# Patient Record
Sex: Male | Born: 1955 | Race: White | Hispanic: No | Marital: Married | Smoking: Never smoker
Health system: Southern US, Community
[De-identification: ages and names within clinical notes are randomized; demographics above are authoritative.]

## PROBLEM LIST (undated history)

## (undated) DIAGNOSIS — M1990 Unspecified osteoarthritis, unspecified site: Secondary | ICD-10-CM

## (undated) DIAGNOSIS — H9190 Unspecified hearing loss, unspecified ear: Secondary | ICD-10-CM

## (undated) DIAGNOSIS — E669 Obesity, unspecified: Secondary | ICD-10-CM

## (undated) DIAGNOSIS — K219 Gastro-esophageal reflux disease without esophagitis: Secondary | ICD-10-CM

## (undated) DIAGNOSIS — H269 Unspecified cataract: Secondary | ICD-10-CM

## (undated) DIAGNOSIS — D649 Anemia, unspecified: Secondary | ICD-10-CM

## (undated) DIAGNOSIS — E785 Hyperlipidemia, unspecified: Secondary | ICD-10-CM

## (undated) DIAGNOSIS — I10 Essential (primary) hypertension: Secondary | ICD-10-CM

## (undated) DIAGNOSIS — C801 Malignant (primary) neoplasm, unspecified: Secondary | ICD-10-CM

## (undated) DIAGNOSIS — R519 Headache, unspecified: Secondary | ICD-10-CM

## (undated) DIAGNOSIS — N200 Calculus of kidney: Secondary | ICD-10-CM

## (undated) DIAGNOSIS — I2510 Atherosclerotic heart disease of native coronary artery without angina pectoris: Secondary | ICD-10-CM

## (undated) HISTORY — DX: Unspecified osteoarthritis, unspecified site: M19.90

## (undated) HISTORY — DX: Hyperlipidemia, unspecified: E78.5

## (undated) HISTORY — DX: Atherosclerotic heart disease of native coronary artery without angina pectoris: I25.10

## (undated) HISTORY — DX: Unspecified cataract: H26.9

## (undated) HISTORY — DX: Obesity, unspecified: E66.9

## (undated) HISTORY — DX: Essential (primary) hypertension: I10

## (undated) HISTORY — PX: HERNIA REPAIR: SHX51

## (undated) HISTORY — DX: Anemia, unspecified: D64.9

## (undated) HISTORY — PX: OTHER SURGICAL HISTORY: SHX169

## (undated) HISTORY — PX: UPPER GASTROINTESTINAL ENDOSCOPY: SHX188

## (undated) HISTORY — PX: CARDIAC CATHETERIZATION: SHX172

## (undated) HISTORY — DX: Calculus of kidney: N20.0

---

## 1988-07-24 DIAGNOSIS — Z87442 Personal history of urinary calculi: Secondary | ICD-10-CM

## 1988-07-24 DIAGNOSIS — N200 Calculus of kidney: Secondary | ICD-10-CM

## 1988-07-24 HISTORY — DX: Personal history of urinary calculi: Z87.442

## 1988-07-24 HISTORY — DX: Calculus of kidney: N20.0

## 1998-07-20 ENCOUNTER — Encounter: Admission: RE | Admit: 1998-07-20 | Discharge: 1998-10-18 | Payer: Self-pay | Admitting: Internal Medicine

## 1998-11-02 ENCOUNTER — Encounter: Admission: RE | Admit: 1998-11-02 | Discharge: 1999-01-31 | Payer: Self-pay | Admitting: Internal Medicine

## 2005-07-24 HISTORY — PX: SHOULDER ARTHROSCOPY: SHX128

## 2006-02-05 ENCOUNTER — Ambulatory Visit: Payer: Self-pay

## 2006-02-14 ENCOUNTER — Ambulatory Visit: Payer: Self-pay | Admitting: Cardiology

## 2006-02-16 ENCOUNTER — Inpatient Hospital Stay (HOSPITAL_BASED_OUTPATIENT_CLINIC_OR_DEPARTMENT_OTHER): Admission: RE | Admit: 2006-02-16 | Discharge: 2006-02-16 | Payer: Self-pay | Admitting: Internal Medicine

## 2006-02-16 ENCOUNTER — Ambulatory Visit: Payer: Self-pay | Admitting: Internal Medicine

## 2006-02-16 ENCOUNTER — Inpatient Hospital Stay (HOSPITAL_COMMUNITY): Admission: AD | Admit: 2006-02-16 | Discharge: 2006-02-24 | Payer: Self-pay | Admitting: Internal Medicine

## 2006-02-19 HISTORY — PX: CORONARY ARTERY BYPASS GRAFT: SHX141

## 2006-03-06 ENCOUNTER — Ambulatory Visit: Payer: Self-pay | Admitting: Cardiology

## 2006-03-13 ENCOUNTER — Ambulatory Visit: Payer: Self-pay | Admitting: Cardiology

## 2006-04-06 ENCOUNTER — Ambulatory Visit: Payer: Self-pay | Admitting: Cardiology

## 2006-04-12 ENCOUNTER — Encounter (HOSPITAL_COMMUNITY): Admission: RE | Admit: 2006-04-12 | Discharge: 2006-07-11 | Payer: Self-pay | Admitting: Cardiology

## 2006-05-18 ENCOUNTER — Ambulatory Visit: Payer: Self-pay | Admitting: Cardiology

## 2006-06-06 ENCOUNTER — Ambulatory Visit: Payer: Self-pay | Admitting: Cardiology

## 2006-07-12 ENCOUNTER — Encounter (HOSPITAL_COMMUNITY): Admission: RE | Admit: 2006-07-12 | Discharge: 2006-07-20 | Payer: Self-pay | Admitting: Cardiology

## 2006-08-10 ENCOUNTER — Ambulatory Visit: Payer: Self-pay | Admitting: Cardiology

## 2006-08-10 LAB — CONVERTED CEMR LAB: Total CK: 98 units/L (ref 7–195)

## 2007-05-31 ENCOUNTER — Ambulatory Visit: Payer: Self-pay | Admitting: Cardiology

## 2007-06-15 IMAGING — CR DG CHEST 1V PORT
1 series · 1 of 1 positions shown · non-contrast
Comparison: 02/16/06.

CLINICAL DATA: CABG.  Chest tubes.
 PORTABLE CHEST -  SINGLE VIEW - 02/19/06:

[view not recorded]
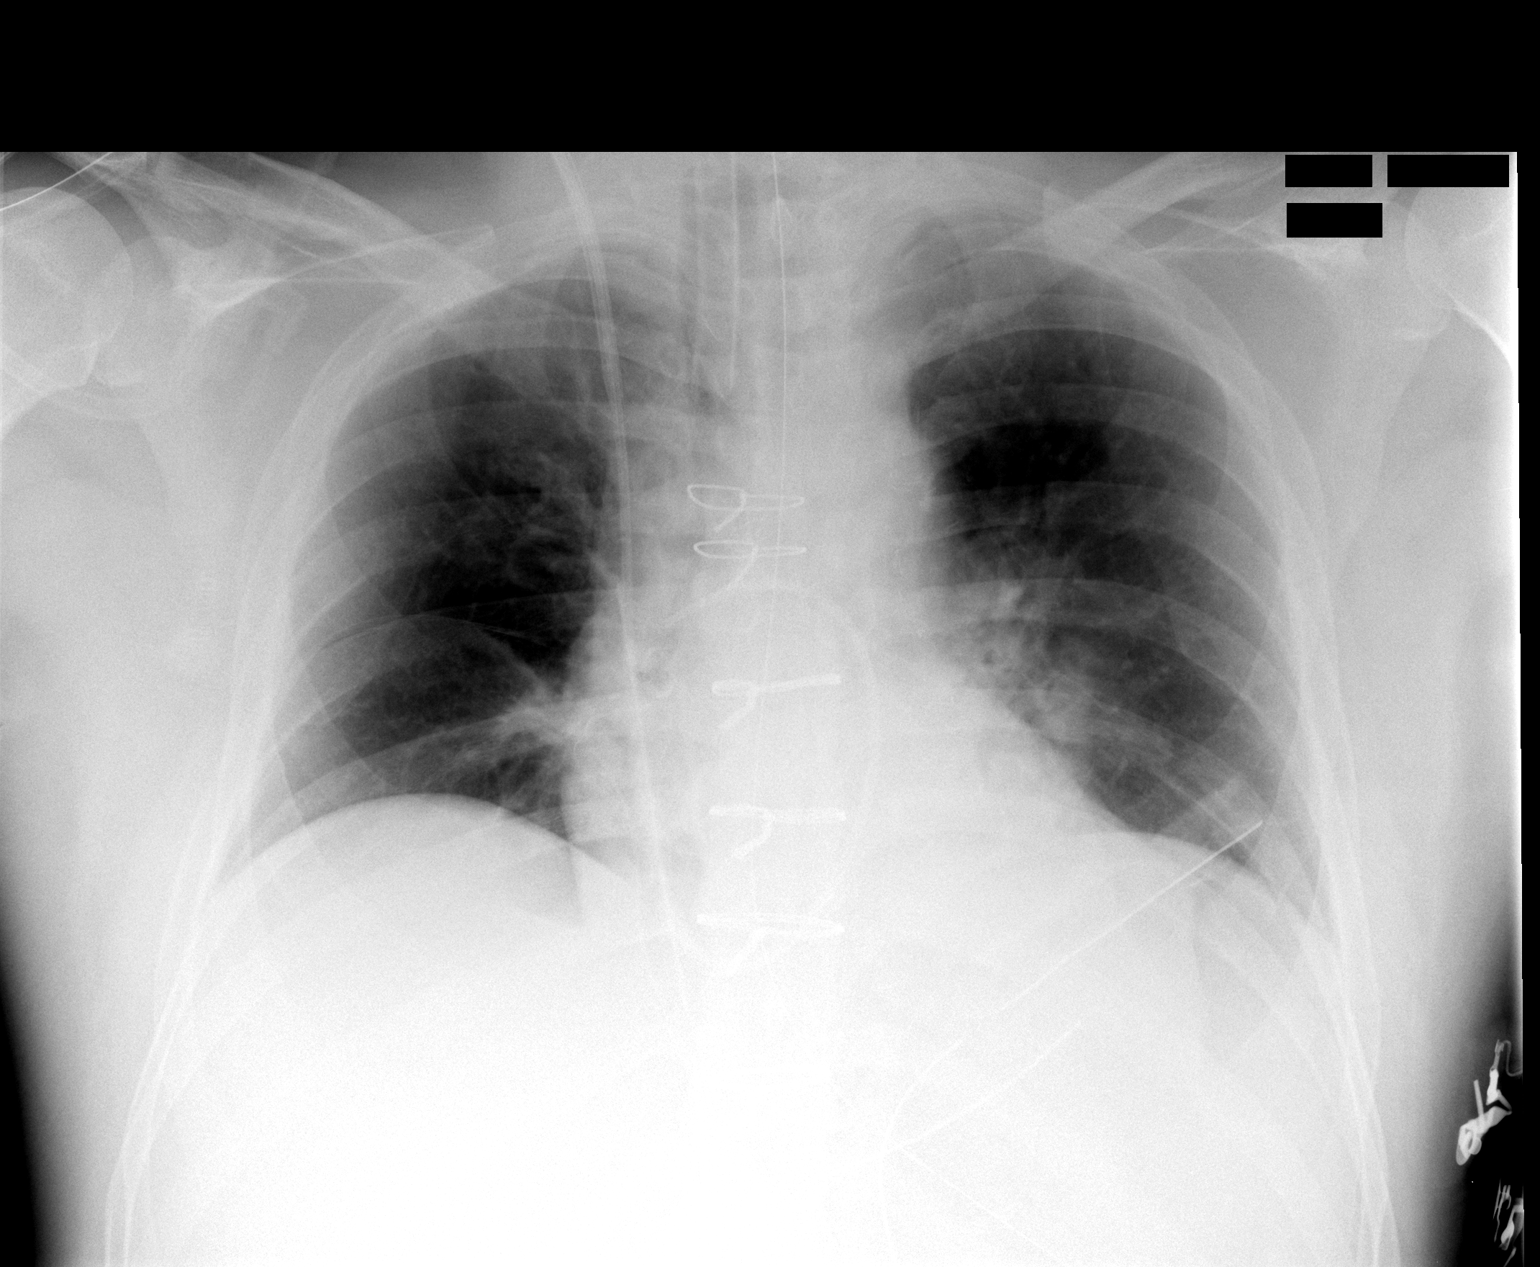

[1 of 1 positions shown; findings below may reference images not displayed]

FINDINGS: An endotracheal tube is in satisfactory position.  A nasogastric tube is followed into the stomach.  The right IJ Swan-Ganz catheter terminates in the right pulmonary artery.  A left chest tube and a mediastinal drain are in place.
 Low lung volumes accentuate the hila.  There is left base atelectasis.  No pneumothorax.
IMPRESSION: Low lung volumes with left base atelectasis.

## 2007-06-16 IMAGING — CR DG CHEST 1V PORT
1 series · 1 of 1 positions shown · non-contrast
Comparison: 02/19/06.

CLINICAL DATA: CABG.
PORTABLE CHEST - 1 VIEW, 02/20/06:

[view not recorded]
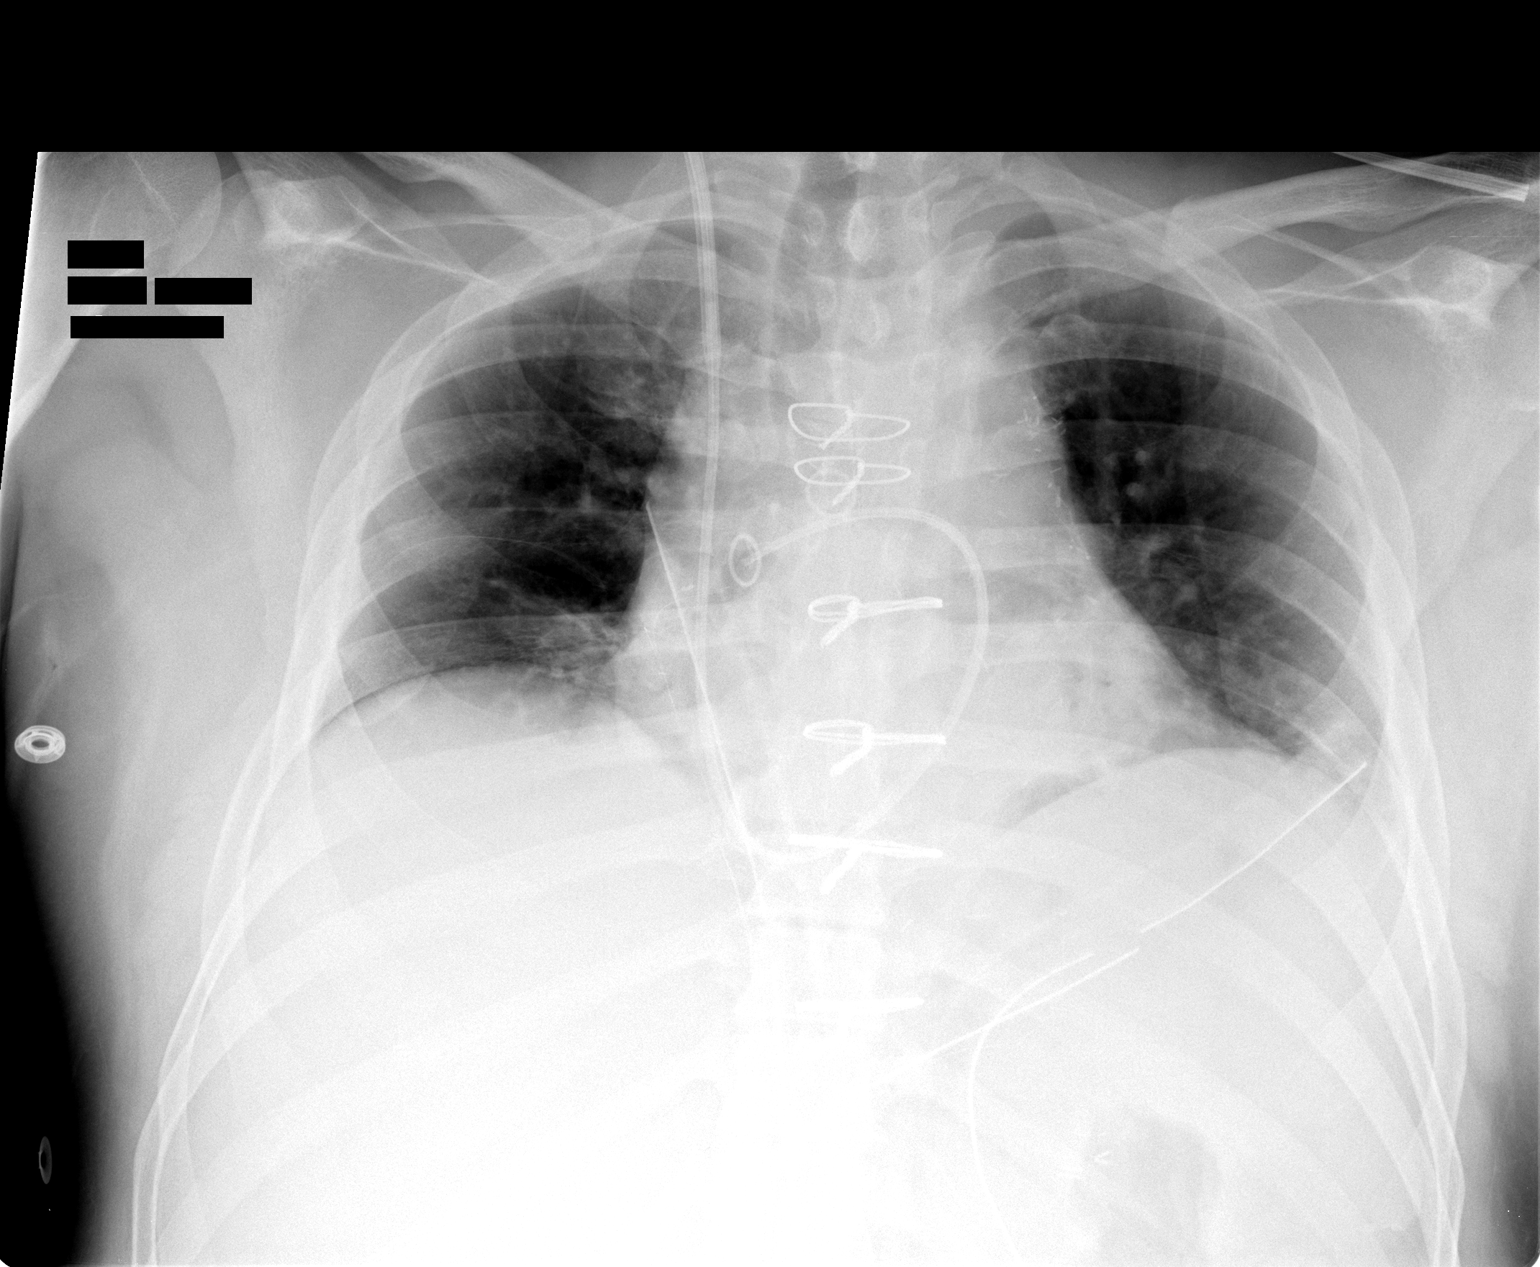

[1 of 1 positions shown; findings below may reference images not displayed]

FINDINGS: Support apparatus including the pulmonary artery catheter and mediastinal/chest tubes remain in place.  Endotracheal tube and nasogastric tube have been removed.  Patient continues to have low lung volumes without large pneumothorax.  The patient is status post median sternotomy.  Heart and mediastinum are stable.
IMPRESSION: Decreased lung volumes status post removal of endotracheal tube and nasogastric tube.

## 2007-06-17 IMAGING — CR DG CHEST 1V PORT
1 series · 1 of 1 positions shown · non-contrast
Comparison: 02/20/2006

CLINICAL DATA: Postop. Coronary artery disease.

[view not recorded]
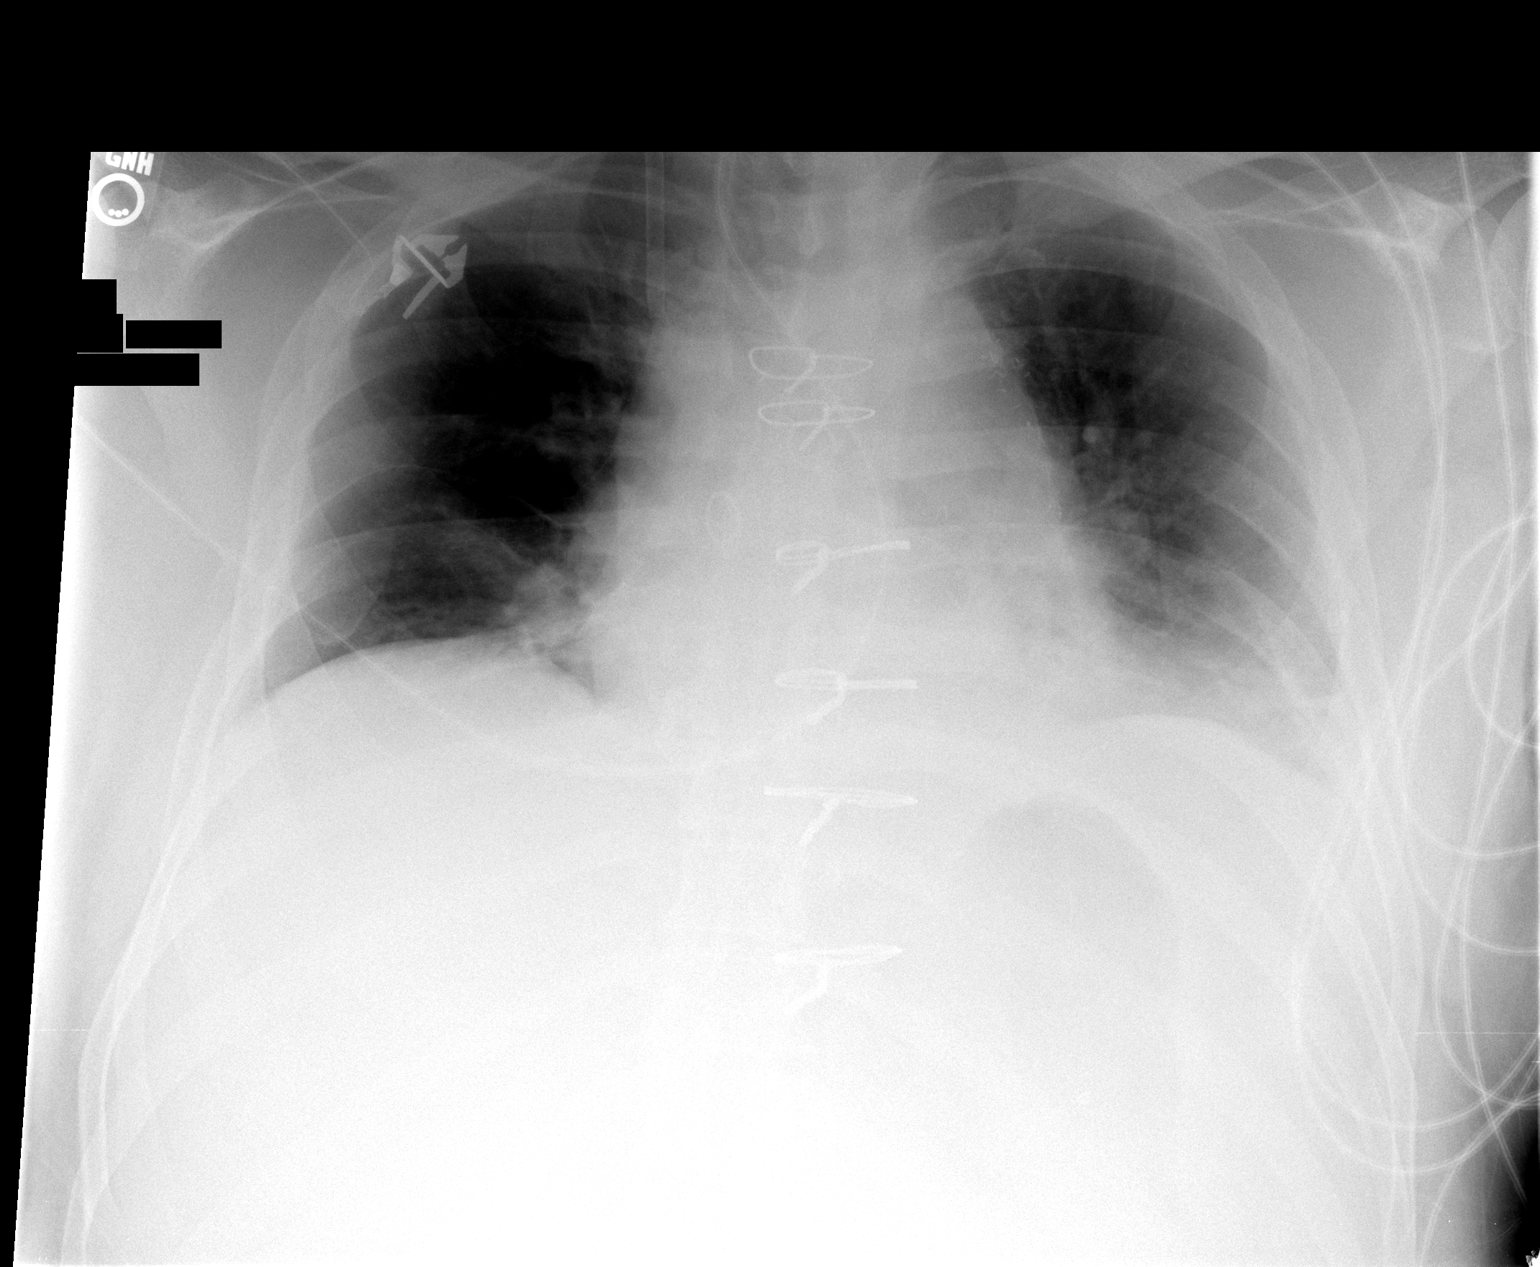

[1 of 1 positions shown; findings below may reference images not displayed]

PORTABLE CHEST - 1 VIEW:

0500 hours. Pulmonary artery catheter, chest tube, mediastinal/pericardial
drains have been removed in the interval. Right IJ sheath remains in place. Lung
volumes are low with bibasilar atelectasis, left greater than right. There may
be tiny left pleural effusion. No evidence for pneumothorax.
IMPRESSION: Low lung volumes with bibasilar atelectasis, left greater than right.

## 2007-07-01 ENCOUNTER — Ambulatory Visit: Payer: Self-pay

## 2008-06-01 ENCOUNTER — Ambulatory Visit: Payer: Self-pay | Admitting: Cardiology

## 2009-02-23 DIAGNOSIS — E669 Obesity, unspecified: Secondary | ICD-10-CM | POA: Insufficient documentation

## 2009-02-23 DIAGNOSIS — E1159 Type 2 diabetes mellitus with other circulatory complications: Secondary | ICD-10-CM | POA: Insufficient documentation

## 2009-02-23 DIAGNOSIS — I152 Hypertension secondary to endocrine disorders: Secondary | ICD-10-CM | POA: Insufficient documentation

## 2009-02-23 DIAGNOSIS — E1169 Type 2 diabetes mellitus with other specified complication: Secondary | ICD-10-CM | POA: Insufficient documentation

## 2009-02-23 DIAGNOSIS — I10 Essential (primary) hypertension: Secondary | ICD-10-CM | POA: Insufficient documentation

## 2009-02-23 DIAGNOSIS — E119 Type 2 diabetes mellitus without complications: Secondary | ICD-10-CM | POA: Insufficient documentation

## 2009-02-23 DIAGNOSIS — E785 Hyperlipidemia, unspecified: Secondary | ICD-10-CM | POA: Insufficient documentation

## 2009-10-14 ENCOUNTER — Ambulatory Visit: Payer: Self-pay | Admitting: Cardiology

## 2009-10-28 ENCOUNTER — Telehealth (INDEPENDENT_AMBULATORY_CARE_PROVIDER_SITE_OTHER): Payer: Self-pay | Admitting: *Deleted

## 2009-11-01 ENCOUNTER — Ambulatory Visit: Payer: Self-pay | Admitting: Internal Medicine

## 2009-11-01 ENCOUNTER — Encounter (HOSPITAL_COMMUNITY): Admission: RE | Admit: 2009-11-01 | Discharge: 2009-12-23 | Payer: Self-pay | Admitting: Cardiology

## 2009-11-01 ENCOUNTER — Ambulatory Visit: Payer: Self-pay

## 2010-08-25 NOTE — Progress Notes (Signed)
Summary: Nuclear Pre-Procedure  Phone Note Outgoing Call Call back at [facility] Phone [phone]   Call placed by: Stanton Kidney, EMT-P,  October 28, 2009 1:11 PM Action Taken: Phone Call Completed Summary of Call: Left message with information on Myoview Information Sheet (see scanned document for details).     Nuclear Med Background Indications for Stress Test: Evaluation for Ischemia, Graft Patency   History: CABG, Heart Catheterization, Myocardial Perfusion Study  History Comments: 7/07 Heart Cath: EF= 45-50%, 3V Dz > CABG x3 12/08 MPS: EF=64%, NL  Symptoms: DOE    Nuclear Pre-Procedure Cardiac Risk Factors: Hypertension, Lipids, NIDDM Height (in): 71

## 2010-08-25 NOTE — Assessment & Plan Note (Signed)
Summary: rov  Medications Added FENOFIBRATE 160 MG TABS (FENOFIBRATE) 1 tab once daily METFORMIN HCL 1000 MG TABS (METFORMIN HCL) 1 tab two times a day CRESTOR 10 MG TABS (ROSUVASTATIN CALCIUM) 1 tab 2 x weekly GLIPIZIDE 10 MG TABS (GLIPIZIDE) 1 tab once daily LISINOPRIL 10 MG TABS (LISINOPRIL) 1 tab once daily FISH OIL 1200 MG CAPS (OMEGA-3 FATTY ACIDS) 1 cap once daily ASPIRIN EC 325 MG TBEC (ASPIRIN) Take one tablet by mouth daily ACTOS 30 MG TABS (PIOGLITAZONE HCL) 1 tab once daily      Allergies Added: NKDA  Visit Type:  rov Primary Provider:  Janace Hoard  CC:  no cardiac complaints today.  History of Present Illness: The patient returns today for followup of his history of coronary artery disease, status post coronary bypass surgery in 2007, hypertension, hyperlipidemia, diabetes, and obesity.  He is now followed by Dr. Alwyn Ren. He gets his blood work checked about every 3 months. He states his lipids have been under good control. His hemoglobin A1c has been little bit high. I do not have any values today.  Other than some dyspnea on exertion, is having no ischemic symptoms. His last stress Myoview was in December of 2008.  He denies orthopnea, PND, palpitations or syncope.  Current Medications (verified): 1)  Fenofibrate 160 Mg Tabs (Fenofibrate) .Marland Kitchen.. 1 Tab Once Daily 2)  Metformin Hcl 1000 Mg Tabs (Metformin Hcl) .Marland Kitchen.. 1 Tab Two Times A Day 3)  Crestor 10 Mg Tabs (Rosuvastatin Calcium) .Marland Kitchen.. 1 Tab 2 X Weekly 4)  Glipizide 10 Mg Tabs (Glipizide) .Marland Kitchen.. 1 Tab Once Daily 5)  Lisinopril 10 Mg Tabs (Lisinopril) .Marland Kitchen.. 1 Tab Once Daily 6)  Fish Oil 1200 Mg Caps (Omega-3 Fatty Acids) .Marland Kitchen.. 1 Cap Once Daily 7)  Aspirin Ec 325 Mg Tbec (Aspirin) .... Take One Tablet By Mouth Daily 8)  Actos 30 Mg Tabs (Pioglitazone Hcl) .Marland Kitchen.. 1 Tab Once Daily  Allergies (verified): No Known Drug Allergies  Past History:  Past Medical History: Last updated: 10/07/2009 CAD, ARTERY BYPASS GRAFT/  02/19/06 (ICD-414.04) OBESITY (ICD-278.00) DM (ICD-250.00) HYPERLIPIDEMIA (ICD-272.4) HYPERTENSION (ICD-401.9)    Past Surgical History: Last updated: 03/16/2009  Hernia operation  Family History: Last updated: 03/16/09 His father died at age 41, he is not sure of what.  Mother  is alive at age 15 with hypertension.  He has no siblings.  Social History: Last updated: 03-16-2009 He lives in [place].  He is married.  He has  three  children.  He works part time in [employer] ge[employer].  Denies any tobacco, alcohol or  drugs and he is not routinely exercising.  Review of Systems       negative other than history of present illness  Vital Signs:  Patient profile:   55 year old male Height:      71 inches Weight:      228 pounds BMI:     31.91 Pulse rate:   79 / minute Pulse rhythm:   irregular BP sitting:   114 / 60  (left arm) Cuff size:   large  Vitals Entered By: Danielle Rankin, CMA (October 14, 2009 11:50 AM)  Physical Exam  General:  obese.  obese.   Head:  normocephalic and atraumatic Eyes:  PERRLA/EOM intact; conjunctiva and lids normal. Chest Kean Gautreau:  no deformities or breast masses noted Lungs:  Clear bilaterally to auscultation and percussion. Heart:  Non-displaced PMI, chest non-tender; regular rate and rhythm, S1, S2 without murmurs, rubs or gallops. Carotid upstroke normal, no  bruit. Normal abdominal aortic size, no bruits. Femorals normal pulses, no bruits. Pedals normal pulses. No edema, no varicosities. Abdomen:  Bowel sounds positive; abdomen soft and non-tender without masses, organomegaly, or hernias noted. No hepatosplenomegaly. Msk:  Back normal, normal gait. Muscle strength and tone normal. Pulses:  pulses normal in all 4 extremities Extremities:  left forearm keloid from radial graft harvestingtrace left pedal edema and trace right pedal edema.  trace left pedal edema.   Neurologic:  Alert and oriented x 3. Skin:  Intact without lesions or rashes. Psych:  Normal  affect.   EKG  Procedure date:  10/14/2009  Findings:      nsinus rhythm, nonspecific changes, early R wave progression  Impression & Recommendations:  Problem # 1:  CAD, ARTERY BYPASS GRAFT/ 02/19/06 (ICD-414.04)  He Has not had any objective assessment of his coronary disease since December of 2008. He is now almost 4 years out from bypass surgery. We'll arrange surveillance exercise Myoview. His updated medication list for this problem includes:    Lisinopril 10 Mg Tabs (Lisinopril) .Marland Kitchen... 1 tab once daily    Aspirin Ec 325 Mg Tbec (Aspirin) .Marland Kitchen... Take one tablet by mouth daily  Orders: EKG w/ Interpretation (93000) Nuclear Stress Test (Nuc Stress Test)  Problem # 2:  OBESITY (ICD-278.00) Assessment: Deteriorated  Problem # 3:  DM (ICD-250.00)  His updated medication list for this problem includes:    Metformin Hcl 1000 Mg Tabs (Metformin hcl) .Marland Kitchen... 1 tab two times a day    Glipizide 10 Mg Tabs (Glipizide) .Marland Kitchen... 1 tab once daily    Lisinopril 10 Mg Tabs (Lisinopril) .Marland Kitchen... 1 tab once daily    Aspirin Ec 325 Mg Tbec (Aspirin) .Marland Kitchen... Take one tablet by mouth daily    Actos 30 Mg Tabs (Pioglitazone hcl) .Marland Kitchen... 1 tab once daily  Problem # 4:  HYPERLIPIDEMIA (ICD-272.4)  His updated medication list for this problem includes:    Fenofibrate 160 Mg Tabs (Fenofibrate) .Marland Kitchen... 1 tab once daily    Crestor 10 Mg Tabs (Rosuvastatin calcium) .Marland Kitchen... 1 tab 2 x weekly  Problem # 5:  HYPERTENSION (ICD-401.9) Assessment: Improved  His updated medication list for this problem includes:    Lisinopril 10 Mg Tabs (Lisinopril) .Marland Kitchen... 1 tab once daily    Aspirin Ec 325 Mg Tbec (Aspirin) .Marland Kitchen... Take one tablet by mouth daily  Patient Instructions: 1)  Your physician recommends that you schedule a follow-up appointment in: 2 YEARS 2)  Your physician recommends that you continue on your current medications as directed. Please refer to the Current Medication list given to you today. 3)  Your  physician has requested that you have an exercise stress myoview.  For further information please visit [email or web address]  Please follow instruction sheet, as given.

## 2010-08-25 NOTE — Assessment & Plan Note (Signed)
Summary: Cardiology Nuclear Study  Nuclear Med Background Indications for Stress Test: Evaluation for Ischemia, Graft Patency   History: CABG, Heart Catheterization, Myocardial Perfusion Study  History Comments: 7/07 Heart Cath: EF= 45-50%, 3V Dz > CABG x3 12/08 MPS: EF=64%, NL  Symptoms: DOE, Palpitations    Nuclear Pre-Procedure Cardiac Risk Factors: Hypertension, Lipids, NIDDM Caffeine/Decaff Intake: None NPO After: 8:00 AM Lungs: clear IV 0.9% NS with Angio Cath: 22g     IV Site: (L) Hand IV Started by: Irean Hong RN Chest Size (in) 44     Height (in): 71 Weight (lb): 226 BMI: 31.63 Tech Comments: The patient pulled hamstring (L) leg last week, changed to lexiscan due to unable to walk treadmill per patient. Patsy Edwards,RN.  Nuclear Med Study 1 or 2 day study:  1 day     Stress Test Type:  Eugenie Birks Reading MD:  Dietrich Pates, MD     Referring MD:  T.Wall Resting Radionuclide:  Technetium 19m Tetrofosmin     Resting Radionuclide Dose:  10.8 mCi  Stress Radionuclide:  Technetium 41m Tetrofosmin     Stress Radionuclide Dose:  33 mCi   Stress Protocol   Lexiscan: 0.4 mg   Stress Test Technologist:  Milana Na EMT-P     Nuclear Technologist:  Domenic Polite CNMT  Rest Procedure  Myocardial perfusion imaging was performed at rest 45 minutes following the intravenous administration of Myoview Technetium 65m Tetrofosmin.  Stress Procedure  The patient received IV Lexiscan 0.4 mg over 15-seconds.  Myoview injected at 30-seconds.  There were non specific changes, sob, and nausea with infusion.  Quantitative spect images were obtained after a 45 minute delay.  QPS Raw Data Images:  soft tissue (diaphragam) underlies heart. Stress Images:  There is normal uptake in all areas. Rest Images:  Normal homogeneous uptake in all areas of the myocardium. Subtraction (SDS):  No evidence of ischemia. Transient Ischemic Dilatation:  1.10  (Normal <1.22)  Lung/Heart Ratio:   .34  (Normal <0.45)  Quantitative Gated Spect Images QGS EDV:  92 ml QGS ESV:  34 ml QGS EF:  63 %   Overall Impression  Exercise Capacity: Lexiscan protocol. BP Response: Normal blood pressure response. Clinical Symptoms: No chest pain ECG Impression: No significant ST segment change suggestive of ischemia. Overall Impression: Normal stress nuclear study.  Appended Document: Cardiology Nuclear Study good. no change in treatment.  Appended Document: Cardiology Nuclear Study PT AWARE./CY

## 2010-12-06 NOTE — Assessment & Plan Note (Signed)
[facility]                            CARDIOLOGY OFFICE NOTE   ADEMIDE, SCHABERG              MRN:          [id number]  DATE:05/31/2007                            DOB:          02-07-1956    The patient returns today for management of the following issues:   1. Coronary artery disease status post coronary artery bypass grafting      x3 February 19, 2006.  He had left internal mammary graft to the LAD,      left radial artery graft to the PDA and a vein graft placed to a      first diagonal.  He has normal left ventricular function.  He      presented with dyspnea on exertion and some chest tightness.  He      has a positive stress Myoview preoperatively.  2. Hypertension.  3. Hyperlipidemia, now on both Lipitor and TriCor for elevated      triglycerides.  4. Obesity.  His weight has increased from 206 to 228.  5. Type 2 diabetes.  His Hemoglobin A1C was last 6.8%.   He is having no angina or ischemic symptoms at present.   CURRENT MEDICATIONS:  Lisinopril 20 mg daily, Metformin 500 mg p.o.  b.i.d., a multivitamin daily, glipizide 10 mg daily, aspirin 325 mg  daily, Lipitor 20 mg p.o. q.h.s., TriCor 145 mg daily, Omega-3 fish oil  daily.   EXAMINATION:  His blood pressure is 120/77, pulse is 86 and regular.  HEENT:  Normocephalic, atraumatic. PERLA, extraocular movements are  intact, sclerae clear. Facial asymmetry is normal.  NECK:  Carotid upstrokes are equal bilaterally without bruits, no JVD.  Thyroid is not enlarged. Trachea is midline.  LUNGS:  Clear.  HEART:  Reveals a soft S1, S2, PMI could not be appreciated.  ABDOMINAL EXAM:  Soft with good bowel sounds.  EXTREMITIES:  Revealed no clubbing, cyanosis or edema. Pulses are  intact.   Electrocardiogram shows normal sinus rhythm with nonspecific ST segment  changes.   ASSESSMENT:  The patient is doing well except for his weight gain. Will  set him up for an exercise stress-test  Myoview as well as renew his  Lipitor.  He will have fasting lipids and LFTs through [facility] now that TriCor has been added to his program.   I will plan on seeing him back in a year.     Azion C. Daleen Squibb, MD, [facility]  Electronically Signed    TCW/MedQ  DD: 05/31/2007  DT: 06/01/2007  Job #: [id number]   cc:   Onalee Hua L. Reed Breech, M.D.  Peyton Najjar, MD

## 2010-12-06 NOTE — Assessment & Plan Note (Signed)
[facility]                            CARDIOLOGY OFFICE NOTE   ELIESER, TETRICK              MRN:          [id number]  DATE:06/01/2008                            DOB:          [birth year]    The patient returns today for further management of his coronary  disease.  Please see my note from May 31, 2007, for complete problem  list.   He is having no symptoms of angina or ischemic equivalents.   Recent blood work showed his lipids to be remarkable for total  cholesterol of 129, triglycerides of 291, HDL of 24, VLDL of 58, LDL 47,  hemoglobin A1c was 7.9%.  He is on a combination of Lipitor and Tricor.  He also takes fish oil.   His weight is unchanged.  He has gained 29 pounds actually 19 pounds  since November 2007.   CURRENT MEDICATIONS:  1. Lisinopril 20 mg a day.  2. Glipizide 10 mg a day.  3. Aspirin 325 mg a day.  4. Lipitor 20 mg nightly.  5. TriCor 145 mg a day.  6. Omega 3 fish oil daily.  7. Metformin 1000 mg b.i.d.   PHYSICAL EXAMINATION:  VITAL SIGNS:  His blood pressure today is 108/66,  his pulse is 83 and regular, his weight is 229.  HEENT:  Normal.  Carotid upstrokes are equal bilaterally without bruits.  No JVD.  Thyroid is not enlarged.  Trachea is midline.  LUNGS:  Clear to  auscultation and percussion.  HEART:  Nondisplaced PMI.  Normal S1 and S2.  ABDOMEN:  Soft.  Good bowel sounds.  No midline bruit.  No hepatomegaly.  EXTREMITIES:  No cyanosis, clubbing, or edema.  Pulses are intact.  NEUROLOGIC:  Intact.   His EKG is completely normal.   ASSESSMENT AND PLAN:  Mr. Debera Lat Asumendi is doing well.  He really  needs to drop about 25 pounds and watch his carbohydrate load.  Hopefully, this numbers would significantly improve including his  hemoglobin A1c.   I have made no changes in medical program.  Again, therapeutic lifestyle  choices are in order.   I will see him back in a year.  At that time,  he will need objective  assessments of his coronary disease with a stress nuclear study.     Alfonse C. Daleen Squibb, MD, [facility]  Electronically Signed    TCW/MedQ  DD: 06/01/2008  DT: 06/02/2008  Job #: [id number]   cc:   Peyton Najjar, MD

## 2010-12-09 NOTE — Op Note (Signed)
NAMEERSEL, [name] NO.:  [id number]   MEDICAL RECORD NO.:  [id number]          PATIENT TYPE:  INP   LOCATION:  2019                         FACILITY:  [facility]   PHYSICIAN:  Salvatore Decent. 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   PROCEDURE:  Median sternotomy, extracorporeal circulation, coronary bypass  grafting x3 (left internal mammary artery to LAD, left radial artery to  posterior descending, saphenous vein graft to first diagonal), endoscopic  vein harvest right leg.   SURGEON:  Salvatore Decent. Dorris Fetch, M.D.   ASSISTANT:  Evelene Croon, M.D.   SECOND ASSISTANT:  Pecola Leisure, P.A.-C.   ANESTHESIA:  General.   FINDINGS:  Good quality conduits.  The PL too small to graft where it was  visible but flow from the posterior descending graft seen in the small  branches of the posterior lateral with flushing of the radial graft, good  quality conduits, good quality targets.   CLINICAL NOTE:  [name] is a 55 year old gentleman who presents  with new onset angina.  He had a positive exercise Myoview study and at  catheterization had severe two vessel disease not favorable for percutaneous  intervention.  The patient was offered the option of coronary bypass  grafting for revascularization. The indications, risks, benefits and  alternatives were discussed in detail with the patient who understood and  accepted the risks and agreed to proceed. We did discuss using the left  radial artery for a conduit given his young age. 55  He is not a good candidate  for bilateral mammaries due to his diabetes. The patient accepted the  additional risk of radial artery harvest.   OPERATIVE  NOTE:  [name] was brought to the preop holding area  on February 19, 2006.  There, the anesthesia service placed lines for monitoring  arterial, central venous, and pulmonary arterial pressure.  ECG leads were  placed for continuous telemetry.  Intravenous antibiotics were administered.  The patient was taken to the operating room, anesthetized, and intubated.  A  Foley catheter was placed.  The chest, abdomen, legs, and left arm were  prepped and draped in the usual fashion.  An incision was made in the medial  aspect of the right leg at the level of the knee. The greater saphenous vein  was identified and a short segment was harvested from the knee to the mid  thigh.  The saphenous vein was of good quality.  Simultaneously, an incision  made over the radial artery at the volar aspect of the left wrist.  A short  incision was made initially, this was carried through the skin and  subcutaneous tissue.  The radial artery was mobilized using the harmonic  scalpel. A soft bulldog clamp was placed across the radial artery.  Initially, there was diminished pulse distally but with applying papaverine  there was an excellent pulse distally confirming  the preoperative Allen's  test which had been normal on two occasions preoperatively. The incision was  extended to just below the antecubital fossa and the radial artery was  harvested using open technique using the harmonic scalpel.  The radial  artery was a good quality conduit.  5000 units of heparin was administered  during the harvest of the vessels. After exposing and mobilizing the radial  artery along the length of the incision, the radial artery was divided  distally. There was good back bleeding from the distal stump.  It was suture  ligated with a 4-0 Prolene suture.  The vessel also was divided proximally  and the stump, likewise, was ligated with a 4-0 Prolene suture.   The leg and radial artery incisions were closed  simultaneously while proceed  with the median sternotomy. A median sternotomy was performed and the left  internal mammary artery was harvested using standard technique. The mammary  artery was a good quality vessel.  The remainder of the full heparin dose  was given prior to opening the pericardium.   The pericardium was opened.  The ascending aorta was inspected and there was  no evidence of atherosclerotic disease.  The aorta was cannulated via  concentric 2-0 Ethibond non-pledgeted pursestring sutures.  A dual stage  venous cannula was placed via pursestring suture in the right atrial  appendage.  Cardiopulmonary bypass was instituted and the patient was cooled  to 32 degrees Celsius.  The coronary arteries were inspected and anastomotic  sites were chosen.  The posterior lateral branch was too small to graft  where it was visible.  It bifurcated into two very small terminal vessels.  There was disease in the right coronary artery proximal to the bifurcation  of the posterior descending but no significant disease distally. The  conduits were inspected and cut to length. A foam pad was placed in the  pericardium to protect the left phrenic nerve.  A temperature probe was  placed in the myocardial septum.  The cardioplegic cannula was placed in the  ascending aorta.   The aorta was crossclamped, the left ventricle was emptied via the aortic  root vent.  Cardiac arrest was achieved with a combination of cold antegrade  blood cardioplegia and topical iced saline.  After achieving a complete  diastolic arrest and adequate myocardial septal cooling, the following  distal anastomoses were performed.   First, a reverse saphenous vein graft was placed end-to-side to the first  diagonal branch of the LAD.  This was a 1.5 mm target and the vein graft was  of good quality.  The anastomosis was performed with a running 7-0 Prolene suture.  There was excellent flow through this graft.   Cardioplegia was  administered.  There was good hemostasis.   Next, the left radial artery was spatulated and its distal end was  anastomosed end-to-side to the posterior descending.  The posterior  descending was a 1.5 mm vessel.  There was some plaquing in the vessel but a  probe did pass distally.  The anastomosis was performed with a running 8-0  Prolene suture.  It probed easily proximally and distally at its completion.  The graft was carefully flushed with heparinized saline and there was flow  visible both within the posterior descending as well as in the terminal  small branches of the posterior lateral branch.   Next, the left internal mammary artery was brought through a window in the  pericardium.  The distal end was  spatulated.  It was anastomosed end-to-side  to the distal LAD.  The LAD was a 1.5 mm good-quality target.  The mammary  was a good quality conduit.  The anastomosis was performed with a running 8-  0 Prolene suture. At the completion of the mammary to LAD anastomosis, a  probe passed easily in all directions. The bulldog clamp was briefly  removed.  Immediate and rapid septal rewarming was noted.  The bulldog clamp  was replaced.  The mammary pedicle was tacked to the epicardial surface of  the heart with 6-0 Prolene sutures.   Additional cardioplegia was administered via the aortic root.  The radial  artery and vein graft were cut to length.  The proximal anastomoses then  were performed.  The radial artery was performed first with a running 7-0  Prolene suture.  Next, the saphenous vein proximal was performed with  running 6-0 Prolene suture. At the completion of this anastomosis, the  patient was placed in Trendelenburg position.  The bulldog clamp was again  removed from the left mammary artery.  Lidocaine was administered.  The  aortic root was de-aired and the aortic crossclamp was removed.  The total  crossclamp time was 58 minutes.   While the patient  was being rewarmed, all proximal and distal anastomoses  were inspected for hemostasis.  Epicardial pacing wires were placed on the  right ventricle and right atrium.  When the patient had rewarmed to a core  temperature of 37 degrees Celsius, he was weaned from cardiopulmonary bypass  without difficulty.  The total bypass time was 106 minutes.  The initial  cardiac index was greater than 2 liters per minute per meter squared and the  patient remained hemodynamically stable throughout the post bypass period.   A test dose of protamine was administered and was well tolerated.  The  atrial and aortic cannulae were removed.  The remainder of the protamine was  administered without incident.  The chest was irrigated with 1 liter of  normal saline containing 1 gram of vancomycin.  Hemostasis was achieved.  A  left pleural and two mediastinal chest tubes were placed through separate subcostal incisions.  The pericardium was reapproximated with interrupted 3-  0 silk sutures.  This came together easily without tension. The sternum was  closed with heavy gauge stainless steel wires.  The remainder of the  incision was closed in standard fashion.  Subcuticular closure was used for  all skin incisions.  All sponge, needle and instrument counts were correct  at the end of the procedure.  The patient was taken from the operating room  to the surgical intensive care unit in critical but stable condition.           ______________________________  Salvatore Decent Dorris Fetch, M.D.     SCH/MEDQ  D:  02/21/2006  T:  02/21/2006  Job:  [id number]   cc:   Arvilla Meres, M.D. [facility] D. Riley Kill, M.D. [facility]  Maisie Fus C. Wall, M.D.  Kinnie Scales. Reed Breech, M.D.

## 2010-12-09 NOTE — Discharge Summary (Signed)
NAMEARMISTEAD, SULT NO.:  [id number]   MEDICAL RECORD NO.:  [id number]          PATIENT TYPE:  INP   LOCATION:  2019                         FACILITY:  [facility]   PHYSICIAN:  Salvatore Decent. Dorris Fetch, M.D.DATE OF BIRTH:  Jul 19, 1956   DATE OF ADMISSION:  02/16/2006  DATE OF DISCHARGE:                                 DISCHARGE SUMMARY   ADMISSION DIAGNOSIS:  Coronary artery disease.   PAST MEDICAL HISTORY AND DISCHARGE DIAGNOSES:  1.  Hypertension.  2.  Hyperlipidemia.  3.  Type 2 diabetes mellitus.  4.  Hernia operation when he was approximately 55 years old.  5.  Obesity.  6.  Coronary artery disease, status post coronary artery bypass grafting x3.   ALLERGIES:  NO KNOWN DRUG ALLERGIES.   BRIEF HISTORY:  The patient is a 55 year old Caucasian male with a previous  history of coronary artery disease who reported to his primary care  physician and then to his cardiologist with complaint of a 24-month history  of exertional chest tightness associated with mild shortness of breath.  After evaluation by the cardiologist, he underwent an exercise Myoview on  February 05, 2006, which showed significant ST-segment changes, as well as  inferior and inferobasal ischemia.  He was then subsequently set up for a  cardiac cath.   HOSPITAL COURSE:  Patient was admitted and taken for cardiac cath on February 16, 2006, by Dr. Gala Romney.  The patient was found to have severe 2-vessel  coronary artery disease with a good left ventricular function.  He was not a  candidate for percutaneous intervention; therefore, Dr. Charlett Lango  of the CVTS Service was consulted regarding surgical revascularization.  Dr.  Dorris Fetch evaluated the patient on February 16, 2006, and it was his opinion  that patient should proceed with coronary artery bypass grafting.  The  patient remained in stable condition on routine hospital care until his  surgery could be scheduled.   The patient was taken to  the OR on February 19, 2006, for coronary artery bypass  grafting x3.  Endoscopic vessel harvesting was performed on the right thigh  from the knee to the mid thigh only.  The left internal mammary artery was  grafted to the LAD, the left radial artery was grafted to the PDA, and  saphenous vein was grafted to the first diagonal.  The patient tolerated the  procedure, was hemodynamically stable immediately postoperatively.  The  patient was transferred from the OR to the SICU in stable condition.  Patient was extubated without complication.  Woke up from anesthesia  neurologically intact.   Patient's postoperative was progressed as expected.  On postoperative day  one, his only complaint was of incisional pain.  He was started on Imdur  secondary to his radial artery graft.  All the patient's lines and chest  tubes were discontinued in a routine manner, and he tolerated this well.  His diabetes mellitus has been adequately maintained throughout the  postoperative course with Lantus, sliding scale, and his p.o. medications  were restarted.  These will need to be followed closely as an outpatient by  his primary care physician.   The  patient has been volume overloaded postoperatively and is being diuresed  accordingly.  He began cardiac rehab on postoperative day 2 and has  continued to increase his tolerance to a satisfactory level at this time.  On postoperative day 3, the patient is without complaint.  He ran a low-  grade temperature early in the a.m. and this will be monitored over night.  His vital signs are, otherwise, stable.  He is intermittently in a sinus  rhythm and a sinus tachycardia with a maximum rate of 110.  On physical  exam, the incisions are clean, dry and intact with no erythema.  Cardiac is  regular rate and rhythm.  The lungs revealed decreased breath sounds in the  bases.  There is no edema present in the bilateral lower extremities and the  left arm and hand are  adequately perfused.  The patient is in stable  condition at this time and as long as he continues to progress in the  current manner and remains afebrile overnight, he will be ready for  discharge within the next 1 to 2 days pending morning round reevaluation.   LABS:  CBC and CMP on February 22, 2006:  White count 9.1, hemoglobin 10.4,  hematocrit 30.5, platelets 140, sodium 136, potassium 3.7, BUN 12,  creatinine 1.0, glucose 201.   CONDITION ON DISCHARGE:  Is improved.   INSTRUCTIONS:  1.  Medications:      1.  Aspirin 325 mg p.o. q. day.      2.  Toprol-XL 25 mg q. day.      3.  Lisinopril 20 mg q. day.      4.  Lipitor 80 mg q. day.      5.  Imdur 30 mg q. day.      6.  Lasix 40 mg q. day x5 days.      7.  K-Dur 20 mEq q. day x5 days.      8.  Metformin 1,000 mg q. day.          1.  Tylox one to two q.4 to 6 hours p.r.n. pain.  2.  Activity:  No driving and no lifting of more than 10 pounds x3 weeks,      and the patient should continue the daily running and walking exercises.  3.  Diet:  Low-salt, low-fat, with carbohydrate-modified, medium-calorie      restrictions.  4.  Wound care:  The patient may shower daily and clean his incisions with      soap and water.  If wound problems arise patient should contact the CVTS      office.  5.  Followup appointment with Dr. Daleen Squibb 2 weeks after discharge.  [facility] will be responsible for establishing the date and time of      this appointment.  6.  With Dr. Dorris Fetch on March 19, 2006, at 12:30 p.m.  The patient will      be instructed to bring the chest x-ray from the appointment with Dr.      Daleen Squibb to the appointment with Dr. Dorris Fetch.      Pecola Leisure, PA    ______________________________  Salvatore Decent Dorris Fetch, M.D.    AY/MEDQ  D:  02/22/2006  T:  02/22/2006  Job:  [id number]   cc:   Kamarion C. Wall, M.D.  Kinnie Scales. Reed Breech, M.D.

## 2010-12-09 NOTE — Assessment & Plan Note (Signed)
[facility]                              CARDIOLOGY OFFICE NOTE   SANJITH, SIWEK              MRN:          [id number]  DATE:02/14/2006                            DOB:          Oct 09, 1955    PRIMARY CARE PHYSICIAN:  Kinnie Scales. Reed Breech, M.D.   PRIMARY CARDIOLOGIST:  Jesse Sans. Wall, MD, FACC   PATIENT PROFILE:  This is a 55 year old white male with recent history of  exertional angina and abnormal functional study presents for pre cath  workup.   PROBLEMS:  1.  Onset angina.  1a.  On 02/05/2006, exercise Myoview, exercise time 11 minutes, maximal  heart rate of 133 beats-per-minute (78%).  Mets 13.2.  EKG showed  significant ST abnormalities consistent with ischemia and he also had  abnormal perfusion imaging revealing mild to moderate inferobasilar  ischemia.  1.  Hypertension.  2.  Hyperlipidemia.  3.  Type 2 diabetes mellitus diagnosed approximately five years ago.  4.  History of hernia operation when he was 55 years old.  5.  Obesity.   HISTORY OF PRESENT ILLNESS:  This is a 55 year old white male with no prior  history of CAD who reports a two month history of exertional 3/10 chest  tightness especially when walking up inclines associated with mild shortness  of breath without nausea or diaphoresis, lasting less than 5 minutes and  resolving with rest.  He saw his primary care physician, Dr. Reed Breech for  routine physical earlier this year and underwent an exercise Myoview on February 05, 2006 which, despite good exercise tolerance, showed significant ST  segment changes as well as inferior and inferobasal ischemia.  He was  subsequently set up for today's appointment so that he may undergo left  heart cardiac catheterization scheduled for this Friday.  He currently  denies any PND or orthopnea, dizziness, syncope or early satiety.   ALLERGIES:  NO KNOWN DRUG ALLERGIES.   HOME MEDICINES:  He is unclear on the doses but he is  taking Lisinopril,  metformin, aspirin 800 mg daily, multivitamin one daily and then supplements  for potassium, calcium, folic acid and cod liver oil.   FAMILY HISTORY:  His father died at age 18, he is not sure of what.  Mother  is alive at age 71 with hypertension.  He has no siblings.   SOCIAL HISTORY:  He lives in [place].  He is married.  He has  three  children.  He works part time in [employer] ge[employer].  Denies any tobacco, alcohol or  drugs and he is not routinely exercising.   REVIEW OF SYSTEMS:  Positive for exertional chest discomfort and shortness  of breath.  He has intermittent bouts of diarrhea, otherwise all systems  reviewed and negative.   PHYSICAL EXAMINATION:  VITAL SIGNS:  Blood pressure is 138/76, heart rate is  62, respirations 18.  He is 212 pounds, pleasant white male in no acute  distress, awake, alert, and oriented x3.  NECK:  Normal carotid upstrokes, no bruits or JVD.  LUNGS: Respirations are regular and unlabored.  Clear to auscultation.  CARDIAC:  Regular S1 and S2, no S3,  S4 or murmurs.  ABDOMEN:  Bowel sounds, soft and nontender, nondistended.  Bowel sounds  present x4.  EXTREMITIES:  Warm and dry, pink.  No clubbing, cyanosis or edema.  Dorsalis  pedis and posterior tibial pulses 2+ and equal bilaterally.  No femoral  bruits noted.   ACCESSORY CLINICAL FINDINGS:  CBC, BMET, PT/PTT and chest x-ray are pending.   ASSESSMENT AND PLAN:  1.  Unstable angina.  The patient had an abnormal functional study earlier      this month and is scheduled for cardiac catheterization on Friday.  He      remains on aspirin therapy.  Given his history of diabetes, he should      likely be initiated on Statin therapy as well.  We can discuss  this      further following his catheterization.  2.  Hypertension.  This is reasonably well controlled.  He was recently      started on lisinopril.  3.  Type 2 diabetes mellitus followed by his primary care physician.                                 Nicolasa Ducking, ANP    CB/MedQ  DD:  02/14/2006  DT:  02/14/2006  Job #:  [id number]

## 2010-12-09 NOTE — Cardiovascular Report (Signed)
NAME:  [name] Love, [name] Love NO.:  [id number]   MEDICAL RECORD NO.:  [id number]          PATIENT TYPE:  OIB   LOCATION:  1963                         FACILITY:  [facility]   PHYSICIAN:  Arvilla Meres, M.D. LHCDATE OF BIRTH:  1956-05-19   DATE OF PROCEDURE:  02/16/2006  DATE OF DISCHARGE:                              CARDIAC CATHETERIZATION   PRIMARY CARE PHYSICIAN:  Dr. Berton Bon.   CARDIOLOGIST:  Jesse Sans. Wall, M.D.   PATIENT INDICATIONS:  [name] Love is a 55 year old man with a  history of diabetes, hypertension and hyperlipidemia, who has a several-  month history of exertional angina with no resting symptoms.  He had a  Cardiolite which was suggestive of some inferior basilar hypokinesis and  ischemia.  He is thus referred for diagnostic catheterization in the  outpatient lab.   PROCEDURES PERFORMED:  1.  Selective coronary angiography.  2.  Left heart catheterization.  3.  Left ventriculogram.   DESCRIPTION OF PROCEDURE:  The risks and benefits of the catheterization  were explained.  Consent was signed and placed in the chart.  A 4-French  arterial sheath was placed in right femoral artery using a modified  Seldinger technique.  Standard catheters including JL-4, 3-D RC and angled  pigtail were used for the procedure.  All catheter exchanges were made over  wire.  There were no apparent complications.   Central aortic pressure 123/74 with mean of 95.  LV 107/3 with an EDP of 0.   The left main had distal 20% stenosis.   LAD was a long vessel wrapping the apex.  It gave off a single moderate-  sized diagonal in the proximal portion.  In the proximal portion of the LAD  there was 80% focal stenosis followed by 30-40% tubular stenosis.  There was  a mid 40% stenosis.  In the ostium of the first diagonal there was a 60-70%  focal stenosis with a 30% stenosis in the body of the diagonal.   Left circumflex a moderate-sized system and gave off a  small to moderate  ramus and a large branching OM-1.  There was a 60% lesion in the ostium of  the ramus, otherwise no significant angiographic disease.   Right coronary artery was a large, dominant system with a significant amount  of disease.  There was a 70-80% lesion proximally, a 40% the lesion in the  midsection.  In the distal RCA around the bend there was a high-grade 99%  lesion followed by an 80% lesion at the takeoff of the PDA.  There was  evidence of competitive flow from collaterals to the PDA and the second of  the three posterolaterals.   Left ventriculogram done in the RAO position showed an EF of 45-50% with  mild inferior basilar hypokinesis and mild anterior hypokinesis.   ASSESSMENT:  1.  Three-vessel coronary artery disease with high-grade lesions in the      proximal left anterior descending artery and multiple lesions in the      right coronary.  2.  Low normal ejection fraction.  3.  Stable exertional angina.   PLAN/DISCUSSION:  [name] Love has several lesions.  Obviously the  culprit lesion seems to be the distal right coronary artery.  Although this  is quite high-grade, he does have evidence of some collateral flow with  stable symptoms.  I will review the case with Dr. Riley Kill for possible PCI  of the RCA and the LAD in a staged procedure.  We will also discuss the  timing, whether not we need to admit him today or can bring him back early  next week.  I think either approach would be reasonable given his stable  symptoms and his evidence of collateralization.      Arvilla Meres, M.D. [facility]  Electronically Signed     DB/MEDQ  D:  02/16/2006  T:  02/16/2006  Job:  [id number]

## 2010-12-09 NOTE — Assessment & Plan Note (Signed)
[facility]                              CARDIOLOGY OFFICE NOTE   Love, [name]              MRN:          [id number]  DATE:03/06/2006                            DOB:          24-Apr-1956    [name] returns today for further management of his coronary artery  disease.   His problem list includes:  1. Two vessel coronary artery disease, status post coronary bypass      grafting times three, February 19, 2006.  At that time he had a left      internal mammary graft placed to the LAD, left radial artery placed to      the PDA, and a vein graft placed to a first diagonal.  He had normal      left ventricular systolic function prior to surgery.  2. Hypertension.  3. Hyperlipidemia with Lipitor being a new drug.  4. Mild obesity.   He has done remarkably well.  Other than some sternal tenderness, he has no  complaints.   His current meds are:  1. Lisinopril 20 mg a day.  2. Metformin 500 b.i.d.  3. Multivitamin q. day.  4. Potassium over-the-counter.  5. Isosorbide 30 mg a day.  6. Glipizide 10 mg a day.  7. Lipitor 80 mg a day.  8. Metoprolol 25 mg a day.  9. Aspirin 325 a day.   His blood pressure today is 130/64, his pulse is 80 and regular.  EKG is  normal except for some nonspecific ST segment T-wave changes.  Weight is  207.  He is in no acute distress.  Skin is warm and dry.  Sclerae are clear.  Extraocular movements intact.  PERRLA.  Facial symmetry  is normal.  Dentition is satisfactory.  Carotid upstrokes are equal bilaterally without bruits.  No JVD.  Thyroid is  not enlarged.  Trachea is midline.  Lungs are clear to auscultation.  Sternotomy site is stable.  Heart reveals a regular rate and rhythm without murmurs, rubs or gallops.  Abdominal exam is soft with good bowel sounds.  There is no midline bruit.  There is no hepatomegaly.  Extremities reveals no cyanosis, clubbing or edema.  Pulses are intact.   Chest  x-ray is pending.   ASSESSMENT AND PLAN:  [name] is doing remarkably well.  He seems to be  very compliant and willing to do everything he can to practice strict TLC.   PLAN:  1. Discontinue metoprolol.  2. Discontinue isosorbide.  3. Discontinue over-the-counter potassium.  4. Follow up blood work, including lipids, in four weeks.  5. Follow up with me in two months.                               Kota C. Daleen Squibb, MD, [facility]    TCW/MedQ  DD:  03/06/2006  DT:  03/06/2006  Job #:  [id number]   cc:   Onalee Hua L. Reed Breech, MD  Salvatore Decent Dorris Fetch, MD

## 2010-12-09 NOTE — Assessment & Plan Note (Signed)
[facility]                              CARDIOLOGY OFFICE NOTE   TUSHAR, ENNS              MRN:          [id number]  DATE:06/06/2006                            DOB:          October 08, 1955    Patient returns today for further management of his coronary artery disease.   PROBLEM LIST:  1. Coronary artery disease.  He had a positive stress nuclear study with      exertional angina on February 05, 2006.  Cardiac catheterization showed      severe three vessel disease.  He underwent coronary artery bypass      grafting by Dr. Andrey Spearman.  The mammary was placed at LAD, left      radial artery to the PDA, and a vein graft to a first diagonal.  He had      normal left ventricular function.  2. Hypertension.  3. Diabetes.  4. Obesity.  5. Mixed hyperlipidemia.   He is in cardiac rehab.  He has become very serious about his health.  He  has lost about 15 pounds.  His last lipid showed a total cholesterol of 107,  HDL 38, LDL 44, triglycerides 188 with normal LFTs.  We reduced his Lipitor  from 40 to 20.  He is due blood work again in three months.   He is due a followup with the primary care at [facility] for his  diabetes and blood work in general.   CURRENT MEDICATIONS:  1. Lisinopril 20 mg a day.  2. Metformin 500 mg b.i.d.  3. Multivitamin daily.  4. Glipizide 10 mg a day.  5. Aspirin 325 a day.  6. Lipitor 20 mg p.o. nightly.   PHYSICAL EXAMINATION:  VITAL SIGNS:  His blood pressure today is 102/68 in  both arms.  Pulse is 82 and regular.  His weight is 206.  GENERAL:  He is slightly pale.  HEENT:  Normocephalic and atraumatic.  PERRLA.  Extraocular movements are  intact.  Sclerae are clear.  Facial asymmetry is normal.  NECK:  Carotids are equal bilaterally without bruits.  There is no JVD.  Thyroid is not enlarged.  Trachea is midline.  LUNGS:  Clear.  HEART:  Regular rate and rhythm. There is no gallop ore  rub.  ABDOMEN:  Soft with good bowel sounds.  No midline bruit.  There is no  hepatomegaly.  EXTREMITIES:  No clubbing, cyanosis or edema.  Pulses are intact.  NEUROLOGIC:  Intact.   Electrocardiogram today is essentially normal.   ASSESSMENT/PLAN:  Mr. Delcastillo is doing well, status post coronary  artery bypass grafting.  He has about another month is cardiac rehab.  He is  due follow-up blood work, including a comprehensive metabolic panel, lipids,  hemoglobin A1C, which I will turn over to [facility].  He has an appointment with them in about a week or two.   We will plan on seeing him back again in July, 2008.   All of his questions were answered.  I spent about 15-20 minutes talking to  him about medications and continued rehab program.  Chauncy C. Daleen Squibb, MD, [facility]  Electronically Signed    TCW/MedQ  DD: 06/06/2006  DT: 06/06/2006  Job #: [id number]   cc:   Onalee Hua L. Reed Breech, M.D.

## 2011-07-13 ENCOUNTER — Encounter: Payer: Self-pay | Admitting: *Deleted

## 2011-07-14 ENCOUNTER — Ambulatory Visit (INDEPENDENT_AMBULATORY_CARE_PROVIDER_SITE_OTHER): Payer: BC Managed Care – PPO | Admitting: Cardiology

## 2011-07-14 ENCOUNTER — Encounter: Payer: Self-pay | Admitting: Cardiology

## 2011-07-14 DIAGNOSIS — E1165 Type 2 diabetes mellitus with hyperglycemia: Secondary | ICD-10-CM

## 2011-07-14 DIAGNOSIS — Z951 Presence of aortocoronary bypass graft: Secondary | ICD-10-CM

## 2011-07-14 DIAGNOSIS — IMO0002 Reserved for concepts with insufficient information to code with codable children: Secondary | ICD-10-CM

## 2011-07-14 DIAGNOSIS — I253 Aneurysm of heart: Secondary | ICD-10-CM

## 2011-07-14 DIAGNOSIS — E785 Hyperlipidemia, unspecified: Secondary | ICD-10-CM

## 2011-07-14 DIAGNOSIS — I2510 Atherosclerotic heart disease of native coronary artery without angina pectoris: Secondary | ICD-10-CM

## 2011-07-14 DIAGNOSIS — E118 Type 2 diabetes mellitus with unspecified complications: Secondary | ICD-10-CM

## 2011-07-14 DIAGNOSIS — E669 Obesity, unspecified: Secondary | ICD-10-CM

## 2011-07-14 DIAGNOSIS — Z9889 Other specified postprocedural states: Secondary | ICD-10-CM

## 2011-07-14 NOTE — Assessment & Plan Note (Signed)
Major problem. I strongly encouraged him to lose 25-30 pounds at least. He also needs to exercise on a regular basis.

## 2011-07-14 NOTE — Assessment & Plan Note (Signed)
Stable. We'll not order any further surveillance nuclear studies. I explained to him that with diabetes, predicted poorly controlled, his risk is greater of having progressive disease or silent occlusion. I've encouraged him to lose weight, tighten up his blood sugar control, and exercise. I will see him back in the office in one year.

## 2011-07-14 NOTE — Assessment & Plan Note (Signed)
I reviewed his numbers with him. His triglycerides are high and his HDL is low and his VLDL reinforces dietary indiscretion. Strongly encouraged him to tighten up her blood sugar control exercise and lose weight.

## 2011-07-14 NOTE — Patient Instructions (Signed)
Your physician recommends that you schedule a follow-up appointment in: 1 year  

## 2011-07-14 NOTE — Progress Notes (Signed)
HPI The patient comes in today for evaluation and management of his coronary artery disease. He's had previous bypass surgery. He is at high risk for having progressive disease and silent occlusion. His blood sugars not well controlled, he's gained weight, and his lipids are suboptimal with his total cholesterol being 185 and LDL being 55, however his HDL is 33 his triglycerides were 277. His blood pressure seems to be under good control. He does not exercise a regular basis.  He denies any chest pain or angina. He denies orthopnea, PND or edema. He denies palpitations, presyncope or syncope.  He's very upset that he got billed $2200 for his stress Myoview. Our office has spent hours working on this and had still reduced by half. He said this was only for surveillance. I explained to him that we do this for diabetics because of silent ischemia or occlusion.  Past Medical History  Diagnosis Date  . Coronary artery disease     post CABG x3 in 2007  . Diabetes mellitus   . Hyperlipidemia   . Hypertension   . Obesity     Current Outpatient Prescriptions  Medication Sig Dispense Refill  . aspirin 325 MG tablet Take 325 mg by mouth daily.        . fenofibrate 160 MG tablet Take 160 mg by mouth daily.        Marland Kitchen glipiZIDE (GLUCOTROL XL) 10 MG 24 hr tablet Take 10 mg by mouth daily.        Marland Kitchen lisinopril (PRINIVIL,ZESTRIL) 10 MG tablet Take 10 mg by mouth daily.        . metFORMIN (GLUCOPHAGE) 1000 MG tablet Take 1,000 mg by mouth 2 (two) times daily with a meal.        . Omega-3 Fatty Acids (FISH OIL PO) Take 2,000 mg by mouth 2 (two) times daily.        . pioglitazone (ACTOS) 30 MG tablet Take 30 mg by mouth daily.        . rosuvastatin (CRESTOR) 10 MG tablet Take 10 mg by mouth once a week.          No Known Allergies  Family History  Problem Relation Age of Onset  . Hypertension Mother     History   Social History  . Marital Status: Married    Spouse Name: N/A    Number of Children:  N/A  . Years of Education: N/A   Occupational History  . Not on file.   Social History Main Topics  . Smoking status: Never Smoker   . Smokeless tobacco: Not on file  . Alcohol Use: No  . Drug Use: No  . Sexually Active: Not on file   Other Topics Concern  . Not on file   Social History Narrative  . No narrative on file    ROS ALL NEGATIVE EXCEPT THOSE NOTED IN HPI  PE  General Appearance: well developed, well nourished in no acute distress, obese HEENT: symmetrical face, PERRLA, good dentition  Neck: no JVD, thyromegaly, or adenopathy, trachea midline Chest: symmetric without deformity Cardiac: PMI non-displaced, RRR, normal S1, S2, no gallop or murmur Lung: clear to ausculation and percussion Vascular: all pulses full without bruits  Abdominal: nondistended, nontender, good bowel sounds, no HSM, no bruits Extremities: no cyanosis, clubbing or edema, no sign of DVT, no varicosities  Skin: normal color, no rashes Neuro: alert and oriented x 3, non-focal Pysch: normal affect  EKG Normal sinus rhythm, normal EKG. BMET No  results found for this basename: na, k, cl, co2, glucose, bun, creatinine, calcium, gfrnonaa, gfraa    Lipid Panel  No results found for this basename: chol, trig, hdl, cholhdl, vldl, ldlcalc    CBC No results found for this basename: wbc, rbc, hgb, hct, plt, mcv, mch, mchc, rdw, neutrabs, lymphsabs, monoabs, eosabs, basosabs

## 2011-09-11 ENCOUNTER — Other Ambulatory Visit: Payer: Self-pay | Admitting: Family Medicine

## 2011-09-11 MED ORDER — FENOFIBRATE 160 MG PO TABS: 160.0000 mg | ORAL_TABLET | Freq: Every day | ORAL | Status: DC

## 2011-11-01 ENCOUNTER — Ambulatory Visit (INDEPENDENT_AMBULATORY_CARE_PROVIDER_SITE_OTHER): Payer: BC Managed Care – PPO | Admitting: Family Medicine

## 2011-11-01 VITALS — BP 130/76 | HR 79 | Temp 98.0°F | Resp 18 | Ht 70.5 in | Wt 212.0 lb

## 2011-11-01 DIAGNOSIS — R059 Cough, unspecified: Secondary | ICD-10-CM

## 2011-11-01 DIAGNOSIS — R05 Cough: Secondary | ICD-10-CM

## 2011-11-01 DIAGNOSIS — E785 Hyperlipidemia, unspecified: Secondary | ICD-10-CM

## 2011-11-01 DIAGNOSIS — J45909 Unspecified asthma, uncomplicated: Secondary | ICD-10-CM

## 2011-11-01 DIAGNOSIS — E119 Type 2 diabetes mellitus without complications: Secondary | ICD-10-CM

## 2011-11-01 MED ORDER — BENZONATATE 100 MG PO CAPS: ORAL_CAPSULE | ORAL | Status: AC

## 2011-11-01 MED ORDER — ALBUTEROL SULFATE (2.5 MG/3ML) 0.083% IN NEBU
2.5000 mg | INHALATION_SOLUTION | Freq: Once | RESPIRATORY_TRACT | Status: AC
  Administered 2011-11-01: 2.5 mg via RESPIRATORY_TRACT

## 2011-11-01 MED ORDER — HYDROCODONE-HOMATROPINE 5-1.5 MG/5ML PO SYRP: 5.0000 mL | ORAL_SOLUTION | Freq: Three times a day (TID) | ORAL | Status: DC | PRN

## 2011-11-01 MED ORDER — AZITHROMYCIN 250 MG PO TABS: ORAL_TABLET | ORAL | Status: AC

## 2011-11-01 MED ORDER — ALBUTEROL SULFATE HFA 108 (90 BASE) MCG/ACT IN AERS: 2.0000 | INHALATION_SPRAY | Freq: Four times a day (QID) | RESPIRATORY_TRACT | Status: DC | PRN

## 2011-11-01 NOTE — Patient Instructions (Signed)
Take medications as ordered. If you start bringing up purulent phlegm, then used the antibiotics. Take plenty of fluids and get enough rest. If you're not doing better get back in touch with me.

## 2011-11-01 NOTE — Progress Notes (Signed)
Subjective: Patient has had a cough which developed last week. It started in his chest. He has some pretty intense coughing spells. He does wheeze some. He has a history of his cough schooling and asthmatic bronchitis pretty routinely. He does not smoke, never has. The cough is now gone up into his head 02. He has some rhinorrhea but has not looked at to see the color of it.  Objective  he has some paroxysms of coughing and sounding wheezy. His TMs are normal. Throat clear. Nose congested. Neck supple without nodes. Chest is clear throughout patient, though has prolonged expiratory phase  Assessment: Asthmatic bronchitis  Plan: Peak flow 460, should be 555 Patient was treated with the albuterol nebulizer. He improved to 620 on his peak flow. He is coughing less. Notably this is an active infection causing this, problem or seasonal allergies. We'll treat him accordingly. If he gets worse I will and prednisone, or if it remains more prolonged I will and a Symbicort inhaler. He is not to take antibiotics unless he starts coughing up

## 2011-11-03 ENCOUNTER — Other Ambulatory Visit: Payer: Self-pay | Admitting: Physician Assistant

## 2012-01-01 ENCOUNTER — Encounter: Payer: BC Managed Care – PPO | Admitting: Physician Assistant

## 2012-01-16 ENCOUNTER — Ambulatory Visit (INDEPENDENT_AMBULATORY_CARE_PROVIDER_SITE_OTHER): Payer: BC Managed Care – PPO | Admitting: Family Medicine

## 2012-01-16 VITALS — BP 131/75 | HR 76 | Temp 98.5°F | Resp 16 | Ht 70.5 in | Wt 212.4 lb

## 2012-01-16 DIAGNOSIS — IMO0002 Reserved for concepts with insufficient information to code with codable children: Secondary | ICD-10-CM

## 2012-01-16 DIAGNOSIS — E119 Type 2 diabetes mellitus without complications: Secondary | ICD-10-CM

## 2012-01-16 DIAGNOSIS — E785 Hyperlipidemia, unspecified: Secondary | ICD-10-CM

## 2012-01-16 DIAGNOSIS — I10 Essential (primary) hypertension: Secondary | ICD-10-CM

## 2012-01-16 LAB — POCT GLYCOSYLATED HEMOGLOBIN (HGB A1C): Hemoglobin A1C: 7.3

## 2012-01-16 LAB — COMPREHENSIVE METABOLIC PANEL
ALT: 21 U/L (ref 0–53)
AST: 16 U/L (ref 0–37)
Albumin: 4.7 g/dL (ref 3.5–5.2)
Alkaline Phosphatase: 28 U/L — ABNORMAL LOW (ref 39–117)
BUN: 22 mg/dL (ref 6–23)
CO2: 27 mEq/L (ref 19–32)
Calcium: 9.5 mg/dL (ref 8.4–10.5)
Chloride: 102 mEq/L (ref 96–112)
Creat: 1.19 mg/dL (ref 0.50–1.35)
Glucose, Bld: 189 mg/dL — ABNORMAL HIGH (ref 70–99)
Potassium: 4.4 mEq/L (ref 3.5–5.3)
Sodium: 140 mEq/L (ref 135–145)
Total Bilirubin: 0.3 mg/dL (ref 0.3–1.2)
Total Protein: 7.1 g/dL (ref 6.0–8.3)

## 2012-01-16 LAB — LIPID PANEL
Cholesterol: 183 mg/dL (ref 0–200)
HDL: 32 mg/dL — ABNORMAL LOW (ref >39)
LDL Cholesterol: 98 mg/dL (ref 0–99)
Total CHOL/HDL Ratio: 5.7 Ratio
Triglycerides: 264 mg/dL — ABNORMAL HIGH (ref <150)
VLDL: 53 mg/dL — ABNORMAL HIGH (ref 0–40)

## 2012-01-16 MED ORDER — FENOFIBRATE 160 MG PO TABS: 160.0000 mg | ORAL_TABLET | ORAL | Status: DC

## 2012-01-16 MED ORDER — DICLOFENAC SODIUM 75 MG PO TBEC: 75.0000 mg | DELAYED_RELEASE_TABLET | Freq: Two times a day (BID) | ORAL | Status: AC

## 2012-01-16 MED ORDER — GLIPIZIDE ER 10 MG PO TB24: ORAL_TABLET | ORAL | Status: DC

## 2012-01-16 MED ORDER — LISINOPRIL 10 MG PO TABS: 10.0000 mg | ORAL_TABLET | Freq: Every day | ORAL | Status: DC

## 2012-01-16 MED ORDER — METFORMIN HCL 1000 MG PO TABS: 1000.0000 mg | ORAL_TABLET | Freq: Two times a day (BID) | ORAL | Status: DC

## 2012-01-16 NOTE — Progress Notes (Signed)
Subjective: Patient has been doing well. No major complaints. He continues to work. His elbow bothers him a little bit. His hamstring stay tight. He takes his medications he was is not on the Crestor but his insurance wouldn't pay for it.  Objective: Looks fine chest clear heart regular without murmurs soft without mass or tenderness HEENT negative sensory grossly intact with filament test normal.  Right elbow is tender both the lateral and medial epicondyle areas. He has been lifting boxes at work.  Assessment : Diabetes Hypertension Hyperlipidemia Lateral epicondylitis  Plan: Same meds Check labs nsaid for elbow   Results for orders placed in visit on 01/16/12  POCT GLYCOSYLATED HEMOGLOBIN (HGB A1C)      Component Value Range   Hemoglobin A1C 7.3     Will add glipizide back voltaren for elbow  And glipizide 5 mg daily. Return 3 months.

## 2012-01-18 ENCOUNTER — Encounter: Payer: Self-pay | Admitting: Family Medicine

## 2012-04-17 NOTE — Progress Notes (Signed)
This encounter was created in error - please disregard.

## 2012-04-23 ENCOUNTER — Ambulatory Visit (INDEPENDENT_AMBULATORY_CARE_PROVIDER_SITE_OTHER): Payer: BC Managed Care – PPO | Admitting: Family Medicine

## 2012-04-23 ENCOUNTER — Other Ambulatory Visit: Payer: Self-pay | Admitting: Family Medicine

## 2012-04-23 VITALS — BP 173/89 | HR 71 | Temp 98.0°F | Resp 18 | Ht 70.0 in | Wt 218.4 lb

## 2012-04-23 VITALS — BP 132/76 | HR 93 | Temp 98.0°F | Resp 18 | Ht 70.0 in | Wt 216.0 lb

## 2012-04-23 DIAGNOSIS — E785 Hyperlipidemia, unspecified: Secondary | ICD-10-CM

## 2012-04-23 DIAGNOSIS — I659 Occlusion and stenosis of unspecified precerebral artery: Secondary | ICD-10-CM

## 2012-04-23 DIAGNOSIS — J45909 Unspecified asthma, uncomplicated: Secondary | ICD-10-CM

## 2012-04-23 DIAGNOSIS — I10 Essential (primary) hypertension: Secondary | ICD-10-CM

## 2012-04-23 DIAGNOSIS — E119 Type 2 diabetes mellitus without complications: Secondary | ICD-10-CM

## 2012-04-23 DIAGNOSIS — Z23 Encounter for immunization: Secondary | ICD-10-CM

## 2012-04-23 LAB — COMPREHENSIVE METABOLIC PANEL
ALT: 28 U/L (ref 0–53)
AST: 19 U/L (ref 0–37)
Albumin: 4.7 g/dL (ref 3.5–5.2)
Alkaline Phosphatase: 31 U/L — ABNORMAL LOW (ref 39–117)
BUN: 26 mg/dL — ABNORMAL HIGH (ref 6–23)
CO2: 23 mEq/L (ref 19–32)
Calcium: 9.8 mg/dL (ref 8.4–10.5)
Chloride: 104 mEq/L (ref 96–112)
Creat: 1.17 mg/dL (ref 0.50–1.35)
Glucose, Bld: 152 mg/dL — ABNORMAL HIGH (ref 70–99)
Potassium: 4.5 mEq/L (ref 3.5–5.3)
Sodium: 138 mEq/L (ref 135–145)
Total Bilirubin: 0.4 mg/dL (ref 0.3–1.2)
Total Protein: 7.1 g/dL (ref 6.0–8.3)

## 2012-04-23 LAB — IFOBT (OCCULT BLOOD): IFOBT: NEGATIVE

## 2012-04-23 LAB — LIPID PANEL
Cholesterol: 183 mg/dL (ref 0–200)
HDL: 31 mg/dL — ABNORMAL LOW (ref >39)
LDL Cholesterol: 103 mg/dL — ABNORMAL HIGH (ref 0–99)
Total CHOL/HDL Ratio: 5.9 Ratio
Triglycerides: 246 mg/dL — ABNORMAL HIGH (ref <150)
VLDL: 49 mg/dL — ABNORMAL HIGH (ref 0–40)

## 2012-04-23 LAB — POCT GLYCOSYLATED HEMOGLOBIN (HGB A1C): Hemoglobin A1C: 6.7

## 2012-04-23 MED ORDER — FENOFIBRATE 160 MG PO TABS: 160.0000 mg | ORAL_TABLET | ORAL | Status: DC

## 2012-04-23 MED ORDER — GLIPIZIDE ER 10 MG PO TB24: ORAL_TABLET | ORAL | Status: DC

## 2012-04-23 MED ORDER — METFORMIN HCL 1000 MG PO TABS: 1000.0000 mg | ORAL_TABLET | Freq: Two times a day (BID) | ORAL | Status: DC

## 2012-04-23 MED ORDER — LISINOPRIL 10 MG PO TABS: 10.0000 mg | ORAL_TABLET | Freq: Every day | ORAL | Status: DC

## 2012-04-23 NOTE — Progress Notes (Signed)
Came in later for the OV.  This was blood drawing fasting in AM

## 2012-04-23 NOTE — Patient Instructions (Addendum)
Continue current meds Exercise

## 2012-04-23 NOTE — Progress Notes (Signed)
History: Patient is here for a regular physical.  He has been doing well.  No major problems.  He does not check his sugars but thinks they will be better even though not losing weight.  PMH: Hx of DM and hypertriglyceridemia.  Not very hypertensive, primarily on lisinopril for the diabetic reasons.  Hx of heart disease.    Social Hx: works as an Radio producer.  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  Works at the [employer].    ROS: HEENT: normal.  Gets yearly eye exam WJ:[id number] Resp:normal GI: normal Gu: normal MS: always is stiff and hurts Derm:unremarkable Psy: normal Endo: diabetes as noted  Phy:  Overweight white male HEENt: normal.  Fundi b enign.  Chest CTA Heart RRR. No murmur or bruits.  Abd. Soft.  Normal male genitalia.  Rectal normal.  Prostate normal. EXT. Normal.  Pulses good.  Imp: Satisfactory CPE DM Hyperlipidemia HTN\ CAD Hx asthma  Plan;  Same RX Check labs  He may change to [employer] with new job, and we  Will have to send new prescriptions to his new mail in pharmacy at that time.   Results for orders placed in visit on 04/23/12  POCT GLYCOSYLATED HEMOGLOBIN (HGB A1C)      Component Value Range   Hemoglobin A1C 6.7

## 2012-04-23 NOTE — Progress Notes (Deleted)
      Results for orders placed in visit on 01/16/12  COMPREHENSIVE METABOLIC PANEL      Component Value Range   Sodium 140  135 - 145 mEq/L   Potassium 4.4  3.5 - 5.3 mEq/L   Chloride 102  96 - 112 mEq/L   CO2 27  19 - 32 mEq/L   Glucose, Bld 189 (*) 70 - 99 mg/dL   BUN 22  6 - 23 mg/dL   Creat 1.61  0.96 - 0.45 mg/dL   Total Bilirubin 0.3  0.3 - 1.2 mg/dL   Alkaline Phosphatase 28 (*) 39 - 117 U/L   AST 16  0 - 37 U/L   ALT 21  0 - 53 U/L   Total Protein 7.1  6.0 - 8.3 g/dL   Albumin 4.7  3.5 - 5.2 g/dL   Calcium 9.5  8.4 - 40.9 mg/dL  LIPID PANEL      Component Value Range   Cholesterol 183  0 - 200 mg/dL   Triglycerides 811 (*) <150 mg/dL   HDL 32 (*) >91 mg/dL   Total CHOL/HDL Ratio 5.7     VLDL 53 (*) 0 - 40 mg/dL   LDL Cholesterol 98  0 - 99 mg/dL  POCT GLYCOSYLATED HEMOGLOBIN (HGB A1C)      Component Value Range   Hemoglobin A1C 7.3

## 2012-05-16 ENCOUNTER — Telehealth: Payer: Self-pay | Admitting: *Deleted

## 2012-05-16 MED ORDER — GLIPIZIDE ER 5 MG PO TB24: 5.0000 mg | ORAL_TABLET | Freq: Every day | ORAL | Status: DC

## 2012-05-16 NOTE — Telephone Encounter (Signed)
Pt reported that he has been on the 5 mg Glipizide XL for quite some time and the Rx that Dr Alwyn Ren wrote for him last time was for the 10 mg. He doesn't think that Dr Alwyn Ren intended to inc his dose since his A1C was actually better. Called [facility] and pharmacy stated that although the printed copy of Rx had said 10 mg, it had been marked through and 5 mg had been written in by MD. They will have the 5 mg on hold for pt when he needs it. Notified pt that it is correct at pharmacy. Corrected Rx in EPIC

## 2012-05-16 NOTE — Telephone Encounter (Signed)
Left another message, for him to call me back.

## 2012-05-16 NOTE — Telephone Encounter (Signed)
Received fax from pharmacy [facility] [address] [place]. [phone] ) that pt was not aware of dose increase from GLIPIZIDE XL 5MG  TO XL 10MG . Secondly the XL 5mg  #30 is free and XL 10mg  is $22. Pt would like to continue on XL 5mg .  Looked in pt chart and it looks like pt has been on XL 10 for a while and has used [facility].  LMOM for pt to call back.

## 2012-06-11 ENCOUNTER — Other Ambulatory Visit: Payer: Self-pay | Admitting: Family Medicine

## 2012-07-07 ENCOUNTER — Other Ambulatory Visit: Payer: Self-pay | Admitting: Family Medicine

## 2012-07-08 ENCOUNTER — Other Ambulatory Visit: Payer: Self-pay | Admitting: Radiology

## 2012-07-08 MED ORDER — GLIPIZIDE ER 5 MG PO TB24: 5.0000 mg | ORAL_TABLET | Freq: Every day | ORAL | Status: DC

## 2012-07-19 ENCOUNTER — Telehealth: Payer: Self-pay

## 2012-07-19 NOTE — Telephone Encounter (Signed)
[name] Love has questions about glucotrol rx please call [phone]

## 2012-07-21 NOTE — Telephone Encounter (Signed)
Verified AGAIN with pharmacy this should be 5mg  not 10mg 

## 2012-07-21 NOTE — Telephone Encounter (Signed)
Left message for Karin Golden to call me back.

## 2012-08-06 ENCOUNTER — Telehealth: Payer: Self-pay

## 2012-08-06 NOTE — Telephone Encounter (Signed)
PT STATES WE CALLED AND STATES HE HAS CALLED ABOUT THIS 3TIMES. DR HOPPER USUALLY PRESCRIBED 5MG  OF GLIPIZIDE FOR HIM AND HE IS ABLE TO GET IT FILLED FREE AT [facility] ON [address] HOWEVER THE LAST RX FROM DR HOPPER WAS FOR 10MG  AND FOR THIS DOSE, HE HAS TO PAY AND IT IS SIGNIFICANT PER PT. PT IS OUT AND NEEDS HIS RX CHANGED BACK TO 5MG  PLEASE CALL PT TO ADVISE

## 2012-08-07 MED ORDER — GLIPIZIDE ER 5 MG PO TB24: 5.0000 mg | ORAL_TABLET | Freq: Every day | ORAL | Status: DC

## 2012-08-07 NOTE — Telephone Encounter (Signed)
LMOM for pt that we have sent in the 5 mg Glipizide to [facility] again and asked for CB if he has any further problems w/it.

## 2012-08-07 NOTE — Telephone Encounter (Signed)
Glipizide XL 5mg  sent to [facility]

## 2012-09-12 ENCOUNTER — Encounter: Payer: Self-pay | Admitting: Family Medicine

## 2012-09-12 ENCOUNTER — Ambulatory Visit (INDEPENDENT_AMBULATORY_CARE_PROVIDER_SITE_OTHER): Payer: Self-pay | Admitting: Family Medicine

## 2012-09-12 VITALS — BP 148/77 | HR 82 | Temp 97.8°F | Resp 18 | Ht 70.5 in | Wt 223.0 lb

## 2012-09-12 DIAGNOSIS — Z0000 Encounter for general adult medical examination without abnormal findings: Secondary | ICD-10-CM

## 2012-09-12 DIAGNOSIS — E1165 Type 2 diabetes mellitus with hyperglycemia: Secondary | ICD-10-CM | POA: Insufficient documentation

## 2012-09-12 DIAGNOSIS — E119 Type 2 diabetes mellitus without complications: Secondary | ICD-10-CM

## 2012-09-12 DIAGNOSIS — E785 Hyperlipidemia, unspecified: Secondary | ICD-10-CM

## 2012-09-12 DIAGNOSIS — I10 Essential (primary) hypertension: Secondary | ICD-10-CM

## 2012-09-12 DIAGNOSIS — E669 Obesity, unspecified: Secondary | ICD-10-CM

## 2012-09-12 DIAGNOSIS — I2510 Atherosclerotic heart disease of native coronary artery without angina pectoris: Secondary | ICD-10-CM

## 2012-09-12 DIAGNOSIS — E1159 Type 2 diabetes mellitus with other circulatory complications: Secondary | ICD-10-CM | POA: Insufficient documentation

## 2012-09-12 DIAGNOSIS — Z951 Presence of aortocoronary bypass graft: Secondary | ICD-10-CM

## 2012-09-12 LAB — COMPREHENSIVE METABOLIC PANEL
ALT: 26 U/L (ref 0–53)
AST: 18 U/L (ref 0–37)
Albumin: 4.6 g/dL (ref 3.5–5.2)
Alkaline Phosphatase: 33 U/L — ABNORMAL LOW (ref 39–117)
BUN: 23 mg/dL (ref 6–23)
CO2: 22 mEq/L (ref 19–32)
Calcium: 9.6 mg/dL (ref 8.4–10.5)
Chloride: 104 mEq/L (ref 96–112)
Creat: 1.31 mg/dL (ref 0.50–1.35)
Glucose, Bld: 163 mg/dL — ABNORMAL HIGH (ref 70–99)
Potassium: 4.3 mEq/L (ref 3.5–5.3)
Sodium: 139 mEq/L (ref 135–145)
Total Bilirubin: 0.4 mg/dL (ref 0.3–1.2)
Total Protein: 7.1 g/dL (ref 6.0–8.3)

## 2012-09-12 LAB — POCT CBC
Granulocyte percent: 61.2 %G (ref 37–80)
HCT, POC: 41.6 % — AB (ref 43.5–53.7)
Hemoglobin: 13.6 g/dL — AB (ref 14.1–18.1)
Lymph, poc: 1.5 (ref 0.6–3.4)
MCH, POC: 27.5 pg (ref 27–31.2)
MCHC: 32.7 g/dL (ref 31.8–35.4)
MCV: 84.1 fL (ref 80–97)
MID (cbc): 0.3 (ref 0–0.9)
MPV: 9.6 fL (ref 0–99.8)
POC Granulocyte: 2.8 (ref 2–6.9)
POC LYMPH PERCENT: 32.8 %L (ref 10–50)
POC MID %: 6 %M (ref 0–12)
Platelet Count, POC: 218 10*3/uL (ref 142–424)
RBC: 4.95 M/uL (ref 4.69–6.13)
RDW, POC: 14.2 %
WBC: 4.6 10*3/uL (ref 4.6–10.2)

## 2012-09-12 LAB — POCT GLYCOSYLATED HEMOGLOBIN (HGB A1C): Hemoglobin A1C: 6.7

## 2012-09-12 LAB — LIPID PANEL
Cholesterol: 159 mg/dL (ref 0–200)
HDL: 26 mg/dL — ABNORMAL LOW (ref >39)
LDL Cholesterol: 71 mg/dL (ref 0–99)
Total CHOL/HDL Ratio: 6.1 Ratio
Triglycerides: 310 mg/dL — ABNORMAL HIGH (ref <150)
VLDL: 62 mg/dL — ABNORMAL HIGH (ref 0–40)

## 2012-09-12 LAB — TSH: TSH: 1.843 u[IU]/mL (ref 0.350–4.500)

## 2012-09-12 MED ORDER — LISINOPRIL 10 MG PO TABS: 10.0000 mg | ORAL_TABLET | Freq: Every day | ORAL | Status: DC

## 2012-09-12 MED ORDER — FENOFIBRATE 160 MG PO TABS: 160.0000 mg | ORAL_TABLET | ORAL | Status: DC

## 2012-09-12 MED ORDER — GLIPIZIDE ER 5 MG PO TB24: 5.0000 mg | ORAL_TABLET | Freq: Every day | ORAL | Status: DC

## 2012-09-12 MED ORDER — METFORMIN HCL 1000 MG PO TABS: 1000.0000 mg | ORAL_TABLET | Freq: Two times a day (BID) | ORAL | Status: DC

## 2012-09-12 NOTE — Progress Notes (Signed)
Complete physical examination  History: This is a 57 year old man well-known to me who presents for a physical examination. He has a history of diabetes, coronary artery disease, has had CABG, hypertension, and hyperlipidemia.   Past medical history: Medication allergies: None Current medications: Lisinopril metformin, glipizide, fenofibrate, and aspirin past medical history: Diabetes Coronary artery bypass Surgery: Coronary bypass as noted above  Family history: Mother is living, recent hip fracture, otherwise doing okay Father is deceased No major familial diseases except for mother's side of family has a lot of diabetes  Social history: Patient is married. That is his only sexual partner. He does not smoke or use any drugs. 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 Does not do much exercise.  Review of systems: Constitutional: Unremarkable HEENT: Unremarkable Respiratory: Unremarkable Cardiovascular: Unremarkable Gastrointestinal: Intermittent diarrhea Genitourinary: Unremarkable Musculoskeletal: Joint aches and pains Dermatologic: Unremarkable Neurologic: Unremarkable Hematologic: Unremarkable Psychiatric: Unremarkable   Physical examination: Well-developed well-nourished man in no major distress. Moderately overweight. TMs are normal. Eyes PERRLA. Fundi benign. Throat clear. Neck supple without nodes thyromegaly. No carotid bruits. Chest clear to auscultation. Heart regular without murmurs gallops or arrhythmias. And soft without mass or tenderness. Normal male external genitalia with no hernias. He had a digital rectal exam done last fall decided not repeat that. Extremities are unremarkable. Skin unremarkable. Sensory normal in his feet.  Assessment: Physical examination Diabetes Hypertension Hyperlipidemia History of coronary disease and CABG Arthralgias Diarrhea  Plan: Same medications Check labs Results for orders placed in visit on  09/12/12  POCT CBC      Result Value Range   WBC 4.6  4.6 - 10.2 K/uL   Lymph, poc 1.5  0.6 - 3.4   POC LYMPH PERCENT 32.8  10 - 50 %L   MID (cbc) 0.3  0 - 0.9   POC MID % 6.0  0 - 12 %M   POC Granulocyte 2.8  2 - 6.9   Granulocyte percent 61.2  37 - 80 %G   RBC 4.95  4.69 - 6.13 M/uL   Hemoglobin 13.6 (*) 14.1 - 18.1 g/dL   HCT, POC 78.4 (*) 69.6 - 53.7 %   MCV 84.1  80 - 97 fL   MCH, POC 27.5  27 - 31.2 pg   MCHC 32.7  31.8 - 35.4 g/dL   RDW, POC 29.5     Platelet Count, POC 218  142 - 424 K/uL   MPV 9.6  0 - 99.8 fL  POCT GLYCOSYLATED HEMOGLOBIN (HGB A1C)      Result Value Range   Hemoglobin A1C 6.7

## 2012-09-12 NOTE — Patient Instructions (Signed)
Same medications  Return in about June.

## 2012-09-14 ENCOUNTER — Encounter: Payer: Self-pay | Admitting: Family Medicine

## 2012-09-23 ENCOUNTER — Other Ambulatory Visit: Payer: Self-pay | Admitting: Family Medicine

## 2012-11-14 ENCOUNTER — Other Ambulatory Visit: Payer: Self-pay | Admitting: *Deleted

## 2012-11-14 DIAGNOSIS — I10 Essential (primary) hypertension: Secondary | ICD-10-CM

## 2012-11-14 DIAGNOSIS — E785 Hyperlipidemia, unspecified: Secondary | ICD-10-CM

## 2012-11-14 MED ORDER — LISINOPRIL 10 MG PO TABS: 10.0000 mg | ORAL_TABLET | Freq: Every day | ORAL | Status: DC

## 2012-11-14 MED ORDER — FENOFIBRATE 160 MG PO TABS: 160.0000 mg | ORAL_TABLET | ORAL | Status: DC

## 2013-02-20 ENCOUNTER — Other Ambulatory Visit (HOSPITAL_COMMUNITY): Payer: Self-pay | Admitting: Specialist

## 2013-02-20 DIAGNOSIS — M545 Low back pain, unspecified: Secondary | ICD-10-CM

## 2013-02-26 ENCOUNTER — Encounter (HOSPITAL_COMMUNITY): Payer: BC Managed Care – PPO

## 2013-03-20 ENCOUNTER — Encounter (HOSPITAL_COMMUNITY): Payer: Self-pay

## 2013-03-20 ENCOUNTER — Encounter (HOSPITAL_COMMUNITY)
Admission: RE | Admit: 2013-03-20 | Discharge: 2013-03-20 | Disposition: A | Discharge status: Home / Self Care | Payer: Managed Care, Other (non HMO) | Source: Ambulatory Visit | Attending: Specialist | Admitting: Specialist

## 2013-03-20 DIAGNOSIS — M545 Low back pain, unspecified: Secondary | ICD-10-CM

## 2013-03-20 MED ORDER — TECHNETIUM TC 99M MEDRONATE IV KIT
25.6000 | PACK | Freq: Once | INTRAVENOUS | Status: AC | PRN
  Administered 2013-03-20: 25.6 via INTRAVENOUS

## 2013-04-03 ENCOUNTER — Other Ambulatory Visit: Payer: Self-pay | Admitting: Family Medicine

## 2013-05-08 ENCOUNTER — Encounter: Payer: Self-pay | Admitting: *Deleted

## 2013-05-12 ENCOUNTER — Other Ambulatory Visit: Payer: Self-pay | Admitting: Family Medicine

## 2013-05-15 ENCOUNTER — Other Ambulatory Visit: Payer: Self-pay | Admitting: Family Medicine

## 2013-05-22 ENCOUNTER — Ambulatory Visit: Payer: Managed Care, Other (non HMO) | Admitting: Family Medicine

## 2013-05-22 ENCOUNTER — Other Ambulatory Visit (INDEPENDENT_AMBULATORY_CARE_PROVIDER_SITE_OTHER): Payer: Managed Care, Other (non HMO) | Admitting: Family Medicine

## 2013-05-22 VITALS — BP 137/78 | HR 78 | Temp 98.0°F | Resp 18 | Ht 70.5 in | Wt 231.0 lb

## 2013-05-22 VITALS — BP 132/78 | HR 78 | Temp 98.0°F | Resp 18 | Ht 70.5 in | Wt 231.0 lb

## 2013-05-22 DIAGNOSIS — M67919 Unspecified disorder of synovium and tendon, unspecified shoulder: Secondary | ICD-10-CM

## 2013-05-22 DIAGNOSIS — E1165 Type 2 diabetes mellitus with hyperglycemia: Secondary | ICD-10-CM

## 2013-05-22 DIAGNOSIS — I10 Essential (primary) hypertension: Secondary | ICD-10-CM

## 2013-05-22 DIAGNOSIS — IMO0002 Reserved for concepts with insufficient information to code with codable children: Secondary | ICD-10-CM

## 2013-05-22 DIAGNOSIS — E785 Hyperlipidemia, unspecified: Secondary | ICD-10-CM

## 2013-05-22 DIAGNOSIS — N529 Male erectile dysfunction, unspecified: Secondary | ICD-10-CM

## 2013-05-22 DIAGNOSIS — Z0000 Encounter for general adult medical examination without abnormal findings: Secondary | ICD-10-CM

## 2013-05-22 DIAGNOSIS — M778 Other enthesopathies, not elsewhere classified: Secondary | ICD-10-CM

## 2013-05-22 DIAGNOSIS — J45909 Unspecified asthma, uncomplicated: Secondary | ICD-10-CM

## 2013-05-22 DIAGNOSIS — E119 Type 2 diabetes mellitus without complications: Secondary | ICD-10-CM

## 2013-05-22 LAB — POCT CBC
Granulocyte percent: 57.3 %G (ref 37–80)
HCT, POC: 44.2 % (ref 43.5–53.7)
Hemoglobin: 14.1 g/dL (ref 14.1–18.1)
Lymph, poc: 1.5 (ref 0.6–3.4)
MCH, POC: 28.3 pg (ref 27–31.2)
MCHC: 31.9 g/dL (ref 31.8–35.4)
MCV: 88.5 fL (ref 80–97)
MID (cbc): 0.3 (ref 0–0.9)
MPV: 11.3 fL (ref 0–99.8)
POC Granulocyte: 2.4 (ref 2–6.9)
POC LYMPH PERCENT: 35.8 %L (ref 10–50)
POC MID %: 6.9 %M (ref 0–12)
Platelet Count, POC: 197 10*3/uL (ref 142–424)
RBC: 4.99 M/uL (ref 4.69–6.13)
RDW, POC: 14.6 %
WBC: 4.2 10*3/uL — AB (ref 4.6–10.2)

## 2013-05-22 LAB — LIPID PANEL
Cholesterol: 182 mg/dL (ref 0–200)
HDL: 26 mg/dL — ABNORMAL LOW (ref >39)
Total CHOL/HDL Ratio: 7 Ratio
Triglycerides: 457 mg/dL — ABNORMAL HIGH (ref <150)

## 2013-05-22 LAB — TSH: TSH: 2.918 u[IU]/mL (ref 0.350–4.500)

## 2013-05-22 LAB — POCT GLYCOSYLATED HEMOGLOBIN (HGB A1C): Hemoglobin A1C: 7.2

## 2013-05-22 LAB — COMPREHENSIVE METABOLIC PANEL
ALT: 38 U/L (ref 0–53)
AST: 27 U/L (ref 0–37)
Albumin: 4.5 g/dL (ref 3.5–5.2)
Alkaline Phosphatase: 29 U/L — ABNORMAL LOW (ref 39–117)
BUN: 24 mg/dL — ABNORMAL HIGH (ref 6–23)
CO2: 25 mEq/L (ref 19–32)
Calcium: 9.8 mg/dL (ref 8.4–10.5)
Chloride: 102 mEq/L (ref 96–112)
Creat: 1.45 mg/dL — ABNORMAL HIGH (ref 0.50–1.35)
Glucose, Bld: 180 mg/dL — ABNORMAL HIGH (ref 70–99)
Potassium: 4.9 mEq/L (ref 3.5–5.3)
Sodium: 136 mEq/L (ref 135–145)
Total Bilirubin: 0.4 mg/dL (ref 0.3–1.2)
Total Protein: 7 g/dL (ref 6.0–8.3)

## 2013-05-22 MED ORDER — AZITHROMYCIN 250 MG PO TABS: ORAL_TABLET | ORAL | Status: DC

## 2013-05-22 MED ORDER — LOSARTAN POTASSIUM 50 MG PO TABS: 50.0000 mg | ORAL_TABLET | Freq: Every day | ORAL | Status: DC

## 2013-05-22 MED ORDER — SILDENAFIL CITRATE 100 MG PO TABS: ORAL_TABLET | ORAL | Status: DC

## 2013-05-22 MED ORDER — ROSUVASTATIN CALCIUM 10 MG PO TABS: 10.0000 mg | ORAL_TABLET | ORAL | Status: DC

## 2013-05-22 MED ORDER — METFORMIN HCL 1000 MG PO TABS: 1000.0000 mg | ORAL_TABLET | Freq: Two times a day (BID) | ORAL | Status: DC

## 2013-05-22 MED ORDER — ALBUTEROL SULFATE HFA 108 (90 BASE) MCG/ACT IN AERS: 2.0000 | INHALATION_SPRAY | RESPIRATORY_TRACT | Status: DC | PRN

## 2013-05-22 MED ORDER — FENOFIBRATE 160 MG PO TABS: 160.0000 mg | ORAL_TABLET | ORAL | Status: DC

## 2013-05-22 MED ORDER — GLIPIZIDE ER 5 MG PO TB24: 5.0000 mg | ORAL_TABLET | Freq: Every day | ORAL | Status: DC

## 2013-05-22 NOTE — Progress Notes (Signed)
Subjective: Patient is here for a number of things. He needs followup on his diabetes and blood pressure. He has had a few problems which [place] for his blood pressure medications. We talked about the fact that blood pressure can cause that also as well as diabetes can. ED is been a difficulty to him in the past. Medications are expensive but we are going to try something again.  He has had an asthmatic bronchitis again the last several weeks. He's been treated for this in the past.  Exam pain is in his right shoulder. He had problems with it several years ago and received injection for it.  He's had recent back problems for which he is seeing a back doctor.  He has gone back to a gymnasium recently and is trying to work on losing weight.  Objective: Overweight male in no acute distress. Right shoulder has tenderness just below the mid part of the superior aspect of his shoulder joint. His throat clear. Neck supple without nodes. Chest clear. Heart regular without murmurs. Your wheezing today but it sure sounds like a wheezy cough. And soft he has been having trouble with cramps and lateral walls of his abdomen but nothing grossly abnormal. Extremities unremarkable with no ankle edema. Sensation in feet.  Assessment: Diabetes Hypertension Hyperlipidemia Weight gain Shoulder pain Erectile dysfunction Cramps in side of abdomen  Plan: Using sterile technique the right shoulder was injected using posterior approach with 40 mg of Depo-Medrol and 2 cc of 2% lidocaine.  Medication for represcribed  Change him from lisinopril to losartan 50  Return if there for months, sooner if problems. Preordered labs including a hemoglobin A1c, complete metabolic panel, and lipids    Results for orders placed in visit on 05/22/13  LIPID PANEL      Result Value Range   Cholesterol 182  0 - 200 mg/dL   Triglycerides 295 (*) <150 mg/dL   HDL 26 (*) >62 mg/dL   Total CHOL/HDL Ratio 7.0     VLDL NOT  CALC  0 - 40 mg/dL   LDL Cholesterol    0 - 99 mg/dL  COMPREHENSIVE METABOLIC PANEL      Result Value Range   Sodium 136  135 - 145 mEq/L   Potassium 4.9  3.5 - 5.3 mEq/L   Chloride 102  96 - 112 mEq/L   CO2 25  19 - 32 mEq/L   Glucose, Bld 180 (*) 70 - 99 mg/dL   BUN 24 (*) 6 - 23 mg/dL   Creat 1.30 (*) 8.65 - 1.35 mg/dL   Total Bilirubin 0.4  0.3 - 1.2 mg/dL   Alkaline Phosphatase 29 (*) 39 - 117 U/L   AST 27  0 - 37 U/L   ALT 38  0 - 53 U/L   Total Protein 7.0  6.0 - 8.3 g/dL   Albumin 4.5  3.5 - 5.2 g/dL   Calcium 9.8  8.4 - 78.4 mg/dL  TSH      Result Value Range   TSH 2.918  0.350 - 4.500 uIU/mL  POCT CBC      Result Value Range   WBC 4.2 (*) 4.6 - 10.2 K/uL   Lymph, poc 1.5  0.6 - 3.4   POC LYMPH PERCENT 35.8  10 - 50 %L   MID (cbc) 0.3  0 - 0.9   POC MID % 6.9  0 - 12 %M   POC Granulocyte 2.4  2 - 6.9   Granulocyte percent 57.3  37 - 80 %G   RBC 4.99  4.69 - 6.13 M/uL   Hemoglobin 14.1  14.1 - 18.1 g/dL   HCT, POC 16.1  09.6 - 53.7 %   MCV 88.5  80 - 97 fL   MCH, POC 28.3  27 - 31.2 pg   MCHC 31.9  31.8 - 35.4 g/dL   RDW, POC 04.5     Platelet Count, POC 197  142 - 424 K/uL   MPV 11.3  0 - 99.8 fL  POCT GLYCOSYLATED HEMOGLOBIN (HGB A1C)      Result Value Range   Hemoglobin A1C 7.2

## 2013-05-22 NOTE — Patient Instructions (Signed)
Work hard on weight loss and exercise.  Put some ice to  shoulder tonight.  Stop the lisinopril and begin losartan twice daily  Use the inhaler for your breathing. If you're not doing better over the next few days then go and get antibiotics filled. 2 inhalations of the albuterol 3 or 4 times daily. If wheezing persists let me know

## 2013-05-27 ENCOUNTER — Other Ambulatory Visit: Payer: Self-pay

## 2013-05-27 DIAGNOSIS — IMO0002 Reserved for concepts with insufficient information to code with codable children: Secondary | ICD-10-CM

## 2013-05-27 DIAGNOSIS — E1165 Type 2 diabetes mellitus with hyperglycemia: Secondary | ICD-10-CM

## 2013-05-27 MED ORDER — METFORMIN HCL 1000 MG PO TABS: 1000.0000 mg | ORAL_TABLET | Freq: Two times a day (BID) | ORAL | Status: DC

## 2013-05-27 MED ORDER — GLIPIZIDE ER 5 MG PO TB24: 5.0000 mg | ORAL_TABLET | Freq: Every day | ORAL | Status: DC

## 2013-05-28 ENCOUNTER — Telehealth: Payer: Self-pay

## 2013-05-28 DIAGNOSIS — E785 Hyperlipidemia, unspecified: Secondary | ICD-10-CM

## 2013-05-28 MED ORDER — ROSUVASTATIN CALCIUM 10 MG PO TABS: 10.0000 mg | ORAL_TABLET | Freq: Every day | ORAL | Status: DC

## 2013-05-28 NOTE — Telephone Encounter (Signed)
Received fax from [facility] asking for clarification of Rx for Crestor. The # 90 is correct but sig reads take one tab weekly instead of daily. I sent back clarification and will change in EPIC.

## 2013-06-03 ENCOUNTER — Ambulatory Visit: Payer: Managed Care, Other (non HMO) | Admitting: Cardiology

## 2013-06-10 ENCOUNTER — Telehealth: Payer: Self-pay | Admitting: Pharmacist

## 2013-06-10 ENCOUNTER — Telehealth: Payer: Self-pay | Admitting: Interventional Cardiology

## 2013-06-10 ENCOUNTER — Encounter: Payer: Self-pay | Admitting: Interventional Cardiology

## 2013-06-10 ENCOUNTER — Ambulatory Visit (INDEPENDENT_AMBULATORY_CARE_PROVIDER_SITE_OTHER): Payer: Managed Care, Other (non HMO) | Admitting: Interventional Cardiology

## 2013-06-10 VITALS — BP 130/80 | HR 79 | Ht 70.5 in | Wt 231.0 lb

## 2013-06-10 DIAGNOSIS — I10 Essential (primary) hypertension: Secondary | ICD-10-CM

## 2013-06-10 DIAGNOSIS — E669 Obesity, unspecified: Secondary | ICD-10-CM

## 2013-06-10 DIAGNOSIS — I2510 Atherosclerotic heart disease of native coronary artery without angina pectoris: Secondary | ICD-10-CM

## 2013-06-10 DIAGNOSIS — R9431 Abnormal electrocardiogram [ECG] [EKG]: Secondary | ICD-10-CM

## 2013-06-10 DIAGNOSIS — Z79899 Other long term (current) drug therapy: Secondary | ICD-10-CM

## 2013-06-10 DIAGNOSIS — E782 Mixed hyperlipidemia: Secondary | ICD-10-CM

## 2013-06-10 MED ORDER — SIMVASTATIN 40 MG PO TABS: 40.0000 mg | ORAL_TABLET | Freq: Every day | ORAL | Status: DC

## 2013-06-10 NOTE — Patient Instructions (Signed)
Your physician has requested that you have an echocardiogram. Echocardiography is a painless test that uses sound waves to create images of your heart. It provides your doctor with information about the size and shape of your heart and how well your heart's chambers and valves are working. This procedure takes approximately one hour. There are no restrictions for this procedure.  Your physician wants you to follow-up in: 1 year with Dr. Varanasi. You will receive a reminder letter in the mail two months in advance. If you don't receive a letter, please call our office to schedule the follow-up appointment.  

## 2013-06-10 NOTE — Telephone Encounter (Signed)
Please evaluate his recent lipids.  No intolerance to statins in teh past that we know of.

## 2013-06-10 NOTE — Progress Notes (Signed)
Patient ID: [name] Love, male   DOB: [birth year], 57 y.o.   MRN: [id number]    [address] [place], [place]  [address] Phone: [phone] Fax:  [phone]  Date:  06/10/2013   ID:  [name] Love, DOB 1955-12-24, MRN [id number]  PCP:  Janace Hoard, MD      History of Present Illness: [name] is a 57 y.o. male who had CABG in 2007.  He has done well since that time.  He had a nuclear stress test in 2012 that was ok.  He now goes to the gym 2-3 days /week.  He will do cardio and weights.  No CP , SHOB.  No sx like what he had prior to CABG.  He is increasing speed on the eeliptical.    No orthopnea, edema.  No PND.  No lightheadedness or syncope.  DM control has been fair.     Wt Readings from Last 3 Encounters:  06/10/13 231 lb (104.781 kg)  05/22/13 231 lb (104.781 kg)  05/22/13 231 lb (104.781 kg)     Past Medical History  Diagnosis Date  . Coronary artery disease     post CABG x3 in 2007  . Diabetes mellitus   . Hyperlipidemia   . Hypertension   . Obesity     Current Outpatient Prescriptions  Medication Sig Dispense Refill  . albuterol (PROVENTIL HFA;VENTOLIN HFA) 108 (90 BASE) MCG/ACT inhaler Inhale 2 puffs into the lungs every 4 (four) hours as needed for wheezing (cough, shortness of breath or wheezing.).  1 Inhaler  1  . aspirin 325 MG tablet Take 325 mg by mouth daily.        . fenofibrate 160 MG tablet Take 1 tablet (160 mg total) by mouth 1 day or 1 dose.  909 tablet  3  . glipiZIDE (GLIPIZIDE XL) 5 MG 24 hr tablet Take 1 tablet (5 mg total) by mouth daily.  90 tablet  3  . losartan (COZAAR) 50 MG tablet Take 1 tablet (50 mg total) by mouth daily.  90 tablet  3  . metFORMIN (GLUCOPHAGE) 1000 MG tablet Take 1 tablet (1,000 mg total) by mouth 2 (two) times daily with a meal.  180 tablet  3  . Omega-3 Fatty Acids (FISH OIL PO) Take 2,000 mg by mouth 2 (two) times daily.        Marland Kitchen albuterol (PROVENTIL HFA;VENTOLIN  HFA) 108 (90 BASE) MCG/ACT inhaler Inhale 2 puffs into the lungs every 6 (six) hours as needed for wheezing.  1 Inhaler  0  . sildenafil (VIAGRA) 100 MG tablet Use one half tablet 1-2 hours prior to anticipated intercourse  10 tablet  3   No current facility-administered medications for this visit.    Allergies:   No Known Allergies  Social History:  The patient  reports that he has never smoked. He does not have any smokeless tobacco history on file. He reports that he drinks about 0.5 ounces of alcohol per week. He reports that he does not use illicit drugs.   Family History:  The patient's family history includes Cancer in his father; Heart disease in his mother. Several uncles with bypasses.    ROS:  Please see the history of present illness.  No nausea, vomiting.  No fevers, chills.  No focal weakness.  No dysuria.   All other systems reviewed and negative.   PHYSICAL EXAM: VS:  BP 130/80  Pulse 79  Ht 5' 10.5" (1.791 m)  Wt  231 lb (104.781 kg)  BMI 32.67 kg/m2 Well nourished, well developed, in no acute distress HEENT: normal Neck: no JVD, no carotid bruits Cardiac:  normal S1, S2; RRR;  Lungs:  clear to auscultation bilaterally, no wheezing, rhonchi or rales Abd: soft, nontender, no hepatomegaly Ext: no edema Skin: warm and dry Neuro:   no focal abnormalities noted  EKG:  Normal sinus rhythm, inferior Q waves, ST depressions and T-wave inversions in the anterolateral leads. New from ECG in 2012.     ASSESSMENT AND PLAN:  1. CAD: prior EF 45-50% at the time of cath. Status post bypass. Negative stress test in 2012 per his report.  No angina. 2. Hyperlipidemia: Tolerated Crestor in the past.  Elevated TG.  Will send results to [place].   3. Abnormal ECG.  New T wave inversion in the lateral leads. Given prior decrease in LV function, would plan on echocardiogram.   4.  hypertension: Blood pressure controlled  Signed, Fredric Mare, MD, [facility] 06/10/2013 11:34 AM

## 2013-06-10 NOTE — Telephone Encounter (Signed)
Patient with h/o CABG 2007, diabetes, and hypertension.  He is currently on fenofibrate 160 mg qd and fish oil 4 g/d.  He is not taking the Crestor weekly as stated in his chart, as insurance wouldn't cover it.  He hasn't failed statins in the past to his knowledge.  Finances is an issue, so he needs to keep the cost of a statin low as possible.  He uses [facility] - simvastatin is on their list for cheaper meds.  Lipitor is not on their list at this time.  His LDL is 71, however non-HDL signfiicantly elevated at 156 mg/dL (goal < 960). His TG are 457 mg/dL despite fenofibrate and fish oil.  Scr 1.45, and CrCl ~ 80 ml/min.  Last A1C reasonably controlled at 7.2.  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  Patient admits to not taking fenofibrate with food.  Not taking a statin.  Plan: 1.  Start simvastatin 40 mg qd - this is an affordable option for him.  If non-HDL remains elevated in future, may try atorvastatin.  Patient instructed to call me back if any muscle aches or side effects. 2.  Start taking fenofibrate 160 mg with largest meal of the day. 3.  Continue fish oil 4 g/d. 4.  Recheck lipid and liver in 3 months (09/10/13 lab set up).    To Dr. Eldridge Dace as FYI only.

## 2013-06-24 ENCOUNTER — Other Ambulatory Visit (HOSPITAL_COMMUNITY): Payer: Managed Care, Other (non HMO)

## 2013-06-26 ENCOUNTER — Encounter (HOSPITAL_COMMUNITY): Payer: Self-pay | Admitting: Interventional Cardiology

## 2013-06-30 ENCOUNTER — Ambulatory Visit (HOSPITAL_COMMUNITY): Payer: Managed Care, Other (non HMO) | Attending: Interventional Cardiology | Admitting: Cardiology

## 2013-06-30 ENCOUNTER — Encounter (INDEPENDENT_AMBULATORY_CARE_PROVIDER_SITE_OTHER): Payer: Self-pay

## 2013-06-30 DIAGNOSIS — I10 Essential (primary) hypertension: Secondary | ICD-10-CM | POA: Insufficient documentation

## 2013-06-30 DIAGNOSIS — E669 Obesity, unspecified: Secondary | ICD-10-CM | POA: Insufficient documentation

## 2013-06-30 DIAGNOSIS — R9431 Abnormal electrocardiogram [ECG] [EKG]: Secondary | ICD-10-CM

## 2013-06-30 DIAGNOSIS — E119 Type 2 diabetes mellitus without complications: Secondary | ICD-10-CM | POA: Insufficient documentation

## 2013-06-30 DIAGNOSIS — Z6833 Body mass index (BMI) 33.0-33.9, adult: Secondary | ICD-10-CM | POA: Insufficient documentation

## 2013-06-30 DIAGNOSIS — E785 Hyperlipidemia, unspecified: Secondary | ICD-10-CM | POA: Insufficient documentation

## 2013-06-30 DIAGNOSIS — I2510 Atherosclerotic heart disease of native coronary artery without angina pectoris: Secondary | ICD-10-CM

## 2013-06-30 NOTE — Progress Notes (Signed)
Echo performed. 

## 2013-07-26 ENCOUNTER — Telehealth: Payer: Self-pay | Admitting: Family Medicine

## 2013-07-26 NOTE — Telephone Encounter (Signed)
Pt called and would like c/b to change is rx's to Mail Order Pharmacy since he had a change in insurance.

## 2013-07-28 ENCOUNTER — Telehealth: Payer: Self-pay

## 2013-07-28 DIAGNOSIS — I10 Essential (primary) hypertension: Secondary | ICD-10-CM

## 2013-07-28 DIAGNOSIS — E785 Hyperlipidemia, unspecified: Secondary | ICD-10-CM

## 2013-07-28 MED ORDER — LOSARTAN POTASSIUM 50 MG PO TABS: 50.0000 mg | ORAL_TABLET | Freq: Every day | ORAL | Status: DC

## 2013-07-28 MED ORDER — FENOFIBRATE 160 MG PO TABS: 160.0000 mg | ORAL_TABLET | ORAL | Status: DC

## 2013-07-28 NOTE — Telephone Encounter (Signed)
What mail order does he use? Does he need medications? Left message for him to call back and advise.

## 2013-07-28 NOTE — Telephone Encounter (Signed)
This is fine, will change mail order. [facility] and [facility]zaar

## 2013-07-28 NOTE — Telephone Encounter (Signed)
Patient called to speak to Amy about his rx refill medication. He wanted to let you know that he needs to change the mail order program, because his employer changed the company's insurance from [place] to [employer]. Please advise.   Best:  [phone]

## 2013-08-07 ENCOUNTER — Ambulatory Visit: Payer: BC Managed Care – PPO | Admitting: Family Medicine

## 2013-08-07 VITALS — BP 130/60 | HR 83 | Temp 98.7°F | Resp 18 | Ht 71.5 in | Wt 239.6 lb

## 2013-08-07 DIAGNOSIS — R059 Cough, unspecified: Secondary | ICD-10-CM

## 2013-08-07 DIAGNOSIS — J45909 Unspecified asthma, uncomplicated: Secondary | ICD-10-CM

## 2013-08-07 DIAGNOSIS — R05 Cough: Secondary | ICD-10-CM

## 2013-08-07 MED ORDER — ALBUTEROL SULFATE HFA 108 (90 BASE) MCG/ACT IN AERS: 2.0000 | INHALATION_SPRAY | RESPIRATORY_TRACT | Status: DC | PRN

## 2013-08-07 MED ORDER — AZITHROMYCIN 250 MG PO TABS: ORAL_TABLET | ORAL | Status: DC

## 2013-08-07 MED ORDER — HYDROCODONE-HOMATROPINE 5-1.5 MG/5ML PO SYRP: 5.0000 mL | ORAL_SOLUTION | ORAL | Status: DC | PRN

## 2013-08-07 NOTE — Progress Notes (Signed)
Subjective: Patient is well-known to me. He's had a ten-day cough. He has been feeling like his a lot in his chest he needs come out. He has a history of asthmatic bronchitis. He had a viral-like onset to this. He does not smoke. He doesn't check his diabetes blood sugar regularly. He is going to come in next month for physical exam.  Objective: Pleasant gentleman in no major distress. TMs normal. Throat clear. Neck supple without nodes. Chest has minimal congestion. Heart regular without murmurs.  Assessment: Post viral bronchitis  Plan: We'll go ahead and give him a round of antibiotics is in today. He is on medications. Giving comfort. Return next month. If you

## 2013-08-07 NOTE — Patient Instructions (Signed)
Drink plenty of fluids  Use the medications as directed  Return if worse

## 2013-09-01 ENCOUNTER — Telehealth: Payer: Self-pay | Admitting: Family Medicine

## 2013-09-01 ENCOUNTER — Telehealth: Payer: Self-pay

## 2013-09-01 DIAGNOSIS — N189 Chronic kidney disease, unspecified: Secondary | ICD-10-CM

## 2013-09-01 NOTE — Telephone Encounter (Signed)
Future orders placed for Dr. Erasmo Score per Dr Linna Darner.

## 2013-09-02 NOTE — Telephone Encounter (Signed)
Discussed with patient's. He will be coming in soon.

## 2013-09-10 ENCOUNTER — Other Ambulatory Visit: Payer: Managed Care, Other (non HMO)

## 2013-09-15 ENCOUNTER — Ambulatory Visit (INDEPENDENT_AMBULATORY_CARE_PROVIDER_SITE_OTHER): Payer: BC Managed Care – PPO | Admitting: Family Medicine

## 2013-09-15 ENCOUNTER — Other Ambulatory Visit: Payer: Self-pay | Admitting: Radiology

## 2013-09-15 VITALS — BP 136/68 | HR 83 | Temp 98.2°F | Resp 16 | Ht 70.0 in | Wt 233.0 lb

## 2013-09-15 DIAGNOSIS — M549 Dorsalgia, unspecified: Secondary | ICD-10-CM

## 2013-09-15 DIAGNOSIS — E118 Type 2 diabetes mellitus with unspecified complications: Secondary | ICD-10-CM

## 2013-09-15 DIAGNOSIS — I2510 Atherosclerotic heart disease of native coronary artery without angina pectoris: Secondary | ICD-10-CM

## 2013-09-15 DIAGNOSIS — E785 Hyperlipidemia, unspecified: Secondary | ICD-10-CM

## 2013-09-15 DIAGNOSIS — IMO0002 Reserved for concepts with insufficient information to code with codable children: Secondary | ICD-10-CM

## 2013-09-15 DIAGNOSIS — N189 Chronic kidney disease, unspecified: Secondary | ICD-10-CM

## 2013-09-15 DIAGNOSIS — Z0000 Encounter for general adult medical examination without abnormal findings: Secondary | ICD-10-CM

## 2013-09-15 DIAGNOSIS — N529 Male erectile dysfunction, unspecified: Secondary | ICD-10-CM | POA: Insufficient documentation

## 2013-09-15 DIAGNOSIS — I10 Essential (primary) hypertension: Secondary | ICD-10-CM

## 2013-09-15 DIAGNOSIS — E1165 Type 2 diabetes mellitus with hyperglycemia: Secondary | ICD-10-CM

## 2013-09-15 LAB — POCT URINALYSIS DIPSTICK
Bilirubin, UA: NEGATIVE
Blood, UA: NEGATIVE
Glucose, UA: NEGATIVE
Ketones, UA: NEGATIVE
Leukocytes, UA: NEGATIVE
Nitrite, UA: NEGATIVE
Protein, UA: 30
Spec Grav, UA: >=1.03
Urobilinogen, UA: 0.2
pH, UA: 5

## 2013-09-15 LAB — CBC WITH DIFFERENTIAL/PLATELET
Basophils Absolute: 0 10*3/uL (ref 0.0–0.1)
Basophils Relative: 1 % (ref 0–1)
Eosinophils Absolute: 0.1 10*3/uL (ref 0.0–0.7)
Eosinophils Relative: 3 % (ref 0–5)
HCT: 40.5 % (ref 39.0–52.0)
Hemoglobin: 13.4 g/dL (ref 13.0–17.0)
Lymphocytes Relative: 38 % (ref 12–46)
Lymphs Abs: 1.6 10*3/uL (ref 0.7–4.0)
MCH: 27.3 pg (ref 26.0–34.0)
MCHC: 33.1 g/dL (ref 30.0–36.0)
MCV: 82.5 fL (ref 78.0–100.0)
Monocytes Absolute: 0.3 10*3/uL (ref 0.1–1.0)
Monocytes Relative: 8 % (ref 3–12)
Neutro Abs: 2.1 10*3/uL (ref 1.7–7.7)
Neutrophils Relative %: 50 % (ref 43–77)
Platelets: 198 10*3/uL (ref 150–400)
RBC: 4.91 MIL/uL (ref 4.22–5.81)
RDW: 15.3 % (ref 11.5–15.5)
WBC: 4.2 10*3/uL (ref 4.0–10.5)

## 2013-09-15 LAB — POCT UA - MICROSCOPIC ONLY
Casts, Ur, LPF, POC: NEGATIVE
Crystals, Ur, HPF, POC: NEGATIVE
Mucus, UA: NEGATIVE
RBC, urine, microscopic: NEGATIVE
WBC, Ur, HPF, POC: NEGATIVE
Yeast, UA: NEGATIVE

## 2013-09-15 LAB — POCT GLYCOSYLATED HEMOGLOBIN (HGB A1C): Hemoglobin A1C: 7

## 2013-09-15 MED ORDER — FENOFIBRATE 160 MG PO TABS: 160.0000 mg | ORAL_TABLET | ORAL | Status: DC

## 2013-09-15 MED ORDER — LOSARTAN POTASSIUM 50 MG PO TABS: 50.0000 mg | ORAL_TABLET | Freq: Every day | ORAL | Status: DC

## 2013-09-15 MED ORDER — GLIPIZIDE ER 5 MG PO TB24: 5.0000 mg | ORAL_TABLET | Freq: Every day | ORAL | Status: DC

## 2013-09-15 MED ORDER — TADALAFIL 5 MG PO TABS: 5.0000 mg | ORAL_TABLET | Freq: Every day | ORAL | Status: DC | PRN

## 2013-09-15 MED ORDER — METFORMIN HCL 1000 MG PO TABS: 1000.0000 mg | ORAL_TABLET | Freq: Two times a day (BID) | ORAL | Status: DC

## 2013-09-15 NOTE — Progress Notes (Signed)
Patient here for labs only at 8:21am/rcoe

## 2013-09-15 NOTE — Progress Notes (Signed)
Physical exam:  History: Patient is here for his regular physical exam. His orthopedist, Dr. Tonita Cong, and [facility], had contacted me requesting that he be checked out to make sure that nothing was positive for marked myeloma due to his back problems. Other that that is really nothing major His history.   Past medical history:  Medication allergies: None  Current medications: Lisinopril, metformin, glipizide, fenofibrate,zocor and aspirin  past medical history:  Diabetes  Coronary artery bypass  Surgery:  Coronary bypass as noted above   Family history:  Mother is living, recent hip fracture, otherwise doing okay  Father is deceased  No major familial diseases except for mother's side of family has a lot of diabetes   Social history:  Patient is married. That is his only sexual partner. He does not smoke or use any drugs. 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   Review of systems:  Constitutional: Unremarkable  HEENT: Unremarkable  Respiratory: Unremarkable  Cardiovascular: Unremarkable  Gastrointestinal: Intermittent diarrhea  Genitourinary: Unremarkable  Musculoskeletal: Joint aches and pains. His back pain is what limits some of his activities.  Dermatologic: Unremarkable  Neurologic: Unremarkable  Hematologic: Unremarkable  Psychiatric: Unremarkable  Physical examination: Well-developed well-nourished man in no major distress. Moderately overweight. TMs are normal. Eyes PERRLA. Fundi benign. Throat clear. Neck supple without nodes thyromegaly. No carotid bruits. Chest clear to auscultation. Heart regular without murmurs gallops or arrhythmias. And soft without mass or tenderness. Normal male external genitalia with no hernias. Digital rectal exam was done and his prostate gland is normal.  Extremities are unremarkable. Skin unremarkable. Sensory normal in his feet except for missing 2 spots of sensitivity on  the medial tip of both large toes.   Assessment:  Physical examination  Diabetes  Hypertension  Hyperlipidemia  History of coronary disease and CABG  Arthralgias  Diarrhea Back pain--per request of the orthopedic doctor studies are being done to make certain there is no evidence for myeloma. ED  Plan: Some of the labs that were drawn earlier this morning are still pending. I will and know the results of them.      Results for orders placed in visit on 09/15/13  COMPLETE METABOLIC PANEL WITH GFR      Result Value Ref Range   Sodium 138  135 - 145 mEq/L   Potassium 4.5  3.5 - 5.3 mEq/L   Chloride 103  96 - 112 mEq/L   CO2 24  19 - 32 mEq/L   Glucose, Bld 182 (*) 70 - 99 mg/dL   BUN 23  6 - 23 mg/dL   Creat 1.27  0.50 - 1.35 mg/dL   Total Bilirubin 0.4  0.2 - 1.2 mg/dL   Alkaline Phosphatase 29 (*) 39 - 117 U/L   AST 21  0 - 37 U/L   ALT 32  0 - 53 U/L   Total Protein 7.0  6.0 - 8.3 g/dL   Albumin 4.6  3.5 - 5.2 g/dL   Calcium 9.5  8.4 - 10.5 mg/dL   GFR, Est African American 72     GFR, Est Non African American 62    PROTEIN ELECTROPHORESIS, SERUM      Result Value Ref Range   Total Protein, Serum Electrophoresis    6.0 - 8.3 g/dL   Albumin ELP    55.8 - 66.1 %   Alpha-1-Globulin    2.9 - 4.9 %   Alpha-2-Globulin  7.1 - 11.8 %   Beta Globulin    4.7 - 7.2 %   Beta 2    3.2 - 6.5 %   Gamma Globulin    11.1 - 18.8 %   M-Spike, %       SPE Interp.       COMMENT (PROTEIN ELECTROPHOR)      CBC WITH DIFFERENTIAL      Result Value Ref Range   WBC 4.2  4.0 - 10.5 K/uL   RBC 4.91  4.22 - 5.81 MIL/uL   Hemoglobin 13.4  13.0 - 17.0 g/dL   HCT 40.5  39.0 - 52.0 %   MCV 82.5  78.0 - 100.0 fL   MCH 27.3  26.0 - 34.0 pg   MCHC 33.1  30.0 - 36.0 g/dL   RDW 15.3  11.5 - 15.5 %   Platelets 198  150 - 400 K/uL   Neutrophils Relative % 50  43 - 77 %   Neutro Abs 2.1  1.7 - 7.7 K/uL   Lymphocytes Relative 38  12 - 46 %   Lymphs Abs 1.6  0.7 - 4.0 K/uL   Monocytes Relative 8   3 - 12 %   Monocytes Absolute 0.3  0.1 - 1.0 K/uL   Eosinophils Relative 3  0 - 5 %   Eosinophils Absolute 0.1  0.0 - 0.7 K/uL   Basophils Relative 1  0 - 1 %   Basophils Absolute 0.0  0.0 - 0.1 K/uL   Smear Review Criteria for review not met    C-REACTIVE PROTEIN      Result Value Ref Range   CRP <0.5  <0.60 mg/dL  BETA 2 MICROGLOBULINE, SERUM      Result Value Ref Range   Beta-2 Microglobulin    1.01 - 1.73 mg/L  POCT URINALYSIS DIPSTICK      Result Value Ref Range   Color, UA yellow     Clarity, UA clear     Glucose, UA neg     Bilirubin, UA neg     Ketones, UA neg     Spec Grav, UA >=1.030     Blood, UA neg     pH, UA 5.0     Protein, UA 30     Urobilinogen, UA 0.2     Nitrite, UA neg     Leukocytes, UA Negative    POCT UA - MICROSCOPIC ONLY      Result Value Ref Range   WBC, Ur, HPF, POC neg     RBC, urine, microscopic neg     Bacteria, U Microscopic trace     Mucus, UA neg     Epithelial cells, urine per micros 0-1     Crystals, Ur, HPF, POC neg     Casts, Ur, LPF, POC neg     Yeast, UA neg

## 2013-09-15 NOTE — Patient Instructions (Signed)
Continue current medications.  Use theCialis as discussed  I will let you know the remainder of your labs in a few days. I will try to get a copy faxed to Dr. Tonita Cong

## 2013-09-16 LAB — COMPLETE METABOLIC PANEL WITH GFR
ALT: 32 U/L (ref 0–53)
AST: 21 U/L (ref 0–37)
Albumin: 4.6 g/dL (ref 3.5–5.2)
Alkaline Phosphatase: 29 U/L — ABNORMAL LOW (ref 39–117)
BUN: 23 mg/dL (ref 6–23)
CO2: 24 mEq/L (ref 19–32)
Calcium: 9.5 mg/dL (ref 8.4–10.5)
Chloride: 103 mEq/L (ref 96–112)
Creat: 1.27 mg/dL (ref 0.50–1.35)
GFR, Est African American: 72 mL/min
GFR, Est Non African American: 62 mL/min
Glucose, Bld: 182 mg/dL — ABNORMAL HIGH (ref 70–99)
Potassium: 4.5 mEq/L (ref 3.5–5.3)
Sodium: 138 mEq/L (ref 135–145)
Total Bilirubin: 0.4 mg/dL (ref 0.2–1.2)
Total Protein: 7 g/dL (ref 6.0–8.3)

## 2013-09-16 LAB — C-REACTIVE PROTEIN: CRP: <0.5 mg/dL (ref <0.60)

## 2013-09-16 LAB — LIPID PANEL
Cholesterol: 123 mg/dL (ref 0–200)
HDL: 26 mg/dL — ABNORMAL LOW (ref >39)
LDL Cholesterol: 40 mg/dL (ref 0–99)
Total CHOL/HDL Ratio: 4.7 Ratio
Triglycerides: 283 mg/dL — ABNORMAL HIGH (ref <150)
VLDL: 57 mg/dL — ABNORMAL HIGH (ref 0–40)

## 2013-09-16 LAB — BETA 2 MICROGLOBULIN, SERUM: Beta-2 Microglobulin: 1.78 mg/L — ABNORMAL HIGH (ref 1.01–1.73)

## 2013-09-17 LAB — PROTEIN ELECTROPHORESIS, SERUM
Albumin ELP: 61.6 % (ref 55.8–66.1)
Alpha-1-Globulin: 3.8 % (ref 2.9–4.9)
Alpha-2-Globulin: 8.6 % (ref 7.1–11.8)
Beta 2: 5.4 % (ref 3.2–6.5)
Beta Globulin: 8.9 % — ABNORMAL HIGH (ref 4.7–7.2)
Gamma Globulin: 11.7 % (ref 11.1–18.8)
Total Protein, Serum Electrophoresis: 7 g/dL (ref 6.0–8.3)

## 2013-09-18 ENCOUNTER — Other Ambulatory Visit: Payer: Self-pay

## 2013-09-18 DIAGNOSIS — E785 Hyperlipidemia, unspecified: Secondary | ICD-10-CM

## 2013-09-18 MED ORDER — FENOFIBRATE 160 MG PO TABS: 160.0000 mg | ORAL_TABLET | Freq: Every day | ORAL | Status: DC

## 2013-09-20 ENCOUNTER — Encounter: Payer: Self-pay | Admitting: Family Medicine

## 2013-12-12 ENCOUNTER — Ambulatory Visit: Payer: BC Managed Care – PPO

## 2013-12-12 ENCOUNTER — Other Ambulatory Visit: Payer: Self-pay | Admitting: Family Medicine

## 2013-12-12 ENCOUNTER — Ambulatory Visit: Payer: BC Managed Care – PPO | Admitting: Family Medicine

## 2013-12-12 VITALS — BP 122/62 | HR 78 | Temp 98.4°F | Resp 16 | Ht 71.0 in | Wt 236.0 lb

## 2013-12-12 DIAGNOSIS — S20212A Contusion of left front wall of thorax, initial encounter: Secondary | ICD-10-CM

## 2013-12-12 DIAGNOSIS — R0781 Pleurodynia: Secondary | ICD-10-CM

## 2013-12-12 DIAGNOSIS — S20219A Contusion of unspecified front wall of thorax, initial encounter: Secondary | ICD-10-CM

## 2013-12-12 DIAGNOSIS — R079 Chest pain, unspecified: Secondary | ICD-10-CM

## 2013-12-12 MED ORDER — TRAMADOL HCL 50 MG PO TABS: 50.0000 mg | ORAL_TABLET | Freq: Three times a day (TID) | ORAL | Status: DC | PRN

## 2013-12-12 NOTE — Patient Instructions (Signed)
Use the tramadol as needed for pain.  Keep a close eye on your symptoms. If you are not getting better or start getting worse please call me or otherwise seek care.  Remember the tramadol can make you feel drowsy

## 2013-12-12 NOTE — Progress Notes (Signed)
[facility] [address], [place] [address] [phone]  Date:  12/12/2013   Name:  [name] Love   DOB:  1955-09-11   MRN:  [id number]  PCP:  Ruben Reason, MD    Chief Complaint: Chest Pain   History of Present Illness:  [name] Love is a 58 y.o. very pleasant male patient who presents with the following:  He was recently on a trip to [place]; on the 10th he fell while on a tour and landed on his camera which was on his left side (hanging from strap).  He notes that the area is still sore, and seems to be more sore over the last couple of days. This made him worry about it.  He has pain with deep breath, cough, sneeze, changing position (getting out of bed).    Recent A1c 7% Most recent cath 2007- he had CABG in 2007 as well.  He has done well from this standpoint. Never did have an MI, normal echo last year.   No GI symptoms, no nausea/ vomiting or abd pain No exertional CP; he feels that his pain is just coming from his MSK soreness   He is taking aleve BID right now- he also uses this for his back.  This helps some but not completley with his current pain  Patient Active Problem List   Diagnosis Date Noted  . Erectile dysfunction 09/15/2013  . Nonspecific abnormal electrocardiogram (ECG) (EKG) 06/10/2013  . Type II or unspecified type diabetes mellitus without mention of complication, not stated as uncontrolled 09/12/2012  . Coronary artery disease 07/14/2011  . History of coronary artery bypass surgery 07/14/2011  . DM (diabetes mellitus), type 2, uncontrolled with complications 84/13/2440  . HYPERLIPIDEMIA 02/23/2009  . OBESITY 02/23/2009  . HYPERTENSION 02/23/2009    Past Medical History  Diagnosis Date  . Coronary artery disease     post CABG x3 in 2007  . Diabetes mellitus   . Hyperlipidemia   . Hypertension   . Obesity     Past Surgical History  Procedure Laterality Date  . Cardiac catheterization  EF= 45-50%     EF= 45-50% -- Three-vessel coronary artery disease with high-grade lesions in the proximal left anterior descending artery and multiple lesions in the right coronary -- Low normal ejection fraction -- Stable exertional angina -- [name], M.D. [facility]  . Coronary artery bypass graft  02/19/2006      x3 (left internal mammary artery to LAD, left radial artery to posterior descending, saphenous vein graft to first diagonal), endoscopic vein harvest right leg -- SURGEON:  [name]. [name], M.D.  . Hernia repair      at 58 y/o  . Tripple bypass      History  Substance Use Topics  . Smoking status: Never Smoker   . Smokeless tobacco: Not on file  . Alcohol Use: 0.5 oz/week    1 drink(s) per week    Family History  Problem Relation Age of Onset  . Heart disease Mother   . Cancer Father     No Known Allergies  Medication list has been reviewed and updated.  Current Outpatient Prescriptions on File Prior to Visit  Medication Sig Dispense Refill  . aspirin 325 MG tablet Take 325 mg by mouth daily.        . fenofibrate 160 MG tablet Take 1 tablet (160 mg total) by mouth daily.  90 tablet  3  . glipiZIDE (GLIPIZIDE XL) 5 MG 24  hr tablet Take 1 tablet (5 mg total) by mouth daily.  30 tablet  12  . losartan (COZAAR) 50 MG tablet Take 1 tablet (50 mg total) by mouth daily.  90 tablet  3  . metFORMIN (GLUCOPHAGE) 1000 MG tablet Take 1 tablet (1,000 mg total) by mouth 2 (two) times daily with a meal.  60 tablet  12  . Omega-3 Fatty Acids (FISH OIL PO) Take 2,000 mg by mouth 2 (two) times daily.        . simvastatin (ZOCOR) 40 MG tablet Take 1 tablet (40 mg total) by mouth at bedtime.  30 tablet  5  . tadalafil (CIALIS) 5 MG tablet Take 1 tablet (5 mg total) by mouth daily as needed for erectile dysfunction.  30 tablet  2  . albuterol (PROVENTIL HFA;VENTOLIN HFA) 108 (90 BASE) MCG/ACT inhaler Inhale 2 puffs into the lungs every 6 (six) hours as needed for wheezing.  1 Inhaler  0  .  albuterol (PROVENTIL HFA;VENTOLIN HFA) 108 (90 BASE) MCG/ACT inhaler Inhale 2 puffs into the lungs every 4 (four) hours as needed for wheezing (cough, shortness of breath or wheezing.).  1 Inhaler  1  . sildenafil (VIAGRA) 100 MG tablet Use one half tablet 1-2 hours prior to anticipated intercourse  10 tablet  3   No current facility-administered medications on file prior to visit.    Review of Systems:  As per HPI- otherwise negative.   Physical Examination: Filed Vitals:   12/12/13 0746  BP: 122/62  Pulse: 78  Temp: 98.4 F (36.9 C)  Resp: 16   Filed Vitals:   12/12/13 0746  Height: 5\' 11"  (1.803 m)  Weight: 236 lb (107.049 kg)   Body mass index is 32.93 kg/(m^2). Ideal Body Weight: Weight in (lb) to have BMI = 25: 178.9  GEN: WDWN, NAD, Non-toxic, A & O x 3, obese, looks well HEENT: Atraumatic, Normocephalic. Neck supple. No masses, No LAD.  Bilateral TM wnl, oropharynx normal.  PEERL,EOMI.   Ears and Nose: No external deformity. CV: RRR, No M/G/R. No JVD. No thrill. No extra heart sounds. PULM: CTA B, no wheezes, crackles, rhonchi. No retractions. No resp. distress. No accessory muscle use. ABD: S, NT, ND. No rebound. No HSM.  Soreness over left ribs, but not in abdomen I am easily able to reproduce the pain he has noted in his chest by pressing on the lateral ribs of the left side, it is MSK in origin.  No bruise, swelling or redness  EXTR: No c/c/e NEURO Normal gait.  PSYCH: Normally interactive. Conversant. Not depressed or anxious appearing.  Calm demeanor.   [facility] reading (PRIMARY) by  Dr. Lorelei Pont. Left ribs: no definite fracture identified  CXR: s/p sternotomy, OW negative  LEFT RIBS AND CHEST - 3+ VIEW  COMPARISON: None.  FINDINGS: Sequelae of prior CABG are identified. Cardiac silhouette is likely within normal limits for size for degree of lung inflation. The lungs are mildly hypoinflated. No airspace consolidation, pleural effusion, or pneumothorax is  identified. No rib fracture is identified.  IMPRESSION: No rib fracture identified. Hypoinflation.  CHEST - 1 VIEW  COMPARISON: None.  FINDINGS: Single lateral radiograph of the chest demonstrate sequelae of prior CABG. The lungs are mildly hypoinflated. No airspace consolidation or pleural effusion is seen. Mild spondylosis is present in the thoracic spine.  IMPRESSION: No acute abnormality identified.  Assessment and Plan: Rib pain on left side - Plan: traMADol (ULTRAM) 50 MG tablet  Contusion of rib on  left side - Plan: DG Ribs Unilateral W/Chest Left, CANCELED: DG Chest 2 View  Discussed with pt in detail.  MSK pain in the left ribs after falling on an object 12 days ago.  At this point I do not see evidence of dangerous chest or abdominal pathology. He is concerned as his pain seems worse over the last 2 days.  We discussed doing a CT but decided to defer this and see how he does over the next few days.  He will seek care right away if worse, and try tramdol as needed for pain.    Signed Lamar Blinks, MD

## 2013-12-15 ENCOUNTER — Other Ambulatory Visit: Payer: Self-pay | Admitting: Interventional Cardiology

## 2014-01-12 ENCOUNTER — Other Ambulatory Visit: Payer: Self-pay | Admitting: Interventional Cardiology

## 2014-03-16 ENCOUNTER — Other Ambulatory Visit: Payer: Self-pay | Admitting: Interventional Cardiology

## 2014-03-26 ENCOUNTER — Other Ambulatory Visit: Payer: Self-pay | Admitting: Family Medicine

## 2014-04-12 ENCOUNTER — Other Ambulatory Visit: Payer: Self-pay | Admitting: Interventional Cardiology

## 2014-05-10 ENCOUNTER — Other Ambulatory Visit: Payer: Self-pay | Admitting: Interventional Cardiology

## 2014-06-08 ENCOUNTER — Other Ambulatory Visit: Payer: Self-pay | Admitting: Interventional Cardiology

## 2014-06-12 ENCOUNTER — Other Ambulatory Visit: Payer: Self-pay

## 2014-06-12 MED ORDER — SIMVASTATIN 40 MG PO TABS: ORAL_TABLET | ORAL | Status: DC

## 2014-07-03 ENCOUNTER — Other Ambulatory Visit: Payer: Self-pay | Admitting: Interventional Cardiology

## 2014-09-24 ENCOUNTER — Encounter: Payer: Self-pay | Admitting: Family Medicine

## 2014-09-24 ENCOUNTER — Ambulatory Visit (INDEPENDENT_AMBULATORY_CARE_PROVIDER_SITE_OTHER): Payer: BLUE CROSS/BLUE SHIELD | Admitting: Family Medicine

## 2014-09-24 VITALS — BP 130/70 | HR 85 | Temp 98.2°F | Resp 12 | Ht 70.0 in | Wt 233.0 lb

## 2014-09-24 DIAGNOSIS — L309 Dermatitis, unspecified: Secondary | ICD-10-CM | POA: Diagnosis not present

## 2014-09-24 DIAGNOSIS — N189 Chronic kidney disease, unspecified: Secondary | ICD-10-CM

## 2014-09-24 DIAGNOSIS — I2583 Coronary atherosclerosis due to lipid rich plaque: Secondary | ICD-10-CM

## 2014-09-24 DIAGNOSIS — Z0000 Encounter for general adult medical examination without abnormal findings: Secondary | ICD-10-CM | POA: Diagnosis not present

## 2014-09-24 DIAGNOSIS — IMO0002 Reserved for concepts with insufficient information to code with codable children: Secondary | ICD-10-CM

## 2014-09-24 DIAGNOSIS — E785 Hyperlipidemia, unspecified: Secondary | ICD-10-CM | POA: Diagnosis not present

## 2014-09-24 DIAGNOSIS — E119 Type 2 diabetes mellitus without complications: Secondary | ICD-10-CM

## 2014-09-24 DIAGNOSIS — I10 Essential (primary) hypertension: Secondary | ICD-10-CM

## 2014-09-24 DIAGNOSIS — E118 Type 2 diabetes mellitus with unspecified complications: Secondary | ICD-10-CM | POA: Diagnosis not present

## 2014-09-24 DIAGNOSIS — I2510 Atherosclerotic heart disease of native coronary artery without angina pectoris: Secondary | ICD-10-CM | POA: Diagnosis not present

## 2014-09-24 DIAGNOSIS — B351 Tinea unguium: Secondary | ICD-10-CM

## 2014-09-24 DIAGNOSIS — N5201 Erectile dysfunction due to arterial insufficiency: Secondary | ICD-10-CM

## 2014-09-24 DIAGNOSIS — E1165 Type 2 diabetes mellitus with hyperglycemia: Secondary | ICD-10-CM

## 2014-09-24 DIAGNOSIS — Z951 Presence of aortocoronary bypass graft: Secondary | ICD-10-CM | POA: Diagnosis not present

## 2014-09-24 LAB — MICROALBUMIN, URINE: Microalb, Ur: 9.3 mg/dL — ABNORMAL HIGH (ref <2.0)

## 2014-09-24 LAB — POCT SKIN KOH: Skin KOH, POC: NEGATIVE

## 2014-09-24 LAB — COMPREHENSIVE METABOLIC PANEL
ALT: 40 U/L (ref 0–53)
AST: 25 U/L (ref 0–37)
Albumin: 4.5 g/dL (ref 3.5–5.2)
Alkaline Phosphatase: 27 U/L — ABNORMAL LOW (ref 39–117)
BUN: 20 mg/dL (ref 6–23)
CO2: 22 mEq/L (ref 19–32)
Calcium: 9.3 mg/dL (ref 8.4–10.5)
Chloride: 106 mEq/L (ref 96–112)
Creat: 1.18 mg/dL (ref 0.50–1.35)
Glucose, Bld: 169 mg/dL — ABNORMAL HIGH (ref 70–99)
Potassium: 4.3 mEq/L (ref 3.5–5.3)
Sodium: 140 mEq/L (ref 135–145)
Total Bilirubin: 0.4 mg/dL (ref 0.2–1.2)
Total Protein: 6.8 g/dL (ref 6.0–8.3)

## 2014-09-24 LAB — LIPID PANEL
Cholesterol: 126 mg/dL (ref 0–200)
HDL: 23 mg/dL — ABNORMAL LOW (ref >=40)
LDL Cholesterol: 40 mg/dL (ref 0–99)
Total CHOL/HDL Ratio: 5.5 Ratio
Triglycerides: 315 mg/dL — ABNORMAL HIGH (ref <150)
VLDL: 63 mg/dL — ABNORMAL HIGH (ref 0–40)

## 2014-09-24 LAB — POCT GLYCOSYLATED HEMOGLOBIN (HGB A1C): Hemoglobin A1C: 8.6

## 2014-09-24 LAB — GLUCOSE, POCT (MANUAL RESULT ENTRY): POC Glucose: 170 mg/dl — AB (ref 70–99)

## 2014-09-24 MED ORDER — TADALAFIL 5 MG PO TABS: 5.0000 mg | ORAL_TABLET | Freq: Every day | ORAL | Status: DC | PRN

## 2014-09-24 MED ORDER — GLIPIZIDE ER 5 MG PO TB24: 5.0000 mg | ORAL_TABLET | Freq: Every day | ORAL | Status: DC

## 2014-09-24 MED ORDER — LOSARTAN POTASSIUM 50 MG PO TABS: 50.0000 mg | ORAL_TABLET | Freq: Every day | ORAL | Status: DC

## 2014-09-24 MED ORDER — FENOFIBRATE 160 MG PO TABS: 160.0000 mg | ORAL_TABLET | Freq: Every day | ORAL | Status: DC

## 2014-09-24 MED ORDER — TRIAMCINOLONE ACETONIDE 0.1 % EX CREA: 1.0000 "application " | TOPICAL_CREAM | Freq: Two times a day (BID) | CUTANEOUS | Status: DC

## 2014-09-24 MED ORDER — SIMVASTATIN 40 MG PO TABS: ORAL_TABLET | ORAL | Status: DC

## 2014-09-24 MED ORDER — METFORMIN HCL 1000 MG PO TABS: 1000.0000 mg | ORAL_TABLET | Freq: Two times a day (BID) | ORAL | Status: DC

## 2014-09-24 NOTE — Progress Notes (Signed)
Physical exam:  History: Patient is here for his regular physical exam. No other major issues over the past year. 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 He has not seen his cardiologist for over a year. He has not been having any problems. He needs his medicines refilled.  Past medical history:  Medication allergies: None  Current medications:losartan, metformin, glipizide, fenofibrate,zocor and aspirin  past medical history:  Diabetes  Coronary artery bypass  Surgery:  Coronary bypass as noted above  Family history:  Mother is living, recent hip fracture, otherwise doing okay  Father is deceased  No major familial diseases except for mother's side of family has a lot of diabetes   Social history:  Patient is married. That is his only sexual partner. He does not smoke or use any drugs. 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 He gets a lot of exercise recently and just has put together, hopes to be using it soon.  Review of systems:  Constitutional: Unremarkable  HEENT: Unremarkable  Respiratory: Unremarkable  Cardiovascular: Unremarkable  Gastrointestinal: Intermittent diarrhea which she is done for a long time. He is able to handle it by taking an Imodium if needed, not very often. Genitourinary: Unremarkable  Musculoskeletal: Joint aches and pains. His back pain is what limits some of his activities.  Dermatologic: Rash on right ankle. One of his toenails has fallen off a couple times. Neurologic: Unremarkable  Hematologic: Unremarkable  Psychiatric: Unremarkable  Physical examination: Well-developed well-nourished man in no major distress. Moderately overweight. TMs are normal. Eyes PERRLA. Fundi benign. Throat clear. Neck supple without nodes thyromegaly. No carotid bruits. Chest clear to auscultation. Heart regular without murmurs gallops or  arrhythmias. And soft without mass or tenderness. Normal male external genitalia with no hernias. Digital rectal exam was done and his prostate gland is normal. Extremities are unremarkable. Skin unremarkable. Skin scraping was done from his medial left foot. He has a rash on his right ankle and some on both feet. He has a couple of slow healing abrasions on right elbow and on his right ankle.  Assessment:  Physical examination  Diabetes  Hypertension  Hyperlipidemia  History of coronary disease and CABG  Arthralgias  Diarrhea ED Onychomycosis all nails on toes Dermatitis feet and ankles  Plan: Refill medicine Skin scraping  Results for orders placed or performed in visit on 09/24/14  POCT glucose (manual entry)  Result Value Ref Range   POC Glucose 170 (A) 70 - 99 mg/dl  POCT glycosylated hemoglobin (Hb A1C)  Result Value Ref Range   Hemoglobin A1C 8.6   POCT Skin KOH  Result Value Ref Range   Skin KOH, POC Negative    Treat with triamcinolone cream for the ankles  Return in 4 months to follow-up on the diabetes since it is not well controlled today. Will not increase medications, but if A1c continues to stay high will need to add another agent.  Several services from the physical provided with regard to his diabetes and skin disorders.

## 2014-09-24 NOTE — Patient Instructions (Signed)
The diabetes is not in good control today. Advise working hard on regular exercise and avoidance of sweets and excessive starches.  Return in 4 months for recheck  Other medications are being left the same  Use the cream on the feet and ankles twice daily for the rash

## 2014-09-28 ENCOUNTER — Other Ambulatory Visit: Payer: Self-pay | Admitting: Family Medicine

## 2014-09-29 ENCOUNTER — Telehealth: Payer: Self-pay

## 2014-09-29 NOTE — Telephone Encounter (Signed)
[name].  He wanted to know if we could have it stopped and switch it to [employer].  I spoke to clinical, was told it was to late and had been sent out.  I have called the patient back to let him know.  [phone]

## 2014-10-01 NOTE — Telephone Encounter (Signed)
Left message for pt to call back  °

## 2014-10-05 ENCOUNTER — Encounter: Payer: Self-pay | Admitting: Family Medicine

## 2014-11-24 ENCOUNTER — Telehealth: Payer: Self-pay

## 2014-11-24 NOTE — Telephone Encounter (Signed)
Patient needs clearance from Dr Linna Darner to have cataract surgery on 12/17/14.

## 2014-11-25 NOTE — Telephone Encounter (Signed)
Yes patient needs to come in for another appt for surgery clearance.

## 2014-11-25 NOTE — Telephone Encounter (Signed)
lmom to notify pt that he needs to rtc.

## 2014-11-25 NOTE — Telephone Encounter (Signed)
Pt was just seen on 3/8 for CPE. Does he need to come in for medical clearance?

## 2014-12-19 ENCOUNTER — Encounter: Payer: Self-pay | Admitting: Family Medicine

## 2015-02-04 ENCOUNTER — Encounter: Payer: Self-pay | Admitting: Interventional Cardiology

## 2015-02-04 ENCOUNTER — Ambulatory Visit (INDEPENDENT_AMBULATORY_CARE_PROVIDER_SITE_OTHER): Payer: BLUE CROSS/BLUE SHIELD | Admitting: Interventional Cardiology

## 2015-02-04 VITALS — BP 130/62 | HR 73 | Ht 70.0 in | Wt 235.8 lb

## 2015-02-04 DIAGNOSIS — I2510 Atherosclerotic heart disease of native coronary artery without angina pectoris: Secondary | ICD-10-CM | POA: Diagnosis not present

## 2015-02-04 DIAGNOSIS — I10 Essential (primary) hypertension: Secondary | ICD-10-CM | POA: Diagnosis not present

## 2015-02-04 DIAGNOSIS — I2583 Coronary atherosclerosis due to lipid rich plaque: Secondary | ICD-10-CM

## 2015-02-04 MED ORDER — ASPIRIN EC 81 MG PO TBEC: 81.0000 mg | DELAYED_RELEASE_TABLET | Freq: Every day | ORAL | Status: DC

## 2015-02-04 NOTE — Patient Instructions (Addendum)
**Note De-Identified [name] Love Obfuscation** Medication Instructions:  Decrease Aspirin to 81 mg daily  Labwork: None  Testing/Procedures: None  Follow-Up: Your physician wants you to follow-up in: 1 year. You will receive a reminder letter in the mail two months in advance. If you don't receive a letter, please call our office to schedule the follow-up appointment.

## 2015-02-04 NOTE — Progress Notes (Signed)
Patient ID: [name] Love, male   DOB: 1956/05/22, 59 y.o.   MRN: [id number]     Cardiology Office Note   Date:  02/04/2015   ID:  [name] Love, DOB [birth year], MRN [id number]  PCP:  Ruben Reason, MD    Chief Complaint  Patient presents with  . Follow-up    CAD     Wt Readings from Last 3 Encounters:  02/04/15 235 lb 12.8 oz (106.958 kg)  09/24/14 233 lb (105.688 kg)  12/12/13 236 lb (107.049 kg)       History of Present Illness: [name] Love is a 59 y.o. male  who had CABG in 2007. He has done well since that time. He had a nuclear stress test in 2012 that was ok. He  typically goes to the gym 2-3 days /week,  But has not of late due to cataract surgery. He will do cardio and weights. No CP , SHOB. No sx like what he had prior to CABG.   No orthopnea, edema. No PND. No lightheadedness or syncope. DM control has been less than optimal.   he wants to get back into his exercise soon.   Past Medical History  Diagnosis Date  . Coronary artery disease     post CABG x3 in 2007  . Diabetes mellitus   . Hyperlipidemia   . Hypertension   . Obesity     Past Surgical History  Procedure Laterality Date  . Cardiac catheterization  EF= 45-50%    EF= 45-50% -- Three-vessel coronary artery disease with high-grade lesions in the proximal left anterior descending artery and multiple lesions in the right coronary -- Low normal ejection fraction -- Stable exertional angina -- [name], M.D. [facility]  . Coronary artery bypass graft  02/19/2006      x3 (left internal mammary artery to LAD, left radial artery to posterior descending, saphenous vein graft to first diagonal), endoscopic vein harvest right leg -- SURGEON:  [name]. [name], M.D.  . Hernia repair      at 59 y/o  . Tripple bypass       Current Outpatient Prescriptions  Medication Sig Dispense Refill  . albuterol (PROVENTIL HFA;VENTOLIN HFA) 108 (90 BASE) MCG/ACT inhaler  Inhale 2 puffs into the lungs every 4 (four) hours as needed for wheezing (cough, shortness of breath or wheezing.). 1 Inhaler 1  . aspirin 325 MG tablet Take 325 mg by mouth daily.      . DUREZOL 0.05 % EMUL Place 1 drop into the left eye daily.    . fenofibrate 160 MG tablet Take 1 tablet (160 mg total) by mouth daily. 90 tablet 3  . glipiZIDE (GLIPIZIDE XL) 5 MG 24 hr tablet Take 1 tablet (5 mg total) by mouth daily. 30 tablet 12  . losartan (COZAAR) 50 MG tablet Take 1 tablet (50 mg total) by mouth daily. 90 tablet 3  . metFORMIN (GLUCOPHAGE) 1000 MG tablet Take 1 tablet (1,000 mg total) by mouth 2 (two) times daily with a meal. 60 tablet 12  . Omega-3 Fatty Acids (FISH OIL PO) Take 2,000 mg by mouth 2 (two) times daily.      Marland Kitchen PROLENSA 0.07 % SOLN Place 1 drop into the left eye daily.    . tadalafil (CIALIS) 5 MG tablet Take 1 tablet (5 mg total) by mouth daily as needed for erectile dysfunction. 30 tablet 2  . simvastatin (ZOCOR) 40 MG tablet Take one daily after evening meal for cholesterol (Patient not taking: Reported on  02/04/2015) 30 tablet 12  . triamcinolone cream (KENALOG) 0.1 % Apply 1 application topically 2 (two) times daily. (Patient not taking: Reported on 02/04/2015) 45 g 0   No current facility-administered medications for this visit.    Allergies:   Review of patient's allergies indicates no known allergies.    Social History:  The patient  reports that he has never smoked. He has never used smokeless tobacco. He reports that he drinks about 0.6 oz of alcohol per week. He reports that he does not use illicit drugs.   Family History:  The patient's *family history includes Cancer in his father; Heart disease in his mother.    ROS:  Please see the history of present illness.   Otherwise, review of systems are positive for  Recent cataract surgery.   All other systems are reviewed and negative.    PHYSICAL EXAM: VS:  BP 130/62 mmHg  Pulse 73  Ht 5\' 10"  (1.778 m)  Wt  235 lb 12.8 oz (106.958 kg)  BMI 33.83 kg/m2 , BMI Body mass index is 33.83 kg/(m^2). GEN: Well nourished, well developed, in no acute distress HEENT: normal Neck: no JVD, carotid bruits, or masses Cardiac: RRR; no murmurs, rubs, or gallops,no edema  Respiratory:  clear to auscultation bilaterally, normal work of breathing GI: soft, nontender, nondistended, + BS MS: no deformity or atrophy Skin: warm and dry, no rash Neuro:  Strength and sensation are intact Psych: euthymic mood, full affect   EKG:   The ekg ordered today demonstrates NSR, laateral ST depression-no change from 2014 ECG   Recent Labs: 09/24/2014: ALT 40; BUN 20; Creat 1.18; Potassium 4.3; Sodium 140   Lipid Panel    Component Value Date/Time   CHOL 126 09/24/2014 0901   TRIG 315* 09/24/2014 0901   HDL 23* 09/24/2014 0901   CHOLHDL 5.5 09/24/2014 0901   VLDL 63* 09/24/2014 0901   LDLCALC 40 09/24/2014 0901     Other studies Reviewed: Additional studies/ records that were reviewed today with results demonstrating:  Nuclear stress test 2012.   ASSESSMENT AND PLAN:  1.  coronary artery disease: No angina.   No symptoms to what he had prior to CABG. 2.  hyperlipidemia: Triglycerides elevated. The should come down when his glucose improves. Other readings were adequately controlled. 3.  abnormal ECG: Stable. LVEF 55-60 in 2014. 4.  obesity: I asked him to try and get down to 210 pounds by the time of his visit next year. He can improve his diet and increase exercise. He will let us know if he has any cardiac symptoms. 5.  hypertension: Blood pressure well controlled. Continue current medications   Current medicines are reviewed at length with the patient today.  The patient concerns regarding his medicines were addressed.  The following changes have been made:  No change  Labs/ tests ordered today include:  No orders of the defined types were placed in this encounter.    Recommend 150 minutes/week of  aerobic exercise Low fat, low carb, high fiber diet recommended  Disposition:   FU in 1 year   Teresita Madura., MD  02/04/2015 4:25 PM    [facility] [place], [place], [place]  [address] Phone: [phone]; Fax: [phone]

## 2015-03-01 ENCOUNTER — Encounter: Payer: Self-pay | Admitting: *Deleted

## 2015-04-07 IMAGING — CR DG CHEST 1V
1 series · 1 of 1 positions shown · non-contrast
Comparison: None.

CLINICAL DATA: Fall onto left ribs.

EXAM:
CHEST - 1 VIEW

[PA]
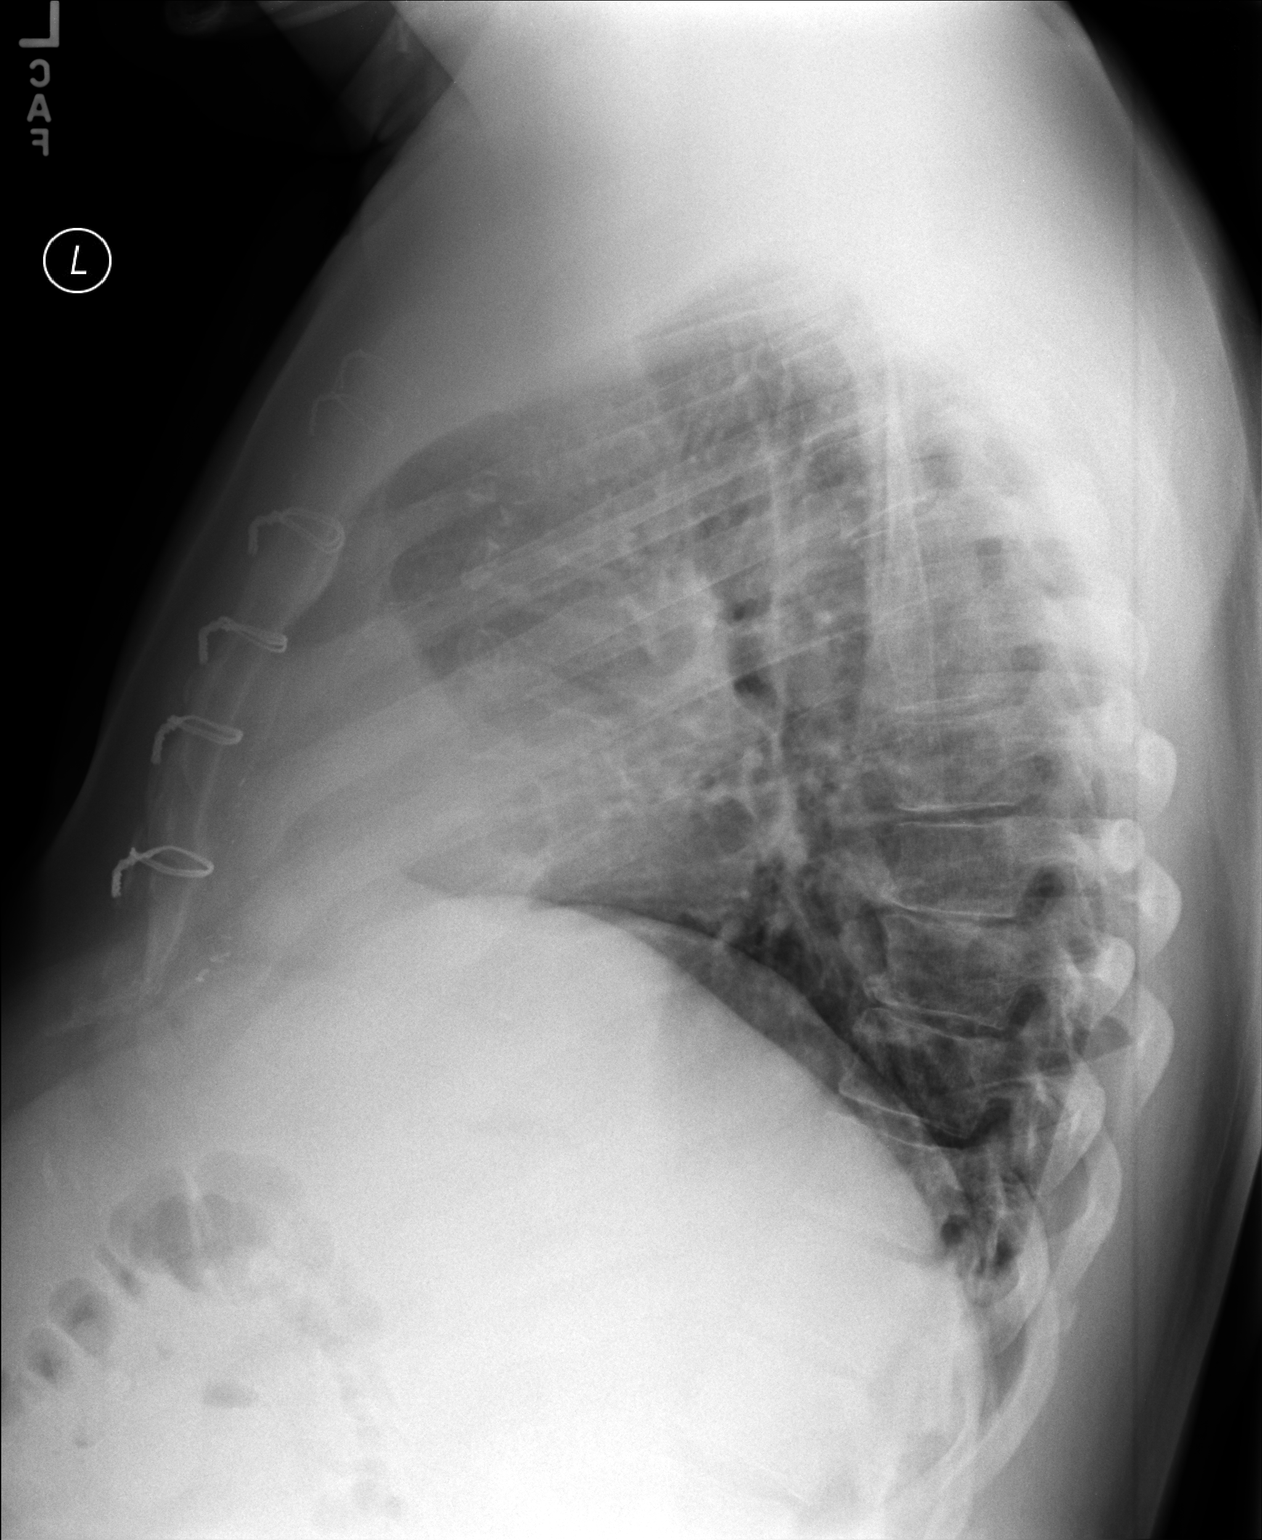

[1 of 1 positions shown; findings below may reference images not displayed]

FINDINGS: Single lateral radiograph of the chest demonstrate sequelae of prior
CABG. The lungs are mildly hypoinflated. No airspace consolidation
or pleural effusion is seen. Mild spondylosis is present in the
thoracic spine.
IMPRESSION: No acute abnormality identified.

## 2015-07-02 ENCOUNTER — Telehealth: Payer: Self-pay | Admitting: Interventional Cardiology

## 2015-07-02 NOTE — Telephone Encounter (Signed)
New Message      Pt calling stating that Dr. Onnie Graham at [facility] faxed over a surgical clearance form and the pt is wanting to know when it will be signed and sent back because the pt can't schedule his procedure until that is done. Please call back and advise.

## 2015-07-05 NOTE — Telephone Encounter (Signed)
Form was not in my folder on Friday.

## 2015-07-06 NOTE — Telephone Encounter (Signed)
No further cardiac testing needed before shoulder surgery.

## 2015-07-06 NOTE — Telephone Encounter (Signed)
Spoke with Abigail Butts at [facility].  Fax was sent to [phone]. I told Abigail Butts we did not receive as this is not correct fax number.  New fax number of [phone] given. Abigail Butts will refax form for Dr. Irish Lack to complete.  Abigail Butts made aware Dr. Irish Lack will not be back in office until 07/13/15.   I told Abigail Butts pt did not need cardiac testing prior to surgery.  She is requesting I fax this phone note, last office visit and EKG to her at [phone]. Will fax this information.

## 2015-07-06 NOTE — Telephone Encounter (Signed)
Follow Up  Request for surgical clearance:  1. What type of surgery is being performed? Shoulder surgery   2. When is this surgery scheduled? Not until form is received. Must be completed this year   3. Are there any medications that need to be held prior to surgery and how long? No sure   4. Name of physician performing surgery? Dr. Onnie Graham with [facility] orthopedic   5. What is your office phone and fax number? Tel [phone] (wendy is the scheduler)   Comments: Per pt follow Up. He states the form was sent 2 weeks ago with no response. Please assist

## 2015-07-13 NOTE — Telephone Encounter (Signed)
**Note De-Identified Shenelle Klas Obfuscation** Cardiac clearance form signed by Dr Irish Lack faxed back to [place] at [phone]. Received conformation that the fax went through successfully.

## 2015-09-20 ENCOUNTER — Ambulatory Visit (INDEPENDENT_AMBULATORY_CARE_PROVIDER_SITE_OTHER): Payer: BLUE CROSS/BLUE SHIELD | Admitting: Physician Assistant

## 2015-09-20 VITALS — BP 128/70 | HR 99 | Temp 97.5°F | Resp 18 | Ht 70.5 in | Wt 236.0 lb

## 2015-09-20 DIAGNOSIS — R0989 Other specified symptoms and signs involving the circulatory and respiratory systems: Secondary | ICD-10-CM

## 2015-09-20 DIAGNOSIS — J4521 Mild intermittent asthma with (acute) exacerbation: Secondary | ICD-10-CM

## 2015-09-20 MED ORDER — HYDROCOD POLST-CPM POLST ER 10-8 MG/5ML PO SUER: 5.0000 mL | Freq: Two times a day (BID) | ORAL | Status: DC | PRN

## 2015-09-20 MED ORDER — ALBUTEROL SULFATE HFA 108 (90 BASE) MCG/ACT IN AERS: 2.0000 | INHALATION_SPRAY | RESPIRATORY_TRACT | Status: DC | PRN

## 2015-09-20 MED ORDER — AZITHROMYCIN 250 MG PO TABS: ORAL_TABLET | ORAL | Status: AC

## 2015-09-20 MED ORDER — BENZONATATE 100 MG PO CAPS: 100.0000 mg | ORAL_CAPSULE | Freq: Three times a day (TID) | ORAL | Status: DC | PRN

## 2015-09-20 NOTE — Patient Instructions (Signed)
Drink plenty of water (64 oz/day) and get plenty of rest. If you have been prescribed a cough syrup, do not drive or operate heavy machinery while using this medication. Take tessalon during the day for cough Use albuterol every 4 hours as needed for wheezing/coughing fits. Take zpak as directed. If your symptoms are not improving in 1 week, return to clinic.

## 2015-09-20 NOTE — Progress Notes (Signed)
[facility] [address] [place] [address] [phone]  Date:  09/20/2015   Name:  [name] Love   DOB:  1955/11/19   MRN:  [id number]  PCP:  Ruben Reason, MD    Chief Complaint: Nasal Congestion; Cough; and Flu Vaccine   History of Present Illness:  This is a 60 y.o. male with PMH DM2. HTN, CAD, HLD, ED who is presenting with nasal congestion and dry cough x 1 week. States he had a cough 3 weeks ago, went away for 1 week. Came back 1 week ago. Started wheezing 1 week. States colds for him tend to turn into asthmatic bronchitis. Denies sore throat, fever or chills. Feeling "sick" off and on. Cough is disturbing sleep.  Aggravating/alleviating factors: Take robitussin cough syrup, not helping. Not using inhaler bc ran out. History of asthma: yes, mild int - only with illnesses. History of env allergies: no Tobacco use: no   Review of Systems:  Review of Systems See HPI  Patient Active Problem List   Diagnosis Date Noted  . Erectile dysfunction 09/15/2013  . Nonspecific abnormal electrocardiogram (ECG) (EKG) 06/10/2013  . Type II or unspecified type diabetes mellitus without mention of complication, not stated as uncontrolled 09/12/2012  . Coronary artery disease 07/14/2011  . History of coronary artery bypass surgery 07/14/2011  . DM (diabetes mellitus), type 2, uncontrolled with complications ([place]) [id number]  . Hyperlipidemia 02/23/2009  . OBESITY 02/23/2009  . Essential hypertension 02/23/2009    Prior to Admission medications   Medication Sig Start Date End Date Taking? Authorizing Provider  albuterol (PROVENTIL HFA;VENTOLIN HFA) 108 (90 BASE) MCG/ACT inhaler Inhale 2 puffs into the lungs every 4 (four) hours as needed for wheezing (cough, shortness of breath or wheezing.). 08/07/13  Yes [name], MD  aspirin EC 81 MG tablet Take 1 tablet (81 mg total) by mouth daily. 02/04/15  Yes [name], MD  fenofibrate 160 MG  tablet Take 1 tablet (160 mg total) by mouth daily. 09/24/14  Yes [name], MD  glipiZIDE (GLIPIZIDE XL) 5 MG 24 hr tablet Take 1 tablet (5 mg total) by mouth daily. 09/24/14  Yes [name], MD  losartan (COZAAR) 50 MG tablet Take 1 tablet (50 mg total) by mouth daily. 09/24/14  Yes [name], MD  metFORMIN (GLUCOPHAGE) 1000 MG tablet Take 1 tablet (1,000 mg total) by mouth 2 (two) times daily with a meal. 09/24/14  Yes [name], MD  Omega-3 Fatty Acids (FISH OIL PO) Take 2,000 mg by mouth 2 (two) times daily.     Yes Historical Provider, MD  simvastatin (ZOCOR) 40 MG tablet Take one daily after evening meal for cholesterol 09/24/14  Yes [name], MD  DUREZOL 0.05 % EMUL Place 1 drop into the left eye daily. Reported on 09/20/2015 12/18/14   Historical Provider, MD  PROLENSA 0.07 % SOLN Place 1 drop into the left eye daily. Reported on 09/20/2015 12/18/14   Historical Provider, MD  tadalafil (CIALIS) 5 MG tablet Take 1 tablet (5 mg total) by mouth daily as needed for erectile dysfunction. Patient not taking: Reported on 09/20/2015 09/24/14   [name], MD    No Known Allergies  Past Surgical History  Procedure Laterality Date  . Cardiac catheterization  EF= 45-50%    EF= 45-50% -- Three-vessel coronary artery disease with high-grade lesions in the proximal left anterior descending artery and multiple lesions in the right coronary -- Low  normal ejection fraction -- Stable exertional angina -- [name], M.D. [facility]  . Coronary artery bypass graft  02/19/2006      x3 (left internal mammary artery to LAD, left radial artery to posterior descending, saphenous vein graft to first diagonal), endoscopic vein harvest right leg -- SURGEON:  [name]. [name], M.D.  . Hernia repair      at 60 y/o  . Tripple bypass      Social History  Substance Use Topics  . Smoking status: Never Smoker   . Smokeless tobacco: Never Used  . Alcohol Use: 0.6 oz/week    1 Standard drinks or  equivalent per week    Family History  Problem Relation Age of Onset  . Heart disease Mother   . Cancer Father     Medication list has been reviewed and updated.  Physical Examination:  Physical Exam  Constitutional: He is oriented to person, place, and time. He appears well-developed and well-nourished. No distress.  HENT:  Head: Normocephalic and atraumatic.  Right Ear: Hearing, tympanic membrane, external ear and ear canal normal.  Left Ear: Hearing, tympanic membrane, external ear and ear canal normal.  Nose: Nose normal.  Mouth/Throat: Uvula is midline, oropharynx is clear and moist and mucous membranes are normal.  Eyes: Conjunctivae and lids are normal. Right eye exhibits no discharge. Left eye exhibits no discharge. No scleral icterus.  Cardiovascular: Normal rate, regular rhythm, normal heart sounds and normal pulses.   No murmur heard. Pulmonary/Chest: Effort normal. No respiratory distress. He has no wheezes. He has no rhonchi. He has rales (left base).  Musculoskeletal: Normal range of motion.  Lymphadenopathy:       Head (right side): No submental, no submandibular and no tonsillar adenopathy present.       Head (left side): No submental, no submandibular and no tonsillar adenopathy present.    He has no cervical adenopathy.  Neurological: He is alert and oriented to person, place, and time.  Skin: Skin is warm, dry and intact. No lesion and no rash noted.  Psychiatric: He has a normal mood and affect. His speech is normal and behavior is normal. Thought content normal.   BP 128/70 mmHg  Pulse 99  Temp(Src) 97.5 F (36.4 C) (Oral)  Resp 18  Ht 5' 10.5" (1.791 m)  Wt 236 lb (107.049 kg)  BMI 33.37 kg/m2  SpO2 97%  Assessment and Plan:  1. Asthmatic bronchitis, mild intermittent, with acute exacerbation 2. Rales  Rales on exam. Will treat for possible pneumonia vs bronchitis with zpak. Tussionex and tessalon for symptom control. Surprisingly no wheezing on  exam. Use albuterol as needed. Return in 1 week if symptoms do not improve or at any time if symptoms worsen.  - albuterol (PROVENTIL HFA;VENTOLIN HFA) 108 (90 Base) MCG/ACT inhaler; Inhale 2 puffs into the lungs every 4 (four) hours as needed for wheezing (cough, shortness of breath or wheezing.).  Dispense: 1 Inhaler; Refill: 1 - chlorpheniramine-HYDROcodone (TUSSIONEX PENNKINETIC ER) 10-8 MG/5ML SUER; Take 5 mLs by mouth every 12 (twelve) hours as needed for cough.  Dispense: 100 mL; Refill: 0 - benzonatate (TESSALON) 100 MG capsule; Take 1-2 capsules (100-200 mg total) by mouth 3 (three) times daily as needed for cough.  Dispense: 40 capsule; Refill: 0 - azithromycin (ZITHROMAX) 250 MG tablet; Take 2 tabs PO x 1 dose, then 1 tab PO QD x 4 days  Dispense: 6 tablet; Refill: 0   [name]. Drenda Freeze, MHS [facility]  09/20/2015

## 2015-10-08 ENCOUNTER — Ambulatory Visit (INDEPENDENT_AMBULATORY_CARE_PROVIDER_SITE_OTHER): Payer: Managed Care, Other (non HMO) | Admitting: Family Medicine

## 2015-10-08 ENCOUNTER — Ambulatory Visit (INDEPENDENT_AMBULATORY_CARE_PROVIDER_SITE_OTHER): Payer: Managed Care, Other (non HMO)

## 2015-10-08 VITALS — BP 122/68 | HR 90 | Temp 98.6°F | Resp 16 | Ht 70.5 in | Wt 235.0 lb

## 2015-10-08 DIAGNOSIS — I2510 Atherosclerotic heart disease of native coronary artery without angina pectoris: Secondary | ICD-10-CM

## 2015-10-08 DIAGNOSIS — E785 Hyperlipidemia, unspecified: Secondary | ICD-10-CM

## 2015-10-08 DIAGNOSIS — E118 Type 2 diabetes mellitus with unspecified complications: Secondary | ICD-10-CM | POA: Diagnosis not present

## 2015-10-08 DIAGNOSIS — R05 Cough: Secondary | ICD-10-CM | POA: Diagnosis not present

## 2015-10-08 DIAGNOSIS — Z951 Presence of aortocoronary bypass graft: Secondary | ICD-10-CM | POA: Diagnosis not present

## 2015-10-08 DIAGNOSIS — E669 Obesity, unspecified: Secondary | ICD-10-CM

## 2015-10-08 DIAGNOSIS — E1165 Type 2 diabetes mellitus with hyperglycemia: Secondary | ICD-10-CM

## 2015-10-08 DIAGNOSIS — B351 Tinea unguium: Secondary | ICD-10-CM

## 2015-10-08 DIAGNOSIS — I10 Essential (primary) hypertension: Secondary | ICD-10-CM

## 2015-10-08 DIAGNOSIS — Z0000 Encounter for general adult medical examination without abnormal findings: Secondary | ICD-10-CM | POA: Diagnosis not present

## 2015-10-08 DIAGNOSIS — I2583 Coronary atherosclerosis due to lipid rich plaque: Secondary | ICD-10-CM

## 2015-10-08 DIAGNOSIS — J4521 Mild intermittent asthma with (acute) exacerbation: Secondary | ICD-10-CM

## 2015-10-08 DIAGNOSIS — N5201 Erectile dysfunction due to arterial insufficiency: Secondary | ICD-10-CM

## 2015-10-08 DIAGNOSIS — IMO0002 Reserved for concepts with insufficient information to code with codable children: Secondary | ICD-10-CM

## 2015-10-08 DIAGNOSIS — R059 Cough, unspecified: Secondary | ICD-10-CM

## 2015-10-08 LAB — POCT CBC
Granulocyte percent: 71.3 %G (ref 37–80)
HCT, POC: 39.5 % — AB (ref 43.5–53.7)
Hemoglobin: 13.9 g/dL — AB (ref 14.1–18.1)
Lymph, poc: 0.6 (ref 0.6–3.4)
MCH, POC: 28.5 pg (ref 27–31.2)
MCHC: 35.3 g/dL (ref 31.8–35.4)
MCV: 80.7 fL (ref 80–97)
MID (cbc): 0.3 (ref 0–0.9)
MPV: 8.5 fL (ref 0–99.8)
POC Granulocyte: 2.4 (ref 2–6.9)
POC LYMPH PERCENT: 19.2 %L (ref 10–50)
POC MID %: 9.5 %M (ref 0–12)
Platelet Count, POC: 144 10*3/uL (ref 142–424)
RBC: 4.9 M/uL (ref 4.69–6.13)
RDW, POC: 14.5 %
WBC: 3.3 10*3/uL — AB (ref 4.6–10.2)

## 2015-10-08 LAB — COMPREHENSIVE METABOLIC PANEL
ALT: 59 U/L — ABNORMAL HIGH (ref 9–46)
AST: 45 U/L — ABNORMAL HIGH (ref 10–35)
Albumin: 4.2 g/dL (ref 3.6–5.1)
Alkaline Phosphatase: 33 U/L — ABNORMAL LOW (ref 40–115)
BUN: 17 mg/dL (ref 7–25)
CO2: 26 mmol/L (ref 20–31)
Calcium: 8.9 mg/dL (ref 8.6–10.3)
Chloride: 103 mmol/L (ref 98–110)
Creat: 1.25 mg/dL (ref 0.70–1.33)
Glucose, Bld: 220 mg/dL — ABNORMAL HIGH (ref 65–99)
Potassium: 4.6 mmol/L (ref 3.5–5.3)
Sodium: 139 mmol/L (ref 135–146)
Total Bilirubin: 0.3 mg/dL (ref 0.2–1.2)
Total Protein: 6.9 g/dL (ref 6.1–8.1)

## 2015-10-08 LAB — POCT URINALYSIS DIP (MANUAL ENTRY)
Bilirubin, UA: NEGATIVE
Blood, UA: NEGATIVE
Glucose, UA: 500 — AB
Ketones, POC UA: NEGATIVE
Leukocytes, UA: NEGATIVE
Nitrite, UA: NEGATIVE
Protein Ur, POC: 100 — AB
Spec Grav, UA: >=1.03
Urobilinogen, UA: 0.2
pH, UA: 5.5

## 2015-10-08 LAB — POC MICROSCOPIC URINALYSIS (UMFC): Mucus: ABSENT

## 2015-10-08 LAB — LIPID PANEL
Cholesterol: 102 mg/dL — ABNORMAL LOW (ref 125–200)
HDL: 19 mg/dL — ABNORMAL LOW (ref >=40)
LDL Cholesterol: 7 mg/dL (ref <130)
Total CHOL/HDL Ratio: 5.4 Ratio — ABNORMAL HIGH (ref <=5.0)
Triglycerides: 379 mg/dL — ABNORMAL HIGH (ref <150)
VLDL: 76 mg/dL — ABNORMAL HIGH (ref <30)

## 2015-10-08 LAB — MICROALBUMIN, URINE: Microalb, Ur: 17.3 mg/dL

## 2015-10-08 LAB — GLUCOSE, POCT (MANUAL RESULT ENTRY): POC Glucose: 229 mg/dl — AB (ref 70–99)

## 2015-10-08 LAB — POC HEMOCCULT BLD/STL (OFFICE/1-CARD/DIAGNOSTIC): Fecal Occult Blood, POC: NEGATIVE

## 2015-10-08 LAB — POCT GLYCOSYLATED HEMOGLOBIN (HGB A1C): Hemoglobin A1C: 9.3

## 2015-10-08 MED ORDER — HYDROCODONE-HOMATROPINE 5-1.5 MG/5ML PO SYRP: 5.0000 mL | ORAL_SOLUTION | ORAL | Status: DC | PRN

## 2015-10-08 MED ORDER — BENZONATATE 100 MG PO CAPS: 100.0000 mg | ORAL_CAPSULE | Freq: Three times a day (TID) | ORAL | Status: DC | PRN

## 2015-10-08 MED ORDER — CANAGLIFLOZIN 300 MG PO TABS: 300.0000 mg | ORAL_TABLET | Freq: Every day | ORAL | Status: DC

## 2015-10-08 MED ORDER — TERBINAFINE HCL 250 MG PO TABS: 250.0000 mg | ORAL_TABLET | Freq: Every day | ORAL | Status: DC

## 2015-10-08 MED ORDER — DOXYCYCLINE HYCLATE 100 MG PO CAPS: 100.0000 mg | ORAL_CAPSULE | Freq: Two times a day (BID) | ORAL | Status: DC

## 2015-10-08 MED ORDER — GLIPIZIDE ER 5 MG PO TB24: 5.0000 mg | ORAL_TABLET | Freq: Every day | ORAL | Status: DC

## 2015-10-08 MED ORDER — ALBUTEROL SULFATE HFA 108 (90 BASE) MCG/ACT IN AERS: 2.0000 | INHALATION_SPRAY | RESPIRATORY_TRACT | Status: DC | PRN

## 2015-10-08 MED ORDER — METFORMIN HCL 1000 MG PO TABS: 1000.0000 mg | ORAL_TABLET | Freq: Two times a day (BID) | ORAL | Status: DC

## 2015-10-08 MED ORDER — SIMVASTATIN 40 MG PO TABS: ORAL_TABLET | ORAL | Status: DC

## 2015-10-08 MED ORDER — LOSARTAN POTASSIUM 50 MG PO TABS: 50.0000 mg | ORAL_TABLET | Freq: Every day | ORAL | Status: DC

## 2015-10-08 MED ORDER — FENOFIBRATE 160 MG PO TABS: 160.0000 mg | ORAL_TABLET | Freq: Every day | ORAL | Status: DC

## 2015-10-08 NOTE — Progress Notes (Signed)
Patient ID: [name] Love, male    DOB: [birth year]  Age: 60 y.o. MRN: [id number]  Cough Diabetes  Subjective:   Patient was here for his annual physical examination. He needs to be checked up for his diabetes also, and needs he has cough addressed. He's had a prolonged cough for the last couple of months, he is been treated previously, but is bad involving him day and night. He does not smoke.  The has kept his weight about the same, not adhered extremely tightly to the diet. He takes his medicines for the diabetes regularly. No other new diabetic complaints.  He also complains of onychomycosis in all of his nails.  Current allergies, medications, problem list, past/family and social histories reviewed.  Objective:  BP 122/68 mmHg  Pulse 90  Temp(Src) 98.6 F (37 C) (Oral)  Resp 16  Ht 5' 10.5" (1.791 m)  Wt 235 lb (106.595 kg)  BMI 33.23 kg/m2  SpO2 98%  No major acute distress except he keeps coughing. Chest clear. Heart regular without murmur. Chest x-ray shows the old surgical wiring but no acute changes. He has diffuse neck mycosis on his large toenails as well as some other excoriated areas on his toes.  Assessment & Plan:   Assessment: Bronchitis/cough Diabetes mellitus Onychomycosis   Plan: Continue his diabetic medications. Because the A1c is 5 decided to go ahead and and Invokana 300 mg daily.  Discuss using the terbinafine. It is necessary to check liver enzymes after 30 days before I would be willing to continue him on it for another 2 months. Explained that it takes several months for the nails to grow back in normally still. This can be obtained year.  Treat the cough with antibiotics and see how he does. If he keeps on having problems he is to return. He has an inhaler, and it may help him since he says he wheezes some at night though he has no wheezing today.   Patient Instructions   Continue current meds  Advise following up on your diabetes  in about 3 months. Because of the new medicine I think it is important to get rechecked then  Get regular exercise  Work on weight loss  Take doxycycline 1 twice daily for infection  Take the Hycod[place] cough syrup 1 teaspoon every 4-6 hours as needed for nighttime cough or when you can afford to be sedated  Take the benzonatate cough pills one or 2 pills 3 times daily for daytime cough  Use the albuterol inhaler 2 inhalations every 4-6 hours if wheezing  Take the terbinafine 250 mg 1 pill daily for one month, then get blood drawn. If your liver enzymes are good I can call in for the additional 2 months. You should be on this for 3 months, and it can take up to 6 months to a year for the nails to grow out completely normally.   Take invokana 300 mg 1 each morning for diabetes   IF you received an x-ray today, you will receive an invoice from [facility]. Please contact [facility] at [phone] with questions or concerns regarding your invoice.   IF you received labwork today, you will receive an invoice from [employer]. Please contact [facility] at [phone] with questions or concerns regarding your invoice.   Our billing staff will not be able to assist you with questions regarding bills from these companies.  You will be contacted with the lab results as soon as they are available.  The fastest way to get your results is to activate your My Chart account. Instructions are located on the last page of this paperwork. If you have not heard from [place] regarding the results in 2 weeks, please contact this office.        Return in about 3 months (around 01/08/2016).   HOPPER,DAVID, MD 10/08/2015

## 2015-10-08 NOTE — Progress Notes (Signed)
Patient ID: [name] Love, male    DOB: 08-15-1955  Age: 60 y.o. MRN: [id number]  Chief Complaint  Patient presents with  . Annual Exam  . Cough    Subjective:   Physical exam:  History: Patient is here for his regular physical exam. No other major issues over the past year. 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 He has not seen his cardiologist for over a year. He has not been having any problems. He needs his medicines refilled.  Past medical history:  Medication allergies: None  Current medications:losartan, metformin, glipizide, fenofibrate,lipitor and aspirin  past medical history:  Diabetes  Coronary artery bypass  Surgery:  Coronary bypass as noted above.Since last visit head bilateral cataract surgery and right shoulder surgery.  Family history:  Mother is living, recent hip fracture, otherwise doing okay .  High cholesterol and heart disease. Father is deceased  No major familial diseases except for mother's side of family has a lot of diabetes   Social history:  Patient is married, 32 years. That is his only sexual partner. He does not smoke or use any drugs. 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 .  He gets a lot of exercise recently and just has put together, hopes to be using it soon.  Review of systems:  Constitutional: Unremarkable  HEENT: Unremarkable  Respiratory: Unremarkable  Cardiovascular: Unremarkable  Gastrointestinal: Intermittent diarrhea which she is done for a long time. He is able to handle it by taking an Imodium if needed, not very often. Genitourinary: Unremarkable  Musculoskeletal: Joint aches and pains. His back pain is what limits some of his activities. Right shoulder still hurts having had surgery on it. Dermatologic: Unremarkable Neurologic: Unremarkable  Hematologic: Unremarkable  Psychiatric: Unremarkable     Assessment:  Physical examination  Diabetes  Hypertension  Hyperlipidemia  History of coronary disease and CABG  Arthralgias  Diarrhea   Current allergies, medications, problem list, past/family and social histories reviewed.  Objective:  BP 122/68 mmHg  Pulse 90  Temp(Src) 98.6 F (37 C) (Oral)  Resp 16  Ht 5' 10.5" (1.791 m)  Wt 235 lb (106.595 kg)  BMI 33.23 kg/m2  SpO2 98%  Physical examination: Well-developed well-nourished man in no major distress. Moderately overweight. TMs are normal. Eyes PERRLA. Fundi benign. Throat clear. Neck supple without nodes thyromegaly. No carotid bruits. Chest clear to auscultation. Heart regular without murmurs gallops or arrhythmias. And soft without mass or tenderness. Normal male external genitalia with no hernias. Digital rectal exam was done and his prostate gland is normal. Extremities are unremarkable. Skin unremarkable.   Assessment & Plan:   Assessment: 1. Annual physical exam   2. Coronary artery disease due to lipid rich plaque   3. Uncontrolled type 2 diabetes mellitus with complication, without long-term current use of insulin ([place])   4. Essential hypertension   5. History of coronary artery bypass surgery   6. Hyperlipidemia   7. OBESITY   8. Cough       Plan: Patient looks healthy. Will send his prescriptions in. He has a new IT consultant.  Orders Placed This Encounter  Procedures  . DG Chest 2 View    Order Specific Question:  Reason for Exam (SYMPTOM  OR DIAGNOSIS REQUIRED)    Answer:  persisting cough    Order Specific Question:  Preferred imaging location?    Answer:  External  . Comprehensive  metabolic panel  . Microalbumin, urine  . Lipid panel  . POCT CBC  . POC Hemoccult Bld/Stl (1-Cd Office Dx)  . POCT glucose (manual entry)  . POCT glycosylated hemoglobin (Hb A1C)  . POCT Microscopic Urinalysis (UMFC)  . POCT urinalysis dipstick    No orders of the defined types were placed in  this encounter.         Patient Instructions       IF you received an x-ray today, you will receive an invoice from [facility]. Please contact [facility] at [phone] with questions or concerns regarding your invoice.   IF you received labwork today, you will receive an invoice from [employer]. Please contact [facility] at [phone] with questions or concerns regarding your invoice.   Our billing staff will not be able to assist you with questions regarding bills from these companies.  You will be contacted with the lab results as soon as they are available. The fastest way to get your results is to activate your My Chart account. Instructions are located on the last page of this paperwork. If you have not heard from [place] regarding the results in 2 weeks, please contact this office.       Results for orders placed or performed in visit on 10/08/15  POCT CBC  Result Value Ref Range   WBC 3.3 (A) 4.6 - 10.2 K/uL   Lymph, poc 0.6 0.6 - 3.4   POC LYMPH PERCENT 19.2 10 - 50 %L   MID (cbc) 0.3 0 - 0.9   POC MID % 9.5 0 - 12 %M   POC Granulocyte 2.4 2 - 6.9   Granulocyte percent 71.3 37 - 80 %G   RBC 4.90 4.69 - 6.13 M/uL   Hemoglobin 13.9 (A) 14.1 - 18.1 g/dL   HCT, POC 39.5 (A) 43.5 - 53.7 %   MCV 80.7 80 - 97 fL   MCH, POC 28.5 27 - 31.2 pg   MCHC 35.3 31.8 - 35.4 g/dL   RDW, POC 14.5 %   Platelet Count, POC 144 142 - 424 K/uL   MPV 8.5 0 - 99.8 fL  POC Hemoccult Bld/Stl (1-Cd Office Dx)  Result Value Ref Range   Card #1 Date 10/08/2015    Fecal Occult Blood, POC Negative Negative  POCT glucose (manual entry)  Result Value Ref Range   POC Glucose 229 (A) 70 - 99 mg/dl  POCT glycosylated hemoglobin (Hb A1C)  Result Value Ref Range   Hemoglobin A1C 9.3   POCT Microscopic Urinalysis ([facility]FC)  Result Value Ref Range   WBC,UR,HPF,POC Few (A) None WBC/hpf   RBC,UR,HPF,POC None None RBC/hpf   Bacteria None None, Too  numerous to count   Mucus Absent Absent   Epithelial Cells, UR Per Microscopy Few (A) None, Too numerous to count cells/hpf  POCT urinalysis dipstick  Result Value Ref Range   Color, UA yellow yellow   Clarity, UA clear clear   Glucose, UA =500 (A) negative   Bilirubin, UA negative negative   Ketones, POC UA negative negative   Spec Grav, UA >=1.030    Blood, UA negative negative   pH, UA 5.5    Protein Ur, POC =100 (A) negative   Urobilinogen, UA 0.2    Nitrite, UA Negative Negative   Leukocytes, UA Negative Negative      No Follow-up on file.   Lya Holben, MD 10/08/2015

## 2015-10-08 NOTE — Patient Instructions (Addendum)
Continue current meds  Advise following up on your diabetes in about 3 months. Because of the new medicine I think it is important to get rechecked then  Get regular exercise  Work on weight loss  Take doxycycline 1 twice daily for infection  Take the Hycodan cough syrup 1 teaspoon every 4-6 hours as needed for nighttime cough or when you can afford to be sedated  Take the benzonatate cough pills one or 2 pills 3 times daily for daytime cough  Use the albuterol inhaler 2 inhalations every 4-6 hours if wheezing  Take the terbinafine 250 mg 1 pill daily for one month, then get blood drawn. If your liver enzymes are good I can call in for the additional 2 months. You should be on this for 3 months, and it can take up to 6 months to a year for the nails to grow out completely normally.   Take invokana 300 mg 1 each morning for diabetes   IF you received an x-ray today, you will receive an invoice from [facility]. Please contact [facility] at [phone] with questions or concerns regarding your invoice.   IF you received labwork today, you will receive an invoice from [employer]. Please contact [facility] at [phone] with questions or concerns regarding your invoice.   Our billing staff will not be able to assist you with questions regarding bills from these companies.  You will be contacted with the lab results as soon as they are available. The fastest way to get your results is to activate your My Chart account. Instructions are located on the last page of this paperwork. If you have not heard from [place] regarding the results in 2 weeks, please contact this office.

## 2015-10-10 ENCOUNTER — Encounter: Payer: Self-pay | Admitting: Family Medicine

## 2015-10-12 ENCOUNTER — Other Ambulatory Visit: Payer: Self-pay | Admitting: Family Medicine

## 2015-10-13 ENCOUNTER — Encounter: Payer: Self-pay | Admitting: *Deleted

## 2016-01-04 ENCOUNTER — Telehealth: Payer: Self-pay

## 2016-01-04 DIAGNOSIS — E1165 Type 2 diabetes mellitus with hyperglycemia: Secondary | ICD-10-CM

## 2016-01-04 DIAGNOSIS — E118 Type 2 diabetes mellitus with unspecified complications: Principal | ICD-10-CM

## 2016-01-04 DIAGNOSIS — IMO0002 Reserved for concepts with insufficient information to code with codable children: Secondary | ICD-10-CM

## 2016-01-04 NOTE — Telephone Encounter (Signed)
Patient stated he need someone to call [employer]'s mail order pharmacy and tell the customer service person that Dr. Linna Darner is prescribing [place]. Tell them that he is on a discount program through a pharmaceutical program that only allows thirty day prescriptions. Need to waive the mail order requirements so they won't reject the medication. [phone].

## 2016-01-05 NOTE — Telephone Encounter (Signed)
I need to call [employer] to request a 30 day supply for his discount program.

## 2016-01-07 MED ORDER — CANAGLIFLOZIN 300 MG PO TABS: 300.0000 mg | ORAL_TABLET | Freq: Every day | ORAL | Status: DC

## 2016-01-07 NOTE — Telephone Encounter (Signed)
Sent in 30 days of Invokana for discount program.

## 2016-01-17 ENCOUNTER — Telehealth: Payer: Self-pay

## 2016-01-17 NOTE — Telephone Encounter (Signed)
Patient is calling because we are supposed to be calling [employer] mail order pharmacy to set up a pharmacy exception for invokana since it is a maintenance drug. He needs approval to pick up the medication at a regular pharmacy for 30 days because he is on a discount program. Patient states this was done wrong last time and we cannot send the prescription to [facility]. We only need to call to for the approval. Patient phone: [phone] [facility] pharmacy: [phone]

## 2016-01-19 ENCOUNTER — Telehealth: Payer: Self-pay

## 2016-01-19 NOTE — Telephone Encounter (Signed)
Pt called requesting repeat refill for Invokana 300mg  tabs:  His insurance plan requires mail order delivery.  His cost for [place] Delivery is over $180.00 He is on a discount program that costs him $ 0.00 with [facility].  IC Christella Scheuermann [phone] - spoke with DJ who placed the Prior Authorization request for waiver of mail order delivery.  Response will be within 72 hours. Reference # [id number]  IC pt and gave him all the above information

## 2016-01-24 ENCOUNTER — Telehealth: Payer: Self-pay

## 2016-01-24 NOTE — Telephone Encounter (Signed)
Pt is returning Rose's call about medicaitons   Best number [phone]

## 2016-01-31 NOTE — Telephone Encounter (Signed)
See notes under 6/28 ph message.

## 2016-01-31 NOTE — Telephone Encounter (Signed)
Called pt and advised of denial. Suggested he call cust service and see if they can tell him if there are any meds in this class on a lower tier, or get a list of other formulary oral DM meds that would be less expensive for him and call me back with this info, if so.

## 2016-01-31 NOTE — Telephone Encounter (Signed)
The request for filling Invokana locally was denied stating that ins plan requires mail order and [name] Love finds "no compelling medical reason to grant an exemptions to the mandatory mail order program for this medication".

## 2016-06-02 ENCOUNTER — Other Ambulatory Visit: Payer: Self-pay | Admitting: Family Medicine

## 2016-06-02 DIAGNOSIS — E118 Type 2 diabetes mellitus with unspecified complications: Principal | ICD-10-CM

## 2016-06-02 DIAGNOSIS — IMO0002 Reserved for concepts with insufficient information to code with codable children: Secondary | ICD-10-CM

## 2016-06-02 DIAGNOSIS — E1165 Type 2 diabetes mellitus with hyperglycemia: Secondary | ICD-10-CM

## 2016-07-27 ENCOUNTER — Ambulatory Visit (INDEPENDENT_AMBULATORY_CARE_PROVIDER_SITE_OTHER): Payer: Managed Care, Other (non HMO) | Admitting: Emergency Medicine

## 2016-07-27 VITALS — BP 132/80 | HR 84 | Temp 97.7°F | Resp 18 | Ht 70.5 in | Wt 234.0 lb

## 2016-07-27 DIAGNOSIS — Z23 Encounter for immunization: Secondary | ICD-10-CM | POA: Diagnosis not present

## 2016-07-27 DIAGNOSIS — Z0001 Encounter for general adult medical examination with abnormal findings: Secondary | ICD-10-CM

## 2016-07-27 DIAGNOSIS — IMO0002 Reserved for concepts with insufficient information to code with codable children: Secondary | ICD-10-CM

## 2016-07-27 DIAGNOSIS — R05 Cough: Secondary | ICD-10-CM

## 2016-07-27 DIAGNOSIS — E785 Hyperlipidemia, unspecified: Secondary | ICD-10-CM | POA: Diagnosis not present

## 2016-07-27 DIAGNOSIS — E118 Type 2 diabetes mellitus with unspecified complications: Secondary | ICD-10-CM | POA: Diagnosis not present

## 2016-07-27 DIAGNOSIS — Z0000 Encounter for general adult medical examination without abnormal findings: Secondary | ICD-10-CM

## 2016-07-27 DIAGNOSIS — I10 Essential (primary) hypertension: Secondary | ICD-10-CM

## 2016-07-27 DIAGNOSIS — E1165 Type 2 diabetes mellitus with hyperglycemia: Secondary | ICD-10-CM | POA: Diagnosis not present

## 2016-07-27 DIAGNOSIS — R059 Cough, unspecified: Secondary | ICD-10-CM

## 2016-07-27 LAB — GLUCOSE, POCT (MANUAL RESULT ENTRY): POC Glucose: 194 mg/dl — AB (ref 70–99)

## 2016-07-27 LAB — POCT GLYCOSYLATED HEMOGLOBIN (HGB A1C): Hemoglobin A1C: 8.8

## 2016-07-27 MED ORDER — LOSARTAN POTASSIUM 50 MG PO TABS: 50.0000 mg | ORAL_TABLET | Freq: Every day | ORAL | 3 refills | Status: DC

## 2016-07-27 MED ORDER — SIMVASTATIN 40 MG PO TABS: ORAL_TABLET | ORAL | 12 refills | Status: DC

## 2016-07-27 MED ORDER — DAPAGLIFLOZIN PROPANEDIOL 5 MG PO TABS: 5.0000 mg | ORAL_TABLET | Freq: Every day | ORAL | 6 refills | Status: DC

## 2016-07-27 MED ORDER — FENOFIBRATE 160 MG PO TABS
160.0000 mg | ORAL_TABLET | Freq: Every day | ORAL | 3 refills | Status: DC
Start: 2016-07-27 — End: 2017-04-02

## 2016-07-27 MED ORDER — METFORMIN HCL 1000 MG PO TABS: 1000.0000 mg | ORAL_TABLET | Freq: Two times a day (BID) | ORAL | 6 refills | Status: DC

## 2016-07-27 MED ORDER — GLIPIZIDE ER 5 MG PO TB24: 5.0000 mg | ORAL_TABLET | Freq: Every day | ORAL | 12 refills | Status: DC

## 2016-07-27 MED ORDER — METFORMIN HCL 1000 MG PO TABS: 1000.0000 mg | ORAL_TABLET | Freq: Two times a day (BID) | ORAL | 3 refills | Status: DC

## 2016-07-27 MED ORDER — DAPAGLIFLOZIN PROPANEDIOL 5 MG PO TABS: 5.0000 mg | ORAL_TABLET | Freq: Every day | ORAL | 3 refills | Status: DC

## 2016-07-27 NOTE — Patient Instructions (Addendum)
   IF you received an x-ray today, you will receive an invoice from [facility]. Please contact [facility] at [phone] with questions or concerns regarding your invoice.   IF you received labwork today, you will receive an invoice from [facility]. Please contact [facility] at [phone] with questions or concerns regarding your invoice.   Our billing staff will not be able to assist you with questions regarding bills from these companies.  You will be contacted with the lab results as soon as they are available. The fastest way to get your results is to activate your My Chart account. Instructions are located on the last page of this paperwork. If you have not heard from us regarding the results in 2 weeks, please contact this office.     Diabetes Mellitus and Food It is important for you to manage your blood sugar (glucose) level. Your blood glucose level can be greatly affected by what you eat. Eating healthier foods in the appropriate amounts throughout the day at about the same time each day will help you control your blood glucose level. It can also help slow or prevent worsening of your diabetes mellitus. Healthy eating may even help you improve the level of your blood pressure and reach or maintain a healthy weight. General recommendations for healthful eating and cooking habits include:  Eating meals and snacks regularly. Avoid going long periods of time without eating to lose weight.  Eating a diet that consists mainly of plant-based foods, such as fruits, vegetables, nuts, legumes, and whole grains.  Using low-heat cooking methods, such as baking, instead of high-heat cooking methods, such as deep frying.  Work with your dietitian to make sure you understand how to use the Nutrition Facts information on food labels. How can food affect me? Carbohydrates Carbohydrates affect your blood glucose level more than any other type of food. Your dietitian will help  you determine how many carbohydrates to eat at each meal and teach you how to count carbohydrates. Counting carbohydrates is important to keep your blood glucose at a healthy level, especially if you are using insulin or taking certain medicines for diabetes mellitus. Alcohol Alcohol can cause sudden decreases in blood glucose (hypoglycemia), especially if you use insulin or take certain medicines for diabetes mellitus. Hypoglycemia can be a life-threatening condition. Symptoms of hypoglycemia (sleepiness, dizziness, and disorientation) are similar to symptoms of having too much alcohol. If your health care provider has given you approval to drink alcohol, do so in moderation and use the following guidelines:  Women should not have more than one drink per day, and men should not have more than two drinks per day. One drink is equal to: ? 12 oz of beer. ? 5 oz of wine. ? 1 oz of hard liquor.  Do not drink on an empty stomach.  Keep yourself hydrated. Have water, diet soda, or unsweetened iced tea.  Regular soda, juice, and other mixers might contain a lot of carbohydrates and should be counted.  What foods are not recommended? As you make food choices, it is important to remember that all foods are not the same. Some foods have fewer nutrients per serving than other foods, even though they might have the same number of calories or carbohydrates. It is difficult to get your body what it needs when you eat foods with fewer nutrients. Examples of foods that you should avoid that are high in calories and carbohydrates but low in nutrients include:  Trans fats (most processed   list trans fats on the Nutrition Facts label).  Regular soda.  Juice.  Candy.  Sweets, such as cake, pie, doughnuts, and cookies.  Fried foods. What foods can I eat? Eat nutrient-rich foods, which will nourish your body and keep you healthy. The food you should eat also will depend on several factors,  including:  The calories you need.  The medicines you take.  Your weight.  Your blood glucose level.  Your blood pressure level.  Your cholesterol level. You should eat a variety of foods, including:  Protein.  Lean cuts of meat.  Proteins low in saturated fats, such as fish, egg whites, and beans. Avoid processed meats.  Fruits and vegetables.  Fruits and vegetables that may help control blood glucose levels, such as apples, mangoes, and yams.  Dairy products.  Choose fat-free or low-fat dairy products, such as milk, yogurt, and cheese.  Grains, bread, pasta, and rice.  Choose whole grain products, such as multigrain bread, whole oats, and brown rice. These foods may help control blood pressure.  Fats.  Foods containing healthful fats, such as nuts, avocado, olive oil, canola oil, and fish. Does everyone with diabetes mellitus have the same meal plan? Because every person with diabetes mellitus is different, there is not one meal plan that works for everyone. It is very important that you meet with a dietitian who will help you create a meal plan that is just right for you. This information is not intended to replace advice given to you by your health care provider. Make sure you discuss any questions you have with your health care provider. Document Released: 04/06/2005 Document Revised: 12/16/2015 Document Reviewed: 06/06/2013 Elsevier Interactive Patient Education  2017 [place] Maintenance, Male A healthy lifestyle and preventative care can promote health and wellness.  Maintain regular health, dental, and eye exams.  Eat a healthy diet. Foods like vegetables, fruits, whole grains, low-fat dairy products, and lean protein foods contain the nutrients you need and are low in calories. Decrease your intake of foods high in solid fats, added sugars, and salt. Get information about a proper diet from your health care provider, if necessary.  Regular  physical exercise is one of the most important things you can do for your health. Most adults should get at least 150 minutes of moderate-intensity exercise (any activity that increases your heart rate and causes you to sweat) each week. In addition, most adults need muscle-strengthening exercises on 2 or more days a week.   Maintain a healthy weight. The body mass index (BMI) is a screening tool to identify possible weight problems. It provides an estimate of body fat based on height and weight. Your health care provider can find your BMI and can help you achieve or maintain a healthy weight. For males 20 years and older:  A BMI below 18.5 is considered underweight.  A BMI of 18.5 to 24.9 is normal.  A BMI of 25 to 29.9 is considered overweight.  A BMI of 30 and above is considered obese.  Maintain normal blood lipids and cholesterol by exercising and minimizing your intake of saturated fat. Eat a balanced diet with plenty of fruits and vegetables. Blood tests for lipids and cholesterol should begin at age 23 and be repeated every 5 years. If your lipid or cholesterol levels are high, you are over age 61, or you are at high risk for heart disease, you may need your cholesterol levels checked more frequently.Ongoing high lipid and cholesterol  levels should be treated with medicines if diet and exercise are not working.  If you smoke, find out from your health care provider how to quit. If you do not use tobacco, do not start.  Lung cancer screening is recommended for adults aged 64-80 years who are at high risk for developing lung cancer because of a history of smoking. A yearly low-dose CT scan of the lungs is recommended for people who have at least a 30-pack-year history of smoking and are current smokers or have quit within the past 15 years. A pack year of smoking is smoking an average of 1 pack of cigarettes a day for 1 year (for example, a 30-pack-year history of smoking could mean smoking 1  pack a day for 30 years or 2 packs a day for 15 years). Yearly screening should continue until the smoker has stopped smoking for at least 15 years. Yearly screening should be stopped for people who develop a health problem that would prevent them from having lung cancer treatment.  If you choose to drink alcohol, do not have more than 2 drinks per day. One drink is considered to be 12 oz (360 mL) of beer, 5 oz (150 mL) of wine, or 1.5 oz (45 mL) of liquor.  Avoid the use of street drugs. Do not share needles with anyone. Ask for help if you need support or instructions about stopping the use of drugs.  High blood pressure causes heart disease and increases the risk of stroke. High blood pressure is more likely to develop in:  People who have blood pressure in the end of the normal range (100-139/85-89 mm Hg).  People who are overweight or obese.  People who are African American.  If you are 95-24 years of age, have your blood pressure checked every 3-5 years. If you are 5 years of age or older, have your blood pressure checked every year. You should have your blood pressure measured twice-once when you are at a hospital or clinic, and once when you are not at a hospital or clinic. Record the average of the two measurements. To check your blood pressure when you are not at a hospital or clinic, you can use:  An automated blood pressure machine at a pharmacy.  A home blood pressure monitor.  If you are 65-91 years old, ask your health care provider if you should take aspirin to prevent heart disease.  Diabetes screening involves taking a blood sample to check your fasting blood sugar level. This should be done once every 3 years after age 35 if you are at a normal weight and without risk factors for diabetes. Testing should be considered at a younger age or be carried out more frequently if you are overweight and have at least 1 risk factor for diabetes.  Colorectal cancer can be detected and  often prevented. Most routine colorectal cancer screening begins at the age of 70 and continues through age 72. However, your health care provider may recommend screening at an earlier age if you have risk factors for colon cancer. On a yearly basis, your health care provider may provide home test kits to check for hidden blood in the stool. A small camera at the end of a tube may be used to directly examine the colon (sigmoidoscopy or colonoscopy) to detect the earliest forms of colorectal cancer. Talk to your health care provider about this at age 72 when routine screening begins. A direct exam of the colon should be repeated  every 5-10 years through age 86, unless early forms of precancerous polyps or small growths are found.  People who are at an increased risk for hepatitis B should be screened for this virus. You are considered at high risk for hepatitis B if:  You were born in a country where hepatitis B occurs often. Talk with your health care provider about which countries are considered high risk.  Your parents were born in a high-risk country and you have not received a shot to protect against hepatitis B (hepatitis B vaccine).  You have HIV or AIDS.  You use needles to inject street drugs.  You live with, or have sex with, someone who has hepatitis B.  You are a man who has sex with other men (MSM).  You get hemodialysis treatment.  You take certain medicines for conditions like cancer, organ transplantation, and autoimmune conditions.  Hepatitis C blood testing is recommended for all people born from [birth year] through 1965 and any individual with known risk factors for hepatitis C.  Healthy men should no longer receive prostate-specific antigen (PSA) blood tests as part of routine cancer screening. Talk to your health care provider about prostate cancer screening.  Testicular cancer screening is not recommended for adolescents or adult males who have no symptoms. Screening includes  self-exam, a health care provider exam, and other screening tests. Consult with your health care provider about any symptoms you have or any concerns you have about testicular cancer.  Practice safe sex. Use condoms and avoid high-risk sexual practices to reduce the spread of sexually transmitted infections (STIs).  You should be screened for STIs, including gonorrhea and chlamydia if:  You are sexually active and are younger than 24 years.  You are older than 24 years, and your health care provider tells you that you are at risk for this type of infection.  Your sexual activity has changed since you were last screened, and you are at an increased risk for chlamydia or gonorrhea. Ask your health care provider if you are at risk.  If you are at risk of being infected with HIV, it is recommended that you take a prescription medicine daily to prevent HIV infection. This is called pre-exposure prophylaxis (PrEP). You are considered at risk if:  You are a man who has sex with other men (MSM).  You are a heterosexual man who is sexually active with multiple partners.  You take drugs by injection.  You are sexually active with a partner who has HIV.  Talk with your health care provider about whether you are at high risk of being infected with HIV. If you choose to begin PrEP, you should first be tested for HIV. You should then be tested every 3 months for as long as you are taking PrEP.  Use sunscreen. Apply sunscreen liberally and repeatedly throughout the day. You should seek shade when your shadow is shorter than you. Protect yourself by wearing long sleeves, pants, a wide-brimmed hat, and sunglasses year round whenever you are outdoors.  Tell your health care provider of new moles or changes in moles, especially if there is a change in shape or color. Also, tell your health care provider if a mole is larger than the size of a pencil eraser.  A one-time screening for abdominal aortic aneurysm  (AAA) and surgical repair of large AAAs by ultrasound is recommended for men aged 39-75 years who are current or former smokers.  Stay current with your vaccines (immunizations). This information is  not intended to replace advice given to you by your health care provider. Make sure you discuss any questions you have with your health care provider. Document Released: 01/06/2008 Document Revised: 07/31/2014 Document Reviewed: 04/13/2015 Elsevier Interactive Patient Education  2017 [facility]) Exercise Recommendation  Being physically active is important to prevent heart disease and stroke, the nation's No. 1and No. 5killers. To improve overall cardiovascular health, we suggest at least 150 minutes per week of moderate exercise or 75 minutes per week of vigorous exercise (or a combination of moderate and vigorous activity). Thirty minutes a day, five times a week is an easy goal to remember. You will also experience benefits even if you divide your time into two or three segments of 10 to 15 minutes per day.  For people who would benefit from lowering their blood pressure or cholesterol, we recommend 40 minutes of aerobic exercise of moderate to vigorous intensity three to four times a week to lower the risk for heart attack and stroke.  Physical activity is anything that makes you move your body and burn calories.  This includes things like climbing stairs or playing sports. Aerobic exercises benefit your heart, and include walking, jogging, swimming or biking. Strength and stretching exercises are best for overall stamina and flexibility.  The simplest, positive change you can make to effectively improve your heart health is to start walking. It's enjoyable, free, easy, social and great exercise. A walking program is flexible and boasts high success rates because people can stick with it. It's easy for walking to become a regular and satisfying part of life.    For Overall Cardiovascular Health:  At least 30 minutes of moderate-intensity aerobic activity at least 5 days per week for a total of 150  OR   At least 25 minutes of vigorous aerobic activity at least 3 days per week for a total of 75 minutes; or a combination of moderate- and vigorous-intensity aerobic activity  AND   Moderate- to high-intensity muscle-strengthening activity at least 2 days per week for additional health benefits.  For Lowering Blood Pressure and Cholesterol  An average 40 minutes of moderate- to vigorous-intensity aerobic activity 3 or 4 times per week  What if I can't make it to the time goal? Something is always better than nothing! And everyone has to start somewhere. Even if you've been sedentary for years, today is the day you can begin to make healthy changes in your life. If you don't think you'll make it for 30 or 40 minutes, set a reachable goal for today. You can work up toward your overall goal by increasing your time as you get stronger. Don't let all-or-nothing thinking rob you of doing what you can every day.  Source:http://www.heart.org

## 2016-07-27 NOTE — Progress Notes (Signed)
[name] Love 61 y.o.   Chief Complaint  Patient presents with  . Annual Exam    bloodwork  . Flu Vaccine    HISTORY OF PRESENT ILLNESS: This is a 61 y.o. male here for annual exam; aslso c/o dry cough x 3 weeks, getting slowly better. No other complaints. Plan not covering [place] but it will cover Farxiga instead.  HPI   Prior to Admission medications   Medication Sig Start Date End Date Taking? Authorizing Provider  aspirin EC 81 MG tablet Take 1 tablet (81 mg total) by mouth daily. 02/04/15  Yes [name], MD  fenofibrate 160 MG tablet Take 1 tablet (160 mg total) by mouth daily. 07/27/16  Yes [facility], MD  glipiZIDE (GLIPIZIDE XL) 5 MG 24 hr tablet Take 1 tablet (5 mg total) by mouth daily. 07/27/16  Yes Verble Styron Joellen Jersey, MD  losartan (COZAAR) 50 MG tablet Take 1 tablet (50 mg total) by mouth daily. 07/27/16  Yes Cameron, MD  metFORMIN (GLUCOPHAGE) 1000 MG tablet Take 1 tablet (1,000 mg total) by mouth 2 (two) times daily with a meal. 07/27/16  Yes [name], MD  simvastatin (ZOCOR) 40 MG tablet Take one daily after evening meal for cholesterol 07/27/16  Yes [name], MD  albuterol (PROVENTIL HFA;VENTOLIN HFA) 108 (90 Base) MCG/ACT inhaler Inhale 2 puffs into the lungs every 4 (four) hours as needed for wheezing (cough, shortness of breath or wheezing.). Patient not taking: Reported on 07/27/2016 10/08/15   [name], MD  chlorpheniramine-HYDROcodone [facility]) 10-8 MG/5ML SUER Take 5 mLs by mouth every 12 (twelve) hours as needed for cough. Patient not taking: Reported on 07/27/2016 09/20/15   Ezekiel Slocumb, PA-C  dapagliflozin propanediol (FARXIGA) 5 MG TABS tablet Take 5 mg by mouth daily. 07/27/16   [place], MD  INVOKANA 300 MG TABS tablet TAKE 1 TABLET BY MOUTH DAILY BEFORE BREAKFAST Patient not taking: Reported on 07/27/2016 06/05/16   Jaynee Eagles, PA-C  Omega-3 Fatty Acids (FISH OIL PO)  Take 2,000 mg by mouth 2 (two) times daily.      Historical Provider, MD  PROLENSA 0.07 % SOLN Place 1 drop into the left eye daily. Reported on 10/08/2015 12/18/14   Historical Provider, MD  tadalafil (CIALIS) 5 MG tablet Take 1 tablet (5 mg total) by mouth daily as needed for erectile dysfunction. Patient not taking: Reported on 07/27/2016 09/24/14   [name], MD    No Known Allergies  Patient Active Problem List   Diagnosis Date Noted  . Erectile dysfunction 09/15/2013  . Nonspecific abnormal electrocardiogram (ECG) (EKG) 06/10/2013  . Type II or unspecified type diabetes mellitus without mention of complication, not stated as uncontrolled 09/12/2012  . Coronary artery disease 07/14/2011  . History of coronary artery bypass surgery 07/14/2011  . DM (diabetes mellitus), type 2, uncontrolled with complications ([place]) [id number]  . Hyperlipidemia 02/23/2009  . OBESITY 02/23/2009  . Essential hypertension 02/23/2009    Past Medical History:  Diagnosis Date  . Coronary artery disease    post CABG x3 in 2007  . Diabetes mellitus   . Hyperlipidemia   . Hypertension   . Obesity     Past Surgical History:  Procedure Laterality Date  . CARDIAC CATHETERIZATION  EF= 45-50%   EF= 45-50% -- Three-vessel coronary artery disease with high-grade lesions in the proximal left anterior descending artery and multiple lesions in the right coronary -- Low normal ejection fraction --  Stable exertional angina -- [name], M.D. [facility]  . CORONARY ARTERY BYPASS GRAFT  02/19/2006     x3 (left internal mammary artery to LAD, left radial artery to posterior descending, saphenous vein graft to first diagonal), endoscopic vein harvest right leg -- SURGEON:  [name]. [name], M.D.  . HERNIA REPAIR     at 61 y/o  . tripple bypass      Social History   Social History  . Marital status: Married    Spouse name: N/A  . Number of children: N/A  . Years of education: N/A   Occupational History   . Not on file.   Social History Main Topics  . Smoking status: Never Smoker  . Smokeless tobacco: Never Used  . Alcohol use 0.6 oz/week    1 Standard drinks or equivalent per week  . Drug use: No  . Sexual activity: Yes   Other Topics Concern  . Not on file   Social History Narrative  . No narrative on file    Family History  Problem Relation Age of Onset  . Heart disease Mother   . Cancer Father      ROS   Physical Exam   ASSESSMENT & PLAN: DM treatment discussed. Labs reviewed with patient. Will add Farxiga to present regimen. [name] Love was seen today for annual exam and flu vaccine.  Diagnoses and all orders for this visit:  Uncontrolled type 2 diabetes mellitus with complication, without long-term current use of insulin (HCC) -     POCT glucose (manual entry) -     POCT glycosylated hemoglobin (Hb A1C) -     Comprehensive metabolic panel -     CBC with Differential/Platelet -     Lipid panel -     Discontinue: metFORMIN (GLUCOPHAGE) 1000 MG tablet; Take 1 tablet (1,000 mg total) by mouth 2 (two) times daily with a meal. -     glipiZIDE (GLIPIZIDE XL) 5 MG 24 hr tablet; Take 1 tablet (5 mg total) by mouth daily. -     metFORMIN (GLUCOPHAGE) 1000 MG tablet; Take 1 tablet (1,000 mg total) by mouth 2 (two) times daily with a meal. -     dapagliflozin propanediol (FARXIGA) 5 MG TABS tablet; Take 5 mg by mouth daily.  Need for prophylactic vaccination and inoculation against influenza -     Flu Vaccine QUAD 36+ mos IM  Annual physical exam  Essential hypertension -     Discontinue: losartan (COZAAR) 50 MG tablet; Take 1 tablet (50 mg total) by mouth daily. -     losartan (COZAAR) 50 MG tablet; Take 1 tablet (50 mg total) by mouth daily.  Hyperlipidemia, unspecified hyperlipidemia type -     simvastatin (ZOCOR) 40 MG tablet; Take one daily after evening meal for cholesterol -     fenofibrate 160 MG tablet; Take 1 tablet (160 mg total) by mouth  daily.  Cough  Other orders -     Discontinue: dapagliflozin propanediol (FARXIGA) 5 MG TABS tablet; Take 5 mg by mouth daily.   Patient Instructions       IF you received an x-ray today, you will receive an invoice from [facility]. Please contact [facility] at [phone] with questions or concerns regarding your invoice.   IF you received labwork today, you will receive an invoice from [place]. Please contact [facility] at [phone] with questions or concerns regarding your invoice.   Our billing staff will not be able to assist you with questions regarding  bills from these companies.  You will be contacted with the lab results as soon as they are available. The fastest way to get your results is to activate your My Chart account. Instructions are located on the last page of this paperwork. If you have not heard from [place] regarding the results in 2 weeks, please contact this office.    Diabetes Mellitus and Food It is important for you to manage your blood sugar (glucose) level. Your blood glucose level can be greatly affected by what you eat. Eating healthier foods in the appropriate amounts throughout the day at about the same time each day will help you control your blood glucose level. It can also help slow or prevent worsening of your diabetes mellitus. Healthy eating may even help you improve the level of your blood pressure and reach or maintain a healthy weight. General recommendations for healthful eating and cooking habits include:  Eating meals and snacks regularly. Avoid going long periods of time without eating to lose weight.  Eating a diet that consists mainly of plant-based foods, such as fruits, vegetables, nuts, legumes, and whole grains.  Using low-heat cooking methods, such as baking, instead of high-heat cooking methods, such as deep frying. Work with your dietitian to make sure you understand how to use the Nutrition Facts information on  food labels. How can food affect me? Carbohydrates  Carbohydrates affect your blood glucose level more than any other type of food. Your dietitian will help you determine how many carbohydrates to eat at each meal and teach you how to count carbohydrates. Counting carbohydrates is important to keep your blood glucose at a healthy level, especially if you are using insulin or taking certain medicines for diabetes mellitus. Alcohol  Alcohol can cause sudden decreases in blood glucose (hypoglycemia), especially if you use insulin or take certain medicines for diabetes mellitus. Hypoglycemia can be a life-threatening condition. Symptoms of hypoglycemia (sleepiness, dizziness, and disorientation) are similar to symptoms of having too much alcohol. If your health care provider has given you approval to drink alcohol, do so in moderation and use the following guidelines:  Women should not have more than one drink per day, and men should not have more than two drinks per day. One drink is equal to:  12 oz of beer.  5 oz of wine.  1 oz of hard liquor.  Do not drink on an empty stomach.  Keep yourself hydrated. Have water, diet soda, or unsweetened iced tea.  Regular soda, juice, and other mixers might contain a lot of carbohydrates and should be counted. What foods are not recommended? As you make food choices, it is important to remember that all foods are not the same. Some foods have fewer nutrients per serving than other foods, even though they might have the same number of calories or carbohydrates. It is difficult to get your body what it needs when you eat foods with fewer nutrients. Examples of foods that you should avoid that are high in calories and carbohydrates but low in nutrients include:  Trans fats (most processed foods list trans fats on the Nutrition Facts label).  Regular soda.  Juice.  Candy.  Sweets, such as cake, pie, doughnuts, and cookies.  Fried foods. What foods  can I eat? Eat nutrient-rich foods, which will nourish your body and keep you healthy. The food you should eat also will depend on several factors, including:  The calories you need.  The medicines you take.  Your weight.  Your blood glucose level.  Your blood pressure level.  Your cholesterol level. You should eat a variety of foods, including:  Protein.  Lean cuts of meat.  Proteins low in saturated fats, such as fish, egg whites, and beans. Avoid processed meats.  Fruits and vegetables.  Fruits and vegetables that may help control blood glucose levels, such as apples, mangoes, and yams.  Dairy products.  Choose fat-free or low-fat dairy products, such as milk, yogurt, and cheese.  Grains, bread, pasta, and rice.  Choose whole grain products, such as multigrain bread, whole oats, and brown rice. These foods may help control blood pressure.  Fats.  Foods containing healthful fats, such as nuts, avocado, olive oil, canola oil, and fish. Does everyone with diabetes mellitus have the same meal plan? Because every person with diabetes mellitus is different, there is not one meal plan that works for everyone. It is very important that you meet with a dietitian who will help you create a meal plan that is just right for you. This information is not intended to replace advice given to you by your health care provider. Make sure you discuss any questions you have with your health care provider. Document Released: 04/06/2005 Document Revised: 12/16/2015 Document Reviewed: 06/06/2013 Elsevier Interactive Patient Education  2017 Elsevier Inc.      Agustina Caroli, MD [facility]

## 2016-07-28 LAB — CBC WITH DIFFERENTIAL/PLATELET
Basophils Absolute: 0 10*3/uL (ref 0.0–0.2)
Basos: 1 %
EOS (ABSOLUTE): 0.1 10*3/uL (ref 0.0–0.4)
Eos: 3 %
Hematocrit: 40 % (ref 37.5–51.0)
Hemoglobin: 13.7 g/dL (ref 13.0–17.7)
Immature Grans (Abs): 0 10*3/uL (ref 0.0–0.1)
Immature Granulocytes: 1 %
Lymphocytes Absolute: 1.1 10*3/uL (ref 0.7–3.1)
Lymphs: 26 %
MCH: 28.2 pg (ref 26.6–33.0)
MCHC: 34.3 g/dL (ref 31.5–35.7)
MCV: 83 fL (ref 79–97)
Monocytes Absolute: 0.3 10*3/uL (ref 0.1–0.9)
Monocytes: 7 %
Neutrophils Absolute: 2.8 10*3/uL (ref 1.4–7.0)
Neutrophils: 62 %
Platelets: 202 10*3/uL (ref 150–379)
RBC: 4.85 x10E6/uL (ref 4.14–5.80)
RDW: 14.9 % (ref 12.3–15.4)
WBC: 4.4 10*3/uL (ref 3.4–10.8)

## 2016-07-28 LAB — COMPREHENSIVE METABOLIC PANEL
ALT: 31 IU/L (ref 0–44)
AST: 20 IU/L (ref 0–40)
Albumin/Globulin Ratio: 1.8 (ref 1.2–2.2)
Albumin: 4.4 g/dL (ref 3.6–4.8)
Alkaline Phosphatase: 35 IU/L — ABNORMAL LOW (ref 39–117)
BUN/Creatinine Ratio: 16 (ref 10–24)
BUN: 18 mg/dL (ref 8–27)
Bilirubin Total: 0.2 mg/dL (ref 0.0–1.2)
CO2: 21 mmol/L (ref 18–29)
Calcium: 9.7 mg/dL (ref 8.6–10.2)
Chloride: 96 mmol/L (ref 96–106)
Creatinine, Ser: 1.12 mg/dL (ref 0.76–1.27)
GFR calc Af Amer: 82 mL/min/{1.73_m2} (ref >59)
GFR calc non Af Amer: 71 mL/min/{1.73_m2} (ref >59)
Globulin, Total: 2.4 g/dL (ref 1.5–4.5)
Glucose: 262 mg/dL — ABNORMAL HIGH (ref 65–99)
Potassium: 4.9 mmol/L (ref 3.5–5.2)
Sodium: 136 mmol/L (ref 134–144)
Total Protein: 6.8 g/dL (ref 6.0–8.5)

## 2016-07-28 LAB — LIPID PANEL
Chol/HDL Ratio: 5.7 ratio units — ABNORMAL HIGH (ref 0.0–5.0)
Cholesterol, Total: 149 mg/dL (ref 100–199)
HDL: 26 mg/dL — ABNORMAL LOW (ref >39)
Triglycerides: 455 mg/dL — ABNORMAL HIGH (ref 0–149)

## 2016-08-18 ENCOUNTER — Ambulatory Visit (INDEPENDENT_AMBULATORY_CARE_PROVIDER_SITE_OTHER): Payer: Managed Care, Other (non HMO) | Admitting: Interventional Cardiology

## 2016-08-18 ENCOUNTER — Encounter (INDEPENDENT_AMBULATORY_CARE_PROVIDER_SITE_OTHER): Payer: Self-pay

## 2016-08-18 ENCOUNTER — Encounter: Payer: Self-pay | Admitting: Interventional Cardiology

## 2016-08-18 VITALS — BP 138/58 | HR 78 | Ht 70.5 in | Wt 229.1 lb

## 2016-08-18 DIAGNOSIS — E782 Mixed hyperlipidemia: Secondary | ICD-10-CM | POA: Diagnosis not present

## 2016-08-18 DIAGNOSIS — Z951 Presence of aortocoronary bypass graft: Secondary | ICD-10-CM

## 2016-08-18 DIAGNOSIS — Z6832 Body mass index (BMI) 32.0-32.9, adult: Secondary | ICD-10-CM

## 2016-08-18 DIAGNOSIS — E669 Obesity, unspecified: Secondary | ICD-10-CM

## 2016-08-18 DIAGNOSIS — I2510 Atherosclerotic heart disease of native coronary artery without angina pectoris: Secondary | ICD-10-CM | POA: Diagnosis not present

## 2016-08-18 DIAGNOSIS — I10 Essential (primary) hypertension: Secondary | ICD-10-CM

## 2016-08-18 MED ORDER — ATORVASTATIN CALCIUM 40 MG PO TABS: 40.0000 mg | ORAL_TABLET | Freq: Every day | ORAL | 3 refills | Status: DC

## 2016-08-18 NOTE — Progress Notes (Signed)
Patient ID: [name] Love, male   DOB: Feb 03, 1956, 61 y.o.   MRN: [id number]     Cardiology Office Note   Date:  08/18/2016   ID:  [name] Love, [name] Love 1955/08/08, MRN [id number]  PCP:  [name], MD    No chief complaint on file. CAD   Wt Readings from Last 3 Encounters:  08/18/16 229 lb 1.9 oz (103.9 kg)  07/27/16 234 lb (106.1 kg)  10/08/15 235 lb (106.6 kg)       History of Present Illness: [name] Love is a 61 y.o. male  who had CABG in 2007. He has done well since that time. He had a nuclear stress test in 2012 that was ok. 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 He will do cardio and weights. No CP , SHOB. No sx like what he had prior to CABG.   No orthopnea, edema. No PND. No lightheadedness or syncope. DM control has been less than optimal.   He wants to get back into his exercise soon.  He is contemplating a better diet as well.   He still has managed to lose some weight over the last year.     Past Medical History:  Diagnosis Date  . Coronary artery disease    post CABG x3 in 2007  . Diabetes mellitus   . Hyperlipidemia   . Hypertension   . Obesity     Past Surgical History:  Procedure Laterality Date  . CARDIAC CATHETERIZATION  EF= 45-50%   EF= 45-50% -- Three-vessel coronary artery disease with high-grade lesions in the proximal left anterior descending artery and multiple lesions in the right coronary -- Low normal ejection fraction -- Stable exertional angina -- [name], M.D. [facility]  . CORONARY ARTERY BYPASS GRAFT  02/19/2006     x3 (left internal mammary artery to LAD, left radial artery to posterior descending, saphenous vein graft to first diagonal), endoscopic vein harvest right leg -- SURGEON:  [name]. [name], M.D.  . HERNIA REPAIR     at 61 y/o  . tripple bypass       Current Outpatient Prescriptions  Medication Sig Dispense  Refill  . albuterol (PROVENTIL HFA;VENTOLIN HFA) 108 (90 Base) MCG/ACT inhaler Inhale 2 puffs into the lungs every 4 (four) hours as needed for wheezing (cough, shortness of breath or wheezing.). 1 Inhaler 1  . aspirin EC 81 MG tablet Take 1 tablet (81 mg total) by mouth daily. 90 tablet 3  . chlorpheniramine-HYDROcodone (TUSSIONEX PENNKINETIC ER) 10-8 MG/5ML SUER Take 5 mLs by mouth every 12 (twelve) hours as needed for cough. 100 mL 0  . dapagliflozin propanediol (FARXIGA) 5 MG TABS tablet Take 5 mg by mouth daily. 30 tablet 6  . fenofibrate 160 MG tablet Take 1 tablet (160 mg total) by mouth daily. 90 tablet 3  . glipiZIDE (GLIPIZIDE XL) 5 MG 24 hr tablet Take 1 tablet (5 mg total) by mouth daily. 30 tablet 12  . losartan (COZAAR) 50 MG tablet Take 1 tablet (50 mg total) by mouth daily. 90 tablet 3  . metFORMIN (GLUCOPHAGE) 1000 MG tablet Take 1 tablet (1,000 mg total) by mouth 2 (two) times daily with a meal. 180 tablet 6  . Omega-3 Fatty Acids (FISH OIL PO) Take 2,000 mg by mouth 2 (two) times daily.      Marland Kitchen PROLENSA 0.07 % SOLN Place 1 drop into the left eye daily.  Reported on 10/08/2015    . simvastatin (ZOCOR) 40 MG tablet Take one daily after evening meal for cholesterol 30 tablet 12  . tadalafil (CIALIS) 5 MG tablet Take 1 tablet (5 mg total) by mouth daily as needed for erectile dysfunction. 30 tablet 2   No current facility-administered medications for this visit.     Allergies:   Patient has no known allergies.    Social History:  The patient  reports that he has never smoked. He has never used smokeless tobacco. He reports that he drinks about 0.6 oz of alcohol per week . He reports that he does not use drugs.   Family History:  The patient's *family history includes Cancer in his father; Heart disease in his mother.    ROS:  Please see the history of present illness.   Otherwise, review of systems are positive for right calf soreness- thinks he pulled a muscle.   All other  systems are reviewed and negative.    PHYSICAL EXAM: VS:  BP (!) 138/58 (BP Location: Left Arm, Patient Position: Sitting, Cuff Size: Normal)   Pulse 78   Ht 5' 10.5" (1.791 m)   Wt 229 lb 1.9 oz (103.9 kg)   BMI 32.41 kg/m  , BMI Body mass index is 32.41 kg/m. GEN: Well nourished, well developed, in no acute distress  HEENT: normal  Neck: no JVD, carotid bruits, or masses Cardiac: RRR; no murmurs, rubs, or gallops,no edema ; 2+ PT pulses bilaterally Respiratory:  clear to auscultation bilaterally, normal work of breathing GI: soft, nontender, nondistended, + BS MS: no deformity or atrophy  Skin: warm and dry, no rash Neuro:  Strength and sensation are intact Psych: euthymic mood, full affect   EKG:   The ekg ordered today demonstrates NSR, laateral ST depression-no change from 2016 ECG   Recent Labs: 10/08/2015: Hemoglobin 13.9 07/27/2016: ALT 31; BUN 18; Creatinine, Ser 1.12; Platelets 202; Potassium 4.9; Sodium 136   Lipid Panel    Component Value Date/Time   CHOL 149 07/27/2016 0837   TRIG 455 (H) 07/27/2016 0837   HDL 26 (L) 07/27/2016 0837   CHOLHDL 5.7 (H) 07/27/2016 0837   CHOLHDL 5.4 (H) 10/08/2015 1051   VLDL 76 (H) 10/08/2015 1051   [place] Comment 07/27/2016 0837     Other studies Reviewed: Additional studies/ records that were reviewed today with results demonstrating:  Nuclear stress test 2012.   ASSESSMENT AND PLAN:  1.  coronary artery disease: No angina.   No symptoms to what he had prior to CABG. 2.  hyperlipidemia: Triglycerides elevated. The should come down as his glucose improves. Stop simvastatin.  Start atorva 40 and check labs in a few months.  THis may help TG.  COnsider referral to lipid clinic.  Continue fenofibrate.  3.  abnormal ECG: Stable. LVEF 55-60 in 2014. 4.  obesity: I asked him to try and get down to 210 pounds by the time of his visit next year. He can improve his diet and increase exercise. He will let us know if he has any  cardiac symptoms. 5.  hypertension: Blood pressure well controlled. Continue current medications.  He has not checked this outside the hospital.  Increased exercise should help.   Would like to see the BP to be < 130/80.   Current medicines are reviewed at length with the patient today.  The patient concerns regarding his medicines were addressed.  The following changes have been made:  No change  Labs/ tests ordered  today include:  No orders of the defined types were placed in this encounter.   Recommend 150 minutes/week of aerobic exercise Low fat, low carb, high fiber diet recommended  Disposition:   FU in 1 year   Signed, Larae Grooms, MD  08/18/2016 8:45 AM    [facility] [place], [place]  [id number] Phone: [phone]; Fax: [phone]

## 2016-08-18 NOTE — Patient Instructions (Signed)
Medication Instructions:  Stop taking Simvastatin and start taking Atorvastatin 40 mg daily. All other medications remain the same.   Labwork: CMET and Lipids in 3 month (4/23) anytime between 7:30 and 5:00. Please do not eat or drink after midnight the night before labs are drawn.  Testing/Procedures: None  Follow-Up: Your physician wants you to follow-up in: 1 year.  You will receive a reminder letter in the mail two months in advance. If you don't receive a letter, please call our office to schedule the follow-up appointment.     If you need a refill on your cardiac medications before your next appointment, please call your pharmacy.

## 2016-08-29 ENCOUNTER — Telehealth: Payer: Self-pay

## 2016-08-29 NOTE — Telephone Encounter (Signed)
fyi

## 2016-08-29 NOTE — Telephone Encounter (Signed)
Pt would like a CB from Dr. Franky Macho. Pt states  he talked about being  put on a new script. He would only like a CB from Dr. Franky Macho. Please advise at [phone]

## 2016-08-31 MED ORDER — SILDENAFIL CITRATE 100 MG PO TABS: 100.0000 mg | ORAL_TABLET | Freq: Every day | ORAL | 11 refills | Status: DC | PRN

## 2016-11-13 ENCOUNTER — Other Ambulatory Visit: Payer: Managed Care, Other (non HMO)

## 2016-11-13 ENCOUNTER — Encounter (INDEPENDENT_AMBULATORY_CARE_PROVIDER_SITE_OTHER): Payer: Self-pay

## 2016-11-13 DIAGNOSIS — E782 Mixed hyperlipidemia: Secondary | ICD-10-CM

## 2016-11-13 LAB — COMPREHENSIVE METABOLIC PANEL
ALT: 26 IU/L (ref 0–44)
AST: 18 IU/L (ref 0–40)
Albumin/Globulin Ratio: 2 (ref 1.2–2.2)
Albumin: 4.3 g/dL (ref 3.6–4.8)
Alkaline Phosphatase: 36 IU/L — ABNORMAL LOW (ref 39–117)
BUN/Creatinine Ratio: 17 (ref 10–24)
BUN: 22 mg/dL (ref 8–27)
Bilirubin Total: 0.2 mg/dL (ref 0.0–1.2)
CO2: 24 mmol/L (ref 18–29)
Calcium: 9.3 mg/dL (ref 8.6–10.2)
Chloride: 102 mmol/L (ref 96–106)
Creatinine, Ser: 1.3 mg/dL — ABNORMAL HIGH (ref 0.76–1.27)
GFR calc Af Amer: 69 mL/min/{1.73_m2} (ref >59)
GFR calc non Af Amer: 59 mL/min/{1.73_m2} — ABNORMAL LOW (ref >59)
Globulin, Total: 2.2 g/dL (ref 1.5–4.5)
Glucose: 238 mg/dL — ABNORMAL HIGH (ref 65–99)
Potassium: 4.8 mmol/L (ref 3.5–5.2)
Sodium: 140 mmol/L (ref 134–144)
Total Protein: 6.5 g/dL (ref 6.0–8.5)

## 2016-11-13 LAB — LIPID PANEL
Chol/HDL Ratio: 5.5 ratio — ABNORMAL HIGH (ref 0.0–5.0)
Cholesterol, Total: 109 mg/dL (ref 100–199)
HDL: 20 mg/dL — ABNORMAL LOW (ref >39)
Triglycerides: 450 mg/dL — ABNORMAL HIGH (ref 0–149)

## 2017-02-06 ENCOUNTER — Other Ambulatory Visit: Payer: Self-pay | Admitting: *Deleted

## 2017-02-06 DIAGNOSIS — E1165 Type 2 diabetes mellitus with hyperglycemia: Secondary | ICD-10-CM

## 2017-02-06 DIAGNOSIS — IMO0002 Reserved for concepts with insufficient information to code with codable children: Secondary | ICD-10-CM

## 2017-02-06 DIAGNOSIS — E118 Type 2 diabetes mellitus with unspecified complications: Principal | ICD-10-CM

## 2017-02-06 MED ORDER — DAPAGLIFLOZIN PROPANEDIOL 5 MG PO TABS: 5.0000 mg | ORAL_TABLET | Freq: Every day | ORAL | 6 refills | Status: DC

## 2017-03-07 ENCOUNTER — Telehealth: Payer: Self-pay | Admitting: Emergency Medicine

## 2017-03-07 NOTE — Telephone Encounter (Signed)
Pt is needing a refill on his [place] number [phone]

## 2017-03-15 ENCOUNTER — Other Ambulatory Visit: Payer: Self-pay | Admitting: Emergency Medicine

## 2017-03-15 DIAGNOSIS — IMO0002 Reserved for concepts with insufficient information to code with codable children: Secondary | ICD-10-CM

## 2017-03-15 DIAGNOSIS — E118 Type 2 diabetes mellitus with unspecified complications: Principal | ICD-10-CM

## 2017-03-15 DIAGNOSIS — E1165 Type 2 diabetes mellitus with hyperglycemia: Secondary | ICD-10-CM

## 2017-03-15 MED ORDER — DAPAGLIFLOZIN PROPANEDIOL 5 MG PO TABS: 5.0000 mg | ORAL_TABLET | Freq: Every day | ORAL | 0 refills | Status: DC

## 2017-03-15 NOTE — Telephone Encounter (Signed)
Left message to return message 

## 2017-03-15 NOTE — Telephone Encounter (Signed)
Patient returned message. Advised he will need to schedule an office visit for additional refills.  Patient can not come in due to no insurance coverage until he gets [employer]. Ran out of [place] 2 months ago.  Refill given for 30 days until he gets insurance coverage. Advised to schedule office visit once covered.

## 2017-03-21 ENCOUNTER — Encounter: Payer: Self-pay | Admitting: Emergency Medicine

## 2017-03-21 ENCOUNTER — Ambulatory Visit (INDEPENDENT_AMBULATORY_CARE_PROVIDER_SITE_OTHER): Payer: 59 | Admitting: Emergency Medicine

## 2017-03-21 VITALS — BP 128/72 | HR 88 | Temp 98.9°F | Resp 16 | Ht 70.0 in | Wt 233.4 lb

## 2017-03-21 DIAGNOSIS — I10 Essential (primary) hypertension: Secondary | ICD-10-CM | POA: Diagnosis not present

## 2017-03-21 DIAGNOSIS — E1165 Type 2 diabetes mellitus with hyperglycemia: Secondary | ICD-10-CM

## 2017-03-21 DIAGNOSIS — J069 Acute upper respiratory infection, unspecified: Secondary | ICD-10-CM | POA: Diagnosis not present

## 2017-03-21 DIAGNOSIS — E118 Type 2 diabetes mellitus with unspecified complications: Secondary | ICD-10-CM | POA: Diagnosis not present

## 2017-03-21 DIAGNOSIS — E781 Pure hyperglyceridemia: Secondary | ICD-10-CM | POA: Diagnosis not present

## 2017-03-21 DIAGNOSIS — IMO0002 Reserved for concepts with insufficient information to code with codable children: Secondary | ICD-10-CM

## 2017-03-21 DIAGNOSIS — Z23 Encounter for immunization: Secondary | ICD-10-CM

## 2017-03-21 LAB — POCT GLYCOSYLATED HEMOGLOBIN (HGB A1C): Hemoglobin A1C: 9.5

## 2017-03-21 MED ORDER — PRAVASTATIN SODIUM 40 MG PO TABS: 40.0000 mg | ORAL_TABLET | Freq: Every day | ORAL | 3 refills | Status: DC

## 2017-03-21 MED ORDER — PROMETHAZINE-CODEINE 6.25-10 MG/5ML PO SYRP: 5.0000 mL | ORAL_SOLUTION | Freq: Every evening | ORAL | 0 refills | Status: DC | PRN

## 2017-03-21 MED ORDER — FREESTYLE LIBRE SENSOR SYSTEM MISC: 1 refills | Status: DC

## 2017-03-21 MED ORDER — LOSARTAN POTASSIUM 50 MG PO TABS: 50.0000 mg | ORAL_TABLET | Freq: Every day | ORAL | 3 refills | Status: DC

## 2017-03-21 MED ORDER — GLIPIZIDE ER 10 MG PO TB24: 10.0000 mg | ORAL_TABLET | Freq: Every day | ORAL | 3 refills | Status: DC

## 2017-03-21 MED ORDER — METFORMIN HCL 1000 MG PO TABS: 1000.0000 mg | ORAL_TABLET | Freq: Two times a day (BID) | ORAL | 6 refills | Status: DC

## 2017-03-21 NOTE — Patient Instructions (Addendum)
     IF you received an x-ray today, you will receive an invoice from [facility]. Please contact [facility] at [phone] with questions or concerns regarding your invoice.   IF you received labwork today, you will receive an invoice from [facility]. Please contact [facility] at [phone] with questions or concerns regarding your invoice.   Our billing staff will not be able to assist you with questions regarding bills from these companies.  You will be contacted with the lab results as soon as they are available. The fastest way to get your results is to activate your My Chart account. Instructions are located on the last page of this paperwork. If you have not heard from us regarding the results in 2 weeks, please contact this office.     Type 2 Diabetes Mellitus, Diagnosis, Adult Type 2 diabetes (type 2 diabetes mellitus) is a long-term (chronic) disease. It may be caused by one or both of these problems:  Your body does not make enough of a hormone called insulin.  Your body does not react in a normal way to insulin that it makes.  Insulin lets sugars (glucose) go into cells in the body. This gives you energy. If you have type 2 diabetes, sugars cannot get into cells. This causes high blood sugar (hyperglycemia). Your doctor will set treatment goals for you. Generally, you should have these blood sugar levels:  Before meals (preprandial): 80-130 mg/dL (4.4-7.2 mmol/L).  After meals (postprandial): below 180 mg/dL (10 mmol/L).  A1c (hemoglobin A1c) level: less than 7%.  Follow these instructions at home: Questions to Ask Your Doctor  You may want to ask these questions:  Do I need to meet with a diabetes educator?  Where can I find a support group for people with diabetes?  What equipment will I need to care for myself at home?  What diabetes medicines do I need? When should I take them?  How often do I need to check my blood sugar?  What number can  I call if I have questions?  When is my next doctor's visit?  General instructions  Take over-the-counter and prescription medicines only as told by your doctor.  Keep all follow-up visits as told by your doctor. This is important. Contact a doctor if:  Your blood sugar is at or above 240 mg/dL (13.3 mmol/L) for 2 days in a row.  You have been sick or have had a fever for 2 days or more and you are not getting better.  You have any of these problems for more than 6 hours: ? You cannot eat or drink. ? You feel sick to your stomach (nauseous). ? You throw up (vomit). ? You have watery poop (diarrhea). Get help right away if:  Your blood sugar is lower than 54 mg/dL (3 mmol/L).  You get confused.  You have trouble: ? Thinking clearly. ? Breathing.  You have moderate or large ketone levels in your pee (urine). This information is not intended to replace advice given to you by your health care provider. Make sure you discuss any questions you have with your health care provider. Document Released: 04/18/2008 Document Revised: 12/16/2015 Document Reviewed: 08/13/2015 Elsevier Interactive Patient Education  2018 Elsevier Inc.  

## 2017-03-21 NOTE — Progress Notes (Signed)
[name] Love 61 y.o.   Chief Complaint  Patient presents with  . Follow-up    per patient labwokr  . Cough    with runny nose x 2 days  . Medication Refill    FARXIGA,metformin, cozaar and Glucotrol XL    HISTORY OF PRESENT ILLNESS: This is a 61 y.o. male with h/o diabetes type2 last seen by me 07/27/16. Needs blood work and medications refills. Also c/o URI symptoms x 4 days. Sees Cardiologist and told everything is well with his heart. Has been off [place] due to insurance issues.  Cough  This is a new problem. The current episode started in the past 7 days. The problem has been gradually worsening. The problem occurs every few minutes. The cough is non-productive. Associated symptoms include nasal congestion. Pertinent negatives include no chest pain, chills, ear congestion, ear pain, fever, headaches, heartburn, hemoptysis, myalgias, rash, rhinorrhea, shortness of breath or wheezing. He has tried OTC cough suppressant for the symptoms. The treatment provided no relief. There is no history of asthma, COPD, emphysema or pneumonia.     Prior to Admission medications   Medication Sig Start Date End Date Taking? Authorizing Provider  albuterol (PROVENTIL HFA;VENTOLIN HFA) 108 (90 Base) MCG/ACT inhaler Inhale 2 puffs into the lungs every 4 (four) hours as needed for wheezing (cough, shortness of breath or wheezing.). 10/08/15  Yes [name], MD  aspirin 325 MG EC tablet Take 325 mg by mouth daily.   Yes [provider]  dapagliflozin propanediol (FARXIGA) 5 MG TABS tablet Take 5 mg by mouth daily. 30 day supply given until seen in clinic 03/15/17  Yes Ebany Bowermaster, [name], MD  fenofibrate 160 MG tablet Take 1 tablet (160 mg total) by mouth daily. 07/27/16  Yes Kerington Hildebrant, [name], MD  losartan (COZAAR) 50 MG tablet Take 1 tablet (50 mg total) by mouth daily. 03/21/17  Yes [name], MD  metFORMIN (GLUCOPHAGE) 1000 MG tablet Take 1 tablet (1,000 mg total)  by mouth 2 (two) times daily with a meal. 03/21/17  Yes Drakkar Medeiros, [name], MD  Omega-3 Fatty Acids (FISH OIL PO) Take 2,000 mg by mouth 2 (two) times daily.     Yes [provider]  sildenafil (VIAGRA) 100 MG tablet Take 1 tablet (100 mg total) by mouth daily as needed for erectile dysfunction. 08/31/16  Yes [name], MD  aspirin EC 81 MG tablet Take 1 tablet (81 mg total) by mouth daily. Patient not taking: Reported on 03/21/2017 02/04/15   [name], MD  atorvastatin (LIPITOR) 40 MG tablet Take 1 tablet (40 mg total) by mouth daily. 08/18/16 11/16/16  [name], MD  chlorpheniramine-HYDROcodone [facility] PENNKINETIC ER) 10-8 MG/5ML SUER Take 5 mLs by mouth every 12 (twelve) hours as needed for cough. Patient not taking: Reported on 03/21/2017 09/20/15   Ezekiel Slocumb, PA-C  Continuous Blood Gluc Sensor ([place]) MISC Use as indicated 03/21/17   [name], MD  glipiZIDE (GLIPIZIDE XL) 10 MG 24 hr tablet Take 1 tablet (10 mg total) by mouth daily with breakfast. 03/21/17   [name], MD  pravastatin (PRAVACHOL) 40 MG tablet Take 1 tablet (40 mg total) by mouth daily. 03/21/17   [name], MD  PROLENSA 0.07 % SOLN Place 1 drop into the left eye daily. Reported on 10/08/2015 12/18/14   [provider]  promethazine-codeine (PHENERGAN WITH CODEINE) 6.25-10 MG/5ML syrup Take 5 mLs by mouth at bedtime as  needed for cough. 03/21/17   [name], MD  tadalafil (CIALIS) 5 MG tablet Take 1 tablet (5 mg total) by mouth daily as needed for erectile dysfunction. Patient not taking: Reported on 03/21/2017 09/24/14   [name], MD    No Known Allergies  Patient Active Problem List   Diagnosis Date Noted  . Erectile dysfunction 09/15/2013  . Nonspecific abnormal electrocardiogram (ECG) (EKG) 06/10/2013  . Type II or unspecified type diabetes mellitus without mention of complication, not stated  as uncontrolled 09/12/2012  . Coronary artery disease 07/14/2011  . History of coronary artery bypass surgery 07/14/2011  . DM (diabetes mellitus), type 2, uncontrolled with complications ([place]) 38/75/6433  . Hyperlipidemia 02/23/2009  . Obesity 02/23/2009  . Essential hypertension 02/23/2009    Past Medical History:  Diagnosis Date  . Coronary artery disease    post CABG x3 in 2007  . Diabetes mellitus   . Hyperlipidemia   . Hypertension   . Obesity     Past Surgical History:  Procedure Laterality Date  . CARDIAC CATHETERIZATION  EF= 45-50%   EF= 45-50% -- Three-vessel coronary artery disease with high-grade lesions in the proximal left anterior descending artery and multiple lesions in the right coronary -- Low normal ejection fraction -- Stable exertional angina -- [name], M.D. [facility]  . CORONARY ARTERY BYPASS GRAFT  02/19/2006     x3 (left internal mammary artery to LAD, left radial artery to posterior descending, saphenous vein graft to first diagonal), endoscopic vein harvest right leg -- SURGEON:  [name]. [name], M.D.  . HERNIA REPAIR     at 61 y/o  . tripple bypass      Social History   Social History  . Marital status: Married    Spouse name: N/A  . Number of children: N/A  . Years of education: N/A   Occupational History  . Not on file.   Social History Main Topics  . Smoking status: Never Smoker  . Smokeless tobacco: Never Used  . Alcohol use 0.6 oz/week    1 Standard drinks or equivalent per week  . Drug use: No  . Sexual activity: Yes   Other Topics Concern  . Not on file   Social History Narrative  . No narrative on file    Family History  Problem Relation Age of Onset  . Heart disease Mother   . Cancer Father      Review of Systems  Constitutional: Negative.  Negative for chills and fever.  HENT: Negative for congestion, ear pain, nosebleeds and rhinorrhea.   Eyes: Negative.  Negative for blurred vision and double vision.    Respiratory: Positive for cough. Negative for hemoptysis, shortness of breath and wheezing.   Cardiovascular: Negative.  Negative for chest pain and palpitations.  Gastrointestinal: Positive for diarrhea (chronic). Negative for abdominal pain, heartburn, nausea and vomiting.  Genitourinary: Negative for dysuria and hematuria.  Musculoskeletal: Negative for back pain, myalgias and neck pain.  Skin: Negative.  Negative for rash.  Neurological: Negative.  Negative for dizziness and headaches.  Endo/Heme/Allergies: Negative.   All other systems reviewed and are negative.  Vitals:   03/21/17 0921  BP: 128/72  Pulse: 88  Resp: 16  Temp: 98.9 F (37.2 C)  SpO2: 96%   Recent Results (from the past 2160 hour(s))  POCT glycosylated hemoglobin (Hb A1C)     Status: None   Collection Time: 03/21/17 10:30 AM  Result Value Ref Range   Hemoglobin A1C 9.5  Physical Exam  Constitutional: He is oriented to person, place, and time. He appears well-developed and well-nourished.  HENT:  Head: Normocephalic and atraumatic.  Nose: Nose normal.  Mouth/Throat: Oropharynx is clear and moist. No oropharyngeal exudate.  Eyes: Pupils are equal, round, and reactive to light. Conjunctivae and EOM are normal.  Neck: Normal range of motion. Neck supple. No JVD present. No thyromegaly present.  Cardiovascular: Normal rate, regular rhythm and normal heart sounds.   Pulmonary/Chest: Effort normal and breath sounds normal.  Abdominal: Soft. Bowel sounds are normal. He exhibits no distension. There is no tenderness.  Musculoskeletal: Normal range of motion.  Lymphadenopathy:    He has no cervical adenopathy.  Neurological: He is alert and oriented to person, place, and time. No sensory deficit. He exhibits normal muscle tone.  Skin: Skin is warm and dry. Capillary refill takes less than 2 seconds. No rash noted.  Psychiatric: He has a normal mood and affect. His behavior is normal.  Vitals  reviewed.    ASSESSMENT & PLAN: Maxden was seen today for follow-up, cough and medication refill.  Diagnoses and all orders for this visit:  Acute upper respiratory infection -     promethazine-codeine (PHENERGAN WITH CODEINE) 6.25-10 MG/5ML syrup; Take 5 mLs by mouth at bedtime as needed for cough.  Type 2 diabetes mellitus with hyperglycemia, without long-term current use of insulin (HCC) -     POCT glycosylated hemoglobin (Hb A1C) -     Ambulatory referral to Endocrinology -     Continuous Blood Gluc Sensor ([place]) MISC; Use as indicated  Essential hypertension -     CBC with Differential/Platelet -     Comprehensive metabolic panel -     losartan (COZAAR) 50 MG tablet; Take 1 tablet (50 mg total) by mouth daily.  High blood triglycerides -     Lipid panel -     pravastatin (PRAVACHOL) 40 MG tablet; Take 1 tablet (40 mg total) by mouth daily.  Uncontrolled type 2 diabetes mellitus with complication, without long-term current use of insulin (HCC) -     metFORMIN (GLUCOPHAGE) 1000 MG tablet; Take 1 tablet (1,000 mg total) by mouth 2 (two) times daily with a meal.  Need for prophylactic vaccination and inoculation against influenza -     Flu Vaccine QUAD 6+ mos PF IM (Fluarix Quad PF)  Other orders -     glipiZIDE (GLIPIZIDE XL) 10 MG 24 hr tablet; Take 1 tablet (10 mg total) by mouth daily with breakfast.  Return in 3 months.  Patient Instructions       IF you received an x-ray today, you will receive an invoice from [facility]. Please contact [facility] at [phone] with questions or concerns regarding your invoice.   IF you received labwork today, you will receive an invoice from [facility]. Please contact [facility] at [phone] with questions or concerns regarding your invoice.   Our billing staff will not be able to assist you with questions regarding bills from these companies.  You will be contacted with the lab  results as soon as they are available. The fastest way to get your results is to activate your My Chart account. Instructions are located on the last page of this paperwork. If you have not heard from [place] regarding the results in 2 weeks, please contact this office.     Type 2 Diabetes Mellitus, Diagnosis, Adult Type 2 diabetes (type 2 diabetes mellitus) is a long-term (chronic) disease. It may be  caused by one or both of these problems:  Your body does not make enough of a hormone called insulin.  Your body does not react in a normal way to insulin that it makes.  Insulin lets sugars (glucose) go into cells in the body. This gives you energy. If you have type 2 diabetes, sugars cannot get into cells. This causes high blood sugar (hyperglycemia). Your doctor will set treatment goals for you. Generally, you should have these blood sugar levels:  Before meals (preprandial): 80-130 mg/dL (4.4-7.2 mmol/L).  After meals (postprandial): below 180 mg/dL (10 mmol/L).  A1c (hemoglobin A1c) level: less than 7%.  Follow these instructions at home: Questions to Ask Your Doctor  You may want to ask these questions:  Do I need to meet with a diabetes educator?  Where can I find a support group for people with diabetes?  What equipment will I need to care for myself at home?  What diabetes medicines do I need? When should I take them?  How often do I need to check my blood sugar?  What number can I call if I have questions?  When is my next doctor's visit?  General instructions  Take over-the-counter and prescription medicines only as told by your doctor.  Keep all follow-up visits as told by your doctor. This is important. Contact a doctor if:  Your blood sugar is at or above 240 mg/dL (13.3 mmol/L) for 2 days in a row.  You have been sick or have had a fever for 2 days or more and you are not getting better.  You have any of these problems for more than 6 hours: ? You cannot eat  or drink. ? You feel sick to your stomach (nauseous). ? You throw up (vomit). ? You have watery poop (diarrhea). Get help right away if:  Your blood sugar is lower than 54 mg/dL (3 mmol/L).  You get confused.  You have trouble: ? Thinking clearly. ? Breathing.  You have moderate or large ketone levels in your pee (urine). This information is not intended to replace advice given to you by your health care provider. Make sure you discuss any questions you have with your health care provider. Document Released: 04/18/2008 Document Revised: 12/16/2015 Document Reviewed: 08/13/2015 Elsevier Interactive Patient Education  2018 Elsevier Inc.       Agustina Caroli, MD [facility]

## 2017-03-22 ENCOUNTER — Other Ambulatory Visit: Payer: Self-pay | Admitting: Emergency Medicine

## 2017-03-22 ENCOUNTER — Telehealth: Payer: Self-pay | Admitting: Emergency Medicine

## 2017-03-22 DIAGNOSIS — E1165 Type 2 diabetes mellitus with hyperglycemia: Secondary | ICD-10-CM

## 2017-03-22 DIAGNOSIS — IMO0002 Reserved for concepts with insufficient information to code with codable children: Secondary | ICD-10-CM

## 2017-03-22 DIAGNOSIS — E118 Type 2 diabetes mellitus with unspecified complications: Principal | ICD-10-CM

## 2017-03-22 LAB — CBC WITH DIFFERENTIAL/PLATELET
Basophils Absolute: 0 10*3/uL (ref 0.0–0.2)
Basos: 0 %
EOS (ABSOLUTE): 0.2 10*3/uL (ref 0.0–0.4)
Eos: 4 %
Hematocrit: 41.4 % (ref 37.5–51.0)
Hemoglobin: 13.7 g/dL (ref 13.0–17.7)
Immature Grans (Abs): 0 10*3/uL (ref 0.0–0.1)
Immature Granulocytes: 0 %
Lymphocytes Absolute: 1.1 10*3/uL (ref 0.7–3.1)
Lymphs: 24 %
MCH: 27 pg (ref 26.6–33.0)
MCHC: 33.1 g/dL (ref 31.5–35.7)
MCV: 82 fL (ref 79–97)
Monocytes Absolute: 0.4 10*3/uL (ref 0.1–0.9)
Monocytes: 9 %
Neutrophils Absolute: 2.8 10*3/uL (ref 1.4–7.0)
Neutrophils: 63 %
Platelets: 176 10*3/uL (ref 150–379)
RBC: 5.07 x10E6/uL (ref 4.14–5.80)
RDW: 15.3 % (ref 12.3–15.4)
WBC: 4.5 10*3/uL (ref 3.4–10.8)

## 2017-03-22 LAB — COMPREHENSIVE METABOLIC PANEL
ALT: 34 IU/L (ref 0–44)
AST: 27 IU/L (ref 0–40)
Albumin/Globulin Ratio: 2 (ref 1.2–2.2)
Albumin: 4.4 g/dL (ref 3.6–4.8)
Alkaline Phosphatase: 38 IU/L — ABNORMAL LOW (ref 39–117)
BUN/Creatinine Ratio: 14 (ref 10–24)
BUN: 15 mg/dL (ref 8–27)
Bilirubin Total: 0.2 mg/dL (ref 0.0–1.2)
CO2: 22 mmol/L (ref 20–29)
Calcium: 9.1 mg/dL (ref 8.6–10.2)
Chloride: 102 mmol/L (ref 96–106)
Creatinine, Ser: 1.09 mg/dL (ref 0.76–1.27)
GFR calc Af Amer: 85 mL/min/{1.73_m2} (ref >59)
GFR calc non Af Amer: 73 mL/min/{1.73_m2} (ref >59)
Globulin, Total: 2.2 g/dL (ref 1.5–4.5)
Glucose: 267 mg/dL — ABNORMAL HIGH (ref 65–99)
Potassium: 4.6 mmol/L (ref 3.5–5.2)
Sodium: 141 mmol/L (ref 134–144)
Total Protein: 6.6 g/dL (ref 6.0–8.5)

## 2017-03-22 LAB — LIPID PANEL
Chol/HDL Ratio: 4 ratio (ref 0.0–5.0)
Cholesterol, Total: 88 mg/dL — ABNORMAL LOW (ref 100–199)
HDL: 22 mg/dL — ABNORMAL LOW (ref >39)
LDL Calculated: 4 mg/dL (ref 0–99)
Triglycerides: 310 mg/dL — ABNORMAL HIGH (ref 0–149)
VLDL Cholesterol Cal: 62 mg/dL — ABNORMAL HIGH (ref 5–40)

## 2017-03-22 MED ORDER — DAPAGLIFLOZIN PROPANEDIOL 5 MG PO TABS: 5.0000 mg | ORAL_TABLET | Freq: Every day | ORAL | 3 refills | Status: DC

## 2017-03-22 NOTE — Telephone Encounter (Signed)
Pt states that when he went to pharmacy to get his free style meter he only got the part that goes in his arm and needs everything else called in  Best number [phone]

## 2017-03-23 NOTE — Telephone Encounter (Signed)
Spoke to patient and he said he did not get his sensor.  I see that the sensor was ordered and recommended that he check with his pharmacy.  He said he will do so.

## 2017-04-02 ENCOUNTER — Other Ambulatory Visit: Payer: Self-pay

## 2017-04-02 ENCOUNTER — Telehealth: Payer: Self-pay | Admitting: Emergency Medicine

## 2017-04-02 DIAGNOSIS — E785 Hyperlipidemia, unspecified: Secondary | ICD-10-CM

## 2017-04-02 MED ORDER — FENOFIBRATE 160 MG PO TABS: 160.0000 mg | ORAL_TABLET | Freq: Every day | ORAL | 0 refills | Status: DC

## 2017-04-02 MED ORDER — FENOFIBRATE 160 MG PO TABS: ORAL_TABLET | ORAL | 0 refills | Status: DC

## 2017-04-02 NOTE — Telephone Encounter (Signed)
Pt is needing a refill on fenofibrate sent to [facility] on [phone]   Best number [phone]  Plus he has other questions he would no got into with me

## 2017-04-02 NOTE — Telephone Encounter (Signed)
Rx sent in for a 30d supply..due to pt needs an office visit

## 2017-04-03 NOTE — Telephone Encounter (Signed)
PLEASE SEE THE PHONE MESSAGE DONNA PUT IN ON 04/02/17. HE STILL WANTS TO BE CALLED BACK WITH OTHER QUESTIONS HE HAS. BEST PHONE [phone] (CELL) [place]

## 2017-04-05 ENCOUNTER — Telehealth: Payer: Self-pay

## 2017-04-05 ENCOUNTER — Other Ambulatory Visit: Payer: Self-pay

## 2017-04-05 DIAGNOSIS — E1165 Type 2 diabetes mellitus with hyperglycemia: Secondary | ICD-10-CM

## 2017-04-05 DIAGNOSIS — E118 Type 2 diabetes mellitus with unspecified complications: Principal | ICD-10-CM

## 2017-04-05 DIAGNOSIS — IMO0002 Reserved for concepts with insufficient information to code with codable children: Secondary | ICD-10-CM

## 2017-04-05 MED ORDER — DAPAGLIFLOZIN PROPANEDIOL 5 MG PO TABS: 5.0000 mg | ORAL_TABLET | Freq: Every day | ORAL | 0 refills | Status: DC

## 2017-04-05 NOTE — Telephone Encounter (Signed)
Call placed to patient as requested, patient states that he is no longer using [facility], only [facility]  on [address]. Patient requesting that rx for Wilder Glade be sent to that pharmacy. Medication sent at this time./ S.Janis Cuffe,CMA

## 2017-04-05 NOTE — Telephone Encounter (Signed)
Spoke with patient, documented in the chart./ S.Hennie Gosa,CMA

## 2017-04-09 ENCOUNTER — Encounter: Payer: Self-pay | Admitting: Family Medicine

## 2017-04-09 ENCOUNTER — Ambulatory Visit (INDEPENDENT_AMBULATORY_CARE_PROVIDER_SITE_OTHER): Payer: 59 | Admitting: Family Medicine

## 2017-04-09 VITALS — BP 136/75 | HR 88 | Temp 98.1°F | Resp 16 | Ht 70.0 in | Wt 233.0 lb

## 2017-04-09 DIAGNOSIS — Z87898 Personal history of other specified conditions: Secondary | ICD-10-CM | POA: Diagnosis not present

## 2017-04-09 DIAGNOSIS — R059 Cough, unspecified: Secondary | ICD-10-CM

## 2017-04-09 DIAGNOSIS — J22 Unspecified acute lower respiratory infection: Secondary | ICD-10-CM

## 2017-04-09 DIAGNOSIS — R05 Cough: Secondary | ICD-10-CM | POA: Diagnosis not present

## 2017-04-09 MED ORDER — AZITHROMYCIN 250 MG PO TABS: ORAL_TABLET | ORAL | 0 refills | Status: DC

## 2017-04-09 MED ORDER — ALBUTEROL SULFATE HFA 108 (90 BASE) MCG/ACT IN AERS: 1.0000 | INHALATION_SPRAY | RESPIRATORY_TRACT | 0 refills | Status: DC | PRN

## 2017-04-09 MED ORDER — BENZONATATE 100 MG PO CAPS: 100.0000 mg | ORAL_CAPSULE | Freq: Three times a day (TID) | ORAL | 0 refills | Status: DC | PRN

## 2017-04-09 NOTE — Progress Notes (Signed)
Subjective:  By signing my name below, I, [name] Love, attest that this documentation has been prepared under the direction and in the presence of [name] Agreste, MD Electronically Signed: Ladene Artist, ED Scribe 04/09/2017 at 3:33 PM.   Patient ID: [name] Love, male    DOB: January 10, 1956, 61 y.o.   MRN: [id number]  Chief Complaint  Patient presents with  . Cough    x 1 wk/ productive   HPI [name] Love is a 61 y.o. male who presents to Primary Care at [facility] complaining of productive cough x 1 week. Seen 8/29 with URI. Symptoms x 7 days at that time. Thought to have viral URI. treated with phenergan with codeine syrup.   Pt states that symptoms started as dry cough and intermittent congestion but has progressed to productive cough x 1 week. He has also noticed wheezing only with lying down. Pt has been using Equate cough syrup without significant relief. Denies fever, sob, h/o asthma or COPD. Reports similar episode in February 2017 that required z-pak and inhaler. Normal CXR in March 2017. Pt is a nonsmoker.  Patient Active Problem List   Diagnosis Date Noted  . Acute upper respiratory infection 03/21/2017  . Need for prophylactic vaccination and inoculation against influenza 03/21/2017  . Erectile dysfunction 09/15/2013  . Nonspecific abnormal electrocardiogram (ECG) (EKG) 06/10/2013  . Type 2 diabetes mellitus with hyperglycemia, without long-term current use of insulin ([place]) 09/12/2012  . Coronary artery disease 07/14/2011  . History of coronary artery bypass surgery 07/14/2011  . DM (diabetes mellitus), type 2, uncontrolled with complications ([place]) 07/37/1062  . High blood triglycerides 02/23/2009  . Obesity 02/23/2009  . Essential hypertension 02/23/2009   Past Medical History:  Diagnosis Date  . Coronary artery disease    post CABG x3 in 2007  . Diabetes mellitus   . Hyperlipidemia   . Hypertension   . Obesity    Past Surgical History:    Procedure Laterality Date  . CARDIAC CATHETERIZATION  EF= 45-50%   EF= 45-50% -- Three-vessel coronary artery disease with high-grade lesions in the proximal left anterior descending artery and multiple lesions in the right coronary -- Low normal ejection fraction -- Stable exertional angina -- [name] Love, M.D. [facility]  . CORONARY ARTERY BYPASS GRAFT  02/19/2006     x3 (left internal mammary artery to LAD, left radial artery to posterior descending, saphenous vein graft to first diagonal), endoscopic vein harvest right leg -- SURGEON:  [name]. [name], M.D.  . HERNIA REPAIR     at 61 y/o  . tripple bypass     No Known Allergies Prior to Admission medications   Medication Sig Start Date End Date Taking? Authorizing Provider  albuterol (PROVENTIL HFA;VENTOLIN HFA) 108 (90 Base) MCG/ACT inhaler Inhale 2 puffs into the lungs every 4 (four) hours as needed for wheezing (cough, shortness of breath or wheezing.). 10/08/15   [name], MD  aspirin 325 MG EC tablet Take 325 mg by mouth daily.    [provider]  aspirin EC 81 MG tablet Take 1 tablet (81 mg total) by mouth daily. Patient not taking: Reported on 03/21/2017 02/04/15   [name] Booze, MD  atorvastatin (LIPITOR) 40 MG tablet Take 1 tablet (40 mg total) by mouth daily. 08/18/16 11/16/16  [name] Booze, MD  chlorpheniramine-HYDROcodone [facility] PENNKINETIC ER) 10-8 MG/5ML SUER Take 5 mLs by mouth every 12 (twelve) hours as needed for cough. Patient not taking: Reported on 03/21/2017 09/20/15   [name] Love,  [name] Pott, PA-C  Continuous Blood Gluc Sensor ([place]) MISC Use as indicated 03/21/17   [name], MD  dapagliflozin propanediol (FARXIGA) 5 MG TABS tablet Take 5 mg by mouth daily. 30 day supply given until seen in clinic 04/05/17 07/04/17  [name], MD  fenofibrate 160 MG tablet Take 1 tablet (160 mg total) by mouth daily. Needs office visit for further refills 04/02/17    [name], MD  glipiZIDE (GLIPIZIDE XL) 10 MG 24 hr tablet Take 1 tablet (10 mg total) by mouth daily with breakfast. 03/21/17   [name], MD  losartan (COZAAR) 50 MG tablet Take 1 tablet (50 mg total) by mouth daily. 03/21/17   [name], MD  metFORMIN (GLUCOPHAGE) 1000 MG tablet Take 1 tablet (1,000 mg total) by mouth 2 (two) times daily with a meal. 03/21/17   [name], [name] Bloomer, MD  Omega-3 Fatty Acids (FISH OIL PO) Take 2,000 mg by mouth 2 (two) times daily.      [provider]  pravastatin (PRAVACHOL) 40 MG tablet Take 1 tablet (40 mg total) by mouth daily. 03/21/17   [name], MD  PROLENSA 0.07 % SOLN Place 1 drop into the left eye daily. Reported on 10/08/2015 12/18/14   [provider]  promethazine-codeine (PHENERGAN WITH CODEINE) 6.25-10 MG/5ML syrup Take 5 mLs by mouth at bedtime as needed for cough. 03/21/17   [name], MD  sildenafil (VIAGRA) 100 MG tablet Take 1 tablet (100 mg total) by mouth daily as needed for erectile dysfunction. 08/31/16   [name], MD  tadalafil (CIALIS) 5 MG tablet Take 1 tablet (5 mg total) by mouth daily as needed for erectile dysfunction. Patient not taking: Reported on 03/21/2017 09/24/14   [name], MD   Social History   Social History  . Marital status: Married    Spouse name: N/A  . Number of children: N/A  . Years of education: N/A   Occupational History  . Not on file.   Social History Main Topics  . Smoking status: Never Smoker  . Smokeless tobacco: Never Used  . Alcohol use 0.6 oz/week    1 Standard drinks or equivalent per week  . Drug use: No  . Sexual activity: Yes   Other Topics Concern  . Not on file   Social History Narrative  . No narrative on file   Review of Systems  Constitutional: Negative for fever.  Respiratory: Positive for cough and wheezing. Negative for shortness of breath.       Objective:   Physical Exam    Constitutional: He is oriented to person, place, and time. He appears well-developed and well-nourished.  HENT:  Head: Normocephalic and atraumatic.  Right Ear: Tympanic membrane, external ear and ear canal normal.  Left Ear: Tympanic membrane, external ear and ear canal normal.  Nose: No rhinorrhea. Right sinus exhibits no maxillary sinus tenderness and no frontal sinus tenderness. Left sinus exhibits no maxillary sinus tenderness and no frontal sinus tenderness.  Mouth/Throat: Oropharynx is clear and moist and mucous membranes are normal. No oropharyngeal exudate or posterior oropharyngeal erythema.  Eyes: Pupils are equal, round, and reactive to light. Conjunctivae are normal.  Neck: Neck supple.  Cardiovascular: Normal rate, regular rhythm, normal heart sounds and intact distal pulses.   No murmur heard. Pulmonary/Chest: Effort normal and breath sounds normal. He has no wheezes. He has no rhonchi. He has no rales.  Few coarse breath  sounds at L base.  Abdominal: Soft. There is no tenderness.  Musculoskeletal: He exhibits no edema.  Lymphadenopathy:    He has no cervical adenopathy.  Neurological: He is alert and oriented to person, place, and time.  Skin: Skin is warm and dry. No rash noted.  Psychiatric: He has a normal mood and affect. His behavior is normal.  Vitals reviewed.  Vitals:   04/09/17 1518  BP: 136/75  Pulse: 88  Resp: 16  Temp: 98.1 F (36.7 C)  TempSrc: Oral  SpO2: 95%  Weight: 233 lb (105.7 kg)  Height: 5\' 10"  (1.778 m)      Assessment & Plan:    Jovani Flury is a 61 y.o. male Cough - Plan: azithromycin (ZITHROMAX) 250 MG tablet, benzonatate (TESSALON) 100 MG capsule  LRTI (lower respiratory tract infection) - Plan: azithromycin (ZITHROMAX) 250 MG tablet, benzonatate (TESSALON) 100 MG capsule  History of wheezing - Plan: albuterol (PROVENTIL HFA;VENTOLIN HFA) 108 (90 Base) MCG/ACT inhaler  Suspected initial viral infection with secondary  lower respiratory tract infection/bronchitis, or early community-acquired pneumonia with few coarse breath sounds left lower lobe. Afebrile, reassuring O2 sat.  -X-ray deferred at present, start azithromycin, Tessalon Perles for cough or Mucinex if not causing wheezing, albuterol if needed for wheeze. RTC precautions if persistent/frequent use of albuterol.  -RTC precautions discussed.  Meds ordered this encounter  Medications  . azithromycin (ZITHROMAX) 250 MG tablet    Sig: Take 2 pills by mouth on day 1, then 1 pill by mouth per day on days 2 through 5.    Dispense:  6 tablet    Refill:  0  . benzonatate (TESSALON) 100 MG capsule    Sig: Take 1 capsule (100 mg total) by mouth 3 (three) times daily as needed for cough.    Dispense:  20 capsule    Refill:  0  . albuterol (PROVENTIL HFA;VENTOLIN HFA) 108 (90 Base) MCG/ACT inhaler    Sig: Inhale 1-2 puffs into the lungs every 4 (four) hours as needed for wheezing or shortness of breath.    Dispense:  1 Inhaler    Refill:  0   Patient Instructions    Start azithromycin for possible secondary infection such as bronchitis or early pneumonia. Tessalon Perles as needed 3 times per day for cough. Mucinex over-the-counter is okay unless it makes you wheeze. albuterol inhaler was refilled/printed if you need it. If you do require that medication more than 2 times per day, or persistently needed for more than 2-3 days in a row, please return for recheck.  Return to the clinic or go to the nearest emergency room if any of your symptoms worsen or new symptoms occur.   Acute Bronchitis, Adult Acute bronchitis is sudden (acute) swelling of the air tubes (bronchi) in the lungs. Acute bronchitis causes these tubes to fill with mucus, which can make it hard to breathe. It can also cause coughing or wheezing. In adults, acute bronchitis usually goes away within 2 weeks. A cough caused by bronchitis may last up to 3 weeks. Smoking, allergies, and asthma  can make the condition worse. Repeated episodes of bronchitis may cause further lung problems, such as chronic obstructive pulmonary disease (COPD). What are the causes? This condition can be caused by germs and by substances that irritate the lungs, including:  Cold and flu viruses. This condition is most often caused by the same virus that causes a cold.  Bacteria.  Exposure to tobacco smoke, dust, fumes, and air pollution.  What increases the risk? This condition is more likely to develop in people who:  Have close contact with someone with acute bronchitis.  Are exposed to lung irritants, such as tobacco smoke, dust, fumes, and vapors.  Have a weak immune system.  Have a respiratory condition such as asthma.  What are the signs or symptoms? Symptoms of this condition include:  A cough.  Coughing up clear, yellow, or green mucus.  Wheezing.  Chest congestion.  Shortness of breath.  A fever.  Body aches.  Chills.  A sore throat.  How is this diagnosed? This condition is usually diagnosed with a physical exam. During the exam, your health care provider may order tests, such as chest X-rays, to rule out other conditions. He or she may also:  Test a sample of your mucus for bacterial infection.  Check the level of oxygen in your blood. This is done to check for pneumonia.  Do a chest X-ray or lung function testing to rule out pneumonia and other conditions.  Perform blood tests.  Your health care provider will also ask about your symptoms and medical history. How is this treated? Most cases of acute bronchitis clear up over time without treatment. Your health care provider may recommend:  Drinking more fluids. Drinking more makes your mucus thinner, which may make it easier to breathe.  Taking a medicine for a fever or cough.  Taking an antibiotic medicine.  Using an inhaler to help improve shortness of breath and to control a cough.  Using a cool mist  vaporizer or humidifier to make it easier to breathe.  Follow these instructions at home: Medicines  Take over-the-counter and prescription medicines only as told by your health care provider.  If you were prescribed an antibiotic, take it as told by your health care provider. Do not stop taking the antibiotic even if you start to feel better. General instructions  Get plenty of rest.  Drink enough fluids to keep your urine clear or pale yellow.  Avoid smoking and secondhand smoke. Exposure to cigarette smoke or irritating chemicals will make bronchitis worse. If you smoke and you need help quitting, ask your health care provider. Quitting smoking will help your lungs heal faster.  Use an inhaler, cool mist vaporizer, or humidifier as told by your health care provider.  Keep all follow-up visits as told by your health care provider. This is important. How is this prevented? To lower your risk of getting this condition again:  Wash your hands often with soap and water. If soap and water are not available, use hand sanitizer.  Avoid contact with people who have cold symptoms.  Try not to touch your hands to your mouth, nose, or eyes.  Make sure to get the flu shot every year.  Contact a health care provider if:  Your symptoms do not improve in 2 weeks of treatment. Get help right away if:  You cough up blood.  You have chest pain.  You have severe shortness of breath.  You become dehydrated.  You faint or keep feeling like you are going to faint.  You keep vomiting.  You have a severe headache.  Your fever or chills gets worse. This information is not intended to replace advice given to you by your health care provider. Make sure you discuss any questions you have with your health care provider. Document Released: 08/17/2004 Document Revised: 02/02/2016 Document Reviewed: 12/29/2015 Elsevier Interactive Patient Education  2017 [employer]  Cough, Adult Coughing  is a reflex that clears your throat and your airways. Coughing helps to heal and protect your lungs. It is normal to cough occasionally, but a cough that happens with other symptoms or lasts a long time may be a sign of a condition that needs treatment. A cough may last only 2-3 weeks (acute), or it may last longer than 8 weeks (chronic). What are the causes? Coughing is commonly caused by:  Breathing in substances that irritate your lungs.  A viral or bacterial respiratory infection.  Allergies.  Asthma.  Postnasal drip.  Smoking.  Acid backing up from the stomach into the esophagus (gastroesophageal reflux).  Certain medicines.  Chronic lung problems, including COPD (or rarely, lung cancer).  Other medical conditions such as heart failure.  Follow these instructions at home: Pay attention to any changes in your symptoms. Take these actions to help with your discomfort:  Take medicines only as told by your health care provider. ? If you were prescribed an antibiotic medicine, take it as told by your health care provider. Do not stop taking the antibiotic even if you start to feel better. ? Talk with your health care provider before you take a cough suppressant medicine.  Drink enough fluid to keep your urine clear or pale yellow.  If the air is dry, use a cold steam vaporizer or humidifier in your bedroom or your home to help loosen secretions.  Avoid anything that causes you to cough at work or at home.  If your cough is worse at night, try sleeping in a semi-upright position.  Avoid cigarette smoke. If you smoke, quit smoking. If you need help quitting, ask your health care provider.  Avoid caffeine.  Avoid alcohol.  Rest as needed.  Contact a health care provider if:  You have new symptoms.  You cough up pus.  Your cough does not get better after 2-3 weeks, or your cough gets worse.  You cannot control your cough with suppressant medicines and you are losing  sleep.  You develop pain that is getting worse or pain that is not controlled with pain medicines.  You have a fever.  You have unexplained weight loss.  You have night sweats. Get help right away if:  You cough up blood.  You have difficulty breathing.  Your heartbeat is very fast. This information is not intended to replace advice given to you by your health care provider. Make sure you discuss any questions you have with your health care provider. Document Released: 01/06/2011 Document Revised: 12/16/2015 Document Reviewed: 09/16/2014 Elsevier Interactive Patient Education  2017 [employer].    IF you received an x-ray today, you will receive an invoice from [facility]. Please contact [facility] at [phone] with questions or concerns regarding your invoice.   IF you received labwork today, you will receive an invoice from [facility]. Please contact [facility] at [phone] with questions or concerns regarding your invoice.   Our billing staff will not be able to assist you with questions regarding bills from these companies.  You will be contacted with the lab results as soon as they are available. The fastest way to get your results is to activate your My Chart account. Instructions are located on the last page of this paperwork. If you have not heard from [place] regarding the results in 2 weeks, please contact this office.       I personally performed the services described in this documentation, which was scribed in my presence. The recorded information  has been reviewed and considered for accuracy and completeness, addended by me as needed, and agree with information above.  Signed,   Merri Ray, MD [facility].  04/09/17 3:47 PM

## 2017-04-09 NOTE — Patient Instructions (Addendum)
Start azithromycin for possible secondary infection such as bronchitis or early pneumonia. Tessalon Perles as needed 3 times per day for cough. Mucinex over-the-counter is okay unless it makes you wheeze. albuterol inhaler was refilled/printed if you need it. If you do require that medication more than 2 times per day, or persistently needed for more than 2-3 days in a row, please return for recheck.  Return to the clinic or go to the nearest emergency room if any of your symptoms worsen or new symptoms occur.   Acute Bronchitis, Adult Acute bronchitis is sudden (acute) swelling of the air tubes (bronchi) in the lungs. Acute bronchitis causes these tubes to fill with mucus, which can make it hard to breathe. It can also cause coughing or wheezing. In adults, acute bronchitis usually goes away within 2 weeks. A cough caused by bronchitis may last up to 3 weeks. Smoking, allergies, and asthma can make the condition worse. Repeated episodes of bronchitis may cause further lung problems, such as chronic obstructive pulmonary disease (COPD). What are the causes? This condition can be caused by germs and by substances that irritate the lungs, including:  Cold and flu viruses. This condition is most often caused by the same virus that causes a cold.  Bacteria.  Exposure to tobacco smoke, dust, fumes, and air pollution.  What increases the risk? This condition is more likely to develop in people who:  Have close contact with someone with acute bronchitis.  Are exposed to lung irritants, such as tobacco smoke, dust, fumes, and vapors.  Have a weak immune system.  Have a respiratory condition such as asthma.  What are the signs or symptoms? Symptoms of this condition include:  A cough.  Coughing up clear, yellow, or green mucus.  Wheezing.  Chest congestion.  Shortness of breath.  A fever.  Body aches.  Chills.  A sore throat.  How is this diagnosed? This condition is  usually diagnosed with a physical exam. During the exam, your health care provider may order tests, such as chest X-rays, to rule out other conditions. He or she may also:  Test a sample of your mucus for bacterial infection.  Check the level of oxygen in your blood. This is done to check for pneumonia.  Do a chest X-ray or lung function testing to rule out pneumonia and other conditions.  Perform blood tests.  Your health care provider will also ask about your symptoms and medical history. How is this treated? Most cases of acute bronchitis clear up over time without treatment. Your health care provider may recommend:  Drinking more fluids. Drinking more makes your mucus thinner, which may make it easier to breathe.  Taking a medicine for a fever or cough.  Taking an antibiotic medicine.  Using an inhaler to help improve shortness of breath and to control a cough.  Using a cool mist vaporizer or humidifier to make it easier to breathe.  Follow these instructions at home: Medicines  Take over-the-counter and prescription medicines only as told by your health care provider.  If you were prescribed an antibiotic, take it as told by your health care provider. Do not stop taking the antibiotic even if you start to feel better. General instructions  Get plenty of rest.  Drink enough fluids to keep your urine clear or pale yellow.  Avoid smoking and secondhand smoke. Exposure to cigarette smoke or irritating chemicals will make bronchitis worse. If you smoke and you need help quitting, ask your health care  provider. Quitting smoking will help your lungs heal faster.  Use an inhaler, cool mist vaporizer, or humidifier as told by your health care provider.  Keep all follow-up visits as told by your health care provider. This is important. How is this prevented? To lower your risk of getting this condition again:  Wash your hands often with soap and water. If soap and water are not  available, use hand sanitizer.  Avoid contact with people who have cold symptoms.  Try not to touch your hands to your mouth, nose, or eyes.  Make sure to get the flu shot every year.  Contact a health care provider if:  Your symptoms do not improve in 2 weeks of treatment. Get help right away if:  You cough up blood.  You have chest pain.  You have severe shortness of breath.  You become dehydrated.  You faint or keep feeling like you are going to faint.  You keep vomiting.  You have a severe headache.  Your fever or chills gets worse. This information is not intended to replace advice given to you by your health care provider. Make sure you discuss any questions you have with your health care provider. Document Released: 08/17/2004 Document Revised: 02/02/2016 Document Reviewed: 12/29/2015 Elsevier Interactive Patient Education  2017 Elsevier Inc.  Cough, Adult Coughing is a reflex that clears your throat and your airways. Coughing helps to heal and protect your lungs. It is normal to cough occasionally, but a cough that happens with other symptoms or lasts a long time may be a sign of a condition that needs treatment. A cough may last only 2-3 weeks (acute), or it may last longer than 8 weeks (chronic). What are the causes? Coughing is commonly caused by:  Breathing in substances that irritate your lungs.  A viral or bacterial respiratory infection.  Allergies.  Asthma.  Postnasal drip.  Smoking.  Acid backing up from the stomach into the esophagus (gastroesophageal reflux).  Certain medicines.  Chronic lung problems, including COPD (or rarely, lung cancer).  Other medical conditions such as heart failure.  Follow these instructions at home: Pay attention to any changes in your symptoms. Take these actions to help with your discomfort:  Take medicines only as told by your health care provider. ? If you were prescribed an antibiotic medicine, take it as  told by your health care provider. Do not stop taking the antibiotic even if you start to feel better. ? Talk with your health care provider before you take a cough suppressant medicine.  Drink enough fluid to keep your urine clear or pale yellow.  If the air is dry, use a cold steam vaporizer or humidifier in your bedroom or your home to help loosen secretions.  Avoid anything that causes you to cough at work or at home.  If your cough is worse at night, try sleeping in a semi-upright position.  Avoid cigarette smoke. If you smoke, quit smoking. If you need help quitting, ask your health care provider.  Avoid caffeine.  Avoid alcohol.  Rest as needed.  Contact a health care provider if:  You have new symptoms.  You cough up pus.  Your cough does not get better after 2-3 weeks, or your cough gets worse.  You cannot control your cough with suppressant medicines and you are losing sleep.  You develop pain that is getting worse or pain that is not controlled with pain medicines.  You have a fever.  You have unexplained weight  loss.  You have night sweats. Get help right away if:  You cough up blood.  You have difficulty breathing.  Your heartbeat is very fast. This information is not intended to replace advice given to you by your health care provider. Make sure you discuss any questions you have with your health care provider. Document Released: 01/06/2011 Document Revised: 12/16/2015 Document Reviewed: 09/16/2014 Elsevier Interactive Patient Education  2017 [employer].    IF you received an x-ray today, you will receive an invoice from [facility]. Please contact [facility] at [phone] with questions or concerns regarding your invoice.   IF you received labwork today, you will receive an invoice from [facility]. Please contact [facility] at [phone] with questions or concerns regarding your invoice.   Our billing staff will not be able to  assist you with questions regarding bills from these companies.  You will be contacted with the lab results as soon as they are available. The fastest way to get your results is to activate your My Chart account. Instructions are located on the last page of this paperwork. If you have not heard from [place] regarding the results in 2 weeks, please contact this office.

## 2017-04-24 ENCOUNTER — Other Ambulatory Visit: Payer: Self-pay | Admitting: Emergency Medicine

## 2017-04-24 DIAGNOSIS — E785 Hyperlipidemia, unspecified: Secondary | ICD-10-CM

## 2017-04-24 NOTE — Telephone Encounter (Signed)
Refill request for fenofibrate 160 mg denied.  Pt needs office visit and per last refill pt needed to schedule office visit.  Will send to schedulers to call pt and schedule appointment with [name].

## 2017-04-26 ENCOUNTER — Telehealth: Payer: Self-pay | Admitting: Emergency Medicine

## 2017-04-26 NOTE — Telephone Encounter (Signed)
Pt called to ask Dr. Mitchel Honour if he could give extra refills on the [facility] - sensor (the one that goes on his arm). He is curious if he can have the extra refills so he does not have to pay extra co-pays for the meds. [facility] suggested that he try and talk to his Dr. Concerning this.

## 2017-04-30 ENCOUNTER — Other Ambulatory Visit: Payer: Self-pay | Admitting: Emergency Medicine

## 2017-04-30 DIAGNOSIS — E1165 Type 2 diabetes mellitus with hyperglycemia: Secondary | ICD-10-CM

## 2017-04-30 NOTE — Telephone Encounter (Signed)
Please advise 

## 2017-04-30 NOTE — Telephone Encounter (Signed)
No problem. He can have extra refills.

## 2017-05-02 ENCOUNTER — Other Ambulatory Visit: Payer: Self-pay | Admitting: Emergency Medicine

## 2017-05-02 ENCOUNTER — Other Ambulatory Visit: Payer: Self-pay

## 2017-05-02 DIAGNOSIS — E1165 Type 2 diabetes mellitus with hyperglycemia: Secondary | ICD-10-CM

## 2017-05-02 DIAGNOSIS — E785 Hyperlipidemia, unspecified: Secondary | ICD-10-CM

## 2017-05-02 MED ORDER — FREESTYLE LIBRE SENSOR SYSTEM MISC: 1 refills | Status: DC

## 2017-05-02 NOTE — Telephone Encounter (Signed)
Complete

## 2017-05-03 ENCOUNTER — Other Ambulatory Visit: Payer: Self-pay

## 2017-05-03 DIAGNOSIS — E1165 Type 2 diabetes mellitus with hyperglycemia: Secondary | ICD-10-CM

## 2017-05-03 DIAGNOSIS — E785 Hyperlipidemia, unspecified: Secondary | ICD-10-CM

## 2017-05-03 MED ORDER — FENOFIBRATE 160 MG PO TABS: 160.0000 mg | ORAL_TABLET | Freq: Every day | ORAL | 0 refills | Status: DC

## 2017-05-03 MED ORDER — FREESTYLE LIBRE SENSOR SYSTEM MISC: 1 refills | Status: DC

## 2017-05-03 NOTE — Telephone Encounter (Signed)
Pt states Pharmacy:  [facility], [place] didn't get Continuous Blood Gluc Sensor ([place]) [place] [[id number] even though it says Receipt confirmed by pharmacy (05/02/2017 11:14 AM EDT). He would like [place] to resend this script. He would also like a refill on his fenofibrate 160 MG tablet [[id number]. I informed him-Needs office visit for further refills. He says he was just in here. Please advise at  [phone]

## 2017-05-03 NOTE — Telephone Encounter (Signed)
Complete and pt advised.

## 2017-05-08 ENCOUNTER — Other Ambulatory Visit: Payer: Self-pay | Admitting: Emergency Medicine

## 2017-05-08 DIAGNOSIS — E118 Type 2 diabetes mellitus with unspecified complications: Principal | ICD-10-CM

## 2017-05-08 DIAGNOSIS — E1165 Type 2 diabetes mellitus with hyperglycemia: Secondary | ICD-10-CM

## 2017-05-08 DIAGNOSIS — IMO0002 Reserved for concepts with insufficient information to code with codable children: Secondary | ICD-10-CM

## 2017-05-13 ENCOUNTER — Other Ambulatory Visit: Payer: Self-pay | Admitting: Emergency Medicine

## 2017-05-13 DIAGNOSIS — E1165 Type 2 diabetes mellitus with hyperglycemia: Secondary | ICD-10-CM

## 2017-05-13 DIAGNOSIS — IMO0002 Reserved for concepts with insufficient information to code with codable children: Secondary | ICD-10-CM

## 2017-05-13 DIAGNOSIS — E118 Type 2 diabetes mellitus with unspecified complications: Principal | ICD-10-CM

## 2017-05-15 ENCOUNTER — Telehealth: Payer: Self-pay | Admitting: Family Medicine

## 2017-05-15 NOTE — Telephone Encounter (Signed)
Not [place].  Will forward to clinical message pool.

## 2017-05-22 ENCOUNTER — Telehealth: Payer: Self-pay | Admitting: Emergency Medicine

## 2017-05-22 DIAGNOSIS — E118 Type 2 diabetes mellitus with unspecified complications: Principal | ICD-10-CM

## 2017-05-22 DIAGNOSIS — IMO0002 Reserved for concepts with insufficient information to code with codable children: Secondary | ICD-10-CM

## 2017-05-22 DIAGNOSIS — E1165 Type 2 diabetes mellitus with hyperglycemia: Secondary | ICD-10-CM

## 2017-05-22 NOTE — Telephone Encounter (Signed)
PATIENT STATES HIS FARXIGA 5 MG WAS REFUSED. HE SAID HE WAS JUST IN THE OFFICE ON 03/21/17 AND DOES NOT NEED TO COME BACK IN TO BE SEEN AGAIN. HE WOULD LIKE TO GET HIS MEDICATION REFILLS PLEASE. BEST PHONE [phone] (CELL) [place]

## 2017-05-23 MED ORDER — DAPAGLIFLOZIN PROPANEDIOL 5 MG PO TABS: 5.0000 mg | ORAL_TABLET | Freq: Every day | ORAL | 0 refills | Status: DC

## 2017-05-23 NOTE — Telephone Encounter (Signed)
Sent pt mychart message to advise refill completed, call for appt in November as advised 02/2017 appt.

## 2017-05-29 ENCOUNTER — Ambulatory Visit: Payer: 59 | Admitting: Internal Medicine

## 2017-05-29 ENCOUNTER — Encounter: Payer: Self-pay | Admitting: Internal Medicine

## 2017-05-29 VITALS — BP 128/62 | HR 82 | Wt 231.0 lb

## 2017-05-29 DIAGNOSIS — E1165 Type 2 diabetes mellitus with hyperglycemia: Secondary | ICD-10-CM

## 2017-05-29 DIAGNOSIS — E1159 Type 2 diabetes mellitus with other circulatory complications: Secondary | ICD-10-CM

## 2017-05-29 MED ORDER — DULAGLUTIDE 0.75 MG/0.5ML ~~LOC~~ SOAJ: 0.7500 mg | SUBCUTANEOUS | 1 refills | Status: DC

## 2017-05-29 NOTE — Progress Notes (Addendum)
Patient ID: Kamar Callender, male   DOB: August 09, 1955, 61 y.o.   MRN: [id number]   HPI: Benjerman Molinelli is a 61 y.o.-year-old male, referred by his PCP, Dr. Mitchel Honour, for management of DM2, dx in ~2008, non-insulin-dependent, uncontrolled, with long term complications (CAD - s/p CABG in 2007, CKD, PN).  Last hemoglobin A1c was: Lab Results  Component Value Date   HGBA1C 9.5 03/21/2017   HGBA1C 8.8 07/27/2016   HGBA1C 9.3 10/08/2015  He got a steroid inj in shoulder >> 05/10/2017.  Pt is on a regimen of: - Metformin 1000 mg 2x a day, with meals - Glipizide ER 10 mg in am - Invokana >> Farxiga 5 mg in am - he was off the med for 2 mo before last HbA1c. Recently, he again ran out.   Pt checks his sugars frequently with the Libre CGM. He was not checking sugars before he obtained the CGM. We downloaded the reports and we will scan them in patient's chart. Lows (<70): 0% In range (80-180) : 34% Highs (>180): 66%  Ave 204 +/- 57.1.  CBG 25-75%: - am: 130-175 - 2h after b'fast: no data - lunch: no data - 2h after lunch: no data - dinner: 175-320 - 2h after dinner: no data - bedtime: 190-280  No lows. Lowest sugar was 108; ? hypoglycemia awareness.  Highest sugar was 300s  Pt's meals are: - Breakfast: cereal, eggs, sandwich - Lunch: soup or sandwich or leftovers - Dinner: meat, veggies - Snacks: not daily  - + CKD, last BUN/creatinine:  Lab Results  Component Value Date   BUN 15 03/21/2017   BUN 22 11/13/2016   CREATININE 1.09 03/21/2017   CREATININE 1.30 (H) 11/13/2016  On Losartan 50. - last set of lipids: Lab Results  Component Value Date   CHOL 88 (L) 03/21/2017   HDL 22 (L) 03/21/2017   LDLCALC 4 03/21/2017   TRIG 310 (H) 03/21/2017   CHOLHDL 4.0 03/21/2017  On Fenofibrate 160, omega 3 FA 2000 mg 2x a day, Zocor 40. - last eye exam was in 04/16/2017. No DR. Dr. Alois Cliche. - + numbness and tingling in his feet. On ASA  81.  ROS: Constitutional: no weight gain/loss, no fatigue, no subjective hyperthermia/hypothermia, + nocturia Eyes: no blurry vision, no xerophthalmia ENT: no sore throat, no nodules palpated in throat, no dysphagia/odynophagia, no hoarseness Cardiovascular: no CP/SOB/palpitations/leg swelling Respiratory: + cough/no SOB Gastrointestinal: no N/V/+ D/no C Musculoskeletal: + both: muscle/joint aches Skin: + rash Neurological: no tremors/numbness/tingling/dizziness Psychiatric: no depression/anxiety + diff with erections  Past Medical History:  Diagnosis Date  . Coronary artery disease    post CABG x3 in 2007  . Diabetes mellitus   . Hyperlipidemia   . Hypertension   . Obesity    Past Surgical History:  Procedure Laterality Date  . CARDIAC CATHETERIZATION  EF= 45-50%   EF= 45-50% -- Three-vessel coronary artery disease with high-grade lesions in the proximal left anterior descending artery and multiple lesions in the right coronary -- Low normal ejection fraction -- Stable exertional angina -- [name], M.D. [facility]  . CORONARY ARTERY BYPASS GRAFT  02/19/2006     x3 (left internal mammary artery to LAD, left radial artery to posterior descending, saphenous vein graft to first diagonal), endoscopic vein harvest right leg -- SURGEON:  [name]. [name], M.D.  . HERNIA REPAIR     at 61 y/o  . tripple bypass     Social History   Socioeconomic History  . Marital  status: Married    Spouse name: Not on file  . Number of children: 3  Social Needs  Occupational History  . unemployed  Tobacco Use  . Smoking status: Never Smoker  . Smokeless tobacco: Never Used  Substance and Sexual Activity  . Alcohol use: Yes    Alcohol/week:     Types: 1-2 per mo  . Drug use: No  .    Other Topics   .   Social History Narrative  .    Current Outpatient Medications on File Prior to Visit  Medication Sig Dispense Refill  . aspirin 325 MG EC tablet Take 325 mg by mouth daily.     . benzonatate (TESSALON) 100 MG capsule Take 1 capsule (100 mg total) by mouth 3 (three) times daily as needed for cough. 20 capsule 0  . Continuous Blood Gluc Sensor (FREESTYLE LIBRE SENSOR SYSTEM) MISC Use as indicated 3 each 1  . dapagliflozin propanediol (FARXIGA) 5 MG TABS tablet Take 5 mg by mouth daily. 150 mg 0  . fenofibrate 160 MG tablet Take 1 tablet (160 mg total) by mouth daily. Needs office visit for further refills 30 tablet 0  . glipiZIDE (GLIPIZIDE XL) 10 MG 24 hr tablet Take 1 tablet (10 mg total) by mouth daily with breakfast. 90 tablet 3  . losartan (COZAAR) 50 MG tablet Take 1 tablet (50 mg total) by mouth daily. 90 tablet 3  . metFORMIN (GLUCOPHAGE) 1000 MG tablet Take 1 tablet (1,000 mg total) by mouth 2 (two) times daily with a meal. 180 tablet 6  . Omega-3 Fatty Acids (FISH OIL PO) Take 2,000 mg by mouth 2 (two) times daily.      Marland Kitchen OVER THE COUNTER MEDICATION 665 mg.    . POTASSIUM GLUCONATE PO Take 595 mg by mouth.    . pravastatin (PRAVACHOL) 40 MG tablet Take 1 tablet (40 mg total) by mouth daily. 90 tablet 3  . PROLENSA 0.07 % SOLN Place 1 drop into the left eye daily. Reported on 10/08/2015    . sildenafil (VIAGRA) 100 MG tablet Take 1 tablet (100 mg total) by mouth daily as needed for erectile dysfunction. 12 tablet 11  . albuterol (PROVENTIL HFA;VENTOLIN HFA) 108 (90 Base) MCG/ACT inhaler Inhale 1-2 puffs into the lungs every 4 (four) hours as needed for wheezing or shortness of breath. (Patient not taking: Reported on 05/29/2017) 1 Inhaler 0  . atorvastatin (LIPITOR) 40 MG tablet Take 1 tablet (40 mg total) by mouth daily. 90 tablet 3  . promethazine-codeine (PHENERGAN WITH CODEINE) 6.25-10 MG/5ML syrup Take 5 mLs by mouth at bedtime as needed for cough. (Patient not taking: Reported on 05/29/2017) 120 mL 0   No current facility-administered medications on file prior to visit.    No Known Allergies Family History  Problem Relation Age of Onset  . Heart disease  Mother   . Cancer Father     PE: BP 128/62 (BP Location: Left Arm, Patient Position: Sitting)   Pulse 82   Wt 231 lb (104.8 kg)   SpO2 95%   BMI 33.15 kg/m  Wt Readings from Last 3 Encounters:  05/29/17 231 lb (104.8 kg)  04/09/17 233 lb (105.7 kg)  03/21/17 233 lb 6.4 oz (105.9 kg)   Constitutional: overweight, in NAD Eyes: PERRLA, EOMI, no exophthalmos ENT: moist mucous membranes, no thyromegaly, no cervical lymphadenopathy Cardiovascular: RRR, No MRG Respiratory: CTA B Gastrointestinal: abdomen soft, NT, ND, BS+ Musculoskeletal: no deformities, strength intact in all 4 Skin: moist,  warm, no rashes Neurological: no tremor with outstretched hands, DTR normal in all 4  ASSESSMENT: 1. DM2, non-insulin-dependent, uncontrolled, without complications - CAD - s/p CABG in 2007 - CKD - PN  PLAN:  1. Patient with long-standing, uncontrolled diabetes, on oral antidiabetic regimen, which became insufficient.  We reviewed his CGM traces, after we downloaded the reports, along with the patient.  It appears that his sugars increase after every meal and they slowly come down during the night.  Glipizide and Wilder Glade should help with postprandial hyperglycemia, but these are apparently not enough.  We discussed about starting a GLP-1 receptor agonist, and I suggested Trulicity, which I hope would be covered by his insurance.  We will start with a low dose and advanced to the higher dose tolerated.  I demonstrated pen use and explained the mechanism of action and Trulicity benefits and possible side effects. - I suggested to:  Patient Instructions  Please continue: - Metformin 1000 mg 2x a day, with meals - Glipizide ER 10 mg in am - Farxiga 5 mg in am  Please start Trulicity 9.48 mg weekly. Let me know when you are close to running out to call in the higher dose to your pharmacy (1.5 mg).  Please stop at the lab.  Please return in 1.5-2 months with your sugar log.   - Strongly advised  him to start checking sugars at different times of the day - check 1-2 times a day, rotating checks - given sugar log and advised how to fill it and to bring it at next appt  - given foot care handout and explained the principles  - given instructions for hypoglycemia management "15-15 rule"  - advised for yearly eye exams  - Return to clinic in 1.5 mo with sugar log   Component     Latest Ref Rng & Units 05/29/2017  Glucose     65 - 99 mg/dL 214 (H)  BUN     7 - 25 mg/dL 25  Creatinine     0.70 - 1.25 mg/dL 1.48 (H)  GFR, Est Non African American     > OR = 60 mL/min/1.31m2 51 (L)  GFR, Est African American     > OR = 60 mL/min/1.19m2 59 (L)  BUN/Creatinine Ratio     6 - 22 (calc) 17  Sodium     135 - 146 mmol/L 137  Potassium     3.5 - 5.3 mmol/L 5.1  Chloride     98 - 110 mmol/L 102  CO2     20 - 32 mmol/L 26  Calcium     8.6 - 10.3 mg/dL 9.8   GFR higher. Potassium normal. Recommend hydration and will recheck GFR at next visit.  Philemon Kingdom, MD PhD [facility] Endocrinology

## 2017-05-29 NOTE — Patient Instructions (Addendum)
Please continue: - Metformin 1000 mg 2x a day, with meals - Glipizide ER 10 mg in am - Farxiga 5 mg in am  Please start Trulicity 4.48 mg weekly. Let me know when you are close to running out to call in the higher dose to your pharmacy (1.5 mg).  Please stop at the lab.  Please return in 1.5-2 months with your sugar log.   PATIENT INSTRUCTIONS FOR TYPE 2 DIABETES:  **Please join MyChart!** - see attached instructions about how to join if you have not done so already.  DIET AND EXERCISE Diet and exercise is an important part of diabetic treatment.  We recommended aerobic exercise in the form of brisk walking (working between 40-60% of maximal aerobic capacity, similar to brisk walking) for 150 minutes per week (such as 30 minutes five days per week) along with 3 times per week performing 'resistance' training (using various gauge rubber tubes with handles) 5-10 exercises involving the major muscle groups (upper body, lower body and core) performing 10-15 repetitions (or near fatigue) each exercise. Start at half the above goal but build slowly to reach the above goals. If limited by weight, joint pain, or disability, we recommend daily walking in a swimming pool with water up to waist to reduce pressure from joints while allow for adequate exercise.    BLOOD GLUCOSES Monitoring your blood glucoses is important for continued management of your diabetes. Please check your blood glucoses 2-4 times a day: fasting, before meals and at bedtime (you can rotate these measurements - e.g. one day check before the 3 meals, the next day check before 2 of the meals and before bedtime, etc.).   HYPOGLYCEMIA (low blood sugar) Hypoglycemia is usually a reaction to not eating, exercising, or taking too much insulin/ other diabetes drugs.  Symptoms include tremors, sweating, hunger, confusion, headache, etc. Treat IMMEDIATELY with 15 grams of Carbs: . 4 glucose tablets .  cup regular juice/soda . 2  tablespoons raisins . 4 teaspoons sugar . 1 tablespoon honey Recheck blood glucose in 15 mins and repeat above if still symptomatic/blood glucose <100.  RECOMMENDATIONS TO REDUCE YOUR RISK OF DIABETIC COMPLICATIONS: * Take your prescribed MEDICATION(S) * Follow a DIABETIC diet: Complex carbs, fiber rich foods, (monounsaturated and polyunsaturated) fats * AVOID saturated/trans fats, high fat foods, >2,300 mg salt per day. * EXERCISE at least 5 times a week for 30 minutes or preferably daily.  * DO NOT SMOKE OR DRINK more than 1 drink a day. * Check your FEET every day. Do not wear tightfitting shoes. Contact us if you develop an ulcer * See your EYE doctor once a year or more if needed * Get a FLU shot once a year * Get a PNEUMONIA vaccine once before and once after age 74 years  GOALS:  * Your Hemoglobin A1c of <7%  * fasting sugars need to be <130 * after meals sugars need to be <180 (2h after you start eating) * Your Systolic BP should be 185 or lower  * Your Diastolic BP should be 80 or lower  * Your HDL (Good Cholesterol) should be 40 or higher  * Your LDL (Bad Cholesterol) should be 100 or lower. * Your Triglycerides should be 150 or lower  * Your Urine microalbumin (kidney function) should be <30 * Your Body Mass Index should be 25 or lower    Please consider the following ways to cut down carbs and fat and increase fiber and micronutrients in your diet: -  substitute whole grain for white bread or pasta - substitute brown rice for white rice - substitute 90-calorie flat bread pieces for slices of bread when possible - substitute sweet potatoes or yams for white potatoes - substitute humus for margarine - substitute tofu for cheese when possible - substitute almond or rice milk for regular milk (would not drink soy milk daily due to concern for soy estrogen influence on breast cancer risk) - substitute dark chocolate for other sweets when possible - substitute water - can  add lemon or orange slices for taste - for diet sodas (artificial sweeteners will trick your body that you can eat sweets without getting calories and will lead you to overeating and weight gain in the long run) - do not skip breakfast or other meals (this will slow down the metabolism and will result in more weight gain over time)  - can try smoothies made from fruit and almond/rice milk in am instead of regular breakfast - can also try old-fashioned (not instant) oatmeal made with almond/rice milk in am - order the dressing on the side when eating salad at a restaurant (pour less than half of the dressing on the salad) - eat as little meat as possible - can try juicing, but should not forget that juicing will get rid of the fiber, so would alternate with eating raw veg./fruits or drinking smoothies - use as little oil as possible, even when using olive oil - can dress a salad with a mix of balsamic vinegar and lemon juice, for e.g. - use agave nectar, stevia sugar, or regular sugar rather than artificial sweateners - steam or broil/roast veggies  - snack on veggies/fruit/nuts (unsalted, preferably) when possible, rather than processed foods - reduce or eliminate aspartame in diet (it is in diet sodas, chewing gum, etc) Read the labels!  Try to read Dr. Janene Harvey book: "Program for Reversing Diabetes" for other ideas for healthy eating.

## 2017-05-30 ENCOUNTER — Other Ambulatory Visit: Payer: Self-pay | Admitting: *Deleted

## 2017-05-30 ENCOUNTER — Telehealth: Payer: Self-pay | Admitting: Interventional Cardiology

## 2017-05-30 LAB — BASIC METABOLIC PANEL WITH GFR
BUN/Creatinine Ratio: 17 (calc) (ref 6–22)
BUN: 25 mg/dL (ref 7–25)
CO2: 26 mmol/L (ref 20–32)
Calcium: 9.8 mg/dL (ref 8.6–10.3)
Chloride: 102 mmol/L (ref 98–110)
Creat: 1.48 mg/dL — ABNORMAL HIGH (ref 0.70–1.25)
GFR, Est African American: 59 mL/min/{1.73_m2} — ABNORMAL LOW (ref >=60)
GFR, Est Non African American: 51 mL/min/{1.73_m2} — ABNORMAL LOW (ref >=60)
Glucose, Bld: 214 mg/dL — ABNORMAL HIGH (ref 65–99)
Potassium: 5.1 mmol/L (ref 3.5–5.3)
Sodium: 137 mmol/L (ref 135–146)

## 2017-05-30 MED ORDER — ATORVASTATIN CALCIUM 40 MG PO TABS: 40.0000 mg | ORAL_TABLET | Freq: Every day | ORAL | 1 refills | Status: DC

## 2017-05-30 NOTE — Telephone Encounter (Signed)
°*  STAT* If patient is at the pharmacy, call can be transferred to refill team.   1. Which medications need to be refilled? (please list name of each medication and dose if known) Atorvastatin 40 mg   2. Which pharmacy/location (including street and city if local pharmacy) is medication to be sent to?[facility] [place] [place]  3. Do they need a 30 day or 90 day supply?[place]

## 2017-05-31 ENCOUNTER — Other Ambulatory Visit: Payer: Self-pay

## 2017-05-31 ENCOUNTER — Encounter: Payer: Self-pay | Admitting: Internal Medicine

## 2017-05-31 DIAGNOSIS — E118 Type 2 diabetes mellitus with unspecified complications: Principal | ICD-10-CM

## 2017-05-31 DIAGNOSIS — E1165 Type 2 diabetes mellitus with hyperglycemia: Secondary | ICD-10-CM

## 2017-05-31 DIAGNOSIS — IMO0002 Reserved for concepts with insufficient information to code with codable children: Secondary | ICD-10-CM

## 2017-06-05 ENCOUNTER — Other Ambulatory Visit: Payer: Self-pay | Admitting: Emergency Medicine

## 2017-06-05 DIAGNOSIS — E785 Hyperlipidemia, unspecified: Secondary | ICD-10-CM

## 2017-06-11 ENCOUNTER — Other Ambulatory Visit: Payer: Self-pay | Admitting: Emergency Medicine

## 2017-06-11 DIAGNOSIS — E785 Hyperlipidemia, unspecified: Secondary | ICD-10-CM

## 2017-06-11 NOTE — Telephone Encounter (Signed)
Copied from [place] [id number]. Topic: Quick Communication - See Telephone Encounter >> Jun 11, 2017 10:19 AM Ahmed Prima L wrote: CRM for notification. See Telephone encounter for:   Patient said he has requested a refill once last week for FENOFIBRATE. He said the pharmacy does not have the request. Please Send to [facility] on [address] 06/11/17.

## 2017-06-11 NOTE — Telephone Encounter (Signed)
Needs appt

## 2017-06-12 ENCOUNTER — Other Ambulatory Visit: Payer: Self-pay

## 2017-06-12 ENCOUNTER — Telehealth: Payer: Self-pay | Admitting: Emergency Medicine

## 2017-06-12 DIAGNOSIS — E785 Hyperlipidemia, unspecified: Secondary | ICD-10-CM

## 2017-06-12 MED ORDER — FENOFIBRATE 160 MG PO TABS: 160.0000 mg | ORAL_TABLET | Freq: Every day | ORAL | 0 refills | Status: DC

## 2017-06-12 NOTE — Telephone Encounter (Signed)
Sent in month supply and pt will need to have an OV before month runs out

## 2017-06-12 NOTE — Telephone Encounter (Signed)
PC from pt.  Asking for refill on Fenofibrate, but was advised that he needed to schedule an office visit, prior to receiving refill.  Stated he he was just seen in August, by Dr. Mitchel Honour.  Voiced frustration about being out of the Fenofibrate, and not having an understanding of why an office visit is needed.  Advised that a refill with a 15 day supply and an office visit can be scheduled.  Asked if Dr. Mitchel Honour is going to do any blood work, and stated "I don't see the point of the appt. if he isn't rechecking labs."   Also stated that he has to pay the same copay if he gets a 15 or 30 day supply.  Offered to check with MD re: need for repeat lab work, reason for 3 mo. f/u, and if approved for refill with 30 day supply of Fenofibrate.  Pt. agreed with plan.

## 2017-06-19 NOTE — Telephone Encounter (Signed)
Pt said someone was suppose to call him back and let him know why an appt was necessary. He did pick up the script for the month supply. Please call him back @ [phone]. If he does need to come in, he will.

## 2017-06-19 NOTE — Telephone Encounter (Signed)
Copied from [place] [id number]. Topic: Quick Communication - See Telephone Encounter >> Jun 19, 2017  5:54 PM Vernona Rieger wrote: CRM for notification. See Telephone encounter for:  Patient would like to know between the PRAVASTATIN 40 mg & ATORVASTATIN 40 mg, which one he should be taking because the pharmacist said he should not be taking both and they will not refill both. Call back @ [phone]. 06/19/17.

## 2017-06-20 ENCOUNTER — Telehealth: Payer: Self-pay

## 2017-06-20 ENCOUNTER — Encounter: Payer: Self-pay | Admitting: Internal Medicine

## 2017-06-20 ENCOUNTER — Encounter: Payer: Self-pay | Admitting: Interventional Cardiology

## 2017-06-20 MED ORDER — DAPAGLIFLOZIN PROPANEDIOL 5 MG PO TABS: 5.0000 mg | ORAL_TABLET | Freq: Every day | ORAL | 0 refills | Status: DC

## 2017-06-20 NOTE — Telephone Encounter (Signed)
Received call from [place] from Dr. Barry Brunner office. Made him aware of the information below. He states that he will let Dr. Mitchel Honour know.

## 2017-06-20 NOTE — Telephone Encounter (Signed)
Called pt who said he just received a call for the office telling him to take the Pravastatin instead of the Atorvastatin. Relayed message that Dr. Mitchel Honour recommended. Pt has OV tomorrow am .

## 2017-06-20 NOTE — Telephone Encounter (Signed)
Advised pt to schedule OV for this month for follow-up on cholesterol.

## 2017-06-20 NOTE — Addendum Note (Signed)
Addended by: Gari Crown D on: 06/20/2017 04:29 PM   Modules accepted: Orders

## 2017-06-20 NOTE — Telephone Encounter (Signed)
See phone note from today 11/28

## 2017-06-20 NOTE — Telephone Encounter (Signed)
Called patient in regards to his MyChart Message received below. Patient states that he is not sure which statin medicine he is suppose to be taking. Patient states that his pharmacy told him that they cannot refill both and advised him to reach out to his provider for instruction. Patient takes fenofibrate 160 mg QD, fish oil 2 g BID, and when we saw the patient last 08/18/16 the patient was instructed to stop simvastatin and start atorvastatin 40 mg QD. Patient states that he has been taking his fenofibrate, fish oil, and atorvastatin since then. Patient states that when he had his lipids checked last on 03/21/17 with his PCP Dr. Mitchel Honour, he was instructed to start pravastatin 40 mg QD. Patient states that he was not instructed to stop his atorvastatin and states that they are both still on his med list. Patient states that he has been taking BOTH atorvastatin 40 mg QD and pravastatin 40 mg QD since 03/21/17. Patient states that he reached out to his PCP's office today and spoke to 2 different people who he states gave him conflicting information. I made the patient aware that he should not be taking 2 statins at the same time. Patient states that he has not had any muscle pain or an increased amount of joint pain. Patient states that he was scheduled an appointment with Dr. Mitchel Honour tomorrow 06/21/17 at 9:40 AM. Patient states that he plans to go fasting to this appointment in hopes to have his cholesterol checked. Attempted to call Dr. Barry Brunner office to speak with them regarding the patient's appointment tomorrow. I was told that no one was available to speak to me, but a message would be left for them to call back when they had the chance. My goal was to ensure that the patient would have Fasting LIPIDS, LFTs, and a Direct LDL drawn at his appointment tomorrow since the patient has been on 2 statins since the end of August and since his TGs are elevated a calculated LDL will not be accurate (per [facility]). I will  forward this information to Dr. Irish Lack and Dr. Mitchel Honour to make them aware and maybe determine who will be managing the patient's cholesterol.

## 2017-06-20 NOTE — Telephone Encounter (Signed)
Looks like the cardiologist put him on the atorvastatin. What do you recommend?

## 2017-06-20 NOTE — Telephone Encounter (Signed)
Stick to the Atorvastatin then.

## 2017-06-20 NOTE — Telephone Encounter (Signed)
Patient need refill of forsiga 5 mg, [facility] [address], [phone]

## 2017-06-20 NOTE — Telephone Encounter (Signed)
[name] Love, [name] "ANDY"  to [name], MD      06/20/17 11:38 AM  I seem have gotten a prescription for Avoristatin( I think) from you and pravastatin from Dr Janett Labella. I have an appointment with him tomorrow, can you get with him and decide which I should continue taking. If it's the one you ptescribed please send a new prescription to [facility] [address].  Thanks

## 2017-06-21 ENCOUNTER — Encounter: Payer: Self-pay | Admitting: Emergency Medicine

## 2017-06-21 ENCOUNTER — Other Ambulatory Visit: Payer: Self-pay

## 2017-06-21 ENCOUNTER — Ambulatory Visit: Payer: 59 | Admitting: Emergency Medicine

## 2017-06-21 VITALS — BP 116/64 | HR 78 | Temp 98.0°F | Resp 16 | Ht 70.0 in | Wt 229.2 lb

## 2017-06-21 DIAGNOSIS — E785 Hyperlipidemia, unspecified: Secondary | ICD-10-CM | POA: Diagnosis not present

## 2017-06-21 DIAGNOSIS — E1165 Type 2 diabetes mellitus with hyperglycemia: Secondary | ICD-10-CM | POA: Diagnosis not present

## 2017-06-21 DIAGNOSIS — I10 Essential (primary) hypertension: Secondary | ICD-10-CM | POA: Diagnosis not present

## 2017-06-21 DIAGNOSIS — E781 Pure hyperglyceridemia: Secondary | ICD-10-CM

## 2017-06-21 DIAGNOSIS — Z23 Encounter for immunization: Secondary | ICD-10-CM | POA: Diagnosis not present

## 2017-06-21 LAB — POCT GLYCOSYLATED HEMOGLOBIN (HGB A1C): Hemoglobin A1C: 8.2

## 2017-06-21 NOTE — Patient Instructions (Addendum)
IF you received an x-ray today, you will receive an invoice from [facility]. Please contact [facility] at [phone] with questions or concerns regarding your invoice.   IF you received labwork today, you will receive an invoice from [facility]. Please contact [facility] at [phone] with questions or concerns regarding your invoice.   Our billing staff will not be able to assist you with questions regarding bills from these companies.  You will be contacted with the lab results as soon as they are available. The fastest way to get your results is to activate your My Chart account. Instructions are located on the last page of this paperwork. If you have not heard from [place] regarding the results in 2 weeks, please contact this office.    Diabetes Mellitus and Food It is important for you to manage your blood sugar (glucose) level. Your blood glucose level can be greatly affected by what you eat. Eating healthier foods in the appropriate amounts throughout the day at about the same time each day will help you control your blood glucose level. It can also help slow or prevent worsening of your diabetes mellitus. Healthy eating may even help you improve the level of your blood pressure and reach or maintain a healthy weight. General recommendations for healthful eating and cooking habits include:  Eating meals and snacks regularly. Avoid going long periods of time without eating to lose weight.  Eating a diet that consists mainly of plant-based foods, such as fruits, vegetables, nuts, legumes, and whole grains.  Using low-heat cooking methods, such as baking, instead of high-heat cooking methods, such as deep frying.  Work with your dietitian to make sure you understand how to use the Nutrition Facts information on food labels. How can food affect me? Carbohydrates Carbohydrates affect your blood glucose level more than any other type of food. Your dietitian will help you  determine how many carbohydrates to eat at each meal and teach you how to count carbohydrates. Counting carbohydrates is important to keep your blood glucose at a healthy level, especially if you are using insulin or taking certain medicines for diabetes mellitus. Alcohol Alcohol can cause sudden decreases in blood glucose (hypoglycemia), especially if you use insulin or take certain medicines for diabetes mellitus. Hypoglycemia can be a life-threatening condition. Symptoms of hypoglycemia (sleepiness, dizziness, and disorientation) are similar to symptoms of having too much alcohol. If your health care provider has given you approval to drink alcohol, do so in moderation and use the following guidelines:  Women should not have more than one drink per day, and men should not have more than two drinks per day. One drink is equal to: ? 12 oz of beer. ? 5 oz of wine. ? 1 oz of hard liquor.  Do not drink on an empty stomach.  Keep yourself hydrated. Have water, diet soda, or unsweetened iced tea.  Regular soda, juice, and other mixers might contain a lot of carbohydrates and should be counted.  What foods are not recommended? As you make food choices, it is important to remember that all foods are not the same. Some foods have fewer nutrients per serving than other foods, even though they might have the same number of calories or carbohydrates. It is difficult to get your body what it needs when you eat foods with fewer nutrients. Examples of foods that you should avoid that are high in calories and carbohydrates but low in nutrients include:  Trans fats (most processed foods  list trans fats on the Nutrition Facts label).  Regular soda.  Juice.  Candy.  Sweets, such as cake, pie, doughnuts, and cookies.  Fried foods.  What foods can I eat? Eat nutrient-rich foods, which will nourish your body and keep you healthy. The food you should eat also will depend on several factors,  including:  The calories you need.  The medicines you take.  Your weight.  Your blood glucose level.  Your blood pressure level.  Your cholesterol level.  You should eat a variety of foods, including:  Protein. ? Lean cuts of meat. ? Proteins low in saturated fats, such as fish, egg whites, and beans. Avoid processed meats.  Fruits and vegetables. ? Fruits and vegetables that may help control blood glucose levels, such as apples, mangoes, and yams.  Dairy products. ? Choose fat-free or low-fat dairy products, such as milk, yogurt, and cheese.  Grains, bread, pasta, and rice. ? Choose whole grain products, such as multigrain bread, whole oats, and brown rice. These foods may help control blood pressure.  Fats. ? Foods containing healthful fats, such as nuts, avocado, olive oil, canola oil, and fish.  Does everyone with diabetes mellitus have the same meal plan? Because every person with diabetes mellitus is different, there is not one meal plan that works for everyone. It is very important that you meet with a dietitian who will help you create a meal plan that is just right for you. This information is not intended to replace advice given to you by your health care provider. Make sure you discuss any questions you have with your health care provider. Document Released: 04/06/2005 Document Revised: 12/16/2015 Document Reviewed: 06/06/2013 Elsevier Interactive Patient Education  2017 [employer].

## 2017-06-21 NOTE — Progress Notes (Signed)
[name] Love 61 y.o.   Chief Complaint  Patient presents with  . Follow-up    with labwork    HISTORY OF PRESENT ILLNESS: This is a 61 y.o. male diabetic on multiple meds; has been taking 2 different statins; needs lab work; has no complaints and is feeling well.  HPI   Prior to Admission medications   Medication Sig Start Date End Date Taking? Authorizing Provider  albuterol (PROVENTIL HFA;VENTOLIN HFA) 108 (90 Base) MCG/ACT inhaler Inhale 1-2 puffs into the lungs every 4 (four) hours as needed for wheezing or shortness of breath. 04/09/17  Yes [name], MD  aspirin 325 MG EC tablet Take 325 mg by mouth daily.   Yes [provider]  atorvastatin (LIPITOR) 40 MG tablet Take 1 tablet (40 mg total) daily by mouth. 05/30/17 08/28/17 Yes [name], MD  dapagliflozin propanediol (FARXIGA) 5 MG TABS tablet Take 5 mg by mouth daily. 06/20/17 09/18/17 Yes Philemon Kingdom, MD  Dulaglutide (TRULICITY) 3.87 FI/4.3PI SOPN Inject 0.75 mg once a week into the skin. 05/29/17  Yes Philemon Kingdom, MD  fenofibrate 160 MG tablet Take 1 tablet (160 mg total) by mouth daily. Needs office visit for further refills 06/12/17  Yes Burtis Imhoff, [name], MD  glipiZIDE (GLIPIZIDE XL) 10 MG 24 hr tablet Take 1 tablet (10 mg total) by mouth daily with breakfast. 03/21/17  Yes Pierina Schuknecht, [name], MD  losartan (COZAAR) 50 MG tablet Take 1 tablet (50 mg total) by mouth daily. 03/21/17  Yes [name], MD  metFORMIN (GLUCOPHAGE) 1000 MG tablet Take 1 tablet (1,000 mg total) by mouth 2 (two) times daily with a meal. 03/21/17  Yes Rashi Granier, [name], MD  Omega-3 Fatty Acids (FISH OIL PO) Take 2,000 mg by mouth 2 (two) times daily.     Yes [provider]  OVER THE COUNTER MEDICATION 665 mg.   Yes [provider]  POTASSIUM GLUCONATE PO Take 595 mg by mouth.   Yes [provider]  sildenafil (VIAGRA) 100 MG tablet Take 1 tablet (100 mg total)  by mouth daily as needed for erectile dysfunction. 08/31/16  Yes Yunus Stoklosa, [name], MD  Continuous Blood Gluc Sensor ([place]) MISC Use as indicated 05/03/17   [name], MD  pravastatin (PRAVACHOL) 40 MG tablet Take 1 tablet (40 mg total) by mouth daily. Patient not taking: Reported on 06/21/2017 03/21/17   [name], MD  PROLENSA 0.07 % SOLN Place 1 drop into the left eye daily. Reported on 10/08/2015 12/18/14   [provider]    No Known Allergies  Patient Active Problem List   Diagnosis Date Noted  . Acute upper respiratory infection 03/21/2017  . Need for prophylactic vaccination and inoculation against influenza 03/21/2017  . Erectile dysfunction 09/15/2013  . Nonspecific abnormal electrocardiogram (ECG) (EKG) 06/10/2013  . Poorly controlled type 2 diabetes mellitus with circulatory disorder ([place]) 09/12/2012  . Coronary artery disease 07/14/2011  . History of coronary artery bypass surgery 07/14/2011  . High blood triglycerides 02/23/2009  . Obesity 02/23/2009  . Essential hypertension 02/23/2009    Past Medical History:  Diagnosis Date  . Coronary artery disease    post CABG x3 in 2007  . Diabetes mellitus   . Hyperlipidemia   . Hypertension   . Obesity     Past Surgical History:  Procedure Laterality Date  . CARDIAC CATHETERIZATION  EF= 45-50%   EF= 45-50% -- Three-vessel coronary artery disease with high-grade lesions  in the proximal left anterior descending artery and multiple lesions in the right coronary -- Low normal ejection fraction -- Stable exertional angina -- [name], M.D. [facility]  . CORONARY ARTERY BYPASS GRAFT  02/19/2006     x3 (left internal mammary artery to LAD, left radial artery to posterior descending, saphenous vein graft to first diagonal), endoscopic vein harvest right leg -- SURGEON:  [name]. [name], M.D.  . HERNIA REPAIR     at 61 y/o  . tripple bypass      Social History    Socioeconomic History  . Marital status: Married    Spouse name: Not on file  . Number of children: Not on file  . Years of education: Not on file  . Highest education level: Not on file  Social Needs  . Financial resource strain: Not on file  . Food insecurity - worry: Not on file  . Food insecurity - inability: Not on file  . Transportation needs - medical: Not on file  . Transportation needs - non-medical: Not on file  Occupational History  . Not on file  Tobacco Use  . Smoking status: Never Smoker  . Smokeless tobacco: Never Used  Substance and Sexual Activity  . Alcohol use: Yes    Alcohol/week: 0.6 oz    Types: 1 Standard drinks or equivalent per week  . Drug use: No  . Sexual activity: Yes  Other Topics Concern  . Not on file  Social History Narrative  . Not on file    Family History  Problem Relation Age of Onset  . Heart disease Mother   . Cancer Father      Review of Systems  Constitutional: Negative.  Negative for chills and fever.  HENT: Negative.   Eyes: Negative.   Respiratory: Negative.  Negative for cough and shortness of breath.   Cardiovascular: Negative.  Negative for chest pain, palpitations, claudication and leg swelling.  Gastrointestinal: Negative.  Negative for abdominal pain, blood in stool, diarrhea, nausea and vomiting.  Genitourinary: Negative.  Negative for dysuria and hematuria.  Musculoskeletal: Negative for back pain, joint pain, myalgias and neck pain.  Skin: Negative.  Negative for rash.  Neurological: Negative.  Negative for dizziness and headaches.  Endo/Heme/Allergies: Negative.   Psychiatric/Behavioral: Negative.   All other systems reviewed and are negative.  Vitals:   06/21/17 1000  BP: 116/64  Pulse: 78  Resp: 16  Temp: 98 F (36.7 C)  SpO2: 98%   Results for orders placed or performed in visit on 06/21/17 (from the past 24 hour(s))  POCT glycosylated hemoglobin (Hb A1C)     Status: None   Collection Time:  06/21/17 10:29 AM  Result Value Ref Range   Hemoglobin A1C 8.2    A1C 8/29:  9.5  Physical Exam  Constitutional: He is oriented to person, place, and time. He appears well-developed and well-nourished.  HENT:  Head: Normocephalic and atraumatic.  Right Ear: External ear normal.  Left Ear: External ear normal.  Nose: Nose normal.  Mouth/Throat: Oropharynx is clear and moist.  Eyes: Conjunctivae and EOM are normal. Pupils are equal, round, and reactive to light.  Neck: Normal range of motion. Neck supple. No JVD present. No thyromegaly present.  Cardiovascular: Normal rate, regular rhythm, normal heart sounds and intact distal pulses.  Pulmonary/Chest: Effort normal and breath sounds normal.  Abdominal: Soft. Bowel sounds are normal. He exhibits no distension. There is no tenderness.  Musculoskeletal: Normal range of motion.  Lymphadenopathy:  He has no cervical adenopathy.  Neurological: He is alert and oriented to person, place, and time. No sensory deficit. He exhibits normal muscle tone.  Skin: Skin is warm and dry. Capillary refill takes less than 2 seconds. No rash noted.  Psychiatric: He has a normal mood and affect. His behavior is normal.  Vitals reviewed.  A total of 25 minutes was spent in the room with the patient, greater than 50% of which was in counseling/coordination of care regarding chronic medical problems including use of statins.   ASSESSMENT & PLAN: Take only Lipitor as prescribed by your Cardiologist.  [place] was seen today for follow-up.  Diagnoses and all orders for this visit:  Type 2 diabetes mellitus with hyperglycemia, without long-term current use of insulin (HCC) Comments: A1C improved; continue present meds and f/u with your Endocrinologist. Orders: -     POCT glycosylated hemoglobin (Hb A1C) -     CBC with Differential/Platelet  High blood triglycerides -     LDL Cholesterol, Direct -     Comprehensive metabolic panel -     Lipid  panel  Need for Tdap vaccination -     Tdap vaccine greater than or equal to 7yo IM  Dyslipidemia  Essential hypertension    Patient Instructions       IF you received an x-ray today, you will receive an invoice from [facility]. Please contact [facility] at [phone] with questions or concerns regarding your invoice.   IF you received labwork today, you will receive an invoice from [facility]. Please contact [facility] at [phone] with questions or concerns regarding your invoice.   Our billing staff will not be able to assist you with questions regarding bills from these companies.  You will be contacted with the lab results as soon as they are available. The fastest way to get your results is to activate your My Chart account. Instructions are located on the last page of this paperwork. If you have not heard from [place] regarding the results in 2 weeks, please contact this office.    Diabetes Mellitus and Food It is important for you to manage your blood sugar (glucose) level. Your blood glucose level can be greatly affected by what you eat. Eating healthier foods in the appropriate amounts throughout the day at about the same time each day will help you control your blood glucose level. It can also help slow or prevent worsening of your diabetes mellitus. Healthy eating may even help you improve the level of your blood pressure and reach or maintain a healthy weight. General recommendations for healthful eating and cooking habits include:  Eating meals and snacks regularly. Avoid going long periods of time without eating to lose weight.  Eating a diet that consists mainly of plant-based foods, such as fruits, vegetables, nuts, legumes, and whole grains.  Using low-heat cooking methods, such as baking, instead of high-heat cooking methods, such as deep frying.  Work with your dietitian to make sure you understand how to use the Nutrition Facts information on  food labels. How can food affect me? Carbohydrates Carbohydrates affect your blood glucose level more than any other type of food. Your dietitian will help you determine how many carbohydrates to eat at each meal and teach you how to count carbohydrates. Counting carbohydrates is important to keep your blood glucose at a healthy level, especially if you are using insulin or taking certain medicines for diabetes mellitus. Alcohol Alcohol can cause sudden decreases in blood glucose (hypoglycemia),  especially if you use insulin or take certain medicines for diabetes mellitus. Hypoglycemia can be a life-threatening condition. Symptoms of hypoglycemia (sleepiness, dizziness, and disorientation) are similar to symptoms of having too much alcohol. If your health care provider has given you approval to drink alcohol, do so in moderation and use the following guidelines:  Women should not have more than one drink per day, and men should not have more than two drinks per day. One drink is equal to: ? 12 oz of beer. ? 5 oz of wine. ? 1 oz of hard liquor.  Do not drink on an empty stomach.  Keep yourself hydrated. Have water, diet soda, or unsweetened iced tea.  Regular soda, juice, and other mixers might contain a lot of carbohydrates and should be counted.  What foods are not recommended? As you make food choices, it is important to remember that all foods are not the same. Some foods have fewer nutrients per serving than other foods, even though they might have the same number of calories or carbohydrates. It is difficult to get your body what it needs when you eat foods with fewer nutrients. Examples of foods that you should avoid that are high in calories and carbohydrates but low in nutrients include:  Trans fats (most processed foods list trans fats on the Nutrition Facts label).  Regular soda.  Juice.  Candy.  Sweets, such as cake, pie, doughnuts, and cookies.  Fried foods.  What foods  can I eat? Eat nutrient-rich foods, which will nourish your body and keep you healthy. The food you should eat also will depend on several factors, including:  The calories you need.  The medicines you take.  Your weight.  Your blood glucose level.  Your blood pressure level.  Your cholesterol level.  You should eat a variety of foods, including:  Protein. ? Lean cuts of meat. ? Proteins low in saturated fats, such as fish, egg whites, and beans. Avoid processed meats.  Fruits and vegetables. ? Fruits and vegetables that may help control blood glucose levels, such as apples, mangoes, and yams.  Dairy products. ? Choose fat-free or low-fat dairy products, such as milk, yogurt, and cheese.  Grains, bread, pasta, and rice. ? Choose whole grain products, such as multigrain bread, whole oats, and brown rice. These foods may help control blood pressure.  Fats. ? Foods containing healthful fats, such as nuts, avocado, olive oil, canola oil, and fish.  Does everyone with diabetes mellitus have the same meal plan? Because every person with diabetes mellitus is different, there is not one meal plan that works for everyone. It is very important that you meet with a dietitian who will help you create a meal plan that is just right for you. This information is not intended to replace advice given to you by your health care provider. Make sure you discuss any questions you have with your health care provider. Document Released: 04/06/2005 Document Revised: 12/16/2015 Document Reviewed: 06/06/2013 Elsevier Interactive Patient Education  2017 [employer].     Agustina Caroli, MD [facility]

## 2017-06-22 ENCOUNTER — Encounter: Payer: Self-pay | Admitting: Internal Medicine

## 2017-06-22 ENCOUNTER — Encounter: Payer: Self-pay | Admitting: Radiology

## 2017-06-22 LAB — COMPREHENSIVE METABOLIC PANEL
ALT: 41 IU/L (ref 0–44)
AST: 31 IU/L (ref 0–40)
Albumin/Globulin Ratio: 2.3 — ABNORMAL HIGH (ref 1.2–2.2)
Albumin: 4.8 g/dL (ref 3.6–4.8)
Alkaline Phosphatase: 30 IU/L — ABNORMAL LOW (ref 39–117)
BUN/Creatinine Ratio: 14 (ref 10–24)
BUN: 18 mg/dL (ref 8–27)
Bilirubin Total: 0.2 mg/dL (ref 0.0–1.2)
CO2: 19 mmol/L — ABNORMAL LOW (ref 20–29)
Calcium: 9.2 mg/dL (ref 8.6–10.2)
Chloride: 104 mmol/L (ref 96–106)
Creatinine, Ser: 1.27 mg/dL (ref 0.76–1.27)
GFR calc Af Amer: 71 mL/min/{1.73_m2} (ref >59)
GFR calc non Af Amer: 61 mL/min/{1.73_m2} (ref >59)
Globulin, Total: 2.1 g/dL (ref 1.5–4.5)
Glucose: 141 mg/dL — ABNORMAL HIGH (ref 65–99)
Potassium: 4.5 mmol/L (ref 3.5–5.2)
Sodium: 141 mmol/L (ref 134–144)
Total Protein: 6.9 g/dL (ref 6.0–8.5)

## 2017-06-22 LAB — CBC WITH DIFFERENTIAL/PLATELET
Basophils Absolute: 0 10*3/uL (ref 0.0–0.2)
Basos: 1 %
EOS (ABSOLUTE): 0.1 10*3/uL (ref 0.0–0.4)
Eos: 3 %
Hematocrit: 41.2 % (ref 37.5–51.0)
Hemoglobin: 13.8 g/dL (ref 13.0–17.7)
Immature Grans (Abs): 0 10*3/uL (ref 0.0–0.1)
Immature Granulocytes: 0 %
Lymphocytes Absolute: 1.2 10*3/uL (ref 0.7–3.1)
Lymphs: 28 %
MCH: 27.5 pg (ref 26.6–33.0)
MCHC: 33.5 g/dL (ref 31.5–35.7)
MCV: 82 fL (ref 79–97)
Monocytes Absolute: 0.3 10*3/uL (ref 0.1–0.9)
Monocytes: 7 %
Neutrophils Absolute: 2.7 10*3/uL (ref 1.4–7.0)
Neutrophils: 61 %
Platelets: 191 10*3/uL (ref 150–379)
RBC: 5.01 x10E6/uL (ref 4.14–5.80)
RDW: 15 % (ref 12.3–15.4)
WBC: 4.4 10*3/uL (ref 3.4–10.8)

## 2017-06-22 LAB — LDL CHOLESTEROL, DIRECT: LDL Direct: 51 mg/dL (ref 0–99)

## 2017-06-22 LAB — LIPID PANEL
Chol/HDL Ratio: 3.8 ratio (ref 0.0–5.0)
Cholesterol, Total: 98 mg/dL — ABNORMAL LOW (ref 100–199)
HDL: 26 mg/dL — ABNORMAL LOW (ref >39)
LDL Calculated: 33 mg/dL (ref 0–99)
Triglycerides: 195 mg/dL — ABNORMAL HIGH (ref 0–149)
VLDL Cholesterol Cal: 39 mg/dL (ref 5–40)

## 2017-07-06 ENCOUNTER — Other Ambulatory Visit: Payer: Self-pay | Admitting: Emergency Medicine

## 2017-07-06 DIAGNOSIS — E785 Hyperlipidemia, unspecified: Secondary | ICD-10-CM

## 2017-07-06 DIAGNOSIS — E1165 Type 2 diabetes mellitus with hyperglycemia: Secondary | ICD-10-CM

## 2017-07-13 ENCOUNTER — Other Ambulatory Visit: Payer: Self-pay | Admitting: Interventional Cardiology

## 2017-07-18 ENCOUNTER — Other Ambulatory Visit: Payer: Self-pay | Admitting: Internal Medicine

## 2017-07-18 ENCOUNTER — Encounter: Payer: Self-pay | Admitting: Internal Medicine

## 2017-07-19 MED ORDER — DULAGLUTIDE 1.5 MG/0.5ML ~~LOC~~ SOAJ: SUBCUTANEOUS | 5 refills | Status: DC

## 2017-07-19 NOTE — Telephone Encounter (Signed)
OK to send trulicity 1.5 mg weekly #4 with 5 refills. Please take off the 0.75 mg dose from his med list.

## 2017-07-19 NOTE — Telephone Encounter (Signed)
Should patient be moved up to the 1.5 dose? Please advise

## 2017-07-25 ENCOUNTER — Ambulatory Visit: Payer: BLUE CROSS/BLUE SHIELD | Admitting: Emergency Medicine

## 2017-07-25 ENCOUNTER — Encounter: Payer: Self-pay | Admitting: Emergency Medicine

## 2017-07-25 VITALS — BP 132/82 | HR 84 | Temp 98.3°F | Resp 18 | Ht 70.0 in | Wt 231.2 lb

## 2017-07-25 DIAGNOSIS — R05 Cough: Secondary | ICD-10-CM | POA: Diagnosis not present

## 2017-07-25 DIAGNOSIS — R0989 Other specified symptoms and signs involving the circulatory and respiratory systems: Secondary | ICD-10-CM | POA: Diagnosis not present

## 2017-07-25 DIAGNOSIS — J069 Acute upper respiratory infection, unspecified: Secondary | ICD-10-CM | POA: Diagnosis not present

## 2017-07-25 DIAGNOSIS — R051 Acute cough: Secondary | ICD-10-CM | POA: Insufficient documentation

## 2017-07-25 DIAGNOSIS — R059 Cough, unspecified: Secondary | ICD-10-CM | POA: Insufficient documentation

## 2017-07-25 MED ORDER — PROMETHAZINE-CODEINE 6.25-10 MG/5ML PO SYRP
5.0000 mL | ORAL_SOLUTION | Freq: Every evening | ORAL | 0 refills | Status: DC | PRN
Start: 2017-07-25 — End: 2017-08-08

## 2017-07-25 MED ORDER — BENZONATATE 200 MG PO CAPS: 200.0000 mg | ORAL_CAPSULE | Freq: Two times a day (BID) | ORAL | 0 refills | Status: DC | PRN

## 2017-07-25 NOTE — Progress Notes (Signed)
[name] Love 62 y.o.   Chief Complaint  Patient presents with  . Cough    x1 week; dry non productive cough; moved to chest     HISTORY OF PRESENT ILLNESS: This is a 62 y.o. male complaining of dry cough x 1 week; recently exposed to family members who have also been sick; denies fever, chills, sob, n/v; states he feels better than at the onset but still has persistent cough unresponsive to OTC meds.  Cough  This is a new problem. The current episode started in the past 7 days. The problem has been waxing and waning. The problem occurs every few minutes. The cough is non-productive. Associated symptoms include nasal congestion. Pertinent negatives include no chest pain, chills, ear pain, fever, headaches, hemoptysis, myalgias, rash, sore throat, shortness of breath or wheezing. Nothing aggravates the symptoms. Risk factors: none. He has tried OTC cough suppressant for the symptoms. The treatment provided mild relief. There is no history of asthma, COPD or pneumonia.     Prior to Admission medications   Medication Sig Start Date End Date Taking? Authorizing Provider  albuterol (PROVENTIL HFA;VENTOLIN HFA) 108 (90 Base) MCG/ACT inhaler Inhale 1-2 puffs into the lungs every 4 (four) hours as needed for wheezing or shortness of breath. 04/09/17  Yes [name], MD  aspirin 325 MG EC tablet Take 325 mg by mouth daily.   Yes [provider]  atorvastatin (LIPITOR) 40 MG tablet TAKE 1 TABLET BY MOUTH ONCE DAILY 07/13/17  Yes [name], MD  Continuous Blood Gluc Sensor (FREESTYLE LIBRE SENSOR SYSTEM) MISC USE AS DIRECTED 07/06/17  Yes [name], MD  dapagliflozin propanediol (FARXIGA) 5 MG TABS tablet Take 5 mg by mouth daily. 06/20/17 09/18/17 Yes Philemon Kingdom, MD  Dulaglutide (TRULICITY) 1.5 RC/7.8LF SOPN Inject 1.5 mg into the skin once a week 07/19/17  Yes Philemon Kingdom, MD  fenofibrate 160 MG tablet TAKE 1 TABLET BY MOUTH ONCE DAILY AS  NEEDED; NEEDS OFFICE VISIT FOR FURTHER REFILLS. 07/06/17  Yes [name], MD  glipiZIDE (GLIPIZIDE XL) 10 MG 24 hr tablet Take 1 tablet (10 mg total) by mouth daily with breakfast. 03/21/17  Yes Jaylean Buenaventura, [name], MD  losartan (COZAAR) 50 MG tablet Take 1 tablet (50 mg total) by mouth daily. 03/21/17  Yes [name], MD  metFORMIN (GLUCOPHAGE) 1000 MG tablet Take 1 tablet (1,000 mg total) by mouth 2 (two) times daily with a meal. 03/21/17  Yes Kelten Enochs, [name], MD  Omega-3 Fatty Acids (FISH OIL PO) Take 2,000 mg by mouth 2 (two) times daily.     Yes [provider]  OVER THE COUNTER MEDICATION 665 mg.   Yes [provider]  POTASSIUM GLUCONATE PO Take 595 mg by mouth.   Yes [provider]  PROLENSA 0.07 % SOLN Place 1 drop into the left eye daily. Reported on 10/08/2015 12/18/14  Yes [provider]  sildenafil (VIAGRA) 100 MG tablet Take 1 tablet (100 mg total) by mouth daily as needed for erectile dysfunction. 08/31/16  Yes [name], MD    No Known Allergies  Patient Active Problem List   Diagnosis Date Noted  . Acute upper respiratory infection 03/21/2017  . Need for prophylactic vaccination and inoculation against influenza 03/21/2017  . Erectile dysfunction 09/15/2013  . Nonspecific abnormal electrocardiogram (ECG) (EKG) 06/10/2013  . Poorly controlled type 2 diabetes mellitus with circulatory disorder ([place]) 09/12/2012  . Coronary artery disease 07/14/2011  . History of coronary  artery bypass surgery 07/14/2011  . High blood triglycerides 02/23/2009  . Obesity 02/23/2009  . Essential hypertension 02/23/2009    Past Medical History:  Diagnosis Date  . Coronary artery disease    post CABG x3 in 2007  . Diabetes mellitus   . Hyperlipidemia   . Hypertension   . Obesity     Past Surgical History:  Procedure Laterality Date  . CARDIAC CATHETERIZATION  EF= 45-50%   EF= 45-50% -- Three-vessel coronary  artery disease with high-grade lesions in the proximal left anterior descending artery and multiple lesions in the right coronary -- Low normal ejection fraction -- Stable exertional angina -- [name], M.D. [facility]  . CORONARY ARTERY BYPASS GRAFT  02/19/2006     x3 (left internal mammary artery to LAD, left radial artery to posterior descending, saphenous vein graft to first diagonal), endoscopic vein harvest right leg -- SURGEON:  [name]. [name], M.D.  . HERNIA REPAIR     at 62 y/o  . tripple bypass      Social History   Socioeconomic History  . Marital status: Married    Spouse name: Not on file  . Number of children: Not on file  . Years of education: Not on file  . Highest education level: Not on file  Social Needs  . Financial resource strain: Not on file  . Food insecurity - worry: Not on file  . Food insecurity - inability: Not on file  . Transportation needs - medical: Not on file  . Transportation needs - non-medical: Not on file  Occupational History  . Not on file  Tobacco Use  . Smoking status: Never Smoker  . Smokeless tobacco: Never Used  Substance and Sexual Activity  . Alcohol use: Yes    Alcohol/week: 0.6 oz    Types: 1 Standard drinks or equivalent per week  . Drug use: No  . Sexual activity: Yes  Other Topics Concern  . Not on file  Social History Narrative  . Not on file    Family History  Problem Relation Age of Onset  . Heart disease Mother   . Cancer Father      Review of Systems  Constitutional: Negative.  Negative for chills and fever.  HENT: Negative.  Negative for ear pain and sore throat.   Eyes: Negative.   Respiratory: Positive for cough. Negative for hemoptysis, shortness of breath and wheezing.   Cardiovascular: Negative for chest pain.  Gastrointestinal: Negative.  Negative for abdominal pain, diarrhea, nausea and vomiting.  Musculoskeletal: Negative for back pain, myalgias and neck pain.  Skin: Negative.  Negative for  rash.  Neurological: Negative.  Negative for dizziness and headaches.  All other systems reviewed and are negative.  Vitals:   07/25/17 1156  BP: 132/82  Pulse: 84  Resp: 18  Temp: 98.3 F (36.8 C)  SpO2: 97%     Physical Exam  Constitutional: He is oriented to person, place, and time. He appears well-developed and well-nourished.  HENT:  Head: Normocephalic and atraumatic.  Right Ear: External ear normal.  Left Ear: External ear normal.  Nose: Nose normal.  Mouth/Throat: Oropharynx is clear and moist.  Eyes: Conjunctivae and EOM are normal. Pupils are equal, round, and reactive to light.  Neck: Normal range of motion. Neck supple.  Cardiovascular: Normal rate, regular rhythm and normal heart sounds.  Pulmonary/Chest: Effort normal and breath sounds normal.  Abdominal: Soft. Bowel sounds are normal. He exhibits no distension. There is no tenderness.  Musculoskeletal: Normal range of motion.  Neurological: He is alert and oriented to person, place, and time. No sensory deficit. He exhibits normal muscle tone.  Skin: Skin is warm and dry. Capillary refill takes less than 2 seconds. No rash noted.  Psychiatric: He has a normal mood and affect. His behavior is normal.  Vitals reviewed.    ASSESSMENT & PLAN: [name] Love was seen today for cough.  Diagnoses and all orders for this visit:  Cough -     benzonatate (TESSALON) 200 MG capsule; Take 1 capsule (200 mg total) by mouth 2 (two) times daily as needed for cough. -     promethazine-codeine (PHENERGAN WITH CODEINE) 6.25-10 MG/5ML syrup; Take 5 mLs by mouth at bedtime as needed for cough.  Viral upper respiratory illness  Chest congestion  Other orders -     Cancel: DG Chest 2 View; Future    Patient Instructions   Cough, Adult A cough helps to clear your throat and lungs. A cough may last only 2-3 weeks (acute), or it may last longer than 8 weeks (chronic). Many different things can cause a cough. A cough may be a  sign of an illness or another medical condition. Follow these instructions at home:  Pay attention to any changes in your cough.  Take medicines only as told by your doctor. ? If you were prescribed an antibiotic medicine, take it as told by your doctor. Do not stop taking it even if you start to feel better. ? Talk with your doctor before you try using a cough medicine.  Drink enough fluid to keep your pee (urine) clear or pale yellow.  If the air is dry, use a cold steam vaporizer or humidifier in your home.  Stay away from things that make you cough at work or at home.  If your cough is worse at night, try using extra pillows to raise your head up higher while you sleep.  Do not smoke, and try not to be around smoke. If you need help quitting, ask your doctor.  Do not have caffeine.  Do not drink alcohol.  Rest as needed. Contact a doctor if:  You have new problems (symptoms).  You cough up yellow fluid (pus).  Your cough does not get better after 2-3 weeks, or your cough gets worse.  Medicine does not help your cough and you are not sleeping well.  You have pain that gets worse or pain that is not helped with medicine.  You have a fever.  You are losing weight and you do not know why.  You have night sweats. Get help right away if:  You cough up blood.  You have trouble breathing.  Your heartbeat is very fast. This information is not intended to replace advice given to you by your health care provider. Make sure you discuss any questions you have with your health care provider. Document Released: 03/23/2011 Document Revised: 12/16/2015 Document Reviewed: 09/16/2014 Elsevier Interactive Patient Education  2018 [employer].     IF you received an x-ray today, you will receive an invoice from [facility]. Please contact [facility] at [phone] with questions or concerns regarding your invoice.   IF you received labwork today, you will  receive an invoice from [place]. Please contact [facility] at [phone] with questions or concerns regarding your invoice.   Our billing staff will not be able to assist you with questions regarding bills from these companies.  You will be contacted with the lab results  as soon as they are available. The fastest way to get your results is to activate your My Chart account. Instructions are located on the last page of this paperwork. If you have not heard from [place] regarding the results in 2 weeks, please contact this office.      Cough, Adult A cough helps to clear your throat and lungs. A cough may last only 2-3 weeks (acute), or it may last longer than 8 weeks (chronic). Many different things can cause a cough. A cough may be a sign of an illness or another medical condition. Follow these instructions at home:  Pay attention to any changes in your cough.  Take medicines only as told by your doctor. ? If you were prescribed an antibiotic medicine, take it as told by your doctor. Do not stop taking it even if you start to feel better. ? Talk with your doctor before you try using a cough medicine.  Drink enough fluid to keep your pee (urine) clear or pale yellow.  If the air is dry, use a cold steam vaporizer or humidifier in your home.  Stay away from things that make you cough at work or at home.  If your cough is worse at night, try using extra pillows to raise your head up higher while you sleep.  Do not smoke, and try not to be around smoke. If you need help quitting, ask your doctor.  Do not have caffeine.  Do not drink alcohol.  Rest as needed. Contact a doctor if:  You have new problems (symptoms).  You cough up yellow fluid (pus).  Your cough does not get better after 2-3 weeks, or your cough gets worse.  Medicine does not help your cough and you are not sleeping well.  You have pain that gets worse or pain that is not helped with medicine.  You have a  fever.  You are losing weight and you do not know why.  You have night sweats. Get help right away if:  You cough up blood.  You have trouble breathing.  Your heartbeat is very fast. This information is not intended to replace advice given to you by your health care provider. Make sure you discuss any questions you have with your health care provider. Document Released: 03/23/2011 Document Revised: 12/16/2015 Document Reviewed: 09/16/2014 Elsevier Interactive Patient Education  2018 Else[employer] Inc.      Agustina Caroli, MD [facility]

## 2017-07-25 NOTE — Patient Instructions (Addendum)
Cough, Adult A cough helps to clear your throat and lungs. A cough may last only 2-3 weeks (acute), or it may last longer than 8 weeks (chronic). Many different things can cause a cough. A cough may be a sign of an illness or another medical condition. Follow these instructions at home:  Pay attention to any changes in your cough.  Take medicines only as told by your doctor. ? If you were prescribed an antibiotic medicine, take it as told by your doctor. Do not stop taking it even if you start to feel better. ? Talk with your doctor before you try using a cough medicine.  Drink enough fluid to keep your pee (urine) clear or pale yellow.  If the air is dry, use a cold steam vaporizer or humidifier in your home.  Stay away from things that make you cough at work or at home.  If your cough is worse at night, try using extra pillows to raise your head up higher while you sleep.  Do not smoke, and try not to be around smoke. If you need help quitting, ask your doctor.  Do not have caffeine.  Do not drink alcohol.  Rest as needed. Contact a doctor if:  You have new problems (symptoms).  You cough up yellow fluid (pus).  Your cough does not get better after 2-3 weeks, or your cough gets worse.  Medicine does not help your cough and you are not sleeping well.  You have pain that gets worse or pain that is not helped with medicine.  You have a fever.  You are losing weight and you do not know why.  You have night sweats. Get help right away if:  You cough up blood.  You have trouble breathing.  Your heartbeat is very fast. This information is not intended to replace advice given to you by your health care provider. Make sure you discuss any questions you have with your health care provider. Document Released: 03/23/2011 Document Revised: 12/16/2015 Document Reviewed: 09/16/2014 Elsevier Interactive Patient Education  2018 [employer].     IF you received an x-ray  today, you will receive an invoice from [facility]. Please contact [facility] at [phone] with questions or concerns regarding your invoice.   IF you received labwork today, you will receive an invoice from [place]. Please contact [facility] at [phone] with questions or concerns regarding your invoice.   Our billing staff will not be able to assist you with questions regarding bills from these companies.  You will be contacted with the lab results as soon as they are available. The fastest way to get your results is to activate your My Chart account. Instructions are located on the last page of this paperwork. If you have not heard from [place] regarding the results in 2 weeks, please contact this office.      Cough, Adult A cough helps to clear your throat and lungs. A cough may last only 2-3 weeks (acute), or it may last longer than 8 weeks (chronic). Many different things can cause a cough. A cough may be a sign of an illness or another medical condition. Follow these instructions at home:  Pay attention to any changes in your cough.  Take medicines only as told by your doctor. ? If you were prescribed an antibiotic medicine, take it as told by your doctor. Do not stop taking it even if you start to feel better. ? Talk with your doctor before you try using  a cough medicine.  Drink enough fluid to keep your pee (urine) clear or pale yellow.  If the air is dry, use a cold steam vaporizer or humidifier in your home.  Stay away from things that make you cough at work or at home.  If your cough is worse at night, try using extra pillows to raise your head up higher while you sleep.  Do not smoke, and try not to be around smoke. If you need help quitting, ask your doctor.  Do not have caffeine.  Do not drink alcohol.  Rest as needed. Contact a doctor if:  You have new problems (symptoms).  You cough up yellow fluid (pus).  Your cough does not get better  after 2-3 weeks, or your cough gets worse.  Medicine does not help your cough and you are not sleeping well.  You have pain that gets worse or pain that is not helped with medicine.  You have a fever.  You are losing weight and you do not know why.  You have night sweats. Get help right away if:  You cough up blood.  You have trouble breathing.  Your heartbeat is very fast. This information is not intended to replace advice given to you by your health care provider. Make sure you discuss any questions you have with your health care provider. Document Released: 03/23/2011 Document Revised: 12/16/2015 Document Reviewed: 09/16/2014 Elsevier Interactive Patient Education  [employer].

## 2017-07-26 DIAGNOSIS — M7501 Adhesive capsulitis of right shoulder: Secondary | ICD-10-CM | POA: Insufficient documentation

## 2017-07-26 DIAGNOSIS — M7502 Adhesive capsulitis of left shoulder: Secondary | ICD-10-CM | POA: Insufficient documentation

## 2017-08-08 ENCOUNTER — Ambulatory Visit: Payer: 59 | Admitting: Internal Medicine

## 2017-08-08 ENCOUNTER — Encounter: Payer: Self-pay | Admitting: Internal Medicine

## 2017-08-08 VITALS — BP 138/76 | HR 75 | Ht 70.0 in | Wt 231.0 lb

## 2017-08-08 DIAGNOSIS — E1159 Type 2 diabetes mellitus with other circulatory complications: Secondary | ICD-10-CM | POA: Diagnosis not present

## 2017-08-08 DIAGNOSIS — E118 Type 2 diabetes mellitus with unspecified complications: Secondary | ICD-10-CM | POA: Diagnosis not present

## 2017-08-08 DIAGNOSIS — E1165 Type 2 diabetes mellitus with hyperglycemia: Secondary | ICD-10-CM | POA: Diagnosis not present

## 2017-08-08 DIAGNOSIS — R944 Abnormal results of kidney function studies: Secondary | ICD-10-CM

## 2017-08-08 DIAGNOSIS — IMO0002 Reserved for concepts with insufficient information to code with codable children: Secondary | ICD-10-CM

## 2017-08-08 MED ORDER — DAPAGLIFLOZIN PROPANEDIOL 5 MG PO TABS: 5.0000 mg | ORAL_TABLET | Freq: Every day | ORAL | 3 refills | Status: AC

## 2017-08-08 MED ORDER — FREESTYLE LIBRE 14 DAY READER DEVI: 1.0000 | Freq: Once | 1 refills | Status: AC

## 2017-08-08 MED ORDER — FREESTYLE LIBRE 14 DAY SENSOR MISC: 1.0000 | 11 refills | Status: DC

## 2017-08-08 MED ORDER — GLIPIZIDE ER 5 MG PO TB24: 5.0000 mg | ORAL_TABLET | Freq: Every day | ORAL | 1 refills | Status: DC

## 2017-08-08 NOTE — Patient Instructions (Addendum)
Please continue: - Metformin 1000 mg 2x a day, with meals - Farxiga 5 mg in am. - Trulicity 1.5 mg weekly  Please cut the Glipizide ER 10 mg in half and take it 15 min before first meal of the day.  Please return in 3 months with your sugar log.

## 2017-08-08 NOTE — Progress Notes (Signed)
Cardiology Office Note   Date:  08/09/2017   ID:  Scotti Love, DOB 1955/10/11, MRN [id number]  PCP:  [name], MD    No chief complaint on file. CAD   Wt Readings from Last 3 Encounters:  08/09/17 231 lb 12.8 oz (105.1 kg)  08/08/17 231 lb (104.8 kg)  07/25/17 231 lb 3.2 oz (104.9 kg)       History of Present Illness: [name] Love is a 62 y.o. male  who had CABG in 2007. He had a nuclear stress test in 2012 that was ok.  Echo in 2014: Left ventricle: The cavity size was normal. There was mild focal basal hypertrophy of the septum. Systolic function was normal. The estimated ejection fraction was in the range of 55% to 60%. Wall motion was normal; there were no regional wall motion abnormalities. - Left atrium: The atrium was mildly dilated.  Denies : Chest pain. Dizziness. Leg edema. Nitroglycerin use. Orthopnea. Palpitations. Paroxysmal nocturnal dyspnea. Shortness of breath. Syncope.   He does the elliptical on a fairly regular basis.  No cardiac sx.  He has to rehab his shoulders.    He will be getting platelet rich plasma plasma injections for his hamstrings.    Past Medical History:  Diagnosis Date  . Coronary artery disease    post CABG x3 in 2007  . Diabetes mellitus   . Hyperlipidemia   . Hypertension   . Obesity     Past Surgical History:  Procedure Laterality Date  . CARDIAC CATHETERIZATION  EF= 45-50%   EF= 45-50% -- Three-vessel coronary artery disease with high-grade lesions in the proximal left anterior descending artery and multiple lesions in the right coronary -- Low normal ejection fraction -- Stable exertional angina -- [name], M.D. [facility]  . CORONARY ARTERY BYPASS GRAFT  02/19/2006     x3 (left internal mammary artery to LAD, left radial artery to posterior descending, saphenous vein graft to first diagonal), endoscopic vein harvest right leg -- SURGEON:  [name]. [name], M.D.  .  HERNIA REPAIR     at 62 y/o  . tripple bypass       Current Outpatient Medications  Medication Sig Dispense Refill  . albuterol (PROVENTIL HFA;VENTOLIN HFA) 108 (90 Base) MCG/ACT inhaler Inhale 1-2 puffs into the lungs every 4 (four) hours as needed for wheezing or shortness of breath. 1 Inhaler 0  . aspirin 325 MG EC tablet Take 162.5 mg by mouth daily.     Marland Kitchen atorvastatin (LIPITOR) 40 MG tablet TAKE 1 TABLET BY MOUTH ONCE DAILY 30 tablet 0  . benzonatate (TESSALON) 200 MG capsule Take 1 capsule (200 mg total) by mouth 2 (two) times daily as needed for cough. 20 capsule 0  . Continuous Blood Gluc Sensor (FREESTYLE LIBRE 14 DAY SENSOR) MISC 1 each by Does not apply route every 14 (fourteen) days. Change every 2 weeks 2 each 11  . dapagliflozin propanediol (FARXIGA) 5 MG TABS tablet Take 5 mg by mouth daily. 90 tablet 3  . Dulaglutide (TRULICITY) 1.5 OB/0.9GG SOPN Inject 1.5 mg into the skin once a week 4 pen 5  . fenofibrate 160 MG tablet TAKE 1 TABLET BY MOUTH ONCE DAILY AS NEEDED; NEEDS OFFICE VISIT FOR FURTHER REFILLS. 30 tablet 0  . glipiZIDE (GLUCOTROL XL) 5 MG 24 hr tablet Take 1 tablet (5 mg total) by mouth daily with breakfast. 90 tablet 1  . losartan (COZAAR) 50 MG tablet Take 1 tablet (50 mg total) by  mouth daily. 90 tablet 3  . metFORMIN (GLUCOPHAGE) 1000 MG tablet Take 1 tablet (1,000 mg total) by mouth 2 (two) times daily with a meal. 180 tablet 6  . Omega-3 Fatty Acids (FISH OIL PO) Take 2,000 mg by mouth 2 (two) times daily.      Marland Kitchen OVER THE COUNTER MEDICATION 665 mg.    . POTASSIUM GLUCONATE PO Take 595 mg by mouth.    . sildenafil (VIAGRA) 100 MG tablet Take 1 tablet (100 mg total) by mouth daily as needed for erectile dysfunction. 12 tablet 11   No current facility-administered medications for this visit.     Allergies:   Patient has no known allergies.    Social History:  The patient  reports that  has never smoked. he has never used smokeless tobacco. He reports that  he drinks about 0.6 oz of alcohol per week. He reports that he does not use drugs.   Family History:  The patient's family history includes Cancer in his father; Heart disease in his mother.    ROS:  Please see the history of present illness.   Otherwise, review of systems are positive for shoulder pain.   All other systems are reviewed and negative.    PHYSICAL EXAM: VS:  BP 128/70   Pulse 73   Ht 5\' 10"  (1.778 m)   Wt 231 lb 12.8 oz (105.1 kg)   SpO2 96%   BMI 33.26 kg/m  , BMI Body mass index is 33.26 kg/m. GEN: Well nourished, well developed, in no acute distress  HEENT: normal  Neck: no JVD, carotid bruits, or masses Cardiac: RRR; no murmurs, rubs, or gallops,no edema  Respiratory:  clear to auscultation bilaterally, normal work of breathing GI: soft, nontender, nondistended, + BS MS: no deformity or atrophy  Skin: warm and dry, no rash Neuro:  Strength and sensation are intact Psych: euthymic mood, full affect   EKG:   The ekg ordered today demonstrates NSR, lateral T wave changes- less pronounced   Recent Labs: 2017-06-24: ALT 41; BUN 18; Creatinine, Ser 1.27; Hemoglobin 13.8; Platelets 191; Potassium 4.5; Sodium 141   Lipid Panel    Component Value Date/Time   CHOL 98 (L) 2017/06/24 1036   TRIG 195 (H) 06-24-2017 1036   HDL 26 (L) 2017-06-24 1036   CHOLHDL 3.8 06/24/17 1036   CHOLHDL 5.4 (H) 10/08/2015 1051   VLDL 76 (H) 10/08/2015 1051   LDLCALC 33 06/24/17 1036   LDLDIRECT 51 06-24-17 1036     Other studies Reviewed: Additional studies/ records that were reviewed today with results demonstrating: NSR, lateral ST changes- no significant change from prior.   ASSESSMENT AND PLAN:  1. CAD: No angina.  Continue aggressive secondary prevention. He prefers to stay on the 162.5 mg of aspirin dose. 2. Hyperlipidemia: COntinue atorvastatin 40 mg daily.  LDL 33 in 11/18. 3. Abnl ECG: No significant change from prior.  T wave changes are less pronounced  today compared to October 23, 2015 4. Obesity: 229 lbs last year.  Continue to work on weight loss. Gained weight over the holidays.   5. HTN: Blood pressure controlled.  6. DM: Working on A1C of 8.2 with Dr. Renne Crigler.  Encouraged careful diet with exercise.     Current medicines are reviewed at length with the patient today.  The patient concerns regarding his medicines were addressed.  The following changes have been made:  No change  Labs/ tests ordered today include:  No orders of the defined types were  placed in this encounter.   Recommend 150 minutes/week of aerobic exercise Low fat, low carb, high fiber diet recommended  Disposition:   FU in 1 year   Signed, Larae Grooms, MD  08/09/2017 1:47 PM    [facility] [address] [place] [place], [place],   [address] Phone: [phone]; Fax: [phone]

## 2017-08-08 NOTE — Progress Notes (Signed)
Patient ID: [name] Love, male   DOB: 03-Apr-1956, 62 y.o.   MRN: [id number]   HPI: [name] Love is a 62 y.o.-year-old male, initially referred by his PCP, Dr. Mitchel Honour, returning for follow-up for DM2, dx in ~2008, non-insulin-dependent, uncontrolled, with long term complications (CAD - s/p CABG in 2007, CKD, PN).  Last visit 2 months ago.  Last hemoglobin A1c was: Lab Results  Component Value Date   HGBA1C 8.2 06/21/2017   HGBA1C 9.5 03/21/2017   HGBA1C 8.8 07/27/2016  He got a steroid inj in shoulder >> 05/10/2017.  Pt is on a regimen of: - Metformin 1000 mg 2x a day, with meals - Glipizide ER 10 mg in am - Invokana >> Farxiga 5 mg in am. - Trulicity 1.5 mg weekly - started 05/2017 no side effects..   Patient checks his sugars frequently with his freestyle libre CGM. We downloaded the reports and we will scan them in patient's chart:  Lows (<70): 0% >> 7% In range (80-180): 34% >> 85% Highs (>180): 66% >> 8%  Ave 204 +/- 57.1 >> 124 +/- 37.8  CBG 25-75%: - 6-10 am: 65-100 - am: 130-175 >> 65-100 - 2h after b'fast: 100-130 - lunch: 130-160 (close to b'fast) - 2h after lunch: 125-175 - dinner: 175-320 >> 100-130 - 2h after dinner: 115-154 - bedtime: 190-280 >> see above Lowest sugar was 108 >> 50's; unclear at what level he has hypoglycemia awareness. Highest sugar was 300s >> 241.  Pt's meals are: - Breakfast: bagel, OJ - Lunch: soup or sandwich or leftovers - Dinner: meat, veggies - Snacks: not daily  - + CKD, last BUN/creatinine:  Lab Results  Component Value Date   BUN 18 06/21/2017   BUN 25 05/29/2017   CREATININE 1.27 06/21/2017   CREATININE 1.48 (H) 05/29/2017  On losartan 50. -+ HL; last set of lipids: Lab Results  Component Value Date   CHOL 98 (L) 06/21/2017   HDL 26 (L) 06/21/2017   LDLCALC 33 06/21/2017   LDLDIRECT 51 06/21/2017   TRIG 195 (H) 06/21/2017   CHOLHDL 3.8 06/21/2017  On fenofibrate 160, omega-3 fatty acids  200 mg twice a day and Zocor 40. - last eye exam was in 03/2017: No DR. Dr. Alois Cliche. - + numbness and tingling in his feet. On ASA 81.  ROS: Constitutional: no weight gain/no weight loss, no fatigue, no subjective hyperthermia, no subjective hypothermia Eyes: no blurry vision, no xerophthalmia ENT: no sore throat, no nodules palpated in throat, no dysphagia, no odynophagia, no hoarseness Cardiovascular: no CP/no SOB/no palpitations/no leg swelling Respiratory: no cough/no SOB/no wheezing Gastrointestinal: no N/no V/no D/no C/no acid reflux Musculoskeletal: no muscle aches/no joint aches Skin: no rashes, no hair loss Neurological: no tremors/+ numbness/+ tingling/no dizziness  I reviewed pt's medications, allergies, PMH, social hx, family hx, and changes were documented in the history of present illness. Otherwise, unchanged from my initial visit note.  ASA 81  Past Medical History:  Diagnosis Date  . Coronary artery disease    post CABG x3 in 2007  . Diabetes mellitus   . Hyperlipidemia   . Hypertension   . Obesity    Past Surgical History:  Procedure Laterality Date  . CARDIAC CATHETERIZATION  EF= 45-50%   EF= 45-50% -- Three-vessel coronary artery disease with high-grade lesions in the proximal left anterior descending artery and multiple lesions in the right coronary -- Low normal ejection fraction -- Stable exertional angina -- [name], M.D. [facility]  . CORONARY ARTERY  BYPASS GRAFT  02/19/2006     x3 (left internal mammary artery to LAD, left radial artery to posterior descending, saphenous vein graft to first diagonal), endoscopic vein harvest right leg -- SURGEON:  [name]. [name], M.D.  . HERNIA REPAIR     at 62 y/o  . tripple bypass     Social History   Socioeconomic History  . Marital status: Married    Spouse name: Not on file  . Number of children: 3  Social Needs  Occupational History  . unemployed  Tobacco Use  . Smoking status: Never Smoker   . Smokeless tobacco: Never Used  Substance and Sexual Activity  . Alcohol use: Yes    Alcohol/week:     Types: 1-2 per mo  . Drug use: No  .    Other Topics   .   Social History Narrative  .    Current Outpatient Medications on File Prior to Visit  Medication Sig Dispense Refill  . albuterol (PROVENTIL HFA;VENTOLIN HFA) 108 (90 Base) MCG/ACT inhaler Inhale 1-2 puffs into the lungs every 4 (four) hours as needed for wheezing or shortness of breath. 1 Inhaler 0  . aspirin 325 MG EC tablet Take 325 mg by mouth daily.    Marland Kitchen atorvastatin (LIPITOR) 40 MG tablet TAKE 1 TABLET BY MOUTH ONCE DAILY 30 tablet 0  . benzonatate (TESSALON) 200 MG capsule Take 1 capsule (200 mg total) by mouth 2 (two) times daily as needed for cough. 20 capsule 0  . Continuous Blood Gluc Sensor (FREESTYLE LIBRE SENSOR SYSTEM) MISC USE AS DIRECTED 3 each 1  . dapagliflozin propanediol (FARXIGA) 5 MG TABS tablet Take 5 mg by mouth daily. 150 mg 0  . Dulaglutide (TRULICITY) 1.5 VQ/2.5ZD SOPN Inject 1.5 mg into the skin once a week 4 pen 5  . fenofibrate 160 MG tablet TAKE 1 TABLET BY MOUTH ONCE DAILY AS NEEDED; NEEDS OFFICE VISIT FOR FURTHER REFILLS. 30 tablet 0  . glipiZIDE (GLIPIZIDE XL) 10 MG 24 hr tablet Take 1 tablet (10 mg total) by mouth daily with breakfast. 90 tablet 3  . losartan (COZAAR) 50 MG tablet Take 1 tablet (50 mg total) by mouth daily. 90 tablet 3  . metFORMIN (GLUCOPHAGE) 1000 MG tablet Take 1 tablet (1,000 mg total) by mouth 2 (two) times daily with a meal. 180 tablet 6  . Omega-3 Fatty Acids (FISH OIL PO) Take 2,000 mg by mouth 2 (two) times daily.      Marland Kitchen OVER THE COUNTER MEDICATION 665 mg.    . POTASSIUM GLUCONATE PO Take 595 mg by mouth.    Marland Kitchen PROLENSA 0.07 % SOLN Place 1 drop into the left eye daily. Reported on 10/08/2015    . promethazine-codeine (PHENERGAN WITH CODEINE) 6.25-10 MG/5ML syrup Take 5 mLs by mouth at bedtime as needed for cough. 120 mL 0  . sildenafil (VIAGRA) 100 MG tablet Take 1  tablet (100 mg total) by mouth daily as needed for erectile dysfunction. 12 tablet 11   No current facility-administered medications on file prior to visit.    No Known Allergies Family History  Problem Relation Age of Onset  . Heart disease Mother   . Cancer Father     PE: BP 138/76   Pulse 75   Ht 5\' 10"  (1.778 m)   Wt 231 lb (104.8 kg)   SpO2 96%   BMI 33.15 kg/m  Wt Readings from Last 3 Encounters:  08/08/17 231 lb (104.8 kg)  07/25/17 231 lb  3.2 oz (104.9 kg)  06/21/17 229 lb 3.2 oz (104 kg)   Constitutional: overweight, in NAD Eyes: PERRLA, EOMI, no exophthalmos ENT: moist mucous membranes, no thyromegaly, no cervical lymphadenopathy Cardiovascular: RRR, No MRG Respiratory: CTA B Gastrointestinal: abdomen soft, NT, ND, BS+ Musculoskeletal: no deformities, strength intact in all 4 Skin: moist, warm, no rashes Neurological: no tremor with outstretched hands, DTR normal in all 4  ASSESSMENT: 1. DM2, non-insulin-dependent, uncontrolled, without complications - CAD - s/p CABG in 2007 - CKD - PN  2. Low GFR  PLAN:  1. Patient with long-standing, uncontrolled, diabetes, on oral antidiabetic regimen, to which we added Trulicity at last visit,  as his sugars were increasing with every meal. - At this visit, he is now on a target dose of Trulicity, 1.5 mg weekly, which he just started.  No nausea, vomiting, constipation. - He did notice that his sugars improve with addition of Trulicity - sugars are much better, but they increase after b'fast: ~9 am and then decrease lower than 70 between 6-10 am.  - will reduce the Glipizide XL from 10 to 5 mg  - for now, he will cut the tabs in half as he has many at home - discussed to stop juice and bagels as they have a high Glycemic index. - he continues to use the freestyle libre CGM - I suggested to:  Patient Instructions  Please continue: - Metformin 1000 mg 2x a day, with meals - Farxiga 5 mg in am. - Trulicity 1.5 mg  weekly  Please cut the Glipizide ER 10 mg in half and take it 15 min before first meal of the day.  Please return in 3 months with your sugar log.   - We reviewed together his most recent HbA1c from 1.5 months ago, which was improved from 9.5% to 8.2%. - continue checking sugars at different times of the day - check 1-2x a day, rotating checks - advised for yearly eye exams >> he is UTD - Return to clinic in 3 mo with sugar log   2. Low GFR - At last visit, after starting Farxiga, his GFR decreased, and I advised him to increase hydration and plan to recheck his kidney test at this visit.  However, he did have a repeat kidney test 3 weeks after last visit and the GFR improved. - Continues on low-dose Jearld Shines, MD PhD [facility] Endocrin[facility]

## 2017-08-09 ENCOUNTER — Ambulatory Visit: Payer: BLUE CROSS/BLUE SHIELD | Admitting: Interventional Cardiology

## 2017-08-09 ENCOUNTER — Encounter: Payer: Self-pay | Admitting: Interventional Cardiology

## 2017-08-09 VITALS — BP 128/70 | HR 73 | Ht 70.0 in | Wt 231.8 lb

## 2017-08-09 DIAGNOSIS — I10 Essential (primary) hypertension: Secondary | ICD-10-CM | POA: Diagnosis not present

## 2017-08-09 DIAGNOSIS — I2510 Atherosclerotic heart disease of native coronary artery without angina pectoris: Secondary | ICD-10-CM

## 2017-08-09 DIAGNOSIS — E782 Mixed hyperlipidemia: Secondary | ICD-10-CM

## 2017-08-09 DIAGNOSIS — E1159 Type 2 diabetes mellitus with other circulatory complications: Secondary | ICD-10-CM | POA: Diagnosis not present

## 2017-08-09 MED ORDER — ATORVASTATIN CALCIUM 40 MG PO TABS: 40.0000 mg | ORAL_TABLET | Freq: Every day | ORAL | 3 refills | Status: DC

## 2017-08-09 NOTE — Patient Instructions (Signed)

## 2017-08-17 ENCOUNTER — Encounter: Payer: Self-pay | Admitting: Emergency Medicine

## 2017-08-17 ENCOUNTER — Other Ambulatory Visit: Payer: Self-pay | Admitting: Emergency Medicine

## 2017-08-17 ENCOUNTER — Encounter: Payer: Self-pay | Admitting: Internal Medicine

## 2017-08-17 ENCOUNTER — Other Ambulatory Visit: Payer: Self-pay

## 2017-08-17 ENCOUNTER — Telehealth: Payer: Self-pay | Admitting: Emergency Medicine

## 2017-08-17 ENCOUNTER — Ambulatory Visit: Payer: BLUE CROSS/BLUE SHIELD | Admitting: Emergency Medicine

## 2017-08-17 VITALS — BP 112/60 | HR 95 | Temp 97.5°F | Resp 16 | Ht 70.0 in | Wt 229.0 lb

## 2017-08-17 DIAGNOSIS — R059 Cough, unspecified: Secondary | ICD-10-CM

## 2017-08-17 DIAGNOSIS — E785 Hyperlipidemia, unspecified: Secondary | ICD-10-CM

## 2017-08-17 DIAGNOSIS — R05 Cough: Secondary | ICD-10-CM

## 2017-08-17 DIAGNOSIS — J069 Acute upper respiratory infection, unspecified: Secondary | ICD-10-CM | POA: Diagnosis not present

## 2017-08-17 DIAGNOSIS — J3489 Other specified disorders of nose and nasal sinuses: Secondary | ICD-10-CM

## 2017-08-17 MED ORDER — PROMETHAZINE-CODEINE 6.25-10 MG/5ML PO SYRP: 5.0000 mL | ORAL_SOLUTION | Freq: Every evening | ORAL | 0 refills | Status: DC | PRN

## 2017-08-17 MED ORDER — TRIAMCINOLONE ACETONIDE 55 MCG/ACT NA AERO: 2.0000 | INHALATION_SPRAY | Freq: Every day | NASAL | 12 refills | Status: DC

## 2017-08-17 MED ORDER — CETIRIZINE HCL 10 MG PO TABS: 10.0000 mg | ORAL_TABLET | Freq: Every day | ORAL | 11 refills | Status: DC

## 2017-08-17 NOTE — Telephone Encounter (Signed)
Copied from [place] [id number]. Topic: Quick Communication - See Telephone Encounter >> Aug 17, 2017  4:17 PM Vernona Rieger wrote: CRM for notification. See Telephone encounter for:   08/17/17.  Patient wants to know If he is suppose to be taking fenofibrate 160 MG tablet bc he said DR Mitchel Honour told him at the appt he was going to reject it. He went to the pharmacy and they gave it to him along with all other meds.Please advise, bc patient wants to know what he is suppose to be doing.  Call back [phone]

## 2017-08-17 NOTE — Progress Notes (Signed)
[name] Love 62 y.o.   Chief Complaint  Patient presents with  . Cough    x 3 days  nonproductive  . Nasal Congestion    runny nose  . Medication Refill    FENOFIBRATE    HISTORY OF PRESENT ILLNESS: This is a 62 y.o. male complaining of 4 day h/o dry cough, constant runny nose and sneezing. No other significant symptoms.  HPI   Prior to Admission medications   Medication Sig Start Date End Date Taking? Authorizing Provider  albuterol (PROVENTIL HFA;VENTOLIN HFA) 108 (90 Base) MCG/ACT inhaler Inhale 1-2 puffs into the lungs every 4 (four) hours as needed for wheezing or shortness of breath. 04/09/17  Yes [name], MD  aspirin 325 MG EC tablet Take 162.5 mg by mouth daily.    Yes [provider]  atorvastatin (LIPITOR) 40 MG tablet Take 1 tablet (40 mg total) by mouth daily. 08/09/17  Yes [name], MD  benzonatate (TESSALON) 200 MG capsule Take 1 capsule (200 mg total) by mouth 2 (two) times daily as needed for cough. 07/25/17  Yes Grainger Mccarley, [name], MD  dapagliflozin propanediol (FARXIGA) 5 MG TABS tablet Take 5 mg by mouth daily. 08/08/17 11/06/17 Yes Philemon Kingdom, MD  Dulaglutide (TRULICITY) 1.5 ID/7.8EU SOPN Inject 1.5 mg into the skin once a week 07/19/17  Yes Philemon Kingdom, MD  fenofibrate 160 MG tablet TAKE 1 TABLET BY MOUTH ONCE DAILY AS NEEDED; NEEDS OFFICE VISIT FOR FURTHER REFILLS. 08/17/17  Yes Daphne Karrer, [name], MD  losartan (COZAAR) 50 MG tablet Take 1 tablet (50 mg total) by mouth daily. 03/21/17  Yes [name], MD  metFORMIN (GLUCOPHAGE) 1000 MG tablet Take 1 tablet (1,000 mg total) by mouth 2 (two) times daily with a meal. 03/21/17  Yes Raelee Rossmann, [name], MD  Omega-3 Fatty Acids (FISH OIL PO) Take 2,000 mg by mouth 2 (two) times daily.     Yes [provider]  POTASSIUM GLUCONATE PO Take 595 mg by mouth.   Yes [provider]  sildenafil (VIAGRA) 100 MG tablet Take 1 tablet (100 mg  total) by mouth daily as needed for erectile dysfunction. 08/31/16  Yes Jayin Derousse, [name], MD  Continuous Blood Gluc Sensor (FREESTYLE LIBRE 14 DAY SENSOR) MISC 1 each by Does not apply route every 14 (fourteen) days. Change every 2 weeks 08/08/17   Philemon Kingdom, MD  glipiZIDE (GLUCOTROL XL) 5 MG 24 hr tablet Take 1 tablet (5 mg total) by mouth daily with breakfast. 08/08/17   Philemon Kingdom, MD  OVER THE COUNTER MEDICATION 665 mg.    [provider]    No Known Allergies  Patient Active Problem List   Diagnosis Date Noted  . Decreased calculated GFR 08/08/2017  . Cough 07/25/2017  . Viral upper respiratory illness 07/25/2017  . Chest congestion 07/25/2017  . Acute upper respiratory infection 03/21/2017  . Erectile dysfunction 09/15/2013  . Nonspecific abnormal electrocardiogram (ECG) (EKG) 06/10/2013  . Poorly controlled type 2 diabetes mellitus with circulatory disorder ([place]) 09/12/2012  . Coronary artery disease 07/14/2011  . History of coronary artery bypass surgery 07/14/2011  . High blood triglycerides 02/23/2009  . Obesity 02/23/2009  . Essential hypertension 02/23/2009    Past Medical History:  Diagnosis Date  . Coronary artery disease    post CABG x3 in 2007  . Diabetes mellitus   . Hyperlipidemia   . Hypertension   . Obesity     Past Surgical History:  Procedure Laterality Date  .  CARDIAC CATHETERIZATION  EF= 45-50%   EF= 45-50% -- Three-vessel coronary artery disease with high-grade lesions in the proximal left anterior descending artery and multiple lesions in the right coronary -- Low normal ejection fraction -- Stable exertional angina -- [name], M.D. [facility]  . CORONARY ARTERY BYPASS GRAFT  02/19/2006     x3 (left internal mammary artery to LAD, left radial artery to posterior descending, saphenous vein graft to first diagonal), endoscopic vein harvest right leg -- SURGEON:  [name]. [name], M.D.  . HERNIA REPAIR     at 62 y/o  .  tripple bypass      Social History   Socioeconomic History  . Marital status: Married    Spouse name: Not on file  . Number of children: Not on file  . Years of education: Not on file  . Highest education level: Not on file  Social Needs  . Financial resource strain: Not on file  . Food insecurity - worry: Not on file  . Food insecurity - inability: Not on file  . Transportation needs - medical: Not on file  . Transportation needs - non-medical: Not on file  Occupational History  . Not on file  Tobacco Use  . Smoking status: Never Smoker  . Smokeless tobacco: Never Used  Substance and Sexual Activity  . Alcohol use: Yes    Alcohol/week: 0.6 oz    Types: 1 Standard drinks or equivalent per week  . Drug use: No  . Sexual activity: Yes  Other Topics Concern  . Not on file  Social History Narrative  . Not on file    Family History  Problem Relation Age of Onset  . Heart disease Mother   . Cancer Father      Review of Systems  Constitutional: Negative for chills and fever.  HENT:       Runny nose and sneezing  Eyes: Negative for discharge and redness.  Respiratory: Positive for cough. Negative for shortness of breath and wheezing.   Cardiovascular: Negative for chest pain and palpitations.  Gastrointestinal: Negative for abdominal pain, diarrhea, nausea and vomiting.  Skin: Negative for rash.  Neurological: Negative for dizziness and headaches.  Endo/Heme/Allergies: Negative.   All other systems reviewed and are negative.   Vitals:   08/17/17 1343  BP: 112/60  Pulse: 95  Resp: 16  Temp: (!) 97.5 F (36.4 C)  SpO2: 95%    Physical Exam  Constitutional: He is oriented to person, place, and time. He appears well-developed and well-nourished.  HENT:  Head: Normocephalic and atraumatic.  Nose: Nose normal.  Mouth/Throat: Oropharynx is clear and moist.  Eyes: Conjunctivae and EOM are normal. Pupils are equal, round, and reactive to light.  Neck: Normal  range of motion. Neck supple.  Cardiovascular: Normal rate, regular rhythm and normal heart sounds.  Pulmonary/Chest: Effort normal and breath sounds normal.  Abdominal: Soft. There is no tenderness.  Musculoskeletal: Normal range of motion.  Neurological: He is alert and oriented to person, place, and time. No sensory deficit. He exhibits normal muscle tone.  Skin: Skin is warm and dry. Capillary refill takes less than 2 seconds. No rash noted.  Psychiatric: He has a normal mood and affect. His behavior is normal.  Vitals reviewed.    ASSESSMENT & PLAN: [name] Love was seen today for cough, nasal congestion and medication refill.  Diagnoses and all orders for this visit:  Acute upper respiratory infection  Rhinorrhea -     triamcinolone (NASACORT) 55 MCG/ACT  AERO nasal inhaler; Place 2 sprays into the nose daily. -     cetirizine (ZYRTEC) 10 MG tablet; Take 1 tablet (10 mg total) by mouth daily.  Cough -     promethazine-codeine (PHENERGAN WITH CODEINE) 6.25-10 MG/5ML syrup; Take 5 mLs by mouth at bedtime as needed for cough.     Patient Instructions       IF you received an x-ray today, you will receive an invoice from [facility]. Please contact [facility] at [phone] with questions or concerns regarding your invoice.   IF you received labwork today, you will receive an invoice from [facility]. Please contact [facility] at [phone] with questions or concerns regarding your invoice.   Our billing staff will not be able to assist you with questions regarding bills from these companies.  You will be contacted with the lab results as soon as they are available. The fastest way to get your results is to activate your My Chart account. Instructions are located on the last page of this paperwork. If you have not heard from [place] regarding the results in 2 weeks, please contact this office.     Upper Respiratory Infection, Adult Most upper respiratory  infections (URIs) are caused by a virus. A URI affects the nose, throat, and upper air passages. The most common type of URI is often called "the common cold." Follow these instructions at home:  Take medicines only as told by your doctor.  Gargle warm saltwater or take cough drops to comfort your throat as told by your doctor.  Use a warm mist humidifier or inhale steam from a shower to increase air moisture. This may make it easier to breathe.  Drink enough fluid to keep your pee (urine) clear or pale yellow.  Eat soups and other clear broths.  Have a healthy diet.  Rest as needed.  Go back to work when your fever is gone or your doctor says it is okay. ? You may need to stay home longer to avoid giving your URI to others. ? You can also wear a face mask and wash your hands often to prevent spread of the virus.  Use your inhaler more if you have asthma.  Do not use any tobacco products, including cigarettes, chewing tobacco, or electronic cigarettes. If you need help quitting, ask your doctor. Contact a doctor if:  You are getting worse, not better.  Your symptoms are not helped by medicine.  You have chills.  You are getting more short of breath.  You have brown or red mucus.  You have yellow or brown discharge from your nose.  You have pain in your face, especially when you bend forward.  You have a fever.  You have puffy (swollen) neck glands.  You have pain while swallowing.  You have white areas in the back of your throat. Get help right away if:  You have very bad or constant: ? Headache. ? Ear pain. ? Pain in your forehead, behind your eyes, and over your cheekbones (sinus pain). ? Chest pain.  You have long-lasting (chronic) lung disease and any of the following: ? Wheezing. ? Long-lasting cough. ? Coughing up blood. ? A change in your usual mucus.  You have a stiff neck.  You have changes in  your: ? Vision. ? Hearing. ? Thinking. ? Mood. This information is not intended to replace advice given to you by your health care provider. Make sure you discuss any questions you have with your health care  provider. Document Released: 12/27/2007 Document Revised: 03/12/2016 Document Reviewed: 10/15/2013 Elsevier Interactive Patient Education  2018 Elsevier Inc.     Agustina Caroli, MD [facility]

## 2017-08-17 NOTE — Patient Instructions (Addendum)
     IF you received an x-ray today, you will receive an invoice from [facility]. Please contact [facility] at [phone] with questions or concerns regarding your invoice.   IF you received labwork today, you will receive an invoice from [facility]. Please contact [facility] at [phone] with questions or concerns regarding your invoice.   Our billing staff will not be able to assist you with questions regarding bills from these companies.  You will be contacted with the lab results as soon as they are available. The fastest way to get your results is to activate your My Chart account. Instructions are located on the last page of this paperwork. If you have not heard from us regarding the results in 2 weeks, please contact this office.     Upper Respiratory Infection, Adult Most upper respiratory infections (URIs) are caused by a virus. A URI affects the nose, throat, and upper air passages. The most common type of URI is often called "the common cold." Follow these instructions at home:  Take medicines only as told by your doctor.  Gargle warm saltwater or take cough drops to comfort your throat as told by your doctor.  Use a warm mist humidifier or inhale steam from a shower to increase air moisture. This may make it easier to breathe.  Drink enough fluid to keep your pee (urine) clear or pale yellow.  Eat soups and other clear broths.  Have a healthy diet.  Rest as needed.  Go back to work when your fever is gone or your doctor says it is okay. ? You may need to stay home longer to avoid giving your URI to others. ? You can also wear a face mask and wash your hands often to prevent spread of the virus.  Use your inhaler more if you have asthma.  Do not use any tobacco products, including cigarettes, chewing tobacco, or electronic cigarettes. If you need help quitting, ask your doctor. Contact a doctor if:  You are getting worse, not better.  Your  symptoms are not helped by medicine.  You have chills.  You are getting more short of breath.  You have brown or red mucus.  You have yellow or brown discharge from your nose.  You have pain in your face, especially when you bend forward.  You have a fever.  You have puffy (swollen) neck glands.  You have pain while swallowing.  You have white areas in the back of your throat. Get help right away if:  You have very bad or constant: ? Headache. ? Ear pain. ? Pain in your forehead, behind your eyes, and over your cheekbones (sinus pain). ? Chest pain.  You have long-lasting (chronic) lung disease and any of the following: ? Wheezing. ? Long-lasting cough. ? Coughing up blood. ? A change in your usual mucus.  You have a stiff neck.  You have changes in your: ? Vision. ? Hearing. ? Thinking. ? Mood. This information is not intended to replace advice given to you by your health care provider. Make sure you discuss any questions you have with your health care provider. Document Released: 12/27/2007 Document Revised: 03/12/2016 Document Reviewed: 10/15/2013 Elsevier Interactive Patient Education  2018 Elsevier Inc.  

## 2017-08-18 NOTE — Telephone Encounter (Signed)
Phone call to patient. Left detailed message on home phone with message from provider, instructed to call back if any further questions.

## 2017-08-18 NOTE — Telephone Encounter (Signed)
Do not take Fenofibrate. Contraindicated if you're taking a statin per recent news. Thanks.

## 2017-10-09 DIAGNOSIS — M76899 Other specified enthesopathies of unspecified lower limb, excluding foot: Secondary | ICD-10-CM | POA: Insufficient documentation

## 2017-10-15 LAB — HM DIABETES EYE EXAM

## 2017-11-06 ENCOUNTER — Encounter: Payer: Self-pay | Admitting: Internal Medicine

## 2017-11-06 ENCOUNTER — Ambulatory Visit: Payer: BLUE CROSS/BLUE SHIELD | Admitting: Internal Medicine

## 2017-11-06 VITALS — BP 148/74 | HR 99 | Ht 70.0 in | Wt 228.6 lb

## 2017-11-06 DIAGNOSIS — E1159 Type 2 diabetes mellitus with other circulatory complications: Secondary | ICD-10-CM | POA: Diagnosis not present

## 2017-11-06 DIAGNOSIS — E1165 Type 2 diabetes mellitus with hyperglycemia: Secondary | ICD-10-CM

## 2017-11-06 DIAGNOSIS — E785 Hyperlipidemia, unspecified: Secondary | ICD-10-CM

## 2017-11-06 LAB — POCT GLYCOSYLATED HEMOGLOBIN (HGB A1C): Hemoglobin A1C: 6.4

## 2017-11-06 MED ORDER — GLIPIZIDE 5 MG PO TABS: 5.0000 mg | ORAL_TABLET | Freq: Every day | ORAL | 5 refills | Status: DC

## 2017-11-06 NOTE — Patient Instructions (Addendum)
Please continue: - Metformin 1000 mg 2x a day, with meals - Farxiga 5 mg in am. - Trulicity 1.5 mg weekly  Use Glipizide 5 mg only before a larger meal.  Please return in 3 months with your sugar log.

## 2017-11-06 NOTE — Progress Notes (Signed)
Patient ID: [name] Love, male   DOB: Nov 14, 1955, 62 y.o.   MRN: [id number]   HPI: [name] Love is a 62 y.o.-year-old male, initially referred by his PCP, Dr. Mitchel Honour, returning for follow-up for DM2, dx in ~2008, non-insulin-dependent, uncontrolled, with long term complications (CAD - s/p CABG in 2007, CKD, PN).  Last visit 3 months ago.  Last hemoglobin A1c was: Lab Results  Component Value Date   HGBA1C 8.2 06/21/2017   HGBA1C 9.5 03/21/2017   HGBA1C 8.8 07/27/2016  He got a steroid inj in shoulder >> 05/10/2017.  Pt is on a regimen of: - Metformin 1000 mg 2x a day, with meals - Glipizide ER 10 >> 5 mg before breakfast - cuts tablet in half-  - Farxiga 5 mg in am. - Trulicity 1.5 mg weekly -started 05/2017-no nausea vomiting constipation.  He checks his sugars frequently with his freestyle libre CGM.  We downloaded the reports and will scan them in the patient's chart.  Lows (<70): 0% >> 7% >> 0% In range (80-180): 34% >> 85% >> 86% Highs (>180): 66% >> 8% >> 14%  Ave 204 +/- 57.1 >> 124 +/- 37.8 >> 139 +/-35.7:  CBG 25-75%: - 6-10 am: 65-100 >> 105-130 - am: 130-175 >> 65-100 >> see above - 2h after b'fast: 100-130 >> 110-150 - lunch: 130-160 (close to b'fast) >> 120-150 - 2h after lunch: 125-175 >> 150-180 - dinner: 175-320 >> 100-130 >> 110-140 - 2h after dinner: 115-154 >> 120-160 - bedtime: 190-280 >> see above Lowest sugar was 108 >> 50's >> 58 x1 at night (? Cause); it is unclear at which level he has hypoglycemia awareness. Highest sugar was 300s >> 241 >> 254 x1 after lunch.  Pt's meals are: - Breakfast: bagel, OJ - Lunch: soup or sandwich or leftovers - Dinner: meat, veggies - Snacks: not daily  - + CKD, last BUN/creatinine:  Lab Results  Component Value Date   BUN 18 06/21/2017   BUN 25 05/29/2017   CREATININE 1.27 06/21/2017   CREATININE 1.48 (H) 05/29/2017  On losartan 50. -+ HL; last set of lipids: Lab Results  Component  Value Date   CHOL 98 (L) 06/21/2017   HDL 26 (L) 06/21/2017   LDLCALC 33 06/21/2017   LDLDIRECT 51 06/21/2017   TRIG 195 (H) 06/21/2017   CHOLHDL 3.8 06/21/2017  On Zocor 40, omega-3 fatty acids 2000 mg twice a day - last eye exam was in 03/2017: No DR. Dr. Alois Cliche. - + numbness and tingling in his feet. On ASA 81.  ROS: Constitutional: no weight gain/no weight loss, no fatigue, no subjective hyperthermia, no subjective hypothermia Eyes: no blurry vision, no xerophthalmia ENT: no sore throat, no nodules palpated in throat, no dysphagia, no odynophagia, no hoarseness Cardiovascular: no CP/no SOB/no palpitations/no leg swelling Respiratory: no cough/no SOB/no wheezing Gastrointestinal: no N/no V/no D/no C/no acid reflux Musculoskeletal: no muscle aches/no joint aches Skin: no rashes, no hair loss Neurological: no tremors/+ numbness/+ tingling/no dizziness  I reviewed pt's medications, allergies, PMH, social hx, family hx, and changes were documented in the history of present illness. Otherwise, unchanged from my initial visit note.  Past Medical History:  Diagnosis Date  . Coronary artery disease    post CABG x3 in 2007  . Diabetes mellitus   . Hyperlipidemia   . Hypertension   . Obesity    Past Surgical History:  Procedure Laterality Date  . CARDIAC CATHETERIZATION  EF= 45-50%   EF= 45-50% -- Three-vessel coronary  artery disease with high-grade lesions in the proximal left anterior descending artery and multiple lesions in the right coronary -- Low normal ejection fraction -- Stable exertional angina -- [name], M.D. [facility]  . CORONARY ARTERY BYPASS GRAFT  02/19/2006     x3 (left internal mammary artery to LAD, left radial artery to posterior descending, saphenous vein graft to first diagonal), endoscopic vein harvest right leg -- SURGEON:  [name]. [name], M.D.  . HERNIA REPAIR     at 62 y/o  . tripple bypass     Social History   Socioeconomic History  .  Marital status: Married    Spouse name: Not on file  . Number of children: 3  Social Needs  Occupational History  . unemployed  Tobacco Use  . Smoking status: Never Smoker  . Smokeless tobacco: Never Used  Substance and Sexual Activity  . Alcohol use: Yes    Alcohol/week:     Types: 1-2 per mo  . Drug use: No  .    Other Topics   .   Social History Narrative  .    Current Outpatient Medications on File Prior to Visit  Medication Sig Dispense Refill  . albuterol (PROVENTIL HFA;VENTOLIN HFA) 108 (90 Base) MCG/ACT inhaler Inhale 1-2 puffs into the lungs every 4 (four) hours as needed for wheezing or shortness of breath. 1 Inhaler 0  . aspirin 325 MG EC tablet Take 162.5 mg by mouth daily.     Marland Kitchen atorvastatin (LIPITOR) 40 MG tablet Take 1 tablet (40 mg total) by mouth daily. 90 tablet 3  . benzonatate (TESSALON) 200 MG capsule Take 1 capsule (200 mg total) by mouth 2 (two) times daily as needed for cough. 20 capsule 0  . cetirizine (ZYRTEC) 10 MG tablet Take 1 tablet (10 mg total) by mouth daily. 30 tablet 11  . Continuous Blood Gluc Sensor (FREESTYLE LIBRE 14 DAY SENSOR) MISC 1 each by Does not apply route every 14 (fourteen) days. Change every 2 weeks 2 each 11  . dapagliflozin propanediol (FARXIGA) 5 MG TABS tablet Take 5 mg by mouth daily. 90 tablet 3  . Dulaglutide (TRULICITY) 1.5 RW/4.3XV SOPN Inject 1.5 mg into the skin once a week 4 pen 5  . fenofibrate 160 MG tablet TAKE 1 TABLET BY MOUTH ONCE DAILY AS NEEDED; NEEDS OFFICE VISIT FOR FURTHER REFILLS. 30 tablet 0  . glipiZIDE (GLUCOTROL XL) 5 MG 24 hr tablet Take 1 tablet (5 mg total) by mouth daily with breakfast. 90 tablet 1  . losartan (COZAAR) 50 MG tablet Take 1 tablet (50 mg total) by mouth daily. 90 tablet 3  . metFORMIN (GLUCOPHAGE) 1000 MG tablet Take 1 tablet (1,000 mg total) by mouth 2 (two) times daily with a meal. 180 tablet 6  . Omega-3 Fatty Acids (FISH OIL PO) Take 2,000 mg by mouth 2 (two) times daily.      Marland Kitchen  OVER THE COUNTER MEDICATION 665 mg.    . POTASSIUM GLUCONATE PO Take 595 mg by mouth.    . promethazine-codeine (PHENERGAN WITH CODEINE) 6.25-10 MG/5ML syrup Take 5 mLs by mouth at bedtime as needed for cough. 120 mL 0  . sildenafil (VIAGRA) 100 MG tablet Take 1 tablet (100 mg total) by mouth daily as needed for erectile dysfunction. 12 tablet 11  . triamcinolone (NASACORT) 55 MCG/ACT AERO nasal inhaler Place 2 sprays into the nose daily. 1 Inhaler 12   No current facility-administered medications on file prior to visit.  No Known Allergies Family History  Problem Relation Age of Onset  . Heart disease Mother   . Cancer Father     PE: BP (!) 148/74   Pulse 99   Ht 5\' 10"  (1.778 m)   Wt 228 lb 9.6 oz (103.7 kg)   SpO2 97%   BMI 32.80 kg/m  Wt Readings from Last 3 Encounters:  11/06/17 228 lb 9.6 oz (103.7 kg)  08/17/17 229 lb (103.9 kg)  08/09/17 231 lb 12.8 oz (105.1 kg)   Constitutional: overweight, in NAD Eyes: PERRLA, EOMI, no exophthalmos ENT: moist mucous membranes, no thyromegaly, no cervical lymphadenopathy Cardiovascular: tachycardia, RR, No MRG Respiratory: CTA B Gastrointestinal: abdomen soft, NT, ND, BS+ Musculoskeletal: no deformities, strength intact in all 4 Skin: moist, warm, no rashes Neurological: no tremor with outstretched hands, DTR normal in all 4  ASSESSMENT: 1. DM2, non-insulin-dependent, uncontrolled, without complications - CAD - s/p CABG in 2007 - CKD - PN  2. HL  PLAN:  1. Patient with long-standing, uncontrolled, type 2 diabetes, on oral antidiabetic regimen and GLP-1 receptor agonist with improved control after he started Trulicity.  No side effects from this medication.  At last visit, we decreased his glipizide ER as he was having lower blood sugars after breakfast.  We also adjusted his diet: Suggested to stop juice and bagels in the morning due to the high glycemic index. - sugars are mostly at or close to goal, except higher sugars  in last week >> forgot Trulicity - no pattrens noticed. Afetr dinner, there is more variability in his sugars - we discussed to stop Glipizide except take a low dose before a larger meal - He continues to use the freestyle libre CGM >> downloaded today - will scan reports - I suggested to:  Patient Instructions  Please continue: - Metformin 1000 mg 2x a day, with meals - Farxiga 5 mg in am. - Trulicity 1.5 mg weekly  Use Glipizide 5 mg only before a larger meal.  Please return in 3 months with your sugar log.   - today, HbA1c is 6.4% (better!) - continue checking sugars at different times of the day - check 1x a day, rotating checks - advised for yearly eye exams >> he is UTD - Return to clinic in 3 mo with sugar log   2.  Hyperlipidemia - Reviewed latest lipid panel from 05/2017: LDL quite low, HDL, also low, triglycerides slightly high - Continues the statin without side effects.  He is also on omega-3 fatty acids.  Philemon Kingdom, MD PhD [facility] End[facility]in[facility]

## 2017-12-02 ENCOUNTER — Encounter: Payer: Self-pay | Admitting: Internal Medicine

## 2018-01-14 ENCOUNTER — Other Ambulatory Visit: Payer: Self-pay | Admitting: Internal Medicine

## 2018-03-21 ENCOUNTER — Encounter: Payer: Self-pay | Admitting: Internal Medicine

## 2018-03-21 ENCOUNTER — Ambulatory Visit: Payer: BLUE CROSS/BLUE SHIELD | Admitting: Internal Medicine

## 2018-03-21 VITALS — BP 130/80 | HR 82 | Ht 70.0 in | Wt 225.4 lb

## 2018-03-21 DIAGNOSIS — E785 Hyperlipidemia, unspecified: Secondary | ICD-10-CM | POA: Diagnosis not present

## 2018-03-21 DIAGNOSIS — E118 Type 2 diabetes mellitus with unspecified complications: Secondary | ICD-10-CM | POA: Diagnosis not present

## 2018-03-21 DIAGNOSIS — E1165 Type 2 diabetes mellitus with hyperglycemia: Secondary | ICD-10-CM

## 2018-03-21 DIAGNOSIS — IMO0002 Reserved for concepts with insufficient information to code with codable children: Secondary | ICD-10-CM

## 2018-03-21 DIAGNOSIS — E1159 Type 2 diabetes mellitus with other circulatory complications: Secondary | ICD-10-CM | POA: Diagnosis not present

## 2018-03-21 LAB — POCT GLYCOSYLATED HEMOGLOBIN (HGB A1C): Hemoglobin A1C: 6.9 % — AB (ref 4.0–5.6)

## 2018-03-21 MED ORDER — DULAGLUTIDE 1.5 MG/0.5ML ~~LOC~~ SOAJ: SUBCUTANEOUS | 3 refills | Status: DC

## 2018-03-21 MED ORDER — METFORMIN HCL 1000 MG PO TABS: 1000.0000 mg | ORAL_TABLET | Freq: Two times a day (BID) | ORAL | 3 refills | Status: DC

## 2018-03-21 MED ORDER — DAPAGLIFLOZIN PROPANEDIOL 10 MG PO TABS: 10.0000 mg | ORAL_TABLET | Freq: Every day | ORAL | 3 refills | Status: DC

## 2018-03-21 MED ORDER — GLIPIZIDE 5 MG PO TABS: 5.0000 mg | ORAL_TABLET | Freq: Every day | ORAL | 5 refills | Status: DC

## 2018-03-21 NOTE — Progress Notes (Signed)
Patient ID: [name] Love, male   DOB: Dec 17, 1955, 62 y.o.   MRN: [id number]   HPI: [name] Love is a 62 y.o.-year-old male, initially referred by his PCP, Dr. Mitchel Honour, returning for follow-up for DM2, dx in ~2008, non-insulin-dependent, uncontrolled, with long term complications (CAD - s/p CABG in 2007, CKD, PN).  Last visit 4 months ago.  Last hemoglobin A1c was: Lab Results  Component Value Date   HGBA1C 6.4 11/06/2017   HGBA1C 8.2 06/21/2017   HGBA1C 9.5 03/21/2017  He got a steroid inj in shoulder >> 05/10/2017.  Pt is on a regimen of: - Metformin 1000 mg 2x a day, with meals - Glipizide ER 10 >> 5 mg before breakfast - cuts tablet in half-advised him at last visit to only use this with a larger meal - Farxiga 5 mg in am. - Trulicity 1.5 mg weekly -started 05/2017-no nausea vomiting constipation.  He checks his sugars frequently with his freestyle libre CGM.  We downloaded the report and we will scan them in the patient's chart:  Lows (<70): 0% >> 7% >> 0% >> 0% In range (80-180): 34% >> 85% >> 86% >> 82% Highs (>180): 66% >> 8% >> 14% >> 18%  Ave 204 +/- 57.1 >> 124 +/- 37.8 >> 139 +/-35.7 >> 153+/-34.2, Coefficient of variation 22.4% (19 to 25%)  CBG 25-75%: - 6-10 am: 65-100 >> 105-130 >> 100-130 - am: 130-175 >> 65-100 >> see above - 2h after b'fast: 100-130 >> 110-150 >> 150-220 - lunch: 130-160 (close to b'fast) >> 120-150 >> 120-180 - 2h after lunch: 125-175 >> 150-180 >> 130-180 - dinner: 175-320 >> 100-130 >> 110-140 >> 140-170 - 2h after dinner: 115-154 >> 120-160 >> 140-170 - bedtime: 190-280 >> see above Lowest sugar was 108 >> 50's >> 58 x1 at night (? Cause) >> 97; it is unclear at which level he has hypoglycemia awareness. Highest sugar was 254 x1 after lunch >> 248  Pt's meals are: - Breakfast: bagel, OJ >> cereals - Lunch: soup or sandwich or leftovers - Dinner: meat, veggies - Snacks: not daily  -+ CKD, last BUN/creatinine:   Lab Results  Component Value Date   BUN 18 06/21/2017   BUN 25 05/29/2017   CREATININE 1.27 06/21/2017   CREATININE 1.48 (H) 05/29/2017  On losartan 50. -+ HL; last set of lipids: Lab Results  Component Value Date   CHOL 98 (L) 06/21/2017   HDL 26 (L) 06/21/2017   LDLCALC 33 06/21/2017   LDLDIRECT 51 06/21/2017   TRIG 195 (H) 06/21/2017   CHOLHDL 3.8 06/21/2017  On Zocor 40, omega-3 fatty acids 2000 mg twice a day  - last eye exam was in 03/2017: No DR. Dr. Alois Cliche. - + numbness and tingling in his feet. On ASA 81.  He is preparing to go to [place] next mo.  ROS: Constitutional: no weight gain/no weight loss, no fatigue, no subjective hyperthermia, no subjective hypothermia, + nocturia Eyes: no blurry vision, no xerophthalmia ENT: no sore throat, no nodules palpated in throat, no dysphagia, no odynophagia, no hoarseness Cardiovascular: no CP/no SOB/no palpitations/no leg swelling Respiratory: no cough/no SOB/no wheezing Gastrointestinal: no N/no V/+ D/no C/no acid reflux Musculoskeletal: + muscle aches/no joint aches Skin: no rashes, no hair loss Neurological: no tremors/+ numbness/+ tingling/no dizziness  I reviewed pt's medications, allergies, PMH, social hx, family hx, and changes were documented in the history of present illness. Otherwise, unchanged from my initial visit note.  Past Medical History:  Diagnosis Date  .  Coronary artery disease    post CABG x3 in 2007  . Diabetes mellitus   . Hyperlipidemia   . Hypertension   . Obesity    Past Surgical History:  Procedure Laterality Date  . CARDIAC CATHETERIZATION  EF= 45-50%   EF= 45-50% -- Three-vessel coronary artery disease with high-grade lesions in the proximal left anterior descending artery and multiple lesions in the right coronary -- Low normal ejection fraction -- Stable exertional angina -- [name], M.D. [facility]  . CORONARY ARTERY BYPASS GRAFT  02/19/2006     x3 (left internal mammary artery to  LAD, left radial artery to posterior descending, saphenous vein graft to first diagonal), endoscopic vein harvest right leg -- SURGEON:  [name]. [name], M.D.  . HERNIA REPAIR     at 62 y/o  . tripple bypass     Social History   Socioeconomic History  . Marital status: Married    Spouse name: Not on file  . Number of children: 3  Social Needs  Occupational History  . unemployed  Tobacco Use  . Smoking status: Never Smoker  . Smokeless tobacco: Never Used  Substance and Sexual Activity  . Alcohol use: Yes    Alcohol/week:     Types: 1-2 per mo  . Drug use: No  .    Other Topics   .   Social History Narrative  .    Current Outpatient Medications on File Prior to Visit  Medication Sig Dispense Refill  . aspirin 325 MG EC tablet Take 162.5 mg by mouth daily.     Marland Kitchen atorvastatin (LIPITOR) 40 MG tablet Take 1 tablet (40 mg total) by mouth daily. 90 tablet 3  . cetirizine (ZYRTEC) 10 MG tablet Take 1 tablet (10 mg total) by mouth daily. 30 tablet 11  . Continuous Blood Gluc Sensor (FREESTYLE LIBRE 14 DAY SENSOR) MISC 1 each by Does not apply route every 14 (fourteen) days. Change every 2 weeks 2 each 11  . glipiZIDE (GLUCOTROL) 5 MG tablet Take 1 tablet (5 mg total) by mouth daily. 30 tablet 5  . losartan (COZAAR) 50 MG tablet Take 1 tablet (50 mg total) by mouth daily. 90 tablet 3  . metFORMIN (GLUCOPHAGE) 1000 MG tablet Take 1 tablet (1,000 mg total) by mouth 2 (two) times daily with a meal. 180 tablet 6  . Omega-3 Fatty Acids (FISH OIL PO) Take 2,000 mg by mouth 2 (two) times daily.      Marland Kitchen OVER THE COUNTER MEDICATION 665 mg.    . POTASSIUM GLUCONATE PO Take 595 mg by mouth.    . promethazine-codeine (PHENERGAN WITH CODEINE) 6.25-10 MG/5ML syrup Take 5 mLs by mouth at bedtime as needed for cough. 120 mL 0  . sildenafil (VIAGRA) 100 MG tablet Take 1 tablet (100 mg total) by mouth daily as needed for erectile dysfunction. 12 tablet 11  . TRULICITY 1.5 NG/2.9BM SOPN INJECT 1.5  MG INTO THE SKIN ONCE A WEEK 4 pen 5  . albuterol (PROVENTIL HFA;VENTOLIN HFA) 108 (90 Base) MCG/ACT inhaler Inhale 1-2 puffs into the lungs every 4 (four) hours as needed for wheezing or shortness of breath. (Patient not taking: Reported on 03/21/2018) 1 Inhaler 0  . benzonatate (TESSALON) 200 MG capsule Take 1 capsule (200 mg total) by mouth 2 (two) times daily as needed for cough. (Patient not taking: Reported on 03/21/2018) 20 capsule 0  . triamcinolone (NASACORT) 55 MCG/ACT AERO nasal inhaler Place 2 sprays into the nose daily. (Patient not  taking: Reported on 03/21/2018) 1 Inhaler 12   No current facility-administered medications on file prior to visit.    No Known Allergies Family History  Problem Relation Age of Onset  . Heart disease Mother   . Cancer Father     PE: BP 130/80   Pulse 82   Ht 5\' 10"  (1.778 m)   Wt 225 lb 6.4 oz (102.2 kg)   SpO2 96%   BMI 32.34 kg/m  Wt Readings from Last 3 Encounters:  03/21/18 225 lb 6.4 oz (102.2 kg)  11/06/17 228 lb 9.6 oz (103.7 kg)  08/17/17 229 lb (103.9 kg)   Constitutional: overweight, in NAD Eyes: PERRLA, EOMI, no exophthalmos ENT: moist mucous membranes, no thyromegaly, no cervical lymphadenopathy Cardiovascular: RRR, No MRG Respiratory: CTA B Gastrointestinal: abdomen soft, NT, ND, BS+ Musculoskeletal: no deformities, strength intact in all 4 Skin: moist, warm, no rashes Neurological: no tremor with outstretched hands, DTR normal in all 4  ASSESSMENT: 1. DM2, non-insulin-dependent, uncontrolled, without complications - CAD - s/p CABG in 2007 - CKD - PN  2. HL  PLAN:  1. Patient with long-standing, uncontrolled, type 2 diabetes, on oral antidiabetic regimen and GLP-1 receptor agonist, with improved control after he started Trulicity.  He has no nausea or vomiting from the medication.  Last visit, I advised him to only use glipizide for a larger meal.  I also suggested to stop juice and bagel in the morning due to high  glycemic index.  Sugars were mostly at goal at that time.  After dinner, he had more variability his sugars and we discussed about improving this meal. -At this visit, his sugars are only slightly higher, with 2 peaks: At bedtime and after breakfast.  The reason for the peaks are: Snack before bedtime (I advised him to try to reduce snacking at night), and cereal for breakfast (I advised him to experiment with other breakfast foods).  Otherwise, sugars are at goal in the morning and slightly higher around lunchtime and dinnertime but with good control before he snacks at bedtime.  We will increase his Farxiga from 5 mg to 10 mg and continue to use glipizide only as needed. -He continues to use his freestyle libre CGM which was downloaded today and we reviewed the reports together. - I suggested to:  Patient Instructions  Please continue: - Metformin 1000 mg 2x a day, with meals - Glipizide 5 mg only before a larger meal - Trulicity 1.5 mg weekly  Please increase: - Farxiga 10 mg in am.  Please return in 4 months with your sugar log.  - today, HbA1c is 6.9% (higher) - continue checking sugars at different times of the day - check 1x a day, rotating checks - advised for yearly eye exams >> he is UTD - Return to clinic in 3 mo with sugar log   2.  Hyperlipidemia - Reviewed latest lipid panel from 05/2017: LDL at goal, triglycerides high, HDL low Lab Results  Component Value Date   CHOL 98 (L) 06/21/2017   HDL 26 (L) 06/21/2017   LDLCALC 33 06/21/2017   LDLDIRECT 51 06/21/2017   TRIG 195 (H) 06/21/2017   CHOLHDL 3.8 06/21/2017  - Continues the statin without side effects.  She is also on omega-3 fatty acids  Philemon Kingdom, MD PhD [facility] Endocrinology

## 2018-03-21 NOTE — Patient Instructions (Addendum)
Please continue: - Metformin 1000 mg 2x a day, with meals - Glipizide 5 mg only before a larger meal - Trulicity 1.5 mg weekly  Please increase: - Farxiga 10 mg in am.  Please return in 4 months with your sugar log.

## 2018-03-21 NOTE — Addendum Note (Signed)
Addended by: Drucilla Schmidt on: 03/21/2018 12:58 PM   Modules accepted: Orders

## 2018-04-06 ENCOUNTER — Other Ambulatory Visit: Payer: Self-pay | Admitting: Emergency Medicine

## 2018-04-06 DIAGNOSIS — I10 Essential (primary) hypertension: Secondary | ICD-10-CM

## 2018-05-07 ENCOUNTER — Other Ambulatory Visit: Payer: Self-pay | Admitting: Emergency Medicine

## 2018-05-07 DIAGNOSIS — I10 Essential (primary) hypertension: Secondary | ICD-10-CM

## 2018-05-07 NOTE — Telephone Encounter (Signed)
Called pt using [place] ID # [phone], Billie Ruddy [place]).  No answer, left message for pt to call and schedule an appointment for his refills.

## 2018-05-07 NOTE — Telephone Encounter (Signed)
Please review for refills.

## 2018-05-10 ENCOUNTER — Telehealth: Payer: Self-pay | Admitting: Emergency Medicine

## 2018-05-10 NOTE — Telephone Encounter (Signed)
Copied from [place] [phone]. Topic: Quick Communication - Rx Refill/Question >> May 10, 2018  6:03 PM Blase Mess A wrote: Medication: losartan (COZAAR) 50 MG tablet [[id number]]  Patient will run out of meds on 05/15/18 He has an appt scheduled of 05/27/18  Has the patient contacted their pharmacy? Yes  (Agent: If no, request that the patient contact the pharmacy for the refill.) (Agent: If yes, when and what did the pharmacy advise?)  Preferred Pharmacy (with phone number or street name): [facility], [place] [address] Phone: [phone] Fax: [phone]    Agent: Please be advised that RX refills may take up to 3 business days. We ask that you follow-up with your pharmacy.

## 2018-05-12 ENCOUNTER — Other Ambulatory Visit: Payer: Self-pay

## 2018-05-12 DIAGNOSIS — I10 Essential (primary) hypertension: Secondary | ICD-10-CM

## 2018-05-12 MED ORDER — LOSARTAN POTASSIUM 50 MG PO TABS: 50.0000 mg | ORAL_TABLET | Freq: Every day | ORAL | 0 refills | Status: DC

## 2018-05-27 ENCOUNTER — Other Ambulatory Visit: Payer: Self-pay

## 2018-05-27 ENCOUNTER — Encounter: Payer: Self-pay | Admitting: Emergency Medicine

## 2018-05-27 ENCOUNTER — Ambulatory Visit (INDEPENDENT_AMBULATORY_CARE_PROVIDER_SITE_OTHER): Payer: BLUE CROSS/BLUE SHIELD | Admitting: Emergency Medicine

## 2018-05-27 VITALS — BP 143/72 | HR 87 | Temp 98.0°F | Resp 18 | Ht 71.0 in | Wt 222.4 lb

## 2018-05-27 DIAGNOSIS — I10 Essential (primary) hypertension: Secondary | ICD-10-CM

## 2018-05-27 DIAGNOSIS — Z0001 Encounter for general adult medical examination with abnormal findings: Secondary | ICD-10-CM | POA: Diagnosis not present

## 2018-05-27 DIAGNOSIS — E785 Hyperlipidemia, unspecified: Secondary | ICD-10-CM

## 2018-05-27 DIAGNOSIS — Z23 Encounter for immunization: Secondary | ICD-10-CM | POA: Diagnosis not present

## 2018-05-27 DIAGNOSIS — Z1159 Encounter for screening for other viral diseases: Secondary | ICD-10-CM

## 2018-05-27 DIAGNOSIS — Z0000 Encounter for general adult medical examination without abnormal findings: Secondary | ICD-10-CM

## 2018-05-27 DIAGNOSIS — E1165 Type 2 diabetes mellitus with hyperglycemia: Secondary | ICD-10-CM | POA: Diagnosis not present

## 2018-05-27 DIAGNOSIS — Z114 Encounter for screening for human immunodeficiency virus [HIV]: Secondary | ICD-10-CM

## 2018-05-27 LAB — POCT GLYCOSYLATED HEMOGLOBIN (HGB A1C): Hemoglobin A1C: 6.8 % — AB (ref 4.0–5.6)

## 2018-05-27 LAB — GLUCOSE, POCT (MANUAL RESULT ENTRY): POC Glucose: 165 mg/dl — AB (ref 70–99)

## 2018-05-27 MED ORDER — LOSARTAN POTASSIUM 50 MG PO TABS: 50.0000 mg | ORAL_TABLET | Freq: Every day | ORAL | 3 refills | Status: DC

## 2018-05-27 MED ORDER — ATORVASTATIN CALCIUM 40 MG PO TABS: 40.0000 mg | ORAL_TABLET | Freq: Every day | ORAL | 3 refills | Status: DC

## 2018-05-27 NOTE — Progress Notes (Signed)
[name] Love 62 y.o.   Chief Complaint  Patient presents with  . Annual Exam    HISTORY OF PRESENT ILLNESS: This is a 62 y.o. male Here for annual exam; no complaints and no medical concerns. Has a history of diabetes on multiple medications.  Sees an endocrinologist for that. Also has a history of hypertension dyslipidemia, on medication.  Needs refills.   Lab Results  Component Value Date   HGBA1C 6.9 (A) 03/21/2018   BP Readings from Last 3 Encounters:  05/27/18 (!) 143/72  03/21/18 130/80  11/06/17 (!) 148/74   Wt Readings from Last 3 Encounters:  05/27/18 222 lb 6.4 oz (100.9 kg)  03/21/18 225 lb 6.4 oz (102.2 kg)  11/06/17 228 lb 9.6 oz (103.7 kg)    HPI   Prior to Admission medications   Medication Sig Start Date End Date Taking? Authorizing Provider  albuterol (PROVENTIL HFA;VENTOLIN HFA) 108 (90 Base) MCG/ACT inhaler Inhale 1-2 puffs into the lungs every 4 (four) hours as needed for wheezing or shortness of breath. 04/09/17  Yes [name], MD  aspirin 325 MG EC tablet Take 162.5 mg by mouth daily.    Yes [provider]  atorvastatin (LIPITOR) 40 MG tablet Take 1 tablet (40 mg total) by mouth daily. 08/09/17  Yes [name], MD  Continuous Blood Gluc Sensor (FREESTYLE LIBRE 14 DAY SENSOR) MISC 1 each by Does not apply route every 14 (fourteen) days. Change every 2 weeks 08/08/17  Yes Philemon Kingdom, MD  dapagliflozin propanediol (FARXIGA) 10 MG TABS tablet Take 10 mg by mouth daily. 03/21/18  Yes Philemon Kingdom, MD  Dulaglutide (TRULICITY) 1.5 EH/2.0NO SOPN INJECT 1.5 MG INTO THE SKIN ONCE A WEEK 03/21/18  Yes Philemon Kingdom, MD  glipiZIDE (GLUCOTROL) 5 MG tablet Take 1 tablet (5 mg total) by mouth daily. 03/21/18  Yes Philemon Kingdom, MD  losartan (COZAAR) 50 MG tablet Take 1 tablet (50 mg total) by mouth daily. Keep appt 05/27/2018 for further refills 05/12/18  Yes Permelia Bamba, [name], MD  metFORMIN (GLUCOPHAGE) 1000 MG  tablet Take 1 tablet (1,000 mg total) by mouth 2 (two) times daily with a meal. 03/21/18  Yes Philemon Kingdom, MD  Omega-3 Fatty Acids (FISH OIL PO) Take 2,000 mg by mouth 2 (two) times daily.     Yes [provider]  OVER THE COUNTER MEDICATION 665 mg.   Yes [provider]  POTASSIUM GLUCONATE PO Take 595 mg by mouth.   Yes [provider]  promethazine-codeine (PHENERGAN WITH CODEINE) 6.25-10 MG/5ML syrup Take 5 mLs by mouth at bedtime as needed for cough. 08/17/17  Yes Jalexis Breed, [name], MD  sildenafil (VIAGRA) 100 MG tablet Take 1 tablet (100 mg total) by mouth daily as needed for erectile dysfunction. 08/31/16  Yes Kamori Kitchens, [name], MD  triamcinolone (NASACORT) 55 MCG/ACT AERO nasal inhaler Place 2 sprays into the nose daily. 08/17/17  Yes Cecil Vandyke, [name], MD  benzonatate (TESSALON) 200 MG capsule Take 1 capsule (200 mg total) by mouth 2 (two) times daily as needed for cough. Patient not taking: Reported on 03/21/2018 07/25/17   [name], MD  cetirizine (ZYRTEC) 10 MG tablet Take 1 tablet (10 mg total) by mouth daily. Patient not taking: Reported on 05/27/2018 08/17/17   [name], MD    No Known Allergies  Patient Active Problem List   Diagnosis Date Noted  . Rhinorrhea 08/17/2017  . Decreased calculated GFR 08/08/2017  . Cough 07/25/2017  . Viral  upper respiratory illness 07/25/2017  . Chest congestion 07/25/2017  . Acute upper respiratory infection 03/21/2017  . Erectile dysfunction 09/15/2013  . Nonspecific abnormal electrocardiogram (ECG) (EKG) 06/10/2013  . Poorly controlled type 2 diabetes mellitus with circulatory disorder ([place]) 09/12/2012  . Coronary artery disease 07/14/2011  . History of coronary artery bypass surgery 07/14/2011  . Hyperlipidemia 02/23/2009  . Obesity 02/23/2009  . Essential hypertension 02/23/2009    Past Medical History:  Diagnosis Date  . Coronary artery disease    post CABG x3 in 2007    . Diabetes mellitus   . Hyperlipidemia   . Hypertension   . Obesity     Past Surgical History:  Procedure Laterality Date  . CARDIAC CATHETERIZATION  EF= 45-50%   EF= 45-50% -- Three-vessel coronary artery disease with high-grade lesions in the proximal left anterior descending artery and multiple lesions in the right coronary -- Low normal ejection fraction -- Stable exertional angina -- [name], M.D. [facility]  . CORONARY ARTERY BYPASS GRAFT  02/19/2006     x3 (left internal mammary artery to LAD, left radial artery to posterior descending, saphenous vein graft to first diagonal), endoscopic vein harvest right leg -- SURGEON:  [name]. [name], M.D.  . HERNIA REPAIR     at 62 y/o  . tripple bypass      Social History   Socioeconomic History  . Marital status: Married    Spouse name: Not on file  . Number of children: Not on file  . Years of education: Not on file  . Highest education level: Not on file  Occupational History  . Not on file  Social Needs  . Financial resource strain: Not on file  . Food insecurity:    Worry: Not on file    Inability: Not on file  . Transportation needs:    Medical: Not on file    Non-medical: Not on file  Tobacco Use  . Smoking status: Never Smoker  . Smokeless tobacco: Never Used  Substance and Sexual Activity  . Alcohol use: Yes    Alcohol/week: 1.0 standard drinks    Types: 1 Standard drinks or equivalent per week  . Drug use: No  . Sexual activity: Yes  Lifestyle  . Physical activity:    Days per week: Not on file    Minutes per session: Not on file  . Stress: Not on file  Relationships  . Social connections:    Talks on phone: Not on file    Gets together: Not on file    Attends religious service: Not on file    Active member of club or organization: Not on file    Attends meetings of clubs or organizations: Not on file    Relationship status: Not on file  . Intimate partner violence:    Fear of current or ex  partner: Not on file    Emotionally abused: Not on file    Physically abused: Not on file    Forced sexual activity: Not on file  Other Topics Concern  . Not on file  Social History Narrative  . Not on file    Family History  Problem Relation Age of Onset  . Heart disease Mother   . Cancer Father      Review of Systems  Constitutional: Negative.  Negative for chills, fever and weight loss.  HENT: Negative.   Eyes: Negative.   Respiratory: Negative.  Negative for cough and shortness of breath.   Cardiovascular: Negative.  Negative for chest pain.  Gastrointestinal: Negative.  Negative for abdominal pain, diarrhea, nausea and vomiting.  Genitourinary: Negative.  Negative for dysuria and hematuria.  Musculoskeletal: Negative.  Negative for back pain, myalgias and neck pain.  Skin: Negative.  Negative for rash.  Neurological: Negative.  Negative for dizziness and headaches.  Endo/Heme/Allergies: Negative.   All other systems reviewed and are negative.    Vitals:   05/27/18 1410  BP: (!) 143/72  Pulse: 87  Resp: 18  Temp: 98 F (36.7 C)  SpO2: 96%    Physical Exam  Constitutional: He is oriented to person, place, and time. He appears well-developed and well-nourished.  HENT:  Head: Normocephalic and atraumatic.  Nose: Nose normal.  Mouth/Throat: Oropharynx is clear and moist.  Eyes: Pupils are equal, round, and reactive to light. Conjunctivae and EOM are normal.  Neck: Normal range of motion. Neck supple.  Cardiovascular: Normal rate, regular rhythm and normal heart sounds.  Pulmonary/Chest: Effort normal and breath sounds normal.  Abdominal: Soft. There is no tenderness.  Musculoskeletal: Normal range of motion.  Neurological: He is alert and oriented to person, place, and time. No sensory deficit. He exhibits normal muscle tone. Coordination normal.  Skin: Skin is warm and dry. Capillary refill takes less than 2 seconds.  Psychiatric: He has a normal mood and  affect. His behavior is normal.  Vitals reviewed.  Results for orders placed or performed in visit on 05/27/18 (from the past 24 hour(s))  POCT glucose (manual entry)     Status: Abnormal   Collection Time: 05/27/18  2:46 PM  Result Value Ref Range   POC Glucose 165 (A) 70 - 99 mg/dl  POCT glycosylated hemoglobin (Hb A1C)     Status: Abnormal   Collection Time: 05/27/18  2:52 PM  Result Value Ref Range   Hemoglobin A1C 6.8 (A) 4.0 - 5.6 %   HbA1c POC (<> result, manual entry)     HbA1c, POC (prediabetic range)     HbA1c, POC (controlled diabetic range)       ASSESSMENT & PLAN: Jaman was seen today for annual exam.  Diagnoses and all orders for this visit:  Routine general medical examination at a health care facility -     Pneumococcal Polysaccharide 23  Flu vaccine need -     Flu Vaccine QUAD 36+ mos IM  Essential hypertension -     Comprehensive metabolic panel -     losartan (COZAAR) 50 MG tablet; Take 1 tablet (50 mg total) by mouth daily.  Type 2 diabetes mellitus with hyperglycemia, without long-term current use of insulin (HCC) -     Comprehensive metabolic panel -     POCT glucose (manual entry) -     POCT glycosylated hemoglobin (Hb A1C) -     Pneumococcal Polysaccharide 23 -     atorvastatin (LIPITOR) 40 MG tablet; Take 1 tablet (40 mg total) by mouth daily.  Dyslipidemia -     Lipid panel -     atorvastatin (LIPITOR) 40 MG tablet; Take 1 tablet (40 mg total) by mouth daily.  Need for hepatitis C screening test -     Hepatitis C antibody screen  Screening for HIV (human immunodeficiency virus) -     HIV antibody     Patient Instructions       If you have lab work done today you will be contacted with your lab results within the next 2 weeks.  If you have not heard from [place]  then please contact us. The fastest way to get your results is to register for My Chart.   IF you received an x-ray today, you will receive an invoice from [facility]  Radiology. Please contact [facility] Radiology at [phone] with questions or concerns regarding your invoice.   IF you received labwork today, you will receive an invoice from [facility]. Please contact [facility] at [phone] with questions or concerns regarding your invoice.   Our billing staff will not be able to assist you with questions regarding bills from these companies.  You will be contacted with the lab results as soon as they are available. The fastest way to get your results is to activate your My Chart account. Instructions are located on the last page of this paperwork. If you have not heard from [place] regarding the results in 2 weeks, please contact this office.      Health Maintenance, Male A healthy lifestyle and preventive care is important for your health and wellness. Ask your health care provider about what schedule of regular examinations is right for you. What should I know about weight and diet? Eat a Healthy Diet  Eat plenty of vegetables, fruits, whole grains, low-fat dairy products, and lean protein.  Do not eat a lot of foods high in solid fats, added sugars, or salt.  Maintain a Healthy Weight Regular exercise can help you achieve or maintain a healthy weight. You should:  Do at least 150 minutes of exercise each week. The exercise should increase your heart rate and make you sweat (moderate-intensity exercise).  Do strength-training exercises at least twice a week.  Watch Your Levels of Cholesterol and Blood Lipids  Have your blood tested for lipids and cholesterol every 5 years starting at 62 years of age. If you are at high risk for heart disease, you should start having your blood tested when you are 62 years old. You may need to have your cholesterol levels checked more often if: ? Your lipid or cholesterol levels are high. ? You are older than 62 years of age. ? You are at high risk for heart disease.  What should I know about cancer  screening? Many types of cancers can be detected early and may often be prevented. Lung Cancer  You should be screened every year for lung cancer if: ? You are a current smoker who has smoked for at least 30 years. ? You are a former smoker who has quit within the past 15 years.  Talk to your health care provider about your screening options, when you should start screening, and how often you should be screened.  Colorectal Cancer  Routine colorectal cancer screening usually begins at 62 years of age and should be repeated every 5-10 years until you are 62 years old. You may need to be screened more often if early forms of precancerous polyps or small growths are found. Your health care provider may recommend screening at an earlier age if you have risk factors for colon cancer.  Your health care provider may recommend using home test kits to check for hidden blood in the stool.  A small camera at the end of a tube can be used to examine your colon (sigmoidoscopy or colonoscopy). This checks for the earliest forms of colorectal cancer.  Prostate and Testicular Cancer  Depending on your age and overall health, your health care provider may do certain tests to screen for prostate and testicular cancer.  Talk to your health care provider about any  symptoms or concerns you have about testicular or prostate cancer.  Skin Cancer  Check your skin from head to toe regularly.  Tell your health care provider about any new moles or changes in moles, especially if: ? There is a change in a mole's size, shape, or color. ? You have a mole that is larger than a pencil eraser.  Always use sunscreen. Apply sunscreen liberally and repeat throughout the day.  Protect yourself by wearing long sleeves, pants, a wide-brimmed hat, and sunglasses when outside.  What should I know about heart disease, diabetes, and high blood pressure?  If you are 25-65 years of age, have your blood pressure checked  every 3-5 years. If you are 79 years of age or older, have your blood pressure checked every year. You should have your blood pressure measured twice-once when you are at a hospital or clinic, and once when you are not at a hospital or clinic. Record the average of the two measurements. To check your blood pressure when you are not at a hospital or clinic, you can use: ? An automated blood pressure machine at a pharmacy. ? A home blood pressure monitor.  Talk to your health care provider about your target blood pressure.  If you are between 85-80 years old, ask your health care provider if you should take aspirin to prevent heart disease.  Have regular diabetes screenings by checking your fasting blood sugar level. ? If you are at a normal weight and have a low risk for diabetes, have this test once every three years after the age of 11. ? If you are overweight and have a high risk for diabetes, consider being tested at a younger age or more often.  A one-time screening for abdominal aortic aneurysm (AAA) by ultrasound is recommended for men aged 38-75 years who are current or former smokers. What should I know about preventing infection? Hepatitis B If you have a higher risk for hepatitis B, you should be screened for this virus. Talk with your health care provider to find out if you are at risk for hepatitis B infection. Hepatitis C Blood testing is recommended for:  Everyone born from [birth year] through 1965.  Anyone with known risk factors for hepatitis C.  Sexually Transmitted Diseases (STDs)  You should be screened each year for STDs including gonorrhea and chlamydia if: ? You are sexually active and are younger than 62 years of age. ? You are older than 62 years of age and your health care provider tells you that you are at risk for this type of infection. ? Your sexual activity has changed since you were last screened and you are at an increased risk for chlamydia or gonorrhea. Ask your  health care provider if you are at risk.  Talk with your health care provider about whether you are at high risk of being infected with HIV. Your health care provider may recommend a prescription medicine to help prevent HIV infection.  What else can I do?  Schedule regular health, dental, and eye exams.  Stay current with your vaccines (immunizations).  Do not use any tobacco products, such as cigarettes, chewing tobacco, and e-cigarettes. If you need help quitting, ask your health care provider.  Limit alcohol intake to no more than 2 drinks per day. One drink equals 12 ounces of beer, 5 ounces of wine, or 1 ounces of hard liquor.  Do not use street drugs.  Do not share needles.  Ask your health care  provider for help if you need support or information about quitting drugs.  Tell your health care provider if you often feel depressed.  Tell your health care provider if you have ever been abused or do not feel safe at home. This information is not intended to replace advice given to you by your health care provider. Make sure you discuss any questions you have with your health care provider. Document Released: 01/06/2008 Document Revised: 03/08/2016 Document Reviewed: 04/13/2015 Elsevier Interactive Patient Education  2018 [employer]     Agustina Caroli, MD [facility]

## 2018-05-27 NOTE — Patient Instructions (Addendum)

## 2018-05-28 LAB — COMPREHENSIVE METABOLIC PANEL
ALT: 25 IU/L (ref 0–44)
AST: 22 IU/L (ref 0–40)
Albumin/Globulin Ratio: 2.4 — ABNORMAL HIGH (ref 1.2–2.2)
Albumin: 4.7 g/dL (ref 3.6–4.8)
Alkaline Phosphatase: 50 IU/L (ref 39–117)
BUN/Creatinine Ratio: 15 (ref 10–24)
BUN: 14 mg/dL (ref 8–27)
Bilirubin Total: 0.3 mg/dL (ref 0.0–1.2)
CO2: 20 mmol/L (ref 20–29)
Calcium: 9.6 mg/dL (ref 8.6–10.2)
Chloride: 103 mmol/L (ref 96–106)
Creatinine, Ser: 0.94 mg/dL (ref 0.76–1.27)
GFR calc Af Amer: 101 mL/min/{1.73_m2} (ref >59)
GFR calc non Af Amer: 87 mL/min/{1.73_m2} (ref >59)
Globulin, Total: 2 g/dL (ref 1.5–4.5)
Glucose: 155 mg/dL — ABNORMAL HIGH (ref 65–99)
Potassium: 4.6 mmol/L (ref 3.5–5.2)
Sodium: 144 mmol/L (ref 134–144)
Total Protein: 6.7 g/dL (ref 6.0–8.5)

## 2018-05-28 LAB — LIPID PANEL
Chol/HDL Ratio: 3.3 ratio (ref 0.0–5.0)
Cholesterol, Total: 104 mg/dL (ref 100–199)
HDL: 32 mg/dL — ABNORMAL LOW (ref >39)
LDL Calculated: 24 mg/dL (ref 0–99)
Triglycerides: 238 mg/dL — ABNORMAL HIGH (ref 0–149)
VLDL Cholesterol Cal: 48 mg/dL — ABNORMAL HIGH (ref 5–40)

## 2018-05-28 LAB — HIV ANTIBODY (ROUTINE TESTING W REFLEX): HIV Screen 4th Generation wRfx: NONREACTIVE

## 2018-05-28 LAB — HEPATITIS C ANTIBODY: Hep C Virus Ab: <0.1 s/co ratio (ref 0.0–0.9)

## 2018-05-29 ENCOUNTER — Encounter: Payer: Self-pay | Admitting: *Deleted

## 2018-07-10 ENCOUNTER — Encounter: Payer: Self-pay | Admitting: Internal Medicine

## 2018-07-10 ENCOUNTER — Ambulatory Visit: Payer: BLUE CROSS/BLUE SHIELD | Admitting: Internal Medicine

## 2018-07-10 VITALS — BP 138/60 | HR 88 | Ht 71.0 in | Wt 225.0 lb

## 2018-07-10 DIAGNOSIS — E1165 Type 2 diabetes mellitus with hyperglycemia: Secondary | ICD-10-CM

## 2018-07-10 DIAGNOSIS — Z6832 Body mass index (BMI) 32.0-32.9, adult: Secondary | ICD-10-CM

## 2018-07-10 DIAGNOSIS — E785 Hyperlipidemia, unspecified: Secondary | ICD-10-CM

## 2018-07-10 DIAGNOSIS — E1159 Type 2 diabetes mellitus with other circulatory complications: Secondary | ICD-10-CM | POA: Diagnosis not present

## 2018-07-10 DIAGNOSIS — Z23 Encounter for immunization: Secondary | ICD-10-CM

## 2018-07-10 DIAGNOSIS — E669 Obesity, unspecified: Secondary | ICD-10-CM

## 2018-07-10 LAB — POCT GLYCOSYLATED HEMOGLOBIN (HGB A1C): Hemoglobin A1C: 6.5 % — AB (ref 4.0–5.6)

## 2018-07-10 NOTE — Progress Notes (Signed)
Patient ID: [name] Love, male   DOB: Mar 07, 1956, 62 y.o.   MRN: [id number]   HPI: [name] Love is a 62 y.o.-year-old male, initially referred by his PCP, Dr. Mitchel Honour, returning for follow-up for DM2, dx in ~2008, non-insulin-dependent, uncontrolled, with long term complications (CAD - s/p CABG in 2007, CKD, PN, DR).  Last visit 4 months ago.  Last hemoglobin A1c was: Lab Results  Component Value Date   HGBA1C 6.8 (A) 05/27/2018   HGBA1C 6.9 (A) 03/21/2018   HGBA1C 6.4 11/06/2017   HGBA1C 8.2 06/21/2017   HGBA1C 9.5 03/21/2017   HGBA1C 8.8 07/27/2016   HGBA1C 9.3 10/08/2015   HGBA1C 8.6 09/24/2014   HGBA1C 7.0 09/15/2013   HGBA1C 7.2 05/22/2013   HGBA1C 6.7 09/12/2012   HGBA1C 6.7 04/23/2012   HGBA1C 7.3 01/16/2012   He got a steroid inj in shoulder >> 05/10/2017.  Pt is on a regimen of: - Metformin 1000 mg 2x a day, with meals - Glipizide ER 10 >> 5 mg before a larger meal (1/2 tablet)  - increased back to 10 mg-he tolerates this well, without increased urination, dizziness - Farxiga 5 >> 10 mg in am. - Trulicity 1.5 mg weekly -started 05/2017  He checks his sugars frequently with his freestyle libre CGM.  We downloaded and reviewed the report.  We will scanned him. Ave: 153+/-34.2 >> 137 +/- 46.7 Coefficient of variation: 22.4% >> 34.1 (19 to 25%)  Lows (<70):0% >> 0% >> 0% In range (80-180): 86% >> 82% >> 86% Highs (>180): 14% >> 18% >> 14%  CBG 25-75%:  Lowest sugar was 108 >> 50's >> 58 x1 at night (? Cause) >> 97 >> 88; it is unclear at which level he has hypoglycemia awareness.   Highest sugar was 254 x1 after lunch >> 248 >> 300s - tooth implant 2 weeks ago.  Pt's meals are: - Breakfast: bagel, OJ >> cereals - Lunch: soup or sandwich or leftovers - Dinner: meat, veggies - Snacks: not daily  -+ CKD, last BUN/creatinine:  Lab Results  Component Value Date   BUN 14 05/27/2018   BUN 18 06/21/2017   CREATININE 0.94 05/27/2018   CREATININE 1.27 06/21/2017  On losartan 50. -+ HL; last set of lipids: Lab Results  Component Value Date   CHOL 104 05/27/2018   HDL 32 (L) 05/27/2018   LDLCALC 24 05/27/2018   LDLDIRECT 51 06/21/2017   TRIG 238 (H) 05/27/2018   CHOLHDL 3.3 05/27/2018  On Zocor 40, omega-3 fatty acids 2000 mg twice a day. - last eye exam was in 09/2017: + DR. Dr. Alois Cliche. -He has numbness and tingling in his feet. On ASA 81.  He is preparing to go to [place] next mo.  ROS: Constitutional: no weight gain/no weight loss, no fatigue, no subjective hyperthermia, no subjective hypothermia Eyes: no blurry vision, no xerophthalmia ENT: no sore throat, no nodules palpated in neck, no dysphagia, no odynophagia, no hoarseness Cardiovascular: no CP/no SOB/no palpitations/no leg swelling Respiratory: no cough/no SOB/no wheezing Gastrointestinal: no N/no V/+ D/no C/no acid reflux Musculoskeletal:+ muscle aches/no joint aches Skin: no rashes, no hair loss Neurological: no tremors/no numbness/no tingling/no dizziness  I reviewed pt's medications, allergies, PMH, social hx, family hx, and changes were documented in the history of present illness. Otherwise, unchanged from my initial visit note.  Past Medical History:  Diagnosis Date  . Coronary artery disease    post CABG x3 in 2007  . Diabetes mellitus   . Hyperlipidemia   .  Hypertension   . Obesity    Past Surgical History:  Procedure Laterality Date  . CARDIAC CATHETERIZATION  EF= 45-50%   EF= 45-50% -- Three-vessel coronary artery disease with high-grade lesions in the proximal left anterior descending artery and multiple lesions in the right coronary -- Low normal ejection fraction -- Stable exertional angina -- [name], M.D. [facility]  . CORONARY ARTERY BYPASS GRAFT  02/19/2006     x3 (left internal mammary artery to LAD, left radial artery to posterior descending, saphenous vein graft to first diagonal), endoscopic vein harvest right leg --  SURGEON:  [name]. [name], M.D.  . HERNIA REPAIR     at 63 y/o  . tripple bypass     Social History   Socioeconomic History  . Marital status: Married    Spouse name: Not on file  . Number of children: 3  Social Needs  Occupational History  . unemployed  Tobacco Use  . Smoking status: Never Smoker  . Smokeless tobacco: Never Used  Substance and Sexual Activity  . Alcohol use: Yes    Alcohol/week:     Types: 1-2 per mo  . Drug use: No  .    Other Topics   .   Social History Narrative  .    Current Outpatient Medications on File Prior to Visit  Medication Sig Dispense Refill  . albuterol (PROVENTIL HFA;VENTOLIN HFA) 108 (90 Base) MCG/ACT inhaler Inhale 1-2 puffs into the lungs every 4 (four) hours as needed for wheezing or shortness of breath. 1 Inhaler 0  . aspirin 325 MG EC tablet Take 162.5 mg by mouth daily.     [name] Love atorvastatin (LIPITOR) 40 MG tablet Take 1 tablet (40 mg total) by mouth daily. 90 tablet 3  . benzonatate (TESSALON) 200 MG capsule Take 1 capsule (200 mg total) by mouth 2 (two) times daily as needed for cough. (Patient not taking: Reported on 03/21/2018) 20 capsule 0  . cetirizine (ZYRTEC) 10 MG tablet Take 1 tablet (10 mg total) by mouth daily. (Patient not taking: Reported on 05/27/2018) 30 tablet 11  . Continuous Blood Gluc Sensor (FREESTYLE LIBRE 14 DAY SENSOR) MISC 1 each by Does not apply route every 14 (fourteen) days. Change every 2 weeks 2 each 11  . dapagliflozin propanediol (FARXIGA) 10 MG TABS tablet Take 10 mg by mouth daily. 90 tablet 3  . Dulaglutide (TRULICITY) 1.5 YT/0.1SW SOPN INJECT 1.5 MG INTO THE SKIN ONCE A WEEK 12 pen 3  . glipiZIDE (GLUCOTROL) 5 MG tablet Take 1 tablet (5 mg total) by mouth daily. 30 tablet 5  . losartan (COZAAR) 50 MG tablet Take 1 tablet (50 mg total) by mouth daily. 90 tablet 3  . metFORMIN (GLUCOPHAGE) 1000 MG tablet Take 1 tablet (1,000 mg total) by mouth 2 (two) times daily with a meal. 180 tablet 3  . Omega-3  Fatty Acids (FISH OIL PO) Take 2,000 mg by mouth 2 (two) times daily.      [name] Love OVER THE COUNTER MEDICATION 665 mg.    . POTASSIUM GLUCONATE PO Take 595 mg by mouth.    . promethazine-codeine (PHENERGAN WITH CODEINE) 6.25-10 MG/5ML syrup Take 5 mLs by mouth at bedtime as needed for cough. 120 mL 0  . sildenafil (VIAGRA) 100 MG tablet Take 1 tablet (100 mg total) by mouth daily as needed for erectile dysfunction. 12 tablet 11  . triamcinolone (NASACORT) 55 MCG/ACT AERO nasal inhaler Place 2 sprays into the nose daily. 1 Inhaler 12  No current facility-administered medications on file prior to visit.    No Known Allergies Family History  Problem Relation Age of Onset  . Heart disease Mother   . Cancer Father     PE: BP 138/60   Pulse 88   Ht 5\' 11"  (1.803 m)   Wt 225 lb (102.1 kg)   SpO2 97%   BMI 31.38 kg/m  Wt Readings from Last 3 Encounters:  07/10/18 225 lb (102.1 kg)  05/27/18 222 lb 6.4 oz (100.9 kg)  03/21/18 225 lb 6.4 oz (102.2 kg)   Constitutional: overweight, in NAD Eyes: PERRLA, EOMI, no exophthalmos ENT: moist mucous membranes, no thyromegaly, no cervical lymphadenopathy Cardiovascular: RRR, No MRG Respiratory: CTA B Gastrointestinal: abdomen soft, NT, ND, BS+ Musculoskeletal: no deformities, strength intact in all 4 Skin: moist, warm, no rashes Neurological: no tremor with outstretched hands, DTR normal in all 4  ASSESSMENT: 1. DM2, non-insulin-dependent, uncontrolled, without complications - CAD - s/p CABG in 2007 - CKD - PN - DR  2. HL  3. Obesity  PLAN:  1. Patient with longstanding, he previously uncontrolled, type 2 diabetes, on oral antidiabetic regimen and GLP-1 receptor agonist, with improved control after addition of Trulicity.  He tolerates this well.  We were able to decrease glipizide to only take it before a larger meal.  At last visit, sugars were higher especially at bedtime and breakfast.  He was having a snack before bedtime, which I  advised him to stop as cereal for breakfast, which we discussed about replacing.  Otherwise, sugars are at goal.  We increased farxiga from 5 to 10 mg and continued the rest of the medications. -We reviewed the report of his freestyle libre CGM together-we will scan them -Sugars appear improved especially in the last 2 weeks.  At the beginning of this month he had high blood sugars at night and after lunch and he mentions that this is when he had tooth implant placed. -He had a recent HbA1c last month which was slightly better, at 6.8%.  HbA1c was rechecked today by mistake and this was even better, at 6.5%. -We do not need to change his regimen for now, however, he is telling me that he is taking a full glipizide XL tablet before a larger meal and I advised him to only take half dose of benefit from the instant release property of this - I suggested to:  Patient Instructions  Please continue: - Metformin 1000 mg 2x a day, with meals - Trulicity 1.5 mg weekly - Farxiga 10 mg before b'fast  If you have a larger meal, take only 5 mg Glipizide before the meal.  Please return in 4 months with your sugar log.  - continue checking sugars at different times of the day - check sugars frequently with his freestyle libre CGM - advised for yearly eye exams >> he is UTD - We will give him pneumonia shot today - Return to clinic in 4 mo with sugar log    2.  Hyperlipidemia - Reviewed latest lipid panel from 05/2018: LDL at goal, HDL low, TG high Lab Results  Component Value Date   CHOL 104 05/27/2018   HDL 32 (L) 05/27/2018   LDLCALC 24 05/27/2018   LDLDIRECT 51 06/21/2017   TRIG 238 (H) 05/27/2018   CHOLHDL 3.3 05/27/2018  - Continues the statin without side effects. Also on omega 3 FAs  3.  Obesity -No weight loss since last visit -Continue SGLT 2 inhibitor and GLP-1  receptor agonist which should both help with weight loss, also  Philemon Kingdom, MD PhD [facility] Endocrinology

## 2018-07-10 NOTE — Patient Instructions (Addendum)
Please continue: - Metformin 1000 mg 2x a day, with meals - Trulicity 1.5 mg weekly - Farxiga 10 mg before b'fast  If you have a larger meal, take only 5 mg Glipizide before the meal.  Please return in 4 months with your sugar log.

## 2018-07-10 NOTE — Addendum Note (Signed)
Addended by: Cardell Peach I on: 07/10/2018 11:21 AM   Modules accepted: Orders

## 2018-07-11 ENCOUNTER — Encounter: Payer: Self-pay | Admitting: Internal Medicine

## 2018-07-29 ENCOUNTER — Other Ambulatory Visit: Payer: Self-pay | Admitting: Internal Medicine

## 2018-07-29 ENCOUNTER — Telehealth: Payer: Self-pay | Admitting: Internal Medicine

## 2018-07-29 NOTE — Progress Notes (Signed)
Cardiology Office Note    Date:  07/30/2018   ID:  [name], Love [birth year], MRN [id number]  PCP:  [name], MD  Cardiologist: Larae Grooms, MD EPS: None  Chief Complaint  Patient presents with  . Follow-up    History of Present Illness:  [name] Love is a 63 y.o. male with history of CAD status post CABG x3 in 2007, low risk nuclear stress test 2012.  Last echo 2014 EF 55 to 60% mild focal basal hypertrophy.  Also has hypertension, hyperlipidemia, abnormal EKG with T wave changes in the past, obesity, DM.  Last saw Dr. Irish Lack 07/2017 and was doing well.  Patient comes in for regular check up.  Overall feels well without chest pain, dyspnea, dyspnea on exertion, dizziness, or presyncope.  Does not exercise regularly but bowls once a week.  Has torn hamstrings from playing volleyball for 20 years and has trouble walking but can do the elliptical.    Past Medical History:  Diagnosis Date  . Coronary artery disease    post CABG x3 in 2007  . Diabetes mellitus   . Hyperlipidemia   . Hypertension   . Obesity     Past Surgical History:  Procedure Laterality Date  . CARDIAC CATHETERIZATION  EF= 45-50%   EF= 45-50% -- Three-vessel coronary artery disease with high-grade lesions in the proximal left anterior descending artery and multiple lesions in the right coronary -- Low normal ejection fraction -- Stable exertional angina -- [name], M.D. [facility]  . CORONARY ARTERY BYPASS GRAFT  02/19/2006     x3 (left internal mammary artery to LAD, left radial artery to posterior descending, saphenous vein graft to first diagonal), endoscopic vein harvest right leg -- SURGEON:  [name]. [name], M.D.  . HERNIA REPAIR     at 63 y/o  . tripple bypass      Current Medications: Current Meds  Medication Sig  . albuterol (PROVENTIL HFA;VENTOLIN HFA) 108 (90 Base) MCG/ACT inhaler Inhale 1-2 puffs into the lungs every 4 (four) hours as  needed for wheezing or shortness of breath.  Marland Kitchen atorvastatin (LIPITOR) 40 MG tablet Take 1 tablet (40 mg total) by mouth daily.  . benzonatate (TESSALON) 200 MG capsule Take 1 capsule (200 mg total) by mouth 2 (two) times daily as needed for cough.  . cetirizine (ZYRTEC) 10 MG tablet Take 1 tablet (10 mg total) by mouth daily.  . Continuous Blood Gluc Sensor (FREESTYLE LIBRE 14 DAY SENSOR) MISC USE AS DIRECTED TO CHECK GLUCOSE. CHANGE SENSOR EVERY 2 WEEKS.  . dapagliflozin propanediol (FARXIGA) 10 MG TABS tablet Take 10 mg by mouth daily.  . Dulaglutide (TRULICITY) 1.5 AO/1.3YQ SOPN INJECT 1.5 MG INTO THE SKIN ONCE A WEEK  . glipiZIDE (GLUCOTROL) 5 MG tablet Take 1 tablet (5 mg total) by mouth daily.  Marland Kitchen losartan (COZAAR) 50 MG tablet Take 1 tablet (50 mg total) by mouth daily.  . metFORMIN (GLUCOPHAGE) 1000 MG tablet Take 1 tablet (1,000 mg total) by mouth 2 (two) times daily with a meal.  . OVER THE COUNTER MEDICATION 665 mg.  . POTASSIUM GLUCONATE PO Take 595 mg by mouth.  . promethazine-codeine (PHENERGAN WITH CODEINE) 6.25-10 MG/5ML syrup Take 5 mLs by mouth at bedtime as needed for cough.  . triamcinolone (NASACORT) 55 MCG/ACT AERO nasal inhaler Place 2 sprays into the nose daily.  . [DISCONTINUED] aspirin 325 MG EC tablet Take 162.5 mg by mouth daily.   . [DISCONTINUED] Omega-3 Fatty Acids (FISH OIL  PO) Take 2,000 mg by mouth 2 (two) times daily.       Allergies:   Patient has no known allergies.   Social History   Socioeconomic History  . Marital status: Married    Spouse name: Not on file  . Number of children: Not on file  . Years of education: Not on file  . Highest education level: Not on file  Occupational History  . Not on file  Social Needs  . Financial resource strain: Not on file  . Food insecurity:    Worry: Not on file    Inability: Not on file  . Transportation needs:    Medical: Not on file    Non-medical: Not on file  Tobacco Use  . Smoking status: Never  Smoker  . Smokeless tobacco: Never Used  Substance and Sexual Activity  . Alcohol use: Yes    Alcohol/week: 1.0 standard drinks    Types: 1 Standard drinks or equivalent per week  . Drug use: No  . Sexual activity: Yes  Lifestyle  . Physical activity:    Days per week: Not on file    Minutes per session: Not on file  . Stress: Not on file  Relationships  . Social connections:    Talks on phone: Not on file    Gets together: Not on file    Attends religious service: Not on file    Active member of club or organization: Not on file    Attends meetings of clubs or organizations: Not on file    Relationship status: Not on file  Other Topics Concern  . Not on file  Social History Narrative  . Not on file     Family History:  The patient's family history includes Cancer in his father; Heart disease in his mother.   ROS:   Please see the history of present illness.    Review of Systems  Constitution: Negative.  HENT: Negative.   Cardiovascular: Negative.   Respiratory: Negative.   Endocrine: Negative.   Hematologic/Lymphatic: Negative.   Skin: Positive for rash.       Has seen dermatologist- for rash  Musculoskeletal: Negative.   Gastrointestinal: Positive for diarrhea.  Genitourinary: Negative.   Neurological: Negative.    All other systems reviewed and are negative.   PHYSICAL EXAM:   VS:  BP 132/66   Pulse 84   Ht 5\' 11"  (1.803 m)   Wt 222 lb 1.9 oz (100.8 kg)   SpO2 97%   BMI 30.98 kg/m   Physical Exam  GEN: Well nourished, well developed, in no acute distress  Neck: no JVD, carotid bruits, or masses Cardiac:RRR; no murmurs, rubs, or gallops  Respiratory:  clear to auscultation bilaterally, normal work of breathing GI: soft, nontender, nondistended, + BS Ext: without cyanosis, clubbing, or edema, Good distal pulses bilaterally Neuro:  Alert and Oriented x 3 Psych: euthymic mood, full affect  Wt Readings from Last 3 Encounters:  07/30/18 222 lb 1.9 oz  (100.8 kg)  07/10/18 225 lb (102.1 kg)  05/27/18 222 lb 6.4 oz (100.9 kg)      Studies/Labs Reviewed:   EKG:  EKG is ordered today.  The ekg ordered today demonstrates normal sinus rhythm with ST-T wave inversion anterior lateral similar to prior EKGs  Recent Labs: 05/27/2018: ALT 25; BUN 14; Creatinine, Ser 0.94; Potassium 4.6; Sodium 144   Lipid Panel    Component Value Date/Time   CHOL 104 05/27/2018 1458   TRIG 238 (H)  05/27/2018 1458   HDL 32 (L) 05/27/2018 1458   CHOLHDL 3.3 05/27/2018 1458   CHOLHDL 5.4 (H) 10/08/2015 1051   VLDL 76 (H) 10/08/2015 1051   LDLCALC 24 05/27/2018 1458   LDLDIRECT 51 06/21/2017 1036    Additional studies/ records that were reviewed today include:  Echo in 2014: Left ventricle: The cavity size was normal. There was mild   focal basal hypertrophy of the septum. Systolic function   was normal. The estimated ejection fraction was in the   range of 55% to 60%. Wall motion was normal; there were no   regional wall motion abnormalities. - Left atrium: The atrium was mildly dilated.       ASSESSMENT:    1. Coronary artery disease involving native coronary artery of native heart without angina pectoris   2. Essential hypertension   3. Hyperlipidemia, unspecified hyperlipidemia type   4. Poorly controlled type 2 diabetes mellitus with circulatory disorder [facility])      PLAN:  In order of problems listed above:  CAD status post CABG x3 in 2007, nuclear stress test 2012 stable.  No angina.  Abnormal EKG similar to prior EKGs.  Recommend 20 to 30 minutes of exercise daily.  Continue aspirin 81 mg and Lipitor.  Hypertension blood pressure well controlled  Hyperlipidemia LDL 24 and triglycerides 238 05/27/2018.  Recommend Vascepa 2 g twice daily if he can afford it with his insurance.  If he does he can stop over-the-counter fish oil.  Repeat lipids and liver in 3 to 4 months if he makes a change.  Obesity exercise and weight loss  recommended  Diabetes mellitus hemoglobin A1c came down to 6.5    Medication Adjustments/Labs and Tests Ordered: Current medicines are reviewed at length with the patient today.  Concerns regarding medicines are outlined above.  Medication changes, Labs and Tests ordered today are listed in the Patient Instructions below. Patient Instructions  Medication Instructions:  Your physician has recommended you make the following change in your medication:   1. DECREASE: aspirin to 81 mg once a day  2. STOP: OTC fish oil  3. START: vascepa 2 grams twice a day   If you need a refill on your cardiac medications before your next appointment, please call your pharmacy.   Lab work: Your physician recommends that you return for a FASTING lipid profile: in 3 months   If you have labs (blood work) drawn today and your tests are completely normal, you will receive your results only by: Marland Kitchen MyChart Message (if you have MyChart) OR . A paper copy in the mail If you have any lab test that is abnormal or we need to change your treatment, we will call you to review the results.  Testing/Procedures: None ordered  Follow-Up: At [facility], you and your health needs are our priority.  As part of our continuing mission to provide you with exceptional heart care, we have created designated Provider Care Teams.  These Care Teams include your primary Cardiologist (physician) and Advanced Practice Providers (APPs -  Physician Assistants and Nurse Practitioners) who all work together to provide you with the care you need, when you need it. . You will need a follow up appointment in 1 year.  Please call our office 2 months in advance to schedule this appointment.  You may see Casandra Doffing, MD or one of the following Advanced Practice Providers on your designated Care Team:   . Lyda Jester, PA-C . Dayna Dunn, PA-C .  Ermalinda Barrios, PA-C  Any Other Special Instructions Will Be Listed Below (If  Applicable).       Sumner Boast, PA-C  07/30/2018 12:57 PM    [facility] [place], [place], [place]  [address] Phone: [phone]; Fax: [phone]

## 2018-07-29 NOTE — Telephone Encounter (Signed)
Chart reviewed, this has already been sent.

## 2018-07-29 NOTE — Telephone Encounter (Signed)
MEDICATION: Continuous Blood Gluc Sensor (FREESTYLE LIBRE 14 DAY SENSOR) MISC  PHARMACY:  [facility] - [place], [place]  IS THIS A 90 DAY SUPPLY : 30 day    IS PATIENT OUT OF MEDICATION:   IF NOT; HOW MUCH IS LEFT: 16 hours   LAST APPOINTMENT DATE: @1 /12/2018  NEXT APPOINTMENT DATE:@4 /16/2020  DO WE HAVE YOUR PERMISSION TO LEAVE A DETAILED MESSAGE:  OTHER COMMENTS:    **Let patient know to contact pharmacy at the end of the day to make sure medication is ready. **  ** Please notify patient to allow 48-72 hours to process**  **Encourage patient to contact the pharmacy for refills or they can request refills through [facility]**

## 2018-07-30 ENCOUNTER — Encounter: Payer: Self-pay | Admitting: Physician Assistant

## 2018-07-30 ENCOUNTER — Ambulatory Visit (INDEPENDENT_AMBULATORY_CARE_PROVIDER_SITE_OTHER): Payer: BLUE CROSS/BLUE SHIELD | Admitting: Physician Assistant

## 2018-07-30 VITALS — BP 132/66 | HR 84 | Ht 71.0 in | Wt 222.1 lb

## 2018-07-30 DIAGNOSIS — E1165 Type 2 diabetes mellitus with hyperglycemia: Secondary | ICD-10-CM

## 2018-07-30 DIAGNOSIS — E785 Hyperlipidemia, unspecified: Secondary | ICD-10-CM

## 2018-07-30 DIAGNOSIS — E1159 Type 2 diabetes mellitus with other circulatory complications: Secondary | ICD-10-CM

## 2018-07-30 DIAGNOSIS — I10 Essential (primary) hypertension: Secondary | ICD-10-CM | POA: Diagnosis not present

## 2018-07-30 DIAGNOSIS — I2510 Atherosclerotic heart disease of native coronary artery without angina pectoris: Secondary | ICD-10-CM | POA: Diagnosis not present

## 2018-07-30 MED ORDER — ASPIRIN EC 81 MG PO TBEC: 81.0000 mg | DELAYED_RELEASE_TABLET | Freq: Every day | ORAL | 3 refills | Status: DC

## 2018-07-30 MED ORDER — ICOSAPENT ETHYL 1 G PO CAPS: 2.0000 g | ORAL_CAPSULE | Freq: Two times a day (BID) | ORAL | 3 refills | Status: DC

## 2018-07-30 NOTE — Patient Instructions (Signed)
Medication Instructions:  Your physician has recommended you make the following change in your medication:   1. DECREASE: aspirin to 81 mg once a day  2. STOP: OTC fish oil  3. START: vascepa 2 grams twice a day   If you need a refill on your cardiac medications before your next appointment, please call your pharmacy.   Lab work: Your physician recommends that you return for a FASTING lipid profile: in 3 months   If you have labs (blood work) drawn today and your tests are completely normal, you will receive your results only by: Marland Kitchen MyChart Message (if you have MyChart) OR . A paper copy in the mail If you have any lab test that is abnormal or we need to change your treatment, we will call you to review the results.  Testing/Procedures: None ordered  Follow-Up: At [facility], you and your health needs are our priority.  As part of our continuing mission to provide you with exceptional heart care, we have created designated Provider Care Teams.  These Care Teams include your primary Cardiologist (physician) and Advanced Practice Providers (APPs -  Physician Assistants and Nurse Practitioners) who all work together to provide you with the care you need, when you need it. . You will need a follow up appointment in 1 year.  Please call our office 2 months in advance to schedule this appointment.  You may see Casandra Doffing, MD or one of the following Advanced Practice Providers on your designated Care Team:   . Lyda Jester, PA-C . Dayna Dunn, PA-C . Ermalinda Barrios, PA-C  Any Other Special Instructions Will Be Listed Below (If Applicable).

## 2018-08-10 ENCOUNTER — Other Ambulatory Visit: Payer: Self-pay

## 2018-08-10 ENCOUNTER — Ambulatory Visit: Payer: BLUE CROSS/BLUE SHIELD | Admitting: Family Medicine

## 2018-08-10 ENCOUNTER — Encounter: Payer: Self-pay | Admitting: Family Medicine

## 2018-08-10 VITALS — BP 140/72 | HR 84 | Temp 97.4°F | Resp 16 | Ht 70.0 in | Wt 227.8 lb

## 2018-08-10 DIAGNOSIS — R059 Cough, unspecified: Secondary | ICD-10-CM

## 2018-08-10 DIAGNOSIS — J069 Acute upper respiratory infection, unspecified: Secondary | ICD-10-CM | POA: Diagnosis not present

## 2018-08-10 DIAGNOSIS — R05 Cough: Secondary | ICD-10-CM | POA: Diagnosis not present

## 2018-08-10 DIAGNOSIS — Z87898 Personal history of other specified conditions: Secondary | ICD-10-CM

## 2018-08-10 MED ORDER — HYDROCODONE-HOMATROPINE 5-1.5 MG/5ML PO SYRP: ORAL_SOLUTION | ORAL | 0 refills | Status: DC

## 2018-08-10 MED ORDER — BENZONATATE 100 MG PO CAPS: 100.0000 mg | ORAL_CAPSULE | Freq: Three times a day (TID) | ORAL | 0 refills | Status: DC | PRN

## 2018-08-10 MED ORDER — ALBUTEROL SULFATE HFA 108 (90 BASE) MCG/ACT IN AERS: 1.0000 | INHALATION_SPRAY | RESPIRATORY_TRACT | 0 refills | Status: DC | PRN

## 2018-08-10 NOTE — Patient Instructions (Addendum)
Cough appears to be from initial virus at this time.  I do not hear any wheeze on your exam, but reactive airway can also contribute to your current cough.  Try Tessalon Perles 3 times per day as needed.  If wheezing, or tight cough can try 1 to 2 puffs of the albuterol.  If that helps you can use it up to every 4-6 hours, but if you need that medication more than 2 to 3 days in a row, or persistently needed multiple times per day let me know as prednisone may be needed.   Hydrocodone cough syrup at night if needed for the cough.  Do not take that if you are short of breath or wheezing.  Instead use albuterol.  Return to the clinic or go to the nearest emergency room if any of your symptoms worsen or new symptoms occur.   Cough, Adult  Coughing is a reflex that clears your throat and your airways. Coughing helps to heal and protect your lungs. It is normal to cough occasionally, but a cough that happens with other symptoms or lasts a long time may be a sign of a condition that needs treatment. A cough may last only 2-3 weeks (acute), or it may last longer than 8 weeks (chronic). What are the causes? Coughing is commonly caused by:  Breathing in substances that irritate your lungs.  A viral or bacterial respiratory infection.  Allergies.  Asthma.  Postnasal drip.  Smoking.  Acid backing up from the stomach into the esophagus (gastroesophageal reflux).  Certain medicines.  Chronic lung problems, including COPD (or rarely, lung cancer).  Other medical conditions such as heart failure. Follow these instructions at home: Pay attention to any changes in your symptoms. Take these actions to help with your discomfort:  Take medicines only as told by your health care provider. ? If you were prescribed an antibiotic medicine, take it as told by your health care provider. Do not stop taking the antibiotic even if you start to feel better. ? Talk with your health care provider before you  take a cough suppressant medicine.  Drink enough fluid to keep your urine clear or pale yellow.  If the air is dry, use a cold steam vaporizer or humidifier in your bedroom or your home to help loosen secretions.  Avoid anything that causes you to cough at work or at home.  If your cough is worse at night, try sleeping in a semi-upright position.  Avoid cigarette smoke. If you smoke, quit smoking. If you need help quitting, ask your health care provider.  Avoid caffeine.  Avoid alcohol.  Rest as needed. Contact a health care provider if:  You have new symptoms.  You cough up pus.  Your cough does not get better after 2-3 weeks, or your cough gets worse.  You cannot control your cough with suppressant medicines and you are losing sleep.  You develop pain that is getting worse or pain that is not controlled with pain medicines.  You have a fever.  You have unexplained weight loss.  You have night sweats. Get help right away if:  You cough up blood.  You have difficulty breathing.  Your heartbeat is very fast. This information is not intended to replace advice given to you by your health care provider. Make sure you discuss any questions you have with your health care provider. Document Released: 01/06/2011 Document Revised: 12/16/2015 Document Reviewed: 09/16/2014 Elsevier Interactive Patient Education  2019 [employer].  If you have lab work done today you will be contacted with your lab results within the next 2 weeks.  If you have not heard from [place] then please contact us. The fastest way to get your results is to register for My Chart.   IF you received an x-ray today, you will receive an invoice from [facility]. Please contact [facility] at [phone] with questions or concerns regarding your invoice.   IF you received labwork today, you will receive an invoice from [place]. Please contact [facility] at [phone] with questions or  concerns regarding your invoice.   Our billing staff will not be able to assist you with questions regarding bills from these companies.  You will be contacted with the lab results as soon as they are available. The fastest way to get your results is to activate your My Chart account. Instructions are located on the last page of this paperwork. If you have not heard from [place] regarding the results in 2 weeks, please contact this office.

## 2018-08-10 NOTE — Progress Notes (Signed)
By signing my name below, I,Temidayo Atanda-Ogunleye, attest that this documentation has been prepared under the direction and in the presence of [name], MD. Electronically Signed: Stephania Fragmin, Scribe 08/10/2018 at 10:32 AM.  Subjective:  Patient ID: [name] Love, male    DOB: 09-02-1955  Age: 62 y.o. MRN: [id number]  CC:  Chief Complaint  Patient presents with  . Cough     just got over cild symptoms a week ago but can not get rid of the cough   HPI [name] Love is a 63 y.o. male that presents today with complaints of cough.  Cold symptoms started around 9 days ago with congestion, rhinorrhea, and cough (night>day, wakes him at night).  Denies fever, body aches, and chills. Occasional clear green expectorant in the past 4-5 days. Usually experiences a dry cough. Denies SOB, wheezing, or need for albuterol usage. Reports post nasal drip and increased nighttime coughing.  He has attempted to self treat with Robitussin DM.  History Patient Active Problem List   Diagnosis Date Noted  . Decreased calculated GFR 08/08/2017  . Erectile dysfunction 09/15/2013  . Nonspecific abnormal electrocardiogram (ECG) (EKG) 06/10/2013  . Poorly controlled type 2 diabetes mellitus with circulatory disorder ([place]) 09/12/2012  . Coronary artery disease 07/14/2011  . History of coronary artery bypass surgery 07/14/2011  . Hyperlipidemia 02/23/2009  . Obesity 02/23/2009  . Essential hypertension 02/23/2009   Past Medical History:  Diagnosis Date  . Coronary artery disease    post CABG x3 in 2007  . Diabetes mellitus   . Hyperlipidemia   . Hypertension   . Obesity    Past Surgical History:  Procedure Laterality Date  . CARDIAC CATHETERIZATION  EF= 45-50%   EF= 45-50% -- Three-vessel coronary artery disease with high-grade lesions in the proximal left anterior descending artery and multiple lesions in the right coronary -- Low normal ejection fraction --  Stable exertional angina -- [name], M.D. [facility]  . CORONARY ARTERY BYPASS GRAFT  02/19/2006     x3 (left internal mammary artery to LAD, left radial artery to posterior descending, saphenous vein graft to first diagonal), endoscopic vein harvest right leg -- SURGEON:  [name]. [name], M.D.  . HERNIA REPAIR     at 63 y/o  . tripple bypass     No Known Allergies Prior to Admission medications   Medication Sig Start Date End Date Taking? Authorizing Provider  albuterol (PROVENTIL HFA;VENTOLIN HFA) 108 (90 Base) MCG/ACT inhaler Inhale 1-2 puffs into the lungs every 4 (four) hours as needed for wheezing or shortness of breath. 04/09/17  Yes [name], MD  aspirin EC 81 MG tablet Take 1 tablet (81 mg total) by mouth daily. 07/30/18  Yes Imogene Burn, PA-C  atorvastatin (LIPITOR) 40 MG tablet Take 1 tablet (40 mg total) by mouth daily. 05/27/18  Yes [name], [name], MD  cetirizine (ZYRTEC) 10 MG tablet Take 1 tablet (10 mg total) by mouth daily. 08/17/17  Yes [name], [name], MD  Continuous Blood Gluc Sensor (FREESTYLE LIBRE 14 DAY SENSOR) MISC USE AS DIRECTED TO CHECK GLUCOSE. CHANGE SENSOR EVERY 2 WEEKS. 07/29/18  Yes Philemon Kingdom, MD  dapagliflozin propanediol (FARXIGA) 10 MG TABS tablet Take 10 mg by mouth daily. 03/21/18  Yes Philemon Kingdom, MD  Dulaglutide (TRULICITY) 1.5 OZ/3.6UY SOPN INJECT 1.5 MG INTO THE SKIN ONCE A WEEK 03/21/18  Yes Philemon Kingdom, MD  glipiZIDE (GLUCOTROL) 5 MG tablet Take 1 tablet (5 mg total) by mouth daily. 03/21/18  Yes Philemon Kingdom, MD  Icosapent Ethyl (VASCEPA) 1 g CAPS Take 2 capsules (2 g total) by mouth 2 (two) times daily. 07/30/18  Yes Imogene Burn, PA-C  losartan (COZAAR) 50 MG tablet Take 1 tablet (50 mg total) by mouth daily. 05/27/18 08/25/18 Yes [name], [name], MD  metFORMIN (GLUCOPHAGE) 1000 MG tablet Take 1 tablet (1,000 mg total) by mouth 2 (two) times daily with a meal. 03/21/18  Yes Philemon Kingdom, MD    OVER THE COUNTER MEDICATION 665 mg.   Yes [provider]  POTASSIUM GLUCONATE PO Take 595 mg by mouth.   Yes [provider]  triamcinolone (NASACORT) 55 MCG/ACT AERO nasal inhaler Place 2 sprays into the nose daily. 08/17/17  Yes SagardiaInes Bloomer, MD   Family History  Problem Relation Age of Onset  . Heart disease Mother   . Cancer Father    Social History   Socioeconomic History  . Marital status: Married    Spouse name: Not on file  . Number of children: Not on file  . Years of education: Not on file  . Highest education level: Not on file  Occupational History  . Not on file  Social Needs  . Financial resource strain: Not on file  . Food insecurity:    Worry: Not on file    Inability: Not on file  . Transportation needs:    Medical: Not on file    Non-medical: Not on file  Tobacco Use  . Smoking status: Never Smoker  . Smokeless tobacco: Never Used  Substance and Sexual Activity  . Alcohol use: Yes    Alcohol/week: 1.0 standard drinks    Types: 1 Standard drinks or equivalent per week  . Drug use: No  . Sexual activity: Yes  Lifestyle  . Physical activity:    Days per week: Not on file    Minutes per session: Not on file  . Stress: Not on file  Relationships  . Social connections:    Talks on phone: Not on file    Gets together: Not on file    Attends religious service: Not on file    Active member of club or organization: Not on file    Attends meetings of clubs or organizations: Not on file    Relationship status: Not on file  . Intimate partner violence:    Fear of current or ex partner: Not on file    Emotionally abused: Not on file    Physically abused: Not on file    Forced sexual activity: Not on file  Other Topics Concern  . Not on file  Social History Narrative  . Not on file    Review of Systems  All other systems reviewed and are negative. See HPI  Objective:  BP 140/72 (BP Location: Right Arm, Patient Position:  Sitting, Cuff Size: Large)   Pulse 84   Temp (!) 97.4 F (36.3 C) (Oral)   Resp 16   Ht 5\' 10"  (1.778 m)   Wt 227 lb 12.8 oz (103.3 kg)   SpO2 96%   BMI 32.69 kg/m  Physical Exam  Constitutional: He is oriented to person, place, and time. He appears well-developed and well-nourished.  HENT:  Head: Normocephalic and atraumatic.  Right Ear: Tympanic membrane, external ear and ear canal normal.  Left Ear: Tympanic membrane, external ear and ear canal normal.  Nose: No rhinorrhea.  Mouth/Throat: Oropharynx is clear and moist and mucous membranes are normal. No oropharyngeal exudate or  posterior oropharyngeal erythema.  Moderate cerumen in left and right ear.  Eyes: Pupils are equal, round, and reactive to light. Conjunctivae are normal.  Neck: Neck supple.  Cardiovascular: Normal rate, regular rhythm, normal heart sounds and intact distal pulses.  No murmur heard. Pulmonary/Chest: Effort normal and breath sounds normal. He has no wheezes. He has no rhonchi. He has no rales.  Abdominal: Soft. There is no abdominal tenderness.  Lymphadenopathy:    He has no cervical adenopathy.  Neurological: He is alert and oriented to person, place, and time.  Skin: Skin is warm and dry. No rash noted.  Psychiatric: He has a normal mood and affect. His behavior is normal.  Vitals reviewed.   Assessment & Plan:    [name] Love is a 63 y.o. male Cough - Plan: benzonatate (TESSALON) 100 MG capsule, HYDROcodone-homatropine (HYCODAN) 5-1.5 MG/5ML syrup  History of wheezing - Plan: albuterol (PROVENTIL HFA;VENTOLIN HFA) 108 (90 Base) MCG/ACT inhaler  Acute upper respiratory infection  Suspected viral URI with primarily cough at this point.  Possible reactive airway component.  Try Tessalon Perles, albuterol if needed, hydrocodone cough syrup at night if needed (and not wheezing or short of breath).  If persistent or frequent need of albuterol may need prednisone.  RTC/ER precautions  given. Meds ordered this encounter  Medications  . benzonatate (TESSALON) 100 MG capsule    Sig: Take 1 capsule (100 mg total) by mouth 3 (three) times daily as needed for cough.    Dispense:  20 capsule    Refill:  0  . HYDROcodone-homatropine (HYCODAN) 5-1.5 MG/5ML syrup    Sig: 43m by mouth a bedtime as needed for cough.    Dispense:  120 mL    Refill:  0  . albuterol (PROVENTIL HFA;VENTOLIN HFA) 108 (90 Base) MCG/ACT inhaler    Sig: Inhale 1-2 puffs into the lungs every 4 (four) hours as needed for wheezing or shortness of breath.    Dispense:  1 Inhaler    Refill:  0   Patient Instructions   Cough appears to be from initial virus at this time.  I do not hear any wheeze on your exam, but reactive airway can also contribute to your current cough.  Try Tessalon Perles 3 times per day as needed.  If wheezing, or tight cough can try 1 to 2 puffs of the albuterol.  If that helps you can use it up to every 4-6 hours, but if you need that medication more than 2 to 3 days in a row, or persistently needed multiple times per day let me know as prednisone may be needed.   Hydrocodone cough syrup at night if needed for the cough.  Do not take that if you are short of breath or wheezing.  Instead use albuterol.  Return to the clinic or go to the nearest emergency room if any of your symptoms worsen or new symptoms occur.   Cough, Adult  Coughing is a reflex that clears your throat and your airways. Coughing helps to heal and protect your lungs. It is normal to cough occasionally, but a cough that happens with other symptoms or lasts a long time may be a sign of a condition that needs treatment. A cough may last only 2-3 weeks (acute), or it may last longer than 8 weeks (chronic). What are the causes? Coughing is commonly caused by:  Breathing in substances that irritate your lungs.  A viral or bacterial respiratory infection.  Allergies.  Asthma.  Postnasal  drip.  Smoking.  Acid  backing up from the stomach into the esophagus (gastroesophageal reflux).  Certain medicines.  Chronic lung problems, including COPD (or rarely, lung cancer).  Other medical conditions such as heart failure. Follow these instructions at home: Pay attention to any changes in your symptoms. Take these actions to help with your discomfort:  Take medicines only as told by your health care provider. ? If you were prescribed an antibiotic medicine, take it as told by your health care provider. Do not stop taking the antibiotic even if you start to feel better. ? Talk with your health care provider before you take a cough suppressant medicine.  Drink enough fluid to keep your urine clear or pale yellow.  If the air is dry, use a cold steam vaporizer or humidifier in your bedroom or your home to help loosen secretions.  Avoid anything that causes you to cough at work or at home.  If your cough is worse at night, try sleeping in a semi-upright position.  Avoid cigarette smoke. If you smoke, quit smoking. If you need help quitting, ask your health care provider.  Avoid caffeine.  Avoid alcohol.  Rest as needed. Contact a health care provider if:  You have new symptoms.  You cough up pus.  Your cough does not get better after 2-3 weeks, or your cough gets worse.  You cannot control your cough with suppressant medicines and you are losing sleep.  You develop pain that is getting worse or pain that is not controlled with pain medicines.  You have a fever.  You have unexplained weight loss.  You have night sweats. Get help right away if:  You cough up blood.  You have difficulty breathing.  Your heartbeat is very fast. This information is not intended to replace advice given to you by your health care provider. Make sure you discuss any questions you have with your health care provider. Document Released: 01/06/2011 Document Revised: 12/16/2015 Document Reviewed:  09/16/2014 Elsevier Interactive Patient Education  [employer].   If you have lab work done today you will be contacted with your lab results within the next 2 weeks.  If you have not heard from [place] then please contact us. The fastest way to get your results is to register for My Chart.   IF you received an x-ray today, you will receive an invoice from [facility]. Please contact [facility] at [phone] with questions or concerns regarding your invoice.   IF you received labwork today, you will receive an invoice from [place]. Please contact [facility] at [phone] with questions or concerns regarding your invoice.   Our billing staff will not be able to assist you with questions regarding bills from these companies.  You will be contacted with the lab results as soon as they are available. The fastest way to get your results is to activate your My Chart account. Instructions are located on the last page of this paperwork. If you have not heard from [place] regarding the results in 2 weeks, please contact this office.       I personally performed the services described in this documentation, which was scribed in my presence. The recorded information has been reviewed and considered for accuracy and completeness, addended by me as needed, and agree with information above.  Signed,   Merri Ray, MD [facility] [place].  08/11/18 11:00 AM

## 2018-08-31 ENCOUNTER — Other Ambulatory Visit: Payer: Self-pay | Admitting: Internal Medicine

## 2018-09-18 ENCOUNTER — Other Ambulatory Visit: Payer: Self-pay | Admitting: Internal Medicine

## 2018-10-29 ENCOUNTER — Other Ambulatory Visit: Payer: Self-pay | Admitting: Internal Medicine

## 2018-10-29 MED ORDER — FREESTYLE LIBRE 14 DAY SENSOR MISC: 2.0000 | 0 refills | Status: DC

## 2018-10-30 ENCOUNTER — Encounter: Payer: Self-pay | Admitting: Internal Medicine

## 2018-11-05 ENCOUNTER — Ambulatory Visit (INDEPENDENT_AMBULATORY_CARE_PROVIDER_SITE_OTHER): Payer: BC Managed Care – PPO

## 2018-11-05 ENCOUNTER — Other Ambulatory Visit: Payer: Self-pay

## 2018-11-05 DIAGNOSIS — E118 Type 2 diabetes mellitus with unspecified complications: Secondary | ICD-10-CM | POA: Diagnosis not present

## 2018-11-05 DIAGNOSIS — E1165 Type 2 diabetes mellitus with hyperglycemia: Secondary | ICD-10-CM

## 2018-11-05 DIAGNOSIS — IMO0002 Reserved for concepts with insufficient information to code with codable children: Secondary | ICD-10-CM

## 2018-11-05 LAB — POCT GLYCOSYLATED HEMOGLOBIN (HGB A1C): Hemoglobin A1C: 7 % — AB (ref 4.0–5.6)

## 2018-11-05 NOTE — Progress Notes (Signed)
A1c test.

## 2018-11-07 ENCOUNTER — Other Ambulatory Visit: Payer: Self-pay

## 2018-11-07 ENCOUNTER — Ambulatory Visit (INDEPENDENT_AMBULATORY_CARE_PROVIDER_SITE_OTHER): Payer: BC Managed Care – PPO | Admitting: Internal Medicine

## 2018-11-07 ENCOUNTER — Encounter: Payer: Self-pay | Admitting: Internal Medicine

## 2018-11-07 DIAGNOSIS — E785 Hyperlipidemia, unspecified: Secondary | ICD-10-CM

## 2018-11-07 DIAGNOSIS — E669 Obesity, unspecified: Secondary | ICD-10-CM

## 2018-11-07 DIAGNOSIS — IMO0002 Reserved for concepts with insufficient information to code with codable children: Secondary | ICD-10-CM

## 2018-11-07 DIAGNOSIS — E1165 Type 2 diabetes mellitus with hyperglycemia: Secondary | ICD-10-CM

## 2018-11-07 DIAGNOSIS — E118 Type 2 diabetes mellitus with unspecified complications: Secondary | ICD-10-CM

## 2018-11-07 DIAGNOSIS — Z6832 Body mass index (BMI) 32.0-32.9, adult: Secondary | ICD-10-CM

## 2018-11-07 MED ORDER — FREESTYLE LIBRE 14 DAY SENSOR MISC: 2.0000 | 3 refills | Status: DC

## 2018-11-07 NOTE — Patient Instructions (Addendum)
Please continue: - Metformin 1000 mg 2x a day, with meals - Glipizide 5 mg before a larger meal  - Farxiga 10 mg before b'fast - Trulicity 1.5 mg weekly  Try to eliminate OJ or change b'fast to see how the sugars change after this meal.  If the sugars are consistently >180, add Glipizide 5 mg before b'fast.  Please return in 3-4 months with your sugar log.

## 2018-11-07 NOTE — Progress Notes (Signed)
Patient ID: Lord Lancour, male   DOB: [birth year], 63 y.o.   MRN: [id number]   Patient location: Home My location: Office  Referring Provider: [name], MD  I connected with the patient on 11/07/18 at 10:35 AM EDT by a video enabled telemedicine application and verified that I am speaking with the correct person.   I discussed the limitations of evaluation and management by telemedicine and the availability of in person appointments. The patient expressed understanding and agreed to proceed.   Details of the encounter are shown below. HPI: Rogen Porte is a 63 y.o.-year-old male, initially referred by his PCP, Dr. Mitchel Honour, returning for follow-up for DM2, dx in ~2008, non-insulin-dependent, uncontrolled, with long term complications (CAD - s/p CABG in 2007, CKD, PN, DR).  Last visit 4 months ago.  Last hemoglobin A1c was: Lab Results  Component Value Date   HGBA1C 7.0 (A) 11/05/2018   HGBA1C 6.5 (A) 07/10/2018   HGBA1C 6.8 (A) 05/27/2018   HGBA1C 6.9 (A) 03/21/2018   HGBA1C 6.4 11/06/2017   HGBA1C 8.2 06/21/2017   HGBA1C 9.5 03/21/2017   HGBA1C 8.8 07/27/2016   HGBA1C 9.3 10/08/2015   HGBA1C 8.6 09/24/2014   HGBA1C 7.0 09/15/2013   HGBA1C 7.2 05/22/2013   HGBA1C 6.7 09/12/2012   HGBA1C 6.7 04/23/2012   HGBA1C 7.3 01/16/2012  He got a steroid inj in shoulder >> 05/10/2017.  Pt is on a regimen of: - Metformin 1000 mg 2x a day, with meals - Glipizide 5 mg before a larger meal  - Farxiga 10 mg before b'fast - Trulicity 1.5 mg weekly -started 05/2017  He checks his sugars more than 4 times a day with his freestyle libre CGM.  We downloaded his CGM reports (will be scanned):  Ave: 153+/-34.2 >> 137 +/- 46.7 >> 138 Coefficient of variation: 20.9% Percent CGM active: 68% in the last 2 weeks  CGM parameters: Time in range:  - very low (less than 54): 0% - low (54-69): 0% - normal range (70-180): 91% - high sugars (180-250): 9% - very high  sugars (250-400): 0%   Previously:  Lowest sugar was 58 x1 at night (? Cause) >> ... 88 >> 80s; it is unclear at which level he has hypoglycemia awareness. Highest sugar was 300s >> 200s  Pt's meals are: - Breakfast: bagel, OJ >> cereals >> occas. Cereals, or eggs  - Lunch: soup or sandwich or leftovers - Dinner: meat, veggies - Snacks: not daily  -+ Mild CKD, last BUN/creatinine:  Lab Results  Component Value Date   BUN 14 05/27/2018   BUN 18 06/21/2017   CREATININE 0.94 05/27/2018   CREATININE 1.27 06/21/2017  On losartan 50. -+ HL; last set of lipids: Lab Results  Component Value Date   CHOL 104 05/27/2018   HDL 32 (L) 05/27/2018   LDLCALC 24 05/27/2018   LDLDIRECT 51 06/21/2017   TRIG 238 (H) 05/27/2018   CHOLHDL 3.3 05/27/2018  On Zocor 40, omega-3 fatty acids 2000 mg twice a day, Vascepa. - last eye exam was in 09/2018: + DR. Dr. Alois Cliche. -+ Numbness and tingling in his feet. On ASA 81.  He went to [place] since last visit.  ROS: Constitutional: no weight gain/no weight loss, no fatigue, no subjective hyperthermia, no subjective hypothermia Eyes: no blurry vision, no xerophthalmia ENT: no sore throat, no nodules palpated in neck, no dysphagia, no odynophagia, no hoarseness Cardiovascular: no CP/no SOB/no palpitations/no leg swelling Respiratory: no cough/no SOB/no wheezing Gastrointestinal: no N/no V/no  D/no C/no acid reflux Musculoskeletal: no muscle aches/no joint aches Skin: no rashes, no hair loss Neurological: no tremors/no numbness/no tingling/no dizziness  I reviewed pt's medications, allergies, PMH, social hx, family hx, and changes were documented in the history of present illness. Otherwise, unchanged from my initial visit note.  Past Medical History:  Diagnosis Date  . Coronary artery disease    post CABG x3 in 2007  . Diabetes mellitus   . Hyperlipidemia   . Hypertension   . Obesity    Past Surgical History:  Procedure Laterality Date   . CARDIAC CATHETERIZATION  EF= 45-50%   EF= 45-50% -- Three-vessel coronary artery disease with high-grade lesions in the proximal left anterior descending artery and multiple lesions in the right coronary -- Low normal ejection fraction -- Stable exertional angina -- [name], M.D. [facility]  . CORONARY ARTERY BYPASS GRAFT  02/19/2006     x3 (left internal mammary artery to LAD, left radial artery to posterior descending, saphenous vein graft to first diagonal), endoscopic vein harvest right leg -- SURGEON:  [name]. [name], M.D.  . HERNIA REPAIR     at 63 y/o  . tripple bypass     Social History   Socioeconomic History  . Marital status: Married    Spouse name: Not on file  . Number of children: 3  Social Needs  Occupational History  . unemployed  Tobacco Use  . Smoking status: Never Smoker  . Smokeless tobacco: Never Used  Substance and Sexual Activity  . Alcohol use: Yes    Alcohol/week:     Types: 1-2 per mo  . Drug use: No  .    Other Topics   .   Social History Narrative  .    Current Outpatient Medications on File Prior to Visit  Medication Sig Dispense Refill  . albuterol (PROVENTIL HFA;VENTOLIN HFA) 108 (90 Base) MCG/ACT inhaler Inhale 1-2 puffs into the lungs every 4 (four) hours as needed for wheezing or shortness of breath. 1 Inhaler 0  . aspirin EC 81 MG tablet Take 1 tablet (81 mg total) by mouth daily. 90 tablet 3  . atorvastatin (LIPITOR) 40 MG tablet Take 1 tablet (40 mg total) by mouth daily. 90 tablet 3  . benzonatate (TESSALON) 100 MG capsule Take 1 capsule (100 mg total) by mouth 3 (three) times daily as needed for cough. 20 capsule 0  . cetirizine (ZYRTEC) 10 MG tablet Take 1 tablet (10 mg total) by mouth daily. 30 tablet 11  . Continuous Blood Gluc Sensor (FREESTYLE LIBRE 14 DAY SENSOR) MISC 2 each by Subdermal route every 14 (fourteen) days. CHANGE SENSOR EVERY 2 WEEKS AS DIRECTED 2 each 0  . dapagliflozin propanediol (FARXIGA) 10 MG TABS  tablet Take 10 mg by mouth daily. 90 tablet 3  . Dulaglutide (TRULICITY) 1.5 QM/2.5OI SOPN INJECT 1.5 MG INTO THE SKIN ONCE A WEEK 12 pen 3  . glipiZIDE (GLUCOTROL) 5 MG tablet Take 1 tablet (5 mg total) by mouth daily. 30 tablet 5  . HYDROcodone-homatropine (HYCODAN) 5-1.5 MG/5ML syrup 11m by mouth a bedtime as needed for cough. 120 mL 0  . Icosapent Ethyl (VASCEPA) 1 g CAPS Take 2 capsules (2 g total) by mouth 2 (two) times daily. 480 capsule 3  . losartan (COZAAR) 50 MG tablet Take 1 tablet (50 mg total) by mouth daily. 90 tablet 3  . metFORMIN (GLUCOPHAGE) 1000 MG tablet Take 1 tablet (1,000 mg total) by mouth 2 (two) times daily with a  meal. 180 tablet 3  . OVER THE COUNTER MEDICATION 665 mg.    . POTASSIUM GLUCONATE PO Take 595 mg by mouth.    . triamcinolone (NASACORT) 55 MCG/ACT AERO nasal inhaler Place 2 sprays into the nose daily. 1 Inhaler 12   No current facility-administered medications on file prior to visit.    No Known Allergies Family History  Problem Relation Age of Onset  . Heart disease Mother   . Cancer Father     PE: There were no vitals taken for this visit. Wt Readings from Last 3 Encounters:  08/10/18 227 lb 12.8 oz (103.3 kg)  07/30/18 222 lb 1.9 oz (100.8 kg)  07/10/18 225 lb (102.1 kg)   Constitutional:  in NAD  The physical exam was not performed (virtual visit). ASSESSMENT: 1. DM2, non-insulin-dependent, uncontrolled, without complications - CAD - s/p CABG in 2007 - CKD - PN - DR  2. HL  3. Obesity  PLAN:  1. Patient with longstanding, previously uncontrolled type 2 diabetes, on oral antidiabetic regimen and weekly GLP-1 receptor agonist, with improved control after addition of Trulicity.  We were able to decrease glipizide to only take it before a larger meal.  At last visit, sugars appear improved in the 2 weeks prior to the appointment and his HbA1c had also improved to 6.5%.  Therefore, we did not change his regimen. -He came yesterday to  the clinic for an HbA1c and this was higher, at 7.0%, increased from 6.5%. -At this visit, we reviewed together his CGM downloads and it appears that his sugars have improved since last visit.  He does not have a steep increase in blood sugars after lunch as he is now having a late breakfast/brunch.  His sugars do increase after this meal, but not higher than 188 usually.  He occasionally has spikes after this meal and these may be related to orange juice.  He is drinking half a cup of orange juice occasionally, not quite every day.  I advised him to eliminate this.  Also, if his sugars start to become consistently above 180 after this meal, I advised him to add glipizide before it, consistently.  Otherwise, no need to change his regimen. - I suggested to:  Patient Instructions  Please continue: - Metformin 1000 mg 2x a day, with meals - Glipizide 5 mg before a larger meal  - Farxiga 10 mg before b'fast - Trulicity 1.5 mg weekly  Try to eliminate OJ or change b'fast to see how the sugars change after this meal.  If the sugars are consistently >180, add Glipizide 5 mg before b'fast.  Please return in 3-4 months with your sugar log.  - continue checking sugars at different times of the day - check 1x a day, rotating checks - advised for yearly eye exams >> he is UTD - Return to clinic in 3-4 mo with sugar log    2.  Hyperlipidemia - Reviewed latest lipid panel from 05/2018: LDL at goal, triglycerides high, HDL low Lab Results  Component Value Date   CHOL 104 05/27/2018   HDL 32 (L) 05/27/2018   LDLCALC 24 05/27/2018   LDLDIRECT 51 06/21/2017   TRIG 238 (H) 05/27/2018   CHOLHDL 3.3 05/27/2018  - Continues the statin without side effects.  Also on omega-3 fatty acids, Vascepa.  3.  Obesity -Continue SGLT 2 inhibitor and GLP-1 receptor agonist which should both help with weight loss -No signif. weight loss since last visit (he believes he lost approximately  4 pounds)  Philemon Kingdom,  MD PhD [facility] Endocrinology

## 2018-12-24 LAB — HM DIABETES EYE EXAM

## 2019-01-15 ENCOUNTER — Encounter: Payer: Self-pay | Admitting: *Deleted

## 2019-01-20 ENCOUNTER — Telehealth: Payer: Self-pay

## 2019-01-20 NOTE — Telephone Encounter (Addendum)
Letter received from [facility] stating that they have approved the pts Vascepa PA. Approval is good from 01/20/2019-01/19/2022  I have notified the pts pharmacy of this approval.

## 2019-01-20 NOTE — Telephone Encounter (Signed)
**Note De-Identified [name] Love Obfuscation** I have started a Vascepa PA through covermymeds. Key: [id number]

## 2019-03-20 ENCOUNTER — Ambulatory Visit: Payer: Self-pay | Admitting: Internal Medicine

## 2019-03-29 ENCOUNTER — Other Ambulatory Visit: Payer: Self-pay | Admitting: Internal Medicine

## 2019-03-29 DIAGNOSIS — IMO0002 Reserved for concepts with insufficient information to code with codable children: Secondary | ICD-10-CM

## 2019-03-29 DIAGNOSIS — E1165 Type 2 diabetes mellitus with hyperglycemia: Secondary | ICD-10-CM

## 2019-03-29 MED ORDER — FARXIGA 10 MG PO TABS: 10.0000 mg | ORAL_TABLET | Freq: Every day | ORAL | 3 refills | Status: DC

## 2019-03-29 MED ORDER — METFORMIN HCL 1000 MG PO TABS: 1000.0000 mg | ORAL_TABLET | Freq: Two times a day (BID) | ORAL | 3 refills | Status: DC

## 2019-04-01 ENCOUNTER — Other Ambulatory Visit: Payer: Self-pay

## 2019-04-02 ENCOUNTER — Encounter: Payer: Self-pay | Admitting: Internal Medicine

## 2019-04-02 ENCOUNTER — Ambulatory Visit: Payer: BC Managed Care – PPO | Admitting: Internal Medicine

## 2019-04-02 VITALS — BP 138/60 | HR 85 | Ht 70.0 in | Wt 221.0 lb

## 2019-04-02 DIAGNOSIS — E785 Hyperlipidemia, unspecified: Secondary | ICD-10-CM

## 2019-04-02 DIAGNOSIS — E669 Obesity, unspecified: Secondary | ICD-10-CM | POA: Diagnosis not present

## 2019-04-02 DIAGNOSIS — E1165 Type 2 diabetes mellitus with hyperglycemia: Secondary | ICD-10-CM

## 2019-04-02 DIAGNOSIS — Z6832 Body mass index (BMI) 32.0-32.9, adult: Secondary | ICD-10-CM

## 2019-04-02 DIAGNOSIS — E1159 Type 2 diabetes mellitus with other circulatory complications: Secondary | ICD-10-CM | POA: Diagnosis not present

## 2019-04-02 LAB — POCT GLYCOSYLATED HEMOGLOBIN (HGB A1C): Hemoglobin A1C: 6.8 % — AB (ref 4.0–5.6)

## 2019-04-02 MED ORDER — FARXIGA 10 MG PO TABS: 10.0000 mg | ORAL_TABLET | Freq: Every day | ORAL | 3 refills | Status: DC

## 2019-04-02 MED ORDER — TRULICITY 1.5 MG/0.5ML ~~LOC~~ SOAJ: SUBCUTANEOUS | 11 refills | Status: DC

## 2019-04-02 NOTE — Progress Notes (Signed)
Patient ID: Alejando Kopper, male   DOB: July 24, 1956, 63 y.o.   MRN: [id number]   HPI: Romane Mooradian is a 63 y.o.-year-old male, initially referred by his PCP, Dr. Mitchel Honour, returning for follow-up for DM2, dx in ~2008, non-insulin-dependent, uncontrolled, with long term complications (CAD - s/p CABG in 2007, CKD, PN, DR).  Last visit 5 months ago (virtual).  Since last visit, he eliminated orange juice with breakfast.  Sugars improved.  Last hemoglobin A1c was higher: Lab Results  Component Value Date   HGBA1C 7.0 (A) 11/05/2018   HGBA1C 6.5 (A) 07/10/2018   HGBA1C 6.8 (A) 05/27/2018   HGBA1C 6.9 (A) 03/21/2018   HGBA1C 6.4 11/06/2017   HGBA1C 8.2 06/21/2017   HGBA1C 9.5 03/21/2017   HGBA1C 8.8 07/27/2016   HGBA1C 9.3 10/08/2015   HGBA1C 8.6 09/24/2014   HGBA1C 7.0 09/15/2013   HGBA1C 7.2 05/22/2013   HGBA1C 6.7 09/12/2012   HGBA1C 6.7 04/23/2012   HGBA1C 7.3 01/16/2012  He got a steroid inj in shoulder >> 05/10/2017.  Pt is on a regimen of: - Metformin 1000 mg 2x a day, with meals - Glipizide 5 mg before a larger meal  - Farxiga 10 mg before b'fast - Trulicity 1.5 mg weekly -started 05/2017  He checks his sugars more than 4 times a day with his freestyle libre CGM.  We will scan the reports.  Ave: 153+/-34.2 >> 137 +/- 46.7 >> 138 >> 123 Coefficient of variation: 20.9% >> 26.4% Percent CGM active: 68% >> 59% in the last 2 weeks  CGM parameters: Time in range:  - very low (less than 54): 0% >> 0% - low (54-69): 0% >> 2% - normal range (70-180): 91% >> 93% - high sugars (180-250): 9% >> 5% - very high sugars (250-400): 0% >> 0%   Previously:  Previously:  Lowest sugar was 58 x1 at night (? Cause) >> ... 88 >> 80s >> 60s (delayed meal); it is unclear at which level he has hypoglycemia awareness. Highest sugar was 300s >> 200s >> 240.  Pt's meals are: - Breakfast: bagel, OJ >> cereals >> occas. Cereals, or eggs  >> stopped OJ - Lunch: soup or  sandwich or leftovers - Dinner: meat, veggies - Snacks: not daily  -+ Mild CKD, last BUN/creatinine:  Lab Results  Component Value Date   BUN 14 05/27/2018   BUN 18 06/21/2017   CREATININE 0.94 05/27/2018   CREATININE 1.27 06/21/2017  On losartan 50. -+ HL; last set of lipids: Lab Results  Component Value Date   CHOL 104 05/27/2018   HDL 32 (L) 05/27/2018   LDLCALC 24 05/27/2018   LDLDIRECT 51 06/21/2017   TRIG 238 (H) 05/27/2018   CHOLHDL 3.3 05/27/2018  On Zocor 40, omega-3 fatty acids 2000 mg 2x a day, Vascepa.  - last eye exam was in 09/2018: + DR.  Dr. Alois Cliche. -+ Numbness and tingling in his feet. On ASA 81.  ROS: Constitutional: no weight gain/no weight loss, no fatigue, no subjective hyperthermia, no subjective hypothermia Eyes: no blurry vision, no xerophthalmia ENT: no sore throat, no nodules palpated in neck, no dysphagia, no odynophagia, no hoarseness Cardiovascular: no CP/no SOB/no palpitations/no leg swelling Respiratory: no cough/no SOB/no wheezing Gastrointestinal: no N/no V/no D/no C/no acid reflux Musculoskeletal: no muscle aches/no joint aches Skin: no rashes, no hair loss Neurological: no tremors/+ numbness/+ tingling/no dizziness  I reviewed pt's medications, allergies, PMH, social hx, family hx, and changes were documented in the history of present illness. Otherwise,  unchanged from my initial visit note.  Past Medical History:  Diagnosis Date   Coronary artery disease    post CABG x3 in 2007   Diabetes mellitus    Hyperlipidemia    Hypertension    Obesity    Past Surgical History:  Procedure Laterality Date   CARDIAC CATHETERIZATION  EF= 45-50%   EF= 45-50% -- Three-vessel coronary artery disease with high-grade lesions in the proximal left anterior descending artery and multiple lesions in the right coronary -- Low normal ejection fraction -- Stable exertional angina -- [name], M.D. [facility]   CORONARY ARTERY BYPASS GRAFT   02/19/2006     x3 (left internal mammary artery to LAD, left radial artery to posterior descending, saphenous vein graft to first diagonal), endoscopic vein harvest right leg -- SURGEON:  [name]. [name], M.D.   HERNIA REPAIR     at 63 y/o   tripple bypass     Social History   Socioeconomic History   Marital status: Married    Spouse name: Not on file   Number of children: 3  Social Needs  Occupational History   unemployed  Tobacco Use   Smoking status: Never Smoker   Smokeless tobacco: Never Used  Substance and Sexual Activity   Alcohol use: Yes    Alcohol/week:     Types: 1-2 per mo   Drug use: No      Other Topics      Social History Narrative      Current Outpatient Medications on File Prior to Visit  Medication Sig Dispense Refill   albuterol (PROVENTIL HFA;VENTOLIN HFA) 108 (90 Base) MCG/ACT inhaler Inhale 1-2 puffs into the lungs every 4 (four) hours as needed for wheezing or shortness of breath. 1 Inhaler 0   aspirin EC 81 MG tablet Take 1 tablet (81 mg total) by mouth daily. 90 tablet 3   atorvastatin (LIPITOR) 40 MG tablet Take 1 tablet (40 mg total) by mouth daily. 90 tablet 3   cetirizine (ZYRTEC) 10 MG tablet Take 1 tablet (10 mg total) by mouth daily. 30 tablet 11   Continuous Blood Gluc Sensor (FREESTYLE LIBRE 14 DAY SENSOR) MISC 2 each by Subdermal route every 14 (fourteen) days. CHANGE SENSOR EVERY 2 WEEKS AS DIRECTED 6 each 3   dapagliflozin propanediol (FARXIGA) 10 MG TABS tablet Take 10 mg by mouth daily. 90 tablet 3   Dulaglutide (TRULICITY) 1.5 0000000 SOPN INJECT 1.5 MG INTO THE SKIN ONCE A WEEK 12 pen 3   glipiZIDE (GLUCOTROL) 5 MG tablet Take 1 tablet (5 mg total) by mouth daily. 30 tablet 5   HYDROcodone-homatropine (HYCODAN) 5-1.5 MG/5ML syrup 60m by mouth a bedtime as needed for cough. 120 mL 0   Icosapent Ethyl (VASCEPA) 1 g CAPS Take 2 capsules (2 g total) by mouth 2 (two) times daily. 480 capsule 3   losartan  (COZAAR) 50 MG tablet Take 1 tablet (50 mg total) by mouth daily. 90 tablet 3   metFORMIN (GLUCOPHAGE) 1000 MG tablet Take 1 tablet (1,000 mg total) by mouth 2 (two) times daily with a meal. 180 tablet 3   OVER THE COUNTER MEDICATION 665 mg.     POTASSIUM GLUCONATE PO Take 595 mg by mouth.     triamcinolone (NASACORT) 55 MCG/ACT AERO nasal inhaler Place 2 sprays into the nose daily. 1 Inhaler 12   No current facility-administered medications on file prior to visit.    No Known Allergies Family History  Problem Relation Age of Onset  Heart disease Mother    Cancer Father     PE: BP 138/60    Pulse 85    Ht 5\' 10"  (1.778 m)    Wt 221 lb (100.2 kg)    SpO2 96%    BMI 31.71 kg/m  Wt Readings from Last 3 Encounters:  04/02/19 221 lb (100.2 kg)  08/10/18 227 lb 12.8 oz (103.3 kg)  07/30/18 222 lb 1.9 oz (100.8 kg)   Constitutional: overweight, in NAD Eyes: PERRLA, EOMI, no exophthalmos ENT: moist mucous membranes, no thyromegaly, no cervical lymphadenopathy Cardiovascular: RRR, No MRG Respiratory: CTA B Gastrointestinal: abdomen soft, NT, ND, BS+ Musculoskeletal: no deformities, strength intact in all 4 Skin: moist, warm, no rashes Neurological: no tremor with outstretched hands, DTR normal in all 4  ASSESSMENT: 1. DM2, non-insulin-dependent, uncontrolled, without complications - CAD - s/p CABG in 2007 - CKD - PN - DR  2. HL  3. Obesity  PLAN:  1. Patient with longstanding, previously uncontrolled type 2 diabetes, on oral antidiabetic regimen and also weekly GLP-1 receptor agonist, with slightly higher HbA1c at last visit.  His sugars did not appear improved at last visit except for a steep increase in blood sugars after lunch as he was having a late breakfast/brunch.  We discussed about trying to improve the meal and also stopping orange juice which I felt was the cause for his postprandial hyperglycemia.  I also advised him that if the sugars do not improve  significantly afterwards, to add glipizide 5 mg before breakfast. -At this visit, sugars are significantly improved especially in the last 2 weeks, with only occasional hyperglycemic spikes.  He is now off of orange juice and I think this made a difference in terms of his post breakfast blood sugars but, importantly, even sugars in the second half of the day have improved.  No need to change his regimen for now.  I refilled his prescriptions for Trulicity and  Farxiga. - I suggested to:  Patient Instructions  Please continue: - Metformin 1000 mg 2x a day, with meals - Glipizide 5 mg before a larger meal  - Farxiga 10 mg before b'fast - Trulicity 1.5 mg weekly  Please return in 3-4 months with your sugar log.  - we checked his HbA1c: 6.8% (better) - advised to check sugars at different times of the day - 1x a day, rotating check times - advised for yearly eye exams >> he is UTD - return to clinic in 3-4 months    2.  Hyperlipidemia - Reviewed latest lipid panel for 05/2018: Triglycerides high, HDL low, LDL at goal Lab Results  Component Value Date   CHOL 104 05/27/2018   HDL 32 (L) 05/27/2018   LDLCALC 24 05/27/2018   LDLDIRECT 51 06/21/2017   TRIG 238 (H) 05/27/2018   CHOLHDL 3.3 05/27/2018  - Continues the statin without side effects.  He also continues omega-3 fatty acids, Vascepa.  3.  Obesity -Continue SGLT 2 inhibitor and GLP-1 receptor agonist, which both help with weight loss -lost 6 lbs since last OV!  Philemon Kingdom, MD PhD [facility] Endocrinology

## 2019-04-02 NOTE — Patient Instructions (Signed)
Please continue: - Metformin 1000 mg 2x a day, with meals - Glipizide 5 mg before a larger meal  - Farxiga 10 mg before b'fast - Trulicity 1.5 mg weekly  Please return in 4 months with your sugar log.

## 2019-04-07 ENCOUNTER — Ambulatory Visit: Payer: Self-pay | Admitting: Internal Medicine

## 2019-05-01 ENCOUNTER — Other Ambulatory Visit: Payer: Self-pay

## 2019-05-01 DIAGNOSIS — Z20822 Contact with and (suspected) exposure to covid-19: Secondary | ICD-10-CM

## 2019-05-03 LAB — NOVEL CORONAVIRUS, NAA: SARS-CoV-2, NAA: NOT DETECTED

## 2019-05-28 ENCOUNTER — Other Ambulatory Visit: Payer: Self-pay | Admitting: Emergency Medicine

## 2019-05-28 ENCOUNTER — Telehealth: Payer: Self-pay | Admitting: *Deleted

## 2019-05-28 DIAGNOSIS — I10 Essential (primary) hypertension: Secondary | ICD-10-CM

## 2019-05-28 NOTE — Telephone Encounter (Signed)
Requested medication (s) are due for refill today: yes  Requested medication (s) are on the active medication list:yes  Last refill:  02/16/2019  Future visit scheduled:no  Notes to clinic:  Review for refill Overdue for office visit    Requested Prescriptions  Pending Prescriptions Disp Refills   losartan (COZAAR) 50 MG tablet [Pharmacy Med Name: Losartan Potassium 50 MG Oral Tablet] 90 tablet 0    Sig: Take 1 tablet by mouth once daily     Cardiovascular:  Angiotensin Receptor Blockers Failed - 05/28/2019 10:57 AM      Failed - Cr in normal range and within 180 days    Creat  Date Value Ref Range Status  05/29/2017 1.48 (H) 0.70 - 1.25 mg/dL Final    Comment:    For patients >7 years of age, the reference limit for Creatinine is approximately 13% higher for people identified as African-American. .    Creatinine, Ser  Date Value Ref Range Status  05/27/2018 0.94 0.76 - 1.27 mg/dL Final         Failed - K in normal range and within 180 days    Potassium  Date Value Ref Range Status  05/27/2018 4.6 3.5 - 5.2 mmol/L Final         Failed - Valid encounter within last 6 months    Recent Outpatient Visits          9 months ago Cough   [facility], Ranell Patrick, MD   1 year ago Routine general medical examination at a health care facility   [facility] at [facility], MD   1 year ago Acute upper respiratory infection   [facility] at [facility], [name], MD   1 year ago Cough   [facility] at [place], [place], [place]   1 year ago Type 2 diabetes mellitus with hyperglycemia, without long-term current use of insulin [facility])   [facility] at [facility], [place], [place] - Patient is not pregnant      Passed - Last BP in normal range    BP Readings from Last 1 Encounters:  04/02/19 138/60

## 2019-05-28 NOTE — Telephone Encounter (Signed)
Sent to scheduling pool.

## 2019-05-29 NOTE — Telephone Encounter (Signed)
lvmtcb and schedule med refill appt

## 2019-06-10 ENCOUNTER — Ambulatory Visit (INDEPENDENT_AMBULATORY_CARE_PROVIDER_SITE_OTHER): Payer: BC Managed Care – PPO | Admitting: Emergency Medicine

## 2019-06-10 ENCOUNTER — Other Ambulatory Visit: Payer: Self-pay

## 2019-06-10 DIAGNOSIS — E1169 Type 2 diabetes mellitus with other specified complication: Secondary | ICD-10-CM | POA: Diagnosis not present

## 2019-06-10 DIAGNOSIS — E785 Hyperlipidemia, unspecified: Secondary | ICD-10-CM

## 2019-06-11 LAB — CBC WITH DIFFERENTIAL/PLATELET
Basophils Absolute: 0 10*3/uL (ref 0.0–0.2)
Basos: 1 %
EOS (ABSOLUTE): 0.1 10*3/uL (ref 0.0–0.4)
Eos: 3 %
Hematocrit: 41.8 % (ref 37.5–51.0)
Hemoglobin: 13.3 g/dL (ref 13.0–17.7)
Immature Grans (Abs): 0 10*3/uL (ref 0.0–0.1)
Immature Granulocytes: 0 %
Lymphocytes Absolute: 1.3 10*3/uL (ref 0.7–3.1)
Lymphs: 25 %
MCH: 24.9 pg — ABNORMAL LOW (ref 26.6–33.0)
MCHC: 31.8 g/dL (ref 31.5–35.7)
MCV: 78 fL — ABNORMAL LOW (ref 79–97)
Monocytes Absolute: 0.4 10*3/uL (ref 0.1–0.9)
Monocytes: 7 %
Neutrophils Absolute: 3.4 10*3/uL (ref 1.4–7.0)
Neutrophils: 64 %
Platelets: 205 10*3/uL (ref 150–450)
RBC: 5.34 x10E6/uL (ref 4.14–5.80)
RDW: 15 % (ref 11.6–15.4)
WBC: 5.3 10*3/uL (ref 3.4–10.8)

## 2019-06-11 LAB — HEMOGLOBIN A1C
Est. average glucose Bld gHb Est-mCnc: 163 mg/dL
Hgb A1c MFr Bld: 7.3 % — ABNORMAL HIGH (ref 4.8–5.6)

## 2019-06-11 LAB — COMPREHENSIVE METABOLIC PANEL
ALT: 27 IU/L (ref 0–44)
AST: 24 IU/L (ref 0–40)
Albumin/Globulin Ratio: 2.2 (ref 1.2–2.2)
Albumin: 4.8 g/dL (ref 3.8–4.8)
Alkaline Phosphatase: 57 IU/L (ref 39–117)
BUN/Creatinine Ratio: 12 (ref 10–24)
BUN: 14 mg/dL (ref 8–27)
Bilirubin Total: 0.4 mg/dL (ref 0.0–1.2)
CO2: 21 mmol/L (ref 20–29)
Calcium: 9.6 mg/dL (ref 8.6–10.2)
Chloride: 101 mmol/L (ref 96–106)
Creatinine, Ser: 1.18 mg/dL (ref 0.76–1.27)
GFR calc Af Amer: 76 mL/min/{1.73_m2} (ref >59)
GFR calc non Af Amer: 66 mL/min/{1.73_m2} (ref >59)
Globulin, Total: 2.2 g/dL (ref 1.5–4.5)
Glucose: 246 mg/dL — ABNORMAL HIGH (ref 65–99)
Potassium: 4.8 mmol/L (ref 3.5–5.2)
Sodium: 139 mmol/L (ref 134–144)
Total Protein: 7 g/dL (ref 6.0–8.5)

## 2019-06-11 LAB — LIPID PANEL
Chol/HDL Ratio: 3.2 ratio (ref 0.0–5.0)
Cholesterol, Total: 99 mg/dL — ABNORMAL LOW (ref 100–199)
HDL: 31 mg/dL — ABNORMAL LOW (ref >39)
LDL Chol Calc (NIH): 35 mg/dL (ref 0–99)
Triglycerides: 209 mg/dL — ABNORMAL HIGH (ref 0–149)
VLDL Cholesterol Cal: 33 mg/dL (ref 5–40)

## 2019-06-11 NOTE — Progress Notes (Signed)
Visit for blood work only.

## 2019-06-24 ENCOUNTER — Other Ambulatory Visit: Payer: Self-pay | Admitting: Emergency Medicine

## 2019-06-24 ENCOUNTER — Encounter: Payer: Self-pay | Admitting: Emergency Medicine

## 2019-06-24 ENCOUNTER — Ambulatory Visit (INDEPENDENT_AMBULATORY_CARE_PROVIDER_SITE_OTHER): Payer: BC Managed Care – PPO | Admitting: Emergency Medicine

## 2019-06-24 ENCOUNTER — Other Ambulatory Visit: Payer: Self-pay

## 2019-06-24 VITALS — BP 115/63 | HR 87 | Temp 98.2°F | Resp 16 | Ht 70.5 in | Wt 222.8 lb

## 2019-06-24 DIAGNOSIS — Z0001 Encounter for general adult medical examination with abnormal findings: Secondary | ICD-10-CM | POA: Diagnosis not present

## 2019-06-24 DIAGNOSIS — I152 Hypertension secondary to endocrine disorders: Secondary | ICD-10-CM

## 2019-06-24 DIAGNOSIS — E785 Hyperlipidemia, unspecified: Secondary | ICD-10-CM

## 2019-06-24 DIAGNOSIS — Z1211 Encounter for screening for malignant neoplasm of colon: Secondary | ICD-10-CM

## 2019-06-24 DIAGNOSIS — I10 Essential (primary) hypertension: Secondary | ICD-10-CM

## 2019-06-24 DIAGNOSIS — E1169 Type 2 diabetes mellitus with other specified complication: Secondary | ICD-10-CM | POA: Diagnosis not present

## 2019-06-24 DIAGNOSIS — E1159 Type 2 diabetes mellitus with other circulatory complications: Secondary | ICD-10-CM | POA: Diagnosis not present

## 2019-06-24 DIAGNOSIS — E1165 Type 2 diabetes mellitus with hyperglycemia: Secondary | ICD-10-CM

## 2019-06-24 DIAGNOSIS — Z8679 Personal history of other diseases of the circulatory system: Secondary | ICD-10-CM

## 2019-06-24 MED ORDER — LOSARTAN POTASSIUM 50 MG PO TABS: 50.0000 mg | ORAL_TABLET | Freq: Every day | ORAL | 3 refills | Status: DC

## 2019-06-24 NOTE — Patient Instructions (Addendum)
   If you have lab work done today you will be contacted with your lab results within the next 2 weeks.  If you have not heard from us then please contact us. The fastest way to get your results is to register for My Chart.   IF you received an x-ray today, you will receive an invoice from [facility]. Please contact [facility] at [phone] with questions or concerns regarding your invoice.   IF you received labwork today, you will receive an invoice from [facility]. Please contact [facility] at [phone] with questions or concerns regarding your invoice.   Our billing staff will not be able to assist you with questions regarding bills from these companies.  You will be contacted with the lab results as soon as they are available. The fastest way to get your results is to activate your My Chart account. Instructions are located on the last page of this paperwork. If you have not heard from us regarding the results in 2 weeks, please contact this office.     Health Maintenance, Male Adopting a healthy lifestyle and getting preventive care are important in promoting health and wellness. Ask your health care provider about:  The right schedule for you to have regular tests and exams.  Things you can do on your own to prevent diseases and keep yourself healthy. What should I know about diet, weight, and exercise? Eat a healthy diet   Eat a diet that includes plenty of vegetables, fruits, low-fat dairy products, and lean protein.  Do not eat a lot of foods that are high in solid fats, added sugars, or sodium. Maintain a healthy weight Body mass index (BMI) is a measurement that can be used to identify possible weight problems. It estimates body fat based on height and weight. Your health care provider can help determine your BMI and help you achieve or maintain a healthy weight. Get regular exercise Get regular exercise. This is one of the most important things you  can do for your health. Most adults should:  Exercise for at least 150 minutes each week. The exercise should increase your heart rate and make you sweat (moderate-intensity exercise).  Do strengthening exercises at least twice a week. This is in addition to the moderate-intensity exercise.  Spend less time sitting. Even light physical activity can be beneficial. Watch cholesterol and blood lipids Have your blood tested for lipids and cholesterol at 63 years of age, then have this test every 5 years. You may need to have your cholesterol levels checked more often if:  Your lipid or cholesterol levels are high.  You are older than 63 years of age.  You are at high risk for heart disease. What should I know about cancer screening? Many types of cancers can be detected early and may often be prevented. Depending on your health history and family history, you may need to have cancer screening at various ages. This may include screening for:  Colorectal cancer.  Prostate cancer.  Skin cancer.  Lung cancer. What should I know about heart disease, diabetes, and high blood pressure? Blood pressure and heart disease  High blood pressure causes heart disease and increases the risk of stroke. This is more likely to develop in people who have high blood pressure readings, are of African descent, or are overweight.  Talk with your health care provider about your target blood pressure readings.  Have your blood pressure checked: ? Every 3-5 years if you are 18-39 years   of age. ? Every year if you are 40 years old or older.  If you are between the ages of 65 and 75 and are a current or former smoker, ask your health care provider if you should have a one-time screening for abdominal aortic aneurysm (AAA). Diabetes Have regular diabetes screenings. This checks your fasting blood sugar level. Have the screening done:  Once every three years after age 45 if you are at a normal weight and have  a low risk for diabetes.  More often and at a younger age if you are overweight or have a high risk for diabetes. What should I know about preventing infection? Hepatitis B If you have a higher risk for hepatitis B, you should be screened for this virus. Talk with your health care provider to find out if you are at risk for hepatitis B infection. Hepatitis C Blood testing is recommended for:  Everyone born from 1945 through 1965.  Anyone with known risk factors for hepatitis C. Sexually transmitted infections (STIs)  You should be screened each year for STIs, including gonorrhea and chlamydia, if: ? You are sexually active and are younger than 63 years of age. ? You are older than 63 years of age and your health care provider tells you that you are at risk for this type of infection. ? Your sexual activity has changed since you were last screened, and you are at increased risk for chlamydia or gonorrhea. Ask your health care provider if you are at risk.  Ask your health care provider about whether you are at high risk for HIV. Your health care provider may recommend a prescription medicine to help prevent HIV infection. If you choose to take medicine to prevent HIV, you should first get tested for HIV. You should then be tested every 3 months for as long as you are taking the medicine. Follow these instructions at home: Lifestyle  Do not use any products that contain nicotine or tobacco, such as cigarettes, e-cigarettes, and chewing tobacco. If you need help quitting, ask your health care provider.  Do not use street drugs.  Do not share needles.  Ask your health care provider for help if you need support or information about quitting drugs. Alcohol use  Do not drink alcohol if your health care provider tells you not to drink.  If you drink alcohol: ? Limit how much you have to 0-2 drinks a day. ? Be aware of how much alcohol is in your drink. In the [place] one drink equals one 12  oz bottle of beer (355 mL), one 5 oz glass of wine (148 mL), or one 1 oz glass of hard liquor (44 mL). General instructions  Schedule regular health, dental, and eye exams.  Stay current with your vaccines.  Tell your health care provider if: ? You often feel depressed. ? You have ever been abused or do not feel safe at home. Summary  Adopting a healthy lifestyle and getting preventive care are important in promoting health and wellness.  Follow your health care provider's instructions about healthy diet, exercising, and getting tested or screened for diseases.  Follow your health care provider's instructions on monitoring your cholesterol and blood pressure. This information is not intended to replace advice given to you by your health care provider. Make sure you discuss any questions you have with your health care provider. Document Released: 01/06/2008 Document Revised: 07/03/2018 Document Reviewed: 07/03/2018 Elsevier Patient Education  2020 Elsevier Inc.  

## 2019-06-24 NOTE — Progress Notes (Signed)
[name] Love 63 y.o.   Chief Complaint  Patient presents with   Annual Exam   Medication Refill    Losartan    HISTORY OF PRESENT ILLNESS: This is a 63 y.o. male here for his annual exam.  Has the following chronic medical problems: 1.  Diabetes: Sees endocrinologist Dr.Gherthe on a regular basis.  Next appointment January 11th. Presently on Farxiga, Trulicity, Metformin, and glipizide as needed. Lab Results  Component Value Date   HGBA1C 7.3 (H) 06/10/2019   2. hypertension: On losartan 50 mg daily.  Doing well 3.  Dyslipidemia: On Lipitor and Vascepa. 4.  History of coronary artery disease: Sees cardiologist regularly.  Doing well.  Status post CABG in the past. Was no complaints or medical concerns today.  Recent blood work results reviewed with patient.  HPI   Prior to Admission medications   Medication Sig Start Date End Date Taking? Authorizing Provider  aspirin EC 81 MG tablet Take 1 tablet (81 mg total) by mouth daily. 07/30/18  Yes Imogene Burn, PA-C  atorvastatin (LIPITOR) 40 MG tablet Take 1 tablet (40 mg total) by mouth daily. 05/27/18  Yes Rockie Vawter, [name], MD  dapagliflozin propanediol (FARXIGA) 10 MG TABS tablet Take 10 mg by mouth daily. 04/02/19  Yes Philemon Kingdom, MD  Dulaglutide (TRULICITY) 1.5 0000000 SOPN INJECT 1.5 MG INTO THE SKIN ONCE A WEEK 04/02/19  Yes Philemon Kingdom, MD  glipiZIDE (GLUCOTROL) 5 MG tablet Take 1 tablet (5 mg total) by mouth daily. 03/21/18  Yes Philemon Kingdom, MD  Icosapent Ethyl (VASCEPA) 1 g CAPS Take 2 capsules (2 g total) by mouth 2 (two) times daily. 07/30/18  Yes Imogene Burn, PA-C  losartan (COZAAR) 50 MG tablet Take 1 tablet by mouth once daily 05/28/19  Yes Tamura Lasky, [name], MD  metFORMIN (GLUCOPHAGE) 1000 MG tablet Take 1 tablet (1,000 mg total) by mouth 2 (two) times daily with a meal. 03/29/19  Yes Philemon Kingdom, MD  OVER THE COUNTER MEDICATION 665 mg.   Yes [provider]    POTASSIUM GLUCONATE PO Take 595 mg by mouth.   Yes [provider]  albuterol (PROVENTIL HFA;VENTOLIN HFA) 108 (90 Base) MCG/ACT inhaler Inhale 1-2 puffs into the lungs every 4 (four) hours as needed for wheezing or shortness of breath. Patient not taking: Reported on 06/24/2019 08/10/18   [name], MD  cetirizine (ZYRTEC) 10 MG tablet Take 1 tablet (10 mg total) by mouth daily. Patient not taking: Reported on 06/24/2019 08/17/17   [name], MD  Continuous Blood Gluc Sensor (FREESTYLE LIBRE 14 DAY SENSOR) MISC 2 each by Subdermal route every 14 (fourteen) days. CHANGE SENSOR EVERY 2 WEEKS AS DIRECTED 11/07/18   Philemon Kingdom, MD  triamcinolone (NASACORT) 55 MCG/ACT AERO nasal inhaler Place 2 sprays into the nose daily. Patient not taking: Reported on 06/24/2019 08/17/17   [name], MD    No Known Allergies  Patient Active Problem List   Diagnosis Date Noted   Decreased calculated GFR 08/08/2017   Erectile dysfunction 09/15/2013   Nonspecific abnormal electrocardiogram (ECG) (EKG) 06/10/2013   Poorly controlled type 2 diabetes mellitus with circulatory disorder ([employer]) 09/12/2012   Coronary artery disease 07/14/2011   History of coronary artery bypass surgery 07/14/2011   Hyperlipidemia 02/23/2009   Obesity 02/23/2009   Essential hypertension 02/23/2009    Past Medical History:  Diagnosis Date   Coronary artery disease    post CABG x3 in 2007   Diabetes mellitus  Hyperlipidemia    Hypertension    Obesity     Past Surgical History:  Procedure Laterality Date   CARDIAC CATHETERIZATION  EF= 45-50%   EF= 45-50% -- Three-vessel coronary artery disease with high-grade lesions in the proximal left anterior descending artery and multiple lesions in the right coronary -- Low normal ejection fraction -- Stable exertional angina -- [name], M.D. [facility]   CORONARY ARTERY BYPASS GRAFT  02/19/2006     x3 (left internal mammary  artery to LAD, left radial artery to posterior descending, saphenous vein graft to first diagonal), endoscopic vein harvest right leg -- SURGEON:  [name]. [name], M.D.   HERNIA REPAIR     at 63 y/o   tripple bypass      Social History   Socioeconomic History   Marital status: Married    Spouse name: Not on file   Number of children: Not on file   Years of education: Not on file   Highest education level: Not on file  Occupational History   Not on file  Social Needs   Financial resource strain: Not on file   Food insecurity    Worry: Not on file    Inability: Not on file   Transportation needs    Medical: Not on file    Non-medical: Not on file  Tobacco Use   Smoking status: Never Smoker   Smokeless tobacco: Never Used  Substance and Sexual Activity   Alcohol use: Yes    Alcohol/week: 1.0 standard drinks    Types: 1 Standard drinks or equivalent per week   Drug use: No   Sexual activity: Yes  Lifestyle   Physical activity    Days per week: Not on file    Minutes per session: Not on file   Stress: Not on file  Relationships   Social connections    Talks on phone: Not on file    Gets together: Not on file    Attends religious service: Not on file    Active member of club or organization: Not on file    Attends meetings of clubs or organizations: Not on file    Relationship status: Not on file   Intimate partner violence    Fear of current or ex partner: Not on file    Emotionally abused: Not on file    Physically abused: Not on file    Forced sexual activity: Not on file  Other Topics Concern   Not on file  Social History Narrative   Not on file    Family History  Problem Relation Age of Onset   Heart disease Mother    Cancer Father      Review of Systems  Constitutional: Negative.  Negative for chills and fever.  HENT: Negative.  Negative for congestion and sore throat.   Respiratory: Negative.  Negative for cough and  shortness of breath.   Cardiovascular: Negative.  Negative for chest pain and palpitations.  Gastrointestinal: Negative.  Negative for abdominal pain, diarrhea, nausea and vomiting.  Genitourinary: Negative.  Negative for dysuria.  Musculoskeletal: Negative.  Negative for back pain, myalgias and neck pain.  Skin: Negative.  Negative for rash.  Neurological: Negative for dizziness and headaches.  All other systems reviewed and are negative.  Today's Vitals   06/24/19 1534  BP: 115/63  Pulse: 87  Resp: 16  Temp: 98.2 F (36.8 C)  TempSrc: Oral  SpO2: 97%  Weight: 222 lb 12.8 oz (101.1 kg)  Height: 5'  10.5" (1.791 m)   Body mass index is 31.52 kg/m.   Physical Exam Vitals signs reviewed.  Constitutional:      Appearance: Normal appearance.  HENT:     Head: Normocephalic.  Eyes:     Extraocular Movements: Extraocular movements intact.     Conjunctiva/sclera: Conjunctivae normal.     Pupils: Pupils are equal, round, and reactive to light.  Neck:     Musculoskeletal: Normal range of motion and neck supple.     Vascular: No carotid bruit.  Cardiovascular:     Rate and Rhythm: Normal rate and regular rhythm.     Pulses: Normal pulses.     Heart sounds: Normal heart sounds.  Pulmonary:     Effort: Pulmonary effort is normal.     Breath sounds: Normal breath sounds.  Abdominal:     General: Bowel sounds are normal. There is no distension.     Palpations: Abdomen is soft.     Tenderness: There is no abdominal tenderness.  Musculoskeletal: Normal range of motion.  Lymphadenopathy:     Cervical: No cervical adenopathy.  Skin:    General: Skin is warm and dry.     Capillary Refill: Capillary refill takes less than 2 seconds.  Neurological:     General: No focal deficit present.     Mental Status: He is alert and oriented to person, place, and time.  Psychiatric:        Mood and Affect: Mood normal.        Behavior: Behavior normal.      ASSESSMENT & PLAN: Brecken  was seen today for annual exam and medication refill.  Diagnoses and all orders for this visit:  Encounter for general adult medical examination with abnormal findings  Essential hypertension -     losartan (COZAAR) 50 MG tablet; Take 1 tablet (50 mg total) by mouth daily.  Hypertension associated with diabetes ([place])  Dyslipidemia associated with type 2 diabetes mellitus ([place]) -     HM Diabetes Foot Exam  History of coronary artery disease  Colon cancer screening -     Cologuard    Patient Instructions       If you have lab work done today you will be contacted with your lab results within the next 2 weeks.  If you have not heard from [place] then please contact us. The fastest way to get your results is to register for My Chart.   IF you received an x-ray today, you will receive an invoice from [facility]. Please contact [facility] at [phone] with questions or concerns regarding your invoice.   IF you received labwork today, you will receive an invoice from [facility]. Please contact [facility] at [phone] with questions or concerns regarding your invoice.   Our billing staff will not be able to assist you with questions regarding bills from these companies.  You will be contacted with the lab results as soon as they are available. The fastest way to get your results is to activate your My Chart account. Instructions are located on the last page of this paperwork. If you have not heard from [place] regarding the results in 2 weeks, please contact this office.      Health Maintenance, Male Adopting a healthy lifestyle and getting preventive care are important in promoting health and wellness. Ask your health care provider about:  The right schedule for you to have regular tests and exams.  Things you can do on your own to prevent diseases  and keep yourself healthy. What should I know about diet, weight, and exercise? Eat a healthy diet   Eat a diet  that includes plenty of vegetables, fruits, low-fat dairy products, and lean protein.  Do not eat a lot of foods that are high in solid fats, added sugars, or sodium. Maintain a healthy weight Body mass index (BMI) is a measurement that can be used to identify possible weight problems. It estimates body fat based on height and weight. Your health care provider can help determine your BMI and help you achieve or maintain a healthy weight. Get regular exercise Get regular exercise. This is one of the most important things you can do for your health. Most adults should:  Exercise for at least 150 minutes each week. The exercise should increase your heart rate and make you sweat (moderate-intensity exercise).  Do strengthening exercises at least twice a week. This is in addition to the moderate-intensity exercise.  Spend less time sitting. Even light physical activity can be beneficial. Watch cholesterol and blood lipids Have your blood tested for lipids and cholesterol at 63 years of age, then have this test every 5 years. You may need to have your cholesterol levels checked more often if:  Your lipid or cholesterol levels are high.  You are older than 63 years of age.  You are at high risk for heart disease. What should I know about cancer screening? Many types of cancers can be detected early and may often be prevented. Depending on your health history and family history, you may need to have cancer screening at various ages. This may include screening for:  Colorectal cancer.  Prostate cancer.  Skin cancer.  Lung cancer. What should I know about heart disease, diabetes, and high blood pressure? Blood pressure and heart disease  High blood pressure causes heart disease and increases the risk of stroke. This is more likely to develop in people who have high blood pressure readings, are of African descent, or are overweight.  Talk with your health care provider about your target  blood pressure readings.  Have your blood pressure checked: ? Every 3-5 years if you are 24-65 years of age. ? Every year if you are 58 years old or older.  If you are between the ages of 51 and 76 and are a current or former smoker, ask your health care provider if you should have a one-time screening for abdominal aortic aneurysm (AAA). Diabetes Have regular diabetes screenings. This checks your fasting blood sugar level. Have the screening done:  Once every three years after age 70 if you are at a normal weight and have a low risk for diabetes.  More often and at a younger age if you are overweight or have a high risk for diabetes. What should I know about preventing infection? Hepatitis B If you have a higher risk for hepatitis B, you should be screened for this virus. Talk with your health care provider to find out if you are at risk for hepatitis B infection. Hepatitis C Blood testing is recommended for:  Everyone born from [birth year] through 1965.  Anyone with known risk factors for hepatitis C. Sexually transmitted infections (STIs)  You should be screened each year for STIs, including gonorrhea and chlamydia, if: ? You are sexually active and are younger than 63 years of age. ? You are older than 63 years of age and your health care provider tells you that you are at risk for this type of infection. ?  Your sexual activity has changed since you were last screened, and you are at increased risk for chlamydia or gonorrhea. Ask your health care provider if you are at risk.  Ask your health care provider about whether you are at high risk for HIV. Your health care provider may recommend a prescription medicine to help prevent HIV infection. If you choose to take medicine to prevent HIV, you should first get tested for HIV. You should then be tested every 3 months for as long as you are taking the medicine. Follow these instructions at home: Lifestyle  Do not use any products that  contain nicotine or tobacco, such as cigarettes, e-cigarettes, and chewing tobacco. If you need help quitting, ask your health care provider.  Do not use street drugs.  Do not share needles.  Ask your health care provider for help if you need support or information about quitting drugs. Alcohol use  Do not drink alcohol if your health care provider tells you not to drink.  If you drink alcohol: ? Limit how much you have to 0-2 drinks a day. ? Be aware of how much alcohol is in your drink. In the [place] one drink equals one 12 oz bottle of beer (355 mL), one 5 oz glass of wine (148 mL), or one 1 oz glass of hard liquor (44 mL). General instructions  Schedule regular health, dental, and eye exams.  Stay current with your vaccines.  Tell your health care provider if: ? You often feel depressed. ? You have ever been abused or do not feel safe at home. Summary  Adopting a healthy lifestyle and getting preventive care are important in promoting health and wellness.  Follow your health care provider's instructions about healthy diet, exercising, and getting tested or screened for diseases.  Follow your health care provider's instructions on monitoring your cholesterol and blood pressure. This information is not intended to replace advice given to you by your health care provider. Make sure you discuss any questions you have with your health care provider. Document Released: 01/06/2008 Document Revised: 07/03/2018 Document Reviewed: 07/03/2018 Elsevier Patient Education  2020 Elsevier Inc.      Agustina Caroli, MD [facility]

## 2019-06-26 ENCOUNTER — Telehealth: Payer: Self-pay | Admitting: *Deleted

## 2019-06-26 NOTE — Telephone Encounter (Signed)
On 06/25/2019, the requisition for Cologuard was faxed to [facility]. Confirmation page 1:55 pm.

## 2019-07-10 ENCOUNTER — Other Ambulatory Visit: Payer: Self-pay | Admitting: Internal Medicine

## 2019-07-29 LAB — COLOGUARD: Cologuard: NEGATIVE

## 2019-07-31 ENCOUNTER — Other Ambulatory Visit: Payer: Self-pay

## 2019-08-04 ENCOUNTER — Encounter: Payer: Self-pay | Admitting: Internal Medicine

## 2019-08-04 ENCOUNTER — Ambulatory Visit: Payer: BC Managed Care – PPO | Admitting: Internal Medicine

## 2019-08-04 ENCOUNTER — Other Ambulatory Visit: Payer: Self-pay

## 2019-08-04 VITALS — BP 148/80 | HR 80 | Ht 70.0 in | Wt 223.0 lb

## 2019-08-04 DIAGNOSIS — E785 Hyperlipidemia, unspecified: Secondary | ICD-10-CM

## 2019-08-04 DIAGNOSIS — E1165 Type 2 diabetes mellitus with hyperglycemia: Secondary | ICD-10-CM

## 2019-08-04 DIAGNOSIS — E1159 Type 2 diabetes mellitus with other circulatory complications: Secondary | ICD-10-CM

## 2019-08-04 DIAGNOSIS — Z6832 Body mass index (BMI) 32.0-32.9, adult: Secondary | ICD-10-CM

## 2019-08-04 DIAGNOSIS — E669 Obesity, unspecified: Secondary | ICD-10-CM

## 2019-08-04 LAB — POCT GLYCOSYLATED HEMOGLOBIN (HGB A1C): Hemoglobin A1C: 6.9 % — AB (ref 4.0–5.6)

## 2019-08-04 MED ORDER — GLIPIZIDE 5 MG PO TABS: 5.0000 mg | ORAL_TABLET | Freq: Every day | ORAL | 3 refills | Status: DC

## 2019-08-04 NOTE — Progress Notes (Addendum)
Patient ID: [name] Love, male   DOB: 10/31/1955, 64 y.o.   MRN: [id number]   This visit occurred during the SARS-CoV-2 public health emergency.  Safety protocols were in place, including screening questions prior to the visit, additional usage of staff PPE, and extensive cleaning of exam room while observing appropriate contact time as indicated for disinfecting solutions.   HPI: [name] Love is a 64 y.o.-year-old male, initially referred by his PCP, Dr. Mitchel Honour, returning for follow-up for DM2, dx in ~2008, non-insulin-dependent, uncontrolled, with long term complications (CAD - s/p CABG in 2007, CKD, PN, DR).  Last visit 4 months ago.  Before last visit, he eliminated orange juice with breakfast and sugars improved.  However, last HbA1c obtained by PCP was higher.  Reviewed HbA1c levels: Lab Results  Component Value Date   HGBA1C 7.3 (H) 06/10/2019   HGBA1C 6.8 (A) 04/02/2019   HGBA1C 7.0 (A) 11/05/2018   HGBA1C 6.5 (A) 07/10/2018   HGBA1C 6.8 (A) 05/27/2018   HGBA1C 6.9 (A) 03/21/2018   HGBA1C 6.4 11/06/2017   HGBA1C 8.2 06/21/2017   HGBA1C 9.5 03/21/2017   HGBA1C 8.8 07/27/2016   HGBA1C 9.3 10/08/2015   HGBA1C 8.6 09/24/2014   HGBA1C 7.0 09/15/2013   HGBA1C 7.2 05/22/2013   HGBA1C 6.7 09/12/2012  He got a steroid inj in shoulder >> 05/10/2017.  Pt is on a regimen of: - Metformin 1000 mg 2x a day, with meals - Glipizide 5 mg before a larger meal  - Farxiga 10 mg before b'fast - Trulicity 1.5 mg weekly-started 05/2017  He checks his sugars more than 4 times a day with his freestyle libre CGM.  We will scan the reports.  Ave: 153+/-34.2 >> 137 +/- 46.7 >> 138 >> 123 >> 137 for the last month Coefficient of variation: 20.9% >> 26.4% >> 19.1% for the last 2 weeks Percent CGM active: 68% >> 59% >> 7% for the last 2 weeks  CGM parameters: -for the last 2 weeks Time in range:  - very low (less than 54): 0% >> 0% - low (54-69): 0% >> 2% >> 0% - normal  range (70-180): 91% >> 93% >> 98%  - high sugars (180-250): 9% >> 5% >> 1% - very high sugars (250-400): 0% >> 0% >> 0%  CGM traces for the last month:    Previously:  Previously:  Lowest sugar was 60s (delayed meal) >> 75; it is unclear at which CBG level he has hypoglycemia unawareness. Highest sugar was 240 >> 365 (erroneous?).  Pt's meals are: - Breakfast: bagel, OJ >> cereals >> occas. Cereals, or eggs  >> stopped OJ - Lunch: soup or sandwich or leftovers - Dinner: meat, veggies - Snacks: not daily  -+ Mild CKD, last BUN/creatinine:  Lab Results  Component Value Date   BUN 14 06/10/2019   BUN 14 05/27/2018   CREATININE 1.18 06/10/2019   CREATININE 0.94 05/27/2018  On losartan 50. -+ HL; last set of lipids: Lab Results  Component Value Date   CHOL 99 (L) 06/10/2019   HDL 31 (L) 06/10/2019   LDLCALC 35 06/10/2019   LDLDIRECT 51 06/21/2017   TRIG 209 (H) 06/10/2019   CHOLHDL 3.2 06/10/2019  On Lipitor 40, omega-3 fatty acids 2000 mg twice a day, Vascepa.  - last eye exam was in 09/2018: Last name DR.  Dr. Alois Cliche. -+ Numbness and tingling in his feet. On ASA 81.  ROS: Constitutional: no weight gain/no weight loss, no fatigue, no subjective hyperthermia, no subjective hypothermia  Eyes: no blurry vision, no xerophthalmia ENT: no sore throat, no nodules palpated in neck, no dysphagia, no odynophagia, no hoarseness Cardiovascular: no CP/no SOB/no palpitations/no leg swelling Respiratory: no cough/no SOB/no wheezing Gastrointestinal: no N/no V/no D/no C/no acid reflux Musculoskeletal: no muscle aches/no joint aches Skin: no rashes, no hair loss Neurological: no tremors/+ numbness/+ tingling/no dizziness  I reviewed pt's medications, allergies, PMH, social hx, family hx, and changes were documented in the history of present illness. Otherwise, unchanged from my initial visit note.  Past Medical History:  Diagnosis Date  . Coronary artery disease    post CABG  x3 in 2007  . Diabetes mellitus   . Hyperlipidemia   . Hypertension   . Obesity    Past Surgical History:  Procedure Laterality Date  . CARDIAC CATHETERIZATION  EF= 45-50%   EF= 45-50% -- Three-vessel coronary artery disease with high-grade lesions in the proximal left anterior descending artery and multiple lesions in the right coronary -- Low normal ejection fraction -- Stable exertional angina -- [name], M.D. [facility]  . CORONARY ARTERY BYPASS GRAFT  02/19/2006     x3 (left internal mammary artery to LAD, left radial artery to posterior descending, saphenous vein graft to first diagonal), endoscopic vein harvest right leg -- SURGEON:  [name]. [name], M.D.  . HERNIA REPAIR     at 64 y/o  . tripple bypass     Social History   Socioeconomic History  . Marital status: Married    Spouse name: Not on file  . Number of children: 3  Social Needs  Occupational History  . unemployed  Tobacco Use  . Smoking status: Never Smoker  . Smokeless tobacco: Never Used  Substance and Sexual Activity  . Alcohol use: Yes    Alcohol/week:     Types: 1-2 per mo  . Drug use: No  .    Other Topics   .   Social History Narrative  .    Current Outpatient Medications on File Prior to Visit  Medication Sig Dispense Refill  . albuterol (PROVENTIL HFA;VENTOLIN HFA) 108 (90 Base) MCG/ACT inhaler Inhale 1-2 puffs into the lungs every 4 (four) hours as needed for wheezing or shortness of breath. (Patient not taking: Reported on 06/24/2019) 1 Inhaler 0  . aspirin EC 81 MG tablet Take 1 tablet (81 mg total) by mouth daily. 90 tablet 3  . atorvastatin (LIPITOR) 40 MG tablet Take 1 tablet (40 mg total) by mouth daily. 90 tablet 3  . cetirizine (ZYRTEC) 10 MG tablet Take 1 tablet (10 mg total) by mouth daily. (Patient not taking: Reported on 06/24/2019) 30 tablet 11  . Continuous Blood Gluc Sensor (FREESTYLE LIBRE 14 DAY SENSOR) MISC 2 each by Subdermal route every 14 (fourteen) days. CHANGE SENSOR  EVERY 2 WEEKS AS DIRECTED 6 each 3  . dapagliflozin propanediol (FARXIGA) 10 MG TABS tablet Take 10 mg by mouth daily. 90 tablet 3  . Dulaglutide (TRULICITY) 1.5 0000000 SOPN INJECT 1.5 MG INTO THE SKIN ONCE A WEEK 4 pen 11  . glipiZIDE (GLUCOTROL) 5 MG tablet Take 1 tablet by mouth once daily 30 tablet 0  . Icosapent Ethyl (VASCEPA) 1 g CAPS Take 2 capsules (2 g total) by mouth 2 (two) times daily. 480 capsule 3  . losartan (COZAAR) 50 MG tablet Take 1 tablet (50 mg total) by mouth daily. 90 tablet 3  . metFORMIN (GLUCOPHAGE) 1000 MG tablet Take 1 tablet (1,000 mg total) by mouth 2 (two) times daily  with a meal. 180 tablet 3  . OVER THE COUNTER MEDICATION 665 mg.    . POTASSIUM GLUCONATE PO Take 595 mg by mouth.    . triamcinolone (NASACORT) 55 MCG/ACT AERO nasal inhaler Place 2 sprays into the nose daily. (Patient not taking: Reported on 06/24/2019) 1 Inhaler 12   No current facility-administered medications on file prior to visit.   No Known Allergies Family History  Problem Relation Age of Onset  . Heart disease Mother   . Cancer Father     PE: BP (!) 148/80   Pulse 80   Ht 5\' 10"  (1.778 m)   Wt 223 lb (101.2 kg)   SpO2 97%   BMI 32.00 kg/m  Wt Readings from Last 3 Encounters:  08/04/19 223 lb (101.2 kg)  06/24/19 222 lb 12.8 oz (101.1 kg)  04/02/19 221 lb (100.2 kg)   Constitutional: overweight, in NAD Eyes: PERRLA, EOMI, no exophthalmos ENT: moist mucous membranes, no thyromegaly, no cervical lymphadenopathy Cardiovascular: RRR, No MRG Respiratory: CTA B Gastrointestinal: abdomen soft, NT, ND, BS+ Musculoskeletal: no deformities, strength intact in all 4 Skin: moist, warm, no rashes Neurological: no tremor with outstretched hands, DTR normal in all 4  ASSESSMENT: 1. DM2, non-insulin-dependent, uncontrolled, without complications - CAD - s/p CABG in 2007 - CKD - PN - DR  2. HL  3. Obesity class I  PLAN:  1. Patient with longstanding, uncontrolled type 2  diabetes, on oral antidiabetic regimen that includes an SGLT 2 inhibitor and also weekly GLP-1 receptor agonist.  At last visit, HbA1c was excellent, at 6.8%, improved, after eliminating orange juice with breakfast.  At that time, we did not change his regimen.  He did have occasional hyperglycemic spikes and we discussed about further improvement in his diet.  However, he had another HbA1c checked by PCP 2 months ago and this was higher, at 7.3%. -At today's visit, we checked HbA1c is: 6.9% (better) -At this visit, we reviewed his sugars at home.  I reviewed the CGM tracings but he does not have many checks that we could download.  The available tracings are in the target range, though.  He had one high blood sugar in the 300s but he felt that this was due to a defective sensor as it happened in the last day of the sensor and corrected after he changed it. -As of now, I did not suggest any changes in his regimen, but we did discuss about improving his diet and hopefully using the glipizide less now that the holidays are over. - I suggested to:  Patient Instructions  Please continue: - Metformin 1000 mg 2x a day, with meals - Glipizide 5 mg before a larger meal  - Farxiga 10 mg before b'fast - Trulicity 1.5 mg weekly  Please return in 3-4 months with your sugar log.  - advised to check sugars at different times of the day - 4x a day, rotating check times - advised for yearly eye exams >> he is UTD - return to clinic in 4 months  2.  Hyperlipidemia -Reviewed latest lipid panel from 05/2019: LDL at goal, improved, triglycerides high, HDL low Lab Results  Component Value Date   CHOL 99 (L) 06/10/2019   HDL 31 (L) 06/10/2019   LDLCALC 35 06/10/2019   LDLDIRECT 51 06/21/2017   TRIG 209 (H) 06/10/2019   CHOLHDL 3.2 06/10/2019  -Continues the statin, omega-3 fatty acids, and Vascepa, without side effects  3.  Obesity class I -Continue SGLT 2  inhibitor and GLP-1 receptor agonist, which both  help with weight loss -He lost 6 pounds before last visit, now gained 2 lbs -Discussed about improving diet and using the glipizide less since this is also weight inducing  Philemon Kingdom, MD PhD [facility]

## 2019-08-04 NOTE — Addendum Note (Signed)
Addended by: Cardell Peach I on: 08/04/2019 11:48 AM   Modules accepted: Orders

## 2019-08-04 NOTE — Patient Instructions (Signed)
Please continue: - Metformin 1000 mg 2x a day, with meals - Glipizide 5 mg before a larger meal  - Farxiga 10 mg before b'fast - Trulicity 1.5 mg weekly  Please return in 3-4 months with your sugar log.

## 2019-08-17 NOTE — Progress Notes (Signed)
Cardiology Office Note   Date:  08/19/2019   ID:  [name], Love May 11, 1956, MRN [id number]  PCP:  [name], MD    No chief complaint on file.  CAD  Wt Readings from Last 3 Encounters:  08/19/19 223 lb (101.2 kg)  08/04/19 223 lb (101.2 kg)  06/24/19 222 lb 12.8 oz (101.1 kg)       History of Present Illness: [name] Love is a 64 y.o. male  who had CABG in 2007. He had a nuclear stress test in 2012 that was ok.  Echo in 2014: Left ventricle: The cavity size was normal. There was mild focal basal hypertrophy of the septum. Systolic function was normal. The estimated ejection fraction was in the range of 55% to 60%. Wall motion was normal; there were no regional wall motion abnormalities. - Left atrium: The atrium was mildly dilated.  He does the elliptical on a fairly regular basis.  No cardiac sx.  He has to rehab his shoulders.    He was to get platelet rich plasma plasma injections for his hamstrings.  Since the last visit, no cardiac issues.  Denies : Chest pain. Dizziness. Leg edema. Nitroglycerin use. Orthopnea. Palpitations. Paroxysmal nocturnal dyspnea. Shortness of breath. Syncope.   Less exercise.  Plays some golf when it is warm.   Past Medical History:  Diagnosis Date  . Coronary artery disease    post CABG x3 in 2007  . Diabetes mellitus   . Hyperlipidemia   . Hypertension   . Obesity     Past Surgical History:  Procedure Laterality Date  . CARDIAC CATHETERIZATION  EF= 45-50%   EF= 45-50% -- Three-vessel coronary artery disease with high-grade lesions in the proximal left anterior descending artery and multiple lesions in the right coronary -- Low normal ejection fraction -- Stable exertional angina -- [name], M.D. [facility]  . CORONARY ARTERY BYPASS GRAFT  02/19/2006     x3 (left internal mammary artery to LAD, left radial artery to posterior descending, saphenous vein graft to first  diagonal), endoscopic vein harvest right leg -- SURGEON:  [name]. [name], M.D.  . HERNIA REPAIR     at 64 y/o  . tripple bypass       Current Outpatient Medications  Medication Sig Dispense Refill  . albuterol (PROVENTIL HFA;VENTOLIN HFA) 108 (90 Base) MCG/ACT inhaler Inhale 1-2 puffs into the lungs every 4 (four) hours as needed for wheezing or shortness of breath. 1 Inhaler 0  . aspirin EC 81 MG tablet Take 1 tablet (81 mg total) by mouth daily. 90 tablet 3  . atorvastatin (LIPITOR) 40 MG tablet Take 1 tablet (40 mg total) by mouth daily. 90 tablet 3  . cetirizine (ZYRTEC) 10 MG tablet Take 1 tablet (10 mg total) by mouth daily. 30 tablet 11  . Continuous Blood Gluc Sensor (FREESTYLE LIBRE 14 DAY SENSOR) MISC 2 each by Subdermal route every 14 (fourteen) days. CHANGE SENSOR EVERY 2 WEEKS AS DIRECTED 6 each 3  . dapagliflozin propanediol (FARXIGA) 10 MG TABS tablet Take 10 mg by mouth daily. 90 tablet 3  . Dulaglutide (TRULICITY) 1.5 0000000 SOPN INJECT 1.5 MG INTO THE SKIN ONCE A WEEK 4 pen 11  . glipiZIDE (GLUCOTROL) 5 MG tablet Take 1 tablet (5 mg total) by mouth daily. 90 tablet 3  . Icosapent Ethyl (VASCEPA) 1 g CAPS Take 2 capsules (2 g total) by mouth 2 (two) times daily. 480 capsule 3  . losartan (COZAAR) 50  MG tablet Take 1 tablet (50 mg total) by mouth daily. 90 tablet 3  . metFORMIN (GLUCOPHAGE) 1000 MG tablet Take 1 tablet (1,000 mg total) by mouth 2 (two) times daily with a meal. 180 tablet 3  . OVER THE COUNTER MEDICATION 665 mg.    . POTASSIUM GLUCONATE PO Take 595 mg by mouth.    . triamcinolone (NASACORT) 55 MCG/ACT AERO nasal inhaler Place 2 sprays into the nose daily. (Patient not taking: Reported on 08/19/2019) 1 Inhaler 12   No current facility-administered medications for this visit.    Allergies:   Patient has no known allergies.    Social History:  The patient  reports that he has never smoked. He has never used smokeless tobacco. He reports current  alcohol use of about 1.0 standard drinks of alcohol per week. He reports that he does not use drugs.   Family History:  The patient's family history includes Cancer in his father; Heart disease in his mother.    ROS:  Please see the history of present illness.   Otherwise, review of systems are positive for less exercise.   All other systems are reviewed and negative.    PHYSICAL EXAM: VS:  BP 110/60   Pulse 83   Ht 5\' 10"  (1.778 m)   Wt 223 lb (101.2 kg)   SpO2 95%   BMI 32.00 kg/m  , BMI Body mass index is 32 kg/m. GEN: Well nourished, well developed, in no acute distress  HEENT: normal  Neck: no JVD, carotid bruits, or masses Cardiac: RRR; no murmurs, rubs, or gallops,no edema  Respiratory:  clear to auscultation bilaterally, normal work of breathing GI: soft, nontender, nondistended, + BS MS: no deformity or atrophy  Skin: warm and dry, no rash Neuro:  Strength and sensation are intact Psych: euthymic mood, full affect   EKG:   The ekg ordered today demonstrates NSR, diffuse anterolateral ST changes- unchanged from prior   Recent Labs: 06/10/2019: ALT 27; BUN 14; Creatinine, Ser 1.18; Hemoglobin 13.3; Platelets 205; Potassium 4.8; Sodium 139   Lipid Panel    Component Value Date/Time   CHOL 99 (L) 06/10/2019 1143   TRIG 209 (H) 06/10/2019 1143   HDL 31 (L) 06/10/2019 1143   CHOLHDL 3.2 06/10/2019 1143   CHOLHDL 5.4 (H) 10/08/2015 1051   VLDL 76 (H) 10/08/2015 1051   LDLCALC 35 06/10/2019 1143   LDLDIRECT 51 06/21/2017 1036     Other studies Reviewed: Additional studies/ records that were reviewed today with results demonstrating: labs reviewed.   ASSESSMENT AND PLAN:  1. CAD: Continue aggressive secondary prevention.  He has preferred taking the half tablet of the adult aspirin.  No angina.  Exercise target as noted below. 2. Hyperlipidemia: Atorvastatin for lipid-lowering therapy.  He has had low LDL.  He was started on Vascepa, but did not have a big drop  and given the cost, will stop this and start regular fish oil 3 gram BID.s 3. Abnormal ECG: He has had chronic T wave inversions in the inferolateral leads. 4. HTN: The current medical regimen is effective;  continue present plan and medications. 5. DM: A1C 6.9 in 05/2019. COntinue healthy, plant based diet.   Current medicines are reviewed at length with the patient today.  The patient concerns regarding his medicines were addressed.  The following changes have been made:  No change  Labs/ tests ordered today include:  No orders of the defined types were placed in this encounter.   Recommend  150 minutes/week of aerobic exercise Low fat, low carb, high fiber diet recommended  Disposition:   FU in 1 year   Signed, Larae Grooms, MD  08/19/2019 4:00 PM    [facility] [place], [place], [place]  [address] Phone: [phone]; Fax: [phone]

## 2019-08-19 ENCOUNTER — Other Ambulatory Visit: Payer: Self-pay

## 2019-08-19 ENCOUNTER — Encounter: Payer: Self-pay | Admitting: Interventional Cardiology

## 2019-08-19 ENCOUNTER — Ambulatory Visit: Payer: BC Managed Care – PPO | Admitting: Interventional Cardiology

## 2019-08-19 VITALS — BP 110/60 | HR 83 | Ht 70.0 in | Wt 223.0 lb

## 2019-08-19 DIAGNOSIS — E785 Hyperlipidemia, unspecified: Secondary | ICD-10-CM

## 2019-08-19 DIAGNOSIS — I10 Essential (primary) hypertension: Secondary | ICD-10-CM

## 2019-08-19 DIAGNOSIS — I2510 Atherosclerotic heart disease of native coronary artery without angina pectoris: Secondary | ICD-10-CM

## 2019-08-19 DIAGNOSIS — E1165 Type 2 diabetes mellitus with hyperglycemia: Secondary | ICD-10-CM

## 2019-08-19 DIAGNOSIS — E1159 Type 2 diabetes mellitus with other circulatory complications: Secondary | ICD-10-CM | POA: Diagnosis not present

## 2019-08-19 MED ORDER — FISH OIL 1000 MG PO CAPS: 3.0000 | ORAL_CAPSULE | Freq: Two times a day (BID) | ORAL | 11 refills | Status: DC

## 2019-08-19 NOTE — Patient Instructions (Signed)
Medication Instructions:  Your physician has recommended you make the following change in your medication:   Finish up supply of Vascepa and then start fish oil 3 grams by mouth twice a day  *If you need a refill on your cardiac medications before your next appointment, please call your pharmacy*  Lab Work: None ordered  If you have labs (blood work) drawn today and your tests are completely normal, you will receive your results only by: Marland Kitchen MyChart Message (if you have MyChart) OR . A paper copy in the mail If you have any lab test that is abnormal or we need to change your treatment, we will call you to review the results.  Testing/Procedures: None ordered  Follow-Up: At [facility], you and your health needs are our priority.  As part of our continuing mission to provide you with exceptional heart care, we have created designated Provider Care Teams.  These Care Teams include your primary Cardiologist (physician) and Advanced Practice Providers (APPs -  Physician Assistants and Nurse Practitioners) who all work together to provide you with the care you need, when you need it.  Your next appointment:   12 month(s)  The format for your next appointment:   In Person  Provider:   You may see Larae Grooms, MD or one of the following Advanced Practice Providers on your designated Care Team:    Melina Copa, PA-C  Ermalinda Barrios, PA-C   Other Instructions

## 2019-09-01 ENCOUNTER — Other Ambulatory Visit: Payer: Self-pay | Admitting: Emergency Medicine

## 2019-09-01 ENCOUNTER — Encounter: Payer: Self-pay | Admitting: Emergency Medicine

## 2019-09-01 ENCOUNTER — Telehealth: Payer: Self-pay | Admitting: Emergency Medicine

## 2019-09-01 DIAGNOSIS — E785 Hyperlipidemia, unspecified: Secondary | ICD-10-CM

## 2019-09-01 DIAGNOSIS — E1165 Type 2 diabetes mellitus with hyperglycemia: Secondary | ICD-10-CM

## 2019-09-01 MED ORDER — ATORVASTATIN CALCIUM 40 MG PO TABS: 40.0000 mg | ORAL_TABLET | Freq: Every day | ORAL | 0 refills | Status: DC

## 2019-09-01 NOTE — Telephone Encounter (Signed)
Pt needs refill on atorvastatin 40 mg #90 w/refills send to [facility]

## 2019-09-01 NOTE — Telephone Encounter (Signed)
Please schedule appt for refills.  Day supply has been sent but no further refills without appt

## 2019-09-02 ENCOUNTER — Other Ambulatory Visit: Payer: Self-pay | Admitting: Emergency Medicine

## 2019-09-02 DIAGNOSIS — E1165 Type 2 diabetes mellitus with hyperglycemia: Secondary | ICD-10-CM

## 2019-09-02 DIAGNOSIS — E785 Hyperlipidemia, unspecified: Secondary | ICD-10-CM

## 2019-09-02 MED ORDER — ATORVASTATIN CALCIUM 40 MG PO TABS: 40.0000 mg | ORAL_TABLET | Freq: Every day | ORAL | 0 refills | Status: DC

## 2019-09-02 NOTE — Telephone Encounter (Signed)
Pt stated that he does not feel the need for the appointment for the refills , says he has never had to have an appointment for just refills and has only seen doctor sagardia once a year. He says that if the provider wants him to keep taking it he will should let him know , ... pt refusing appointment

## 2019-09-03 ENCOUNTER — Encounter: Payer: Self-pay | Admitting: Emergency Medicine

## 2019-09-26 ENCOUNTER — Encounter: Payer: Self-pay | Admitting: Internal Medicine

## 2019-10-09 ENCOUNTER — Encounter: Payer: Self-pay | Admitting: Internal Medicine

## 2019-11-28 ENCOUNTER — Other Ambulatory Visit: Payer: Self-pay

## 2019-12-02 ENCOUNTER — Ambulatory Visit: Payer: BC Managed Care – PPO | Admitting: Internal Medicine

## 2019-12-02 ENCOUNTER — Other Ambulatory Visit: Payer: Self-pay

## 2019-12-02 ENCOUNTER — Encounter: Payer: Self-pay | Admitting: Internal Medicine

## 2019-12-02 VITALS — BP 130/70 | HR 78 | Ht 70.0 in | Wt 225.0 lb

## 2019-12-02 DIAGNOSIS — E1165 Type 2 diabetes mellitus with hyperglycemia: Secondary | ICD-10-CM | POA: Diagnosis not present

## 2019-12-02 DIAGNOSIS — E1159 Type 2 diabetes mellitus with other circulatory complications: Secondary | ICD-10-CM

## 2019-12-02 DIAGNOSIS — E669 Obesity, unspecified: Secondary | ICD-10-CM

## 2019-12-02 DIAGNOSIS — E785 Hyperlipidemia, unspecified: Secondary | ICD-10-CM

## 2019-12-02 DIAGNOSIS — Z6832 Body mass index (BMI) 32.0-32.9, adult: Secondary | ICD-10-CM

## 2019-12-02 LAB — POCT GLYCOSYLATED HEMOGLOBIN (HGB A1C): Hemoglobin A1C: 6.9 % — AB (ref 4.0–5.6)

## 2019-12-02 MED ORDER — TRULICITY 3 MG/0.5ML ~~LOC~~ SOAJ: 3.0000 mg | SUBCUTANEOUS | 3 refills | Status: DC

## 2019-12-02 NOTE — Addendum Note (Signed)
Addended by: Cardell Peach I on: 12/02/2019 01:29 PM   Modules accepted: Orders

## 2019-12-02 NOTE — Patient Instructions (Addendum)
Please continue: - Metformin 1000 mg 2x a day, with meals - Farxiga 10 mg before b'fast  Please increase: - Trulicity 3 mg weekly  Hold Glipizide.  Please return in 4 months.

## 2019-12-02 NOTE — Progress Notes (Signed)
Patient ID: [name] Love, male   DOB: [birth year], 64 y.o.   MRN: [id number]   This visit occurred during the SARS-CoV-2 public health emergency.  Safety protocols were in place, including screening questions prior to the visit, additional usage of staff PPE, and extensive cleaning of exam room while observing appropriate contact time as indicated for disinfecting solutions.   HPI: [name] Love is a 64 y.o.-year-old male, initially referred by his PCP, Dr. Mitchel Honour, returning for follow-up for DM2, dx in ~2008, non-insulin-dependent, uncontrolled, with long term complications (CAD - s/p CABG in 2007, CKD, PN, DR).  Last visit 4 months ago.  Reviewed HbA1c levels: Lab Results  Component Value Date   HGBA1C 6.9 (A) 08/04/2019   HGBA1C 7.3 (H) 06/10/2019   HGBA1C 6.8 (A) 04/02/2019   HGBA1C 7.0 (A) 11/05/2018   HGBA1C 6.5 (A) 07/10/2018   HGBA1C 6.8 (A) 05/27/2018   HGBA1C 6.9 (A) 03/21/2018   HGBA1C 6.4 11/06/2017   HGBA1C 8.2 06/21/2017   HGBA1C 9.5 03/21/2017   HGBA1C 8.8 07/27/2016   HGBA1C 9.3 10/08/2015   HGBA1C 8.6 09/24/2014   HGBA1C 7.0 09/15/2013   HGBA1C 7.2 05/22/2013  He got a steroid inj in shoulder >> 05/10/2017.  Pt is on a regimen of: - Metformin 1000 mg 2x a day, with meals - Glipizide 5 mg before a larger meal  - Farxiga 10 mg before b'fast - Trulicity 1.5 mg weekly-started 05/2017  He checks his sugars more than 4 times a day with his freestyle libre CGM.  We will scann the records.  Ave: 123 >> 137 >> 131 Coefficient of variation: 26.4% >> 19.1% >> 23.1% for the last 2 weeks CGM active: 59% >> 7%>> 51% for the last 2 weeks  CGM parameters: -for the last 2 weeks  Time in range:  - very low (less than 54): 0% >> 0% >> 0% - low (54-69): 0% >> 2% >> 0% >> 5% - normal range (70-180): 91% >> 93% >> 98% >> 94% - high sugars (180-250): 9% >> 5% >> 1% >> 1% - very high sugars (250-400): 0% >> 0% >> 0% >> 0%    Previously:   Lowest  sugar was 60s (delayed meal) >> 75 >> 70s; it is unclear at which CBG level he has hypoglycemia unawareness. Highest sugar was 240 >> 365 (erroneous?) >> 200s.  Pt's meals are: - Breakfast: bagel, OJ >> cereals >> occas. Cereals, or eggs  >> stopped OJ - Lunch: soup or sandwich or leftovers - Dinner: meat, veggies - Snacks: not daily  -+ Mild CKD, last BUN/creatinine:  Lab Results  Component Value Date   BUN 14 06/10/2019   BUN 14 05/27/2018   CREATININE 1.18 06/10/2019   CREATININE 0.94 05/27/2018  On losartan 50.  -+ HL; last set of lipids: Lab Results  Component Value Date   CHOL 99 (L) 06/10/2019   HDL 31 (L) 06/10/2019   LDLCALC 35 06/10/2019   LDLDIRECT 51 06/21/2017   TRIG 209 (H) 06/10/2019   CHOLHDL 3.2 06/10/2019  On Lipitor 40, omega-3 fatty acids 2000 mg twice a day, Vascepa.  - last eye exam was in 12/2018: + DR. Dr. Alois Cliche.  -+ Numbness and tingling in his feet. On ASA 81.  ROS: Constitutional: no weight gain/no weight loss, no fatigue, no subjective hyperthermia, no subjective hypothermia Eyes: no blurry vision, no xerophthalmia ENT: no sore throat, no nodules palpated in neck, no dysphagia, no odynophagia, no hoarseness Cardiovascular: no CP/no SOB/no palpitations/no leg  swelling Respiratory: no cough/no SOB/no wheezing Gastrointestinal: no N/no V/no D/no C/no acid reflux Musculoskeletal: no muscle aches/no joint aches Skin: no rashes, no hair loss Neurological: no tremors/+ numbness/+ tingling/no dizziness  I reviewed pt's medications, allergies, PMH, social hx, family hx, and changes were documented in the history of present illness. Otherwise, unchanged from my initial visit note.  Past Medical History:  Diagnosis Date  . Coronary artery disease    post CABG x3 in 2007  . Diabetes mellitus   . Hyperlipidemia   . Hypertension   . Obesity    Past Surgical History:  Procedure Laterality Date  . CARDIAC CATHETERIZATION  EF= 45-50%   EF=  45-50% -- Three-vessel coronary artery disease with high-grade lesions in the proximal left anterior descending artery and multiple lesions in the right coronary -- Low normal ejection fraction -- Stable exertional angina -- [name], M.D. [facility]  . CORONARY ARTERY BYPASS GRAFT  02/19/2006     x3 (left internal mammary artery to LAD, left radial artery to posterior descending, saphenous vein graft to first diagonal), endoscopic vein harvest right leg -- SURGEON:  [name]. [name], M.D.  . HERNIA REPAIR     at 64 y/o  . tripple bypass     Social History   Socioeconomic History  . Marital status: Married    Spouse name: Not on file  . Number of children: 3  Social Needs  Occupational History  . unemployed  Tobacco Use  . Smoking status: Never Smoker  . Smokeless tobacco: Never Used  Substance and Sexual Activity  . Alcohol use: Yes    Alcohol/week:     Types: 1-2 per mo  . Drug use: No  .    Other Topics   .   Social History Narrative  .    Current Outpatient Medications on File Prior to Visit  Medication Sig Dispense Refill  . albuterol (PROVENTIL HFA;VENTOLIN HFA) 108 (90 Base) MCG/ACT inhaler Inhale 1-2 puffs into the lungs every 4 (four) hours as needed for wheezing or shortness of breath. 1 Inhaler 0  . aspirin EC 81 MG tablet Take 1 tablet (81 mg total) by mouth daily. 90 tablet 3  . atorvastatin (LIPITOR) 40 MG tablet Take 1 tablet (40 mg total) by mouth daily. 90 tablet 0  . cetirizine (ZYRTEC) 10 MG tablet Take 1 tablet (10 mg total) by mouth daily. 30 tablet 11  . Continuous Blood Gluc Sensor (FREESTYLE LIBRE 14 DAY SENSOR) MISC 2 each by Subdermal route every 14 (fourteen) days. CHANGE SENSOR EVERY 2 WEEKS AS DIRECTED 6 each 3  . dapagliflozin propanediol (FARXIGA) 10 MG TABS tablet Take 10 mg by mouth daily. 90 tablet 3  . Dulaglutide (TRULICITY) 1.5 0000000 SOPN INJECT 1.5 MG INTO THE SKIN ONCE A WEEK 4 pen 11  . glipiZIDE (GLUCOTROL) 5 MG tablet Take 1  tablet (5 mg total) by mouth daily. 90 tablet 3  . losartan (COZAAR) 50 MG tablet Take 1 tablet (50 mg total) by mouth daily. 90 tablet 3  . metFORMIN (GLUCOPHAGE) 1000 MG tablet Take 1 tablet (1,000 mg total) by mouth 2 (two) times daily with a meal. 180 tablet 3  . Omega-3 Fatty Acids (FISH OIL) 1000 MG CAPS Take 3 capsules (3,000 mg total) by mouth 2 (two) times daily. 180 capsule 11  . OVER THE COUNTER MEDICATION 665 mg.    . POTASSIUM GLUCONATE PO Take 595 mg by mouth.    . triamcinolone (NASACORT) 55 MCG/ACT AERO  nasal inhaler Place 2 sprays into the nose daily. (Patient not taking: Reported on 08/19/2019) 1 Inhaler 12   No current facility-administered medications on file prior to visit.   No Known Allergies Family History  Problem Relation Age of Onset  . Heart disease Mother   . Cancer Father     PE: BP 130/70   Pulse 78   Ht 5\' 10"  (1.778 m)   Wt 225 lb (102.1 kg)   SpO2 96%   BMI 32.28 kg/m  Wt Readings from Last 3 Encounters:  12/02/19 225 lb (102.1 kg)  08/19/19 223 lb (101.2 kg)  08/04/19 223 lb (101.2 kg)   Constitutional: overweight, in NAD Eyes: PERRLA, EOMI, no exophthalmos ENT: moist mucous membranes, no thyromegaly, no cervical lymphadenopathy Cardiovascular: RRR, No MRG Respiratory: CTA B Gastrointestinal: abdomen soft, NT, ND, BS+ Musculoskeletal: no deformities, strength intact in all 4 Skin: moist, warm, no rashes Neurological: no tremor with outstretched hands, DTR normal in all 4  ASSESSMENT: 1. DM2, non-insulin-dependent, uncontrolled, without complications - CAD - s/p CABG in 2007 - CKD - PN - DR  2. HL  3. Obesity class I  PLAN:  1. Patient with longstanding, uncontrolled, type 2 diabetes, on oral antidiabetic regimen with Metformin, sulfonylurea, and SGLT 2 inhibitor and also weekly GLP-1 receptor agonist.  At last visit, HbA1c was at goal, at 6.9%.  At that time, the available tracings were in target range but he did not have too many  checks and we had gaps in the data.  We did not change his regimen at that time as he was working on improving his diet, which was less controlled during the holidays. -Since last visit, sugars are still mostly well controlled, with  hyperglycemic spikes after breakfast and occasionally after dinner.  For now, we discussed about increasing Trulicity dose and holding glipizide.  He is using glipizide only when he last ate more with this at the meals, but I advised him to hold it for now to avoid hypoglycemia. - I suggested to:  Patient Instructions  Please continue: - Metformin 1000 mg 2x a day, with meals - Farxiga 10 mg before b'fast  Please increase: - Trulicity 3 mg weekly  Hold Glipizide.  Please return in 4 months.  - we checked his HbA1c: 6.9% (stable) - advised to check sugars at different times of the day - 4x a day, rotating check times - advised for yearly eye exams >> he is UTD - return to clinic in 4 months  2.  Hyperlipidemia -Reviewed latest lipid panel from 05/2019: LDL at goal, triglycerides high, HDL low Lab Results  Component Value Date   CHOL 99 (L) 06/10/2019   HDL 31 (L) 06/10/2019   LDLCALC 35 06/10/2019   LDLDIRECT 51 06/21/2017   TRIG 209 (H) 06/10/2019   CHOLHDL 3.2 06/10/2019  -Continues the statin, omega-3 fatty acids, and Vascepa, without side effects.  3.  Obesity class I -Continue SGLT debrider and GLP-1 receptor agonist, which should both help with weight loss.  We will increase the dose of the GLP-1 receptor agonist, which should further help. -stable weight at this visit  Philemon Kingdom, MD PhD [facility] Endocrinology

## 2019-12-11 ENCOUNTER — Other Ambulatory Visit: Payer: Self-pay | Admitting: Internal Medicine

## 2019-12-28 ENCOUNTER — Other Ambulatory Visit: Payer: Self-pay | Admitting: Emergency Medicine

## 2019-12-28 DIAGNOSIS — E785 Hyperlipidemia, unspecified: Secondary | ICD-10-CM

## 2019-12-28 DIAGNOSIS — E1165 Type 2 diabetes mellitus with hyperglycemia: Secondary | ICD-10-CM

## 2019-12-28 NOTE — Telephone Encounter (Signed)
Requested Prescriptions  Pending Prescriptions Disp Refills  . atorvastatin (LIPITOR) 40 MG tablet [Pharmacy Med Name: Atorvastatin Calcium 40 MG Oral Tablet] 90 tablet 0    Sig: Take 1 tablet by mouth once daily     Cardiovascular:  Antilipid - Statins Failed - 12/28/2019 11:42 AM      Failed - Total Cholesterol in normal range and within 360 days    Cholesterol, Total  Date Value Ref Range Status  06/10/2019 99 (L) 100 - 199 mg/dL Final         Failed - LDL in normal range and within 360 days    LDL Chol Calc (NIH)  Date Value Ref Range Status  06/10/2019 35 0 - 99 mg/dL Final   LDL Direct  Date Value Ref Range Status  06/21/2017 51 0 - 99 mg/dL Final         Failed - HDL in normal range and within 360 days    HDL  Date Value Ref Range Status  06/10/2019 31 (L) >39 mg/dL Final         Failed - Triglycerides in normal range and within 360 days    Triglycerides  Date Value Ref Range Status  06/10/2019 209 (H) 0 - 149 mg/dL Final         Passed - Patient is not pregnant      Passed - Valid encounter within last 12 months    Recent Outpatient Visits          6 months ago Encounter for general adult medical examination with abnormal findings   [facility] at [place], [place], MD   6 months ago Dyslipidemia associated with type 2 diabetes mellitus [facility])   Primary Care at [facility], [name], MD   1 year ago Cough   [facility] at [facility], Ranell Patrick, MD   1 year ago Routine general medical examination at a health care facility   [facility] Care at [facility], [name], MD   2 years ago Acute upper respiratory infection   Primary Care at [facility], [place], MD

## 2020-01-06 LAB — HM DIABETES EYE EXAM

## 2020-03-24 ENCOUNTER — Other Ambulatory Visit: Payer: Self-pay | Admitting: Internal Medicine

## 2020-03-24 ENCOUNTER — Other Ambulatory Visit: Payer: Self-pay | Admitting: Emergency Medicine

## 2020-03-24 DIAGNOSIS — IMO0002 Reserved for concepts with insufficient information to code with codable children: Secondary | ICD-10-CM

## 2020-03-24 DIAGNOSIS — E785 Hyperlipidemia, unspecified: Secondary | ICD-10-CM

## 2020-03-24 DIAGNOSIS — E1165 Type 2 diabetes mellitus with hyperglycemia: Secondary | ICD-10-CM

## 2020-04-05 ENCOUNTER — Ambulatory Visit: Payer: BC Managed Care – PPO | Admitting: Internal Medicine

## 2020-04-21 ENCOUNTER — Other Ambulatory Visit: Payer: Self-pay | Admitting: Internal Medicine

## 2020-05-12 ENCOUNTER — Encounter: Payer: Self-pay | Admitting: Internal Medicine

## 2020-05-12 ENCOUNTER — Other Ambulatory Visit: Payer: Self-pay

## 2020-05-12 ENCOUNTER — Ambulatory Visit: Payer: BC Managed Care – PPO | Admitting: Internal Medicine

## 2020-05-12 ENCOUNTER — Ambulatory Visit (INDEPENDENT_AMBULATORY_CARE_PROVIDER_SITE_OTHER): Payer: BC Managed Care – PPO | Admitting: Emergency Medicine

## 2020-05-12 VITALS — BP 144/78 | HR 67 | Ht 70.0 in | Wt 222.6 lb

## 2020-05-12 DIAGNOSIS — E1159 Type 2 diabetes mellitus with other circulatory complications: Secondary | ICD-10-CM | POA: Diagnosis not present

## 2020-05-12 DIAGNOSIS — E785 Hyperlipidemia, unspecified: Secondary | ICD-10-CM | POA: Diagnosis not present

## 2020-05-12 DIAGNOSIS — E669 Obesity, unspecified: Secondary | ICD-10-CM

## 2020-05-12 DIAGNOSIS — Z6832 Body mass index (BMI) 32.0-32.9, adult: Secondary | ICD-10-CM

## 2020-05-12 DIAGNOSIS — E1165 Type 2 diabetes mellitus with hyperglycemia: Secondary | ICD-10-CM

## 2020-05-12 DIAGNOSIS — Z23 Encounter for immunization: Secondary | ICD-10-CM | POA: Diagnosis not present

## 2020-05-12 LAB — POCT GLYCOSYLATED HEMOGLOBIN (HGB A1C): Hemoglobin A1C: 6.9 % — AB (ref 4.0–5.6)

## 2020-05-12 NOTE — Progress Notes (Signed)
Patient ID: [name] Love, male   DOB: [birth year], 64 y.o.   MRN: [id number]   This visit occurred during the SARS-CoV-2 public health emergency.  Safety protocols were in place, including screening questions prior to the visit, additional usage of staff PPE, and extensive cleaning of exam room while observing appropriate contact time as indicated for disinfecting solutions.    HPI: [name] Love is a 64 y.o.-year-old male, initially referred by his PCP, Dr. Mitchel Honour, returning for follow-up for DM2, dx in ~2008, non-insulin-dependent, uncontrolled, with long term complications (CAD - s/p CABG in 2007, CKD, PN, DR).  Last visit 5 months ago.  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  Reviewed HbA1c levels: Lab Results  Component Value Date   HGBA1C 6.9 (A) 12/02/2019   HGBA1C 6.9 (A) 08/04/2019   HGBA1C 7.3 (H) 06/10/2019   HGBA1C 6.8 (A) 04/02/2019   HGBA1C 7.0 (A) 11/05/2018   HGBA1C 6.5 (A) 07/10/2018   HGBA1C 6.8 (A) 05/27/2018   HGBA1C 6.9 (A) 03/21/2018   HGBA1C 6.4 11/06/2017   HGBA1C 8.2 06/21/2017   HGBA1C 9.5 03/21/2017   HGBA1C 8.8 07/27/2016   HGBA1C 9.3 10/08/2015   HGBA1C 8.6 09/24/2014   HGBA1C 7.0 09/15/2013  He got a steroid inj in shoulder >> 05/10/2017.  Pt is on a regimen of: - Metformin 1000 mg 2x a day, with meals -  >> stopped 05/21 - Farxiga 10 mg before b'fast - Trulicity 1.5 mg weekly-started 05/2017 >> 3 mg weekly - no GI intolerance  He checks his sugars more than 4 times a day with his freestyle libre CGM.  CGM parameters: Ave: 123 >> 137 >> 131 >> 136 Coefficient of variation: 26.4% >> 19.1% >> 23.1% for the last 2 weeks >> 25% CGM active: 59% >> 7%>> 51% for the last 2 weeks >> 71%  Time in range:  - very low (less than 54): 0% >> 0% >> 0% >> 0% - low (54-69): 0% >> 2% >> 0% >> 5% >> 1% - normal range (70-180): 91% >>  93% >> 98% >> 94% >> 88% - high sugars (180-250): 9% >> 5% >> 1% >> 1% >> 10% - very high sugars (250-400): 0% >> 0% >> 0% >> 0% >> 1%    Previously:   Lowest sugar was 60s (delayed meal) >> 75 >> 70s >> 66 (Libre); it is unclear at which CBG level he has hypoglycemia awareness. Highest sugar was 240 >> 365 (erroneous?) >> 200s >> 270.  Pt's meals are: - Breakfast: bagel, OJ >> cereals >> occas. Cereals, or eggs  >> stopped OJ - Lunch: soup or sandwich or leftovers - Dinner: meat, veggies - Snacks: not daily  -+ Mild CKD, last BUN/creatinine:  Lab Results  Component Value Date   BUN 14 06/10/2019   BUN 14 05/27/2018   CREATININE 1.18 06/10/2019   CREATININE 0.94 05/27/2018  On losartan 50.  -+ HL; last set of lipids: Lab Results  Component Value Date   CHOL 99 (L) 06/10/2019   HDL 31 (L) 06/10/2019   LDLCALC 35 06/10/2019   LDLDIRECT 51 06/21/2017   TRIG 209 (H) 06/10/2019   CHOLHDL 3.2 06/10/2019  On Lipitor 40, omega-3 fatty acids 2000 mg twice a day, also Vascepa.  - last eye exam was in 12/2019: No DR reportedly.  However, in 12/2018, it was mentioned that he did have  retinopathy.  Dr. Alois Cliche.  -he has Numbness and tingling in his feet.  On ASA 81.  ROS: Constitutional: no weight gain/no weight loss, no fatigue, no subjective hyperthermia, no subjective hypothermia Eyes: no blurry vision, no xerophthalmia ENT: no sore throat, no nodules palpated in neck, no dysphagia, no odynophagia, no hoarseness Cardiovascular: no CP/no SOB/no palpitations/no leg swelling Respiratory: no cough/no SOB/no wheezing Gastrointestinal: no N/no V/no D/no C/no acid reflux Musculoskeletal: no muscle aches/no joint aches Skin: no rashes, no hair loss Neurological: no tremors/+ numbness/+ tingling/no dizziness  I reviewed pt's medications, allergies, PMH, social hx, family hx, and changes were documented in the history of present illness. Otherwise, unchanged from my initial  visit note.  Past Medical History:  Diagnosis Date  . Coronary artery disease    post CABG x3 in 2007  . Diabetes mellitus   . Hyperlipidemia   . Hypertension   . Obesity    Past Surgical History:  Procedure Laterality Date  . CARDIAC CATHETERIZATION  EF= 45-50%   EF= 45-50% -- Three-vessel coronary artery disease with high-grade lesions in the proximal left anterior descending artery and multiple lesions in the right coronary -- Low normal ejection fraction -- Stable exertional angina -- [name], M.D. [facility]  . CORONARY ARTERY BYPASS GRAFT  02/19/2006     x3 (left internal mammary artery to LAD, left radial artery to posterior descending, saphenous vein graft to first diagonal), endoscopic vein harvest right leg -- SURGEON:  [name]. [name], M.D.  . HERNIA REPAIR     at 64 y/o  . tripple bypass     Social History   Socioeconomic History  . Marital status: Married    Spouse name: Not on file  . Number of children: 3  Social Needs  Occupational History  . unemployed  Tobacco Use  . Smoking status: Never Smoker  . Smokeless tobacco: Never Used  Substance and Sexual Activity  . Alcohol use: Yes    Alcohol/week:     Types: 1-2 per mo  . Drug use: No  .    Other Topics   .   Social History Narrative  .    Current Outpatient Medications on File Prior to Visit  Medication Sig Dispense Refill  . albuterol (PROVENTIL HFA;VENTOLIN HFA) 108 (90 Base) MCG/ACT inhaler Inhale 1-2 puffs into the lungs every 4 (four) hours as needed for wheezing or shortness of breath. 1 Inhaler 0  . aspirin EC 81 MG tablet Take 1 tablet (81 mg total) by mouth daily. 90 tablet 3  . atorvastatin (LIPITOR) 40 MG tablet Take 1 tablet by mouth once daily 90 tablet 0  . cetirizine (ZYRTEC) 10 MG tablet Take 1 tablet (10 mg total) by mouth daily. 30 tablet 11  . Continuous Blood Gluc Sensor (FREESTYLE LIBRE 14 DAY SENSOR) MISC USE SENSOR EVERY 14 DAYS. CHANGE SENSOR EVERY 2 WEEKS AS DIRECTED 2  each 11  . Dulaglutide (TRULICITY) 3 EU/2.3NT SOPN Inject 3 mg into the skin once a week. 12 pen 3  . FARXIGA 10 MG TABS tablet Take 1 tablet by mouth once daily 90 tablet 0  . glipiZIDE (GLUCOTROL) 5 MG tablet Take 1 tablet (5 mg total) by mouth daily. 90 tablet 3  . losartan (COZAAR) 50 MG tablet Take 1 tablet (50 mg total) by mouth daily. 90 tablet 3  . metFORMIN (GLUCOPHAGE) 1000 MG tablet TAKE 1 TABLET BY MOUTH TWICE DAILY WITH MEALS 180 tablet 0  . Omega-3 Fatty Acids (FISH  OIL) 1000 MG CAPS Take 3 capsules (3,000 mg total) by mouth 2 (two) times daily. 180 capsule 11  . OVER THE COUNTER MEDICATION 665 mg.    . POTASSIUM GLUCONATE PO Take 595 mg by mouth.    . triamcinolone (NASACORT) 55 MCG/ACT AERO nasal inhaler Place 2 sprays into the nose daily. (Patient not taking: Reported on 08/19/2019) 1 Inhaler 12   No current facility-administered medications on file prior to visit.   No Known Allergies Family History  Problem Relation Age of Onset  . Heart disease Mother   . Cancer Father     PE: BP (!) 144/78   Pulse 67   Ht 5\' 10"  (1.778 m)   Wt 222 lb 9.6 oz (101 kg)   SpO2 98%   BMI 31.94 kg/m  Wt Readings from Last 3 Encounters:  05/12/20 222 lb 9.6 oz (101 kg)  12/02/19 225 lb (102.1 kg)  08/19/19 223 lb (101.2 kg)   Constitutional: overweight, in NAD Eyes: PERRLA, EOMI, no exophthalmos ENT: moist mucous membranes, no thyromegaly, no cervical lymphadenopathy Cardiovascular: RRR, No MRG Respiratory: CTA B Gastrointestinal: abdomen soft, NT, ND, BS+ Musculoskeletal: no deformities, strength intact in all 4 Skin: moist, warm, no rashes Neurological: no tremor with outstretched hands, DTR normal in all 4  ASSESSMENT: 1. DM2, non-insulin-dependent, uncontrolled, without complications - CAD - s/p CABG in 2007 - CKD - PN - DR  2. HL  3. Obesity class I  PLAN:  1. Patient with longstanding, uncontrolled, type 2 diabetes, on oral antidiabetic regimen with Metformin  and SGLT2 inhibitor, and also GLP-1 receptor agonist, dose increased at last visit. At that time, sugars were mostly well controlled, with hyperglycemic spikes after breakfast and occasionally after dinner. We discussed about holding glipizide as we increased Trulicity. At that time, he was only using glipizide when he ate more with his meals. HbA1c at last visit was 6.9%, stable. CGM interpretation: -At today's review, we reviewed together his CGM tracings for the last 2 weeks.  It appears that 88% of his blood sugars are in target range, a decrease from last visit, with 91% were in range.  Also, he has more high blood sugars and it appears that these are more frequently occurring in the morning, after breakfast.  The increase in blood sugars is more significant compared to last visit.  Upon questioning, he is having cereals were breakfast and we discussed that this is a breakfast that is not very well-tolerated by the body.  I strongly suggested that he switches this to a breakfast with less carbs.  Given examples.  His sugars after dinner and throughout the night are actually improved from last visit.  No significant low blood sugars, but he has some significant peaks after breakfast up to 270s. -Aside from changing his breakfast, I do not feel we need to change his medication regimen. - I suggested to:  Patient Instructions  Please continue: - Metformin 1000 mg 2x a day, with meals - Farxiga 10 mg before b'fast - Trulicity 3 mg weekly  Please return in 4 months.  - we checked his HbA1c: 6.9% (stable) - advised to check sugars at different times of the day - 4x a day, rotating check times - advised for yearly eye exams >> he is UTD - return to clinic in 4 months  2.  Hyperlipidemia -Reviewed latest lipid panel from last year: LDL at goal, triglycerides high, HDL low: Lab Results  Component Value Date   CHOL 99 (  L) 06/10/2019   HDL 31 (L) 06/10/2019   LDLCALC 35 06/10/2019   LDLDIRECT 51  06/21/2017   TRIG 209 (H) 06/10/2019   CHOLHDL 3.2 06/10/2019  -Continues Lipitor 40, Vascepa, omega-3 fatty acids without side effects -He has labs coming up by PCP  3.  Obesity class I -continue SGLT 2 inhibitor and GLP-1 receptor agonist which should also help with weight loss - stable weight at last OV, lost 3 pounds since last visit  Philemon Kingdom, MD PhD [facility] Endocrinology

## 2020-05-12 NOTE — Patient Instructions (Addendum)
Please continue: - Metformin 1000 mg 2x a day, with meals - Farxiga 10 mg before b'fast - Trulicity 3 mg weekly  Try to change b'fast.  Please return in 4 months.

## 2020-05-21 ENCOUNTER — Ambulatory Visit: Payer: BC Managed Care – PPO | Admitting: Internal Medicine

## 2020-06-21 ENCOUNTER — Other Ambulatory Visit: Payer: Self-pay | Admitting: Internal Medicine

## 2020-06-21 DIAGNOSIS — E1165 Type 2 diabetes mellitus with hyperglycemia: Secondary | ICD-10-CM

## 2020-06-21 DIAGNOSIS — IMO0002 Reserved for concepts with insufficient information to code with codable children: Secondary | ICD-10-CM

## 2020-06-24 ENCOUNTER — Other Ambulatory Visit: Payer: Self-pay

## 2020-06-24 ENCOUNTER — Encounter: Payer: Self-pay | Admitting: Family Medicine

## 2020-06-24 ENCOUNTER — Ambulatory Visit (INDEPENDENT_AMBULATORY_CARE_PROVIDER_SITE_OTHER): Payer: BC Managed Care – PPO | Admitting: Family Medicine

## 2020-06-24 VITALS — BP 149/83 | HR 86 | Temp 97.6°F | Resp 15 | Ht 70.0 in | Wt 221.6 lb

## 2020-06-24 DIAGNOSIS — E785 Hyperlipidemia, unspecified: Secondary | ICD-10-CM

## 2020-06-24 DIAGNOSIS — Z0001 Encounter for general adult medical examination with abnormal findings: Secondary | ICD-10-CM

## 2020-06-24 DIAGNOSIS — I10 Essential (primary) hypertension: Secondary | ICD-10-CM

## 2020-06-24 DIAGNOSIS — E1165 Type 2 diabetes mellitus with hyperglycemia: Secondary | ICD-10-CM

## 2020-06-24 DIAGNOSIS — Z8679 Personal history of other diseases of the circulatory system: Secondary | ICD-10-CM

## 2020-06-24 DIAGNOSIS — M549 Dorsalgia, unspecified: Secondary | ICD-10-CM

## 2020-06-24 DIAGNOSIS — Z0000 Encounter for general adult medical examination without abnormal findings: Secondary | ICD-10-CM

## 2020-06-24 DIAGNOSIS — N529 Male erectile dysfunction, unspecified: Secondary | ICD-10-CM

## 2020-06-24 MED ORDER — LOSARTAN POTASSIUM 50 MG PO TABS: 50.0000 mg | ORAL_TABLET | Freq: Every day | ORAL | 3 refills | Status: DC

## 2020-06-24 NOTE — Patient Instructions (Addendum)
  Referral is being made to a urologist per our discussion.  I think you should discuss your back issues with your back after.  You may need physical therapy, we will wait and see what they decide.  Continue to follow your diabetes with your endocrinologist.  Your losartan is being renewed  Continue to seek to be physically, emotionally, relationally, and spiritually healthy.  Get more regular exercise  Return yearly or as needed  We will let you know the results of your labs.   If you have lab work done today you will be contacted with your lab results within the next 2 weeks.  If you have not heard from [place] then please contact us. The fastest way to get your results is to register for My Chart.   IF you received an x-ray today, you will receive an invoice from [facility] Radiology. Please contact [facility] Radiology at [phone] with questions or concerns regarding your invoice.   IF you received labwork today, you will receive an invoice from [place]. Please contact [facility] at [phone] with questions or concerns regarding your invoice.   Our billing staff will not be able to assist you with questions regarding bills from these companies.  You will be contacted with the lab results as soon as they are available. The fastest way to get your results is to activate your My Chart account. Instructions are located on the last page of this paperwork. If you have not heard from [place] regarding the results in 2 weeks, please contact this office.

## 2020-06-24 NOTE — Progress Notes (Signed)
Patient ID: [name] Love, male    DOB: 1955-08-31  Age: 64 y.o. MRN: [id number]  Chief Complaint  Patient presents with  . Annual Exam    pt is here for physical doing well does have some mid back pain he is seeing ortho for but would like assessed to enssure he is seeing proper specialty     Subjective:   64 year old man well-known to me from previous years when I used to care for him.  He is here for an annual physical examination.  His regular doctor now is Dr. Mitchel Honour but he was worked in with me.  He has been generally healthy and is here for his annual physical.  Past medical history: Hernia when he is 11 Shoulder surgery Trouble bypass in 07 Medical illnesses: Coronary artery disease Diabetes Regular doctors: Cardiologist, endocrinologist, orthopedist No known allergies Medications: See list.  The only thing we prescribed for him from here is is losartan.  Social history: Retired.  Plays some golf and bowls 1 day a week each.  He cooks and shops and does things around the house.  He does not smoke.  Rarely drinks.  Does not use any drugs.  Family history: Unremarkable  Review of systems: Constitutional: Unremarkable HEENT: Unremarkable.  Used to have more migraines now rarely has them. Cardiovascular: Unremarkable Respiratory: Unremarkable GI: Lifelong history of intermittent diarrhea GU: ED.  Nocturia from 1-4 times.  Has not seen a urologist. Musculature: Chronic low back pain and also now has mid back pains more into the right scapular area.  Usually when he gets up and is stiff.  Does not associate with meals. Dermatologic: Intermittent rash on his foot that he uses some cream that a dermatologist had given him Neurologic: Unremarkable Psychiatric: Unremarkable Endocrinologic: Sees his dermatologist      Current allergies, medications, problem list, past/family and social histories reviewed.  Objective:  BP (!) 149/83   Pulse 86   Temp 97.6 F  (36.4 C) (Temporal)   Resp 15   Ht 5' 10" (1.778 m)   Wt 221 lb 9.6 oz (100.5 kg)   SpO2 96%   BMI 31.80 kg/m   Healthy appearing male in no acute distress.  TMs normal.  Eyes PERRL.  He has had bilateral cataract surgery but only intermittently wears his glasses.  His throat is clear.  Teeth good.  Neck supple without nodes or thyromegaly.  No carotid bruits.  Chest clear to auscultation.  Heart rate without murmurs, gallops, or arrhythmias.  No axillary or inguinal nodes.  Abdomen soft without mass or tenderness.  Normal male external genitalia with testes descended.  No hernias.  Extremities unremarkable.  Skin unremarkable.  Assessment & Plan:   Assessment: 1. Wellness examination   2. Type 2 diabetes mellitus with hyperglycemia, without long-term current use of insulin (HCC)   3. Dyslipidemia   4. Essential hypertension   5. History of coronary artery disease   6. Mid back pain   7. Erectile dysfunction, unspecified erectile dysfunction type       Plan: See instructions.  He has had 3 Covid vaccinations.  Orders Placed This Encounter  Procedures  . Varicella-zoster vaccine IM  . Lipid panel  . PSA  . CMP14+EGFR  . TSH  . Ambulatory referral to Urology    Referral Priority:   Routine    Referral Type:   Consultation    Referral Reason:   Specialty Services Required    Requested Specialty:   Urology  Number of Visits Requested:   1    Meds ordered this encounter  Medications  . losartan (COZAAR) 50 MG tablet    Sig: Take 1 tablet (50 mg total) by mouth daily.    Dispense:  90 tablet    Refill:  3         Patient Instructions    Referral is being made to a urologist per our discussion.  I think you should discuss your back issues with your back after.  You may need physical therapy, we will wait and see what they decide.  Continue to follow your diabetes with your endocrinologist.  Your losartan is being renewed  Continue to seek to be  physically, emotionally, relationally, and spiritually healthy.  Get more regular exercise  Return yearly or as needed  We will let you know the results of your labs.   If you have lab work done today you will be contacted with your lab results within the next 2 weeks.  If you have not heard from us then please contact us. The fastest way to get your results is to register for My Chart.   IF you received an x-ray today, you will receive an invoice from [facility]. Please contact [facility] at [phone] with questions or concerns regarding your invoice.   IF you received labwork today, you will receive an invoice from [facility]. Please contact [facility] at [phone] with questions or concerns regarding your invoice.   Our billing staff will not be able to assist you with questions regarding bills from these companies.  You will be contacted with the lab results as soon as they are available. The fastest way to get your results is to activate your My Chart account. Instructions are located on the last page of this paperwork. If you have not heard from us regarding the results in 2 weeks, please contact this office.        Return in about 1 year (around 06/24/2021), or if symptoms worsen or fail to improve.   David Hopper, MD 06/24/2020 

## 2020-06-25 LAB — CMP14+EGFR
ALT: 19 IU/L (ref 0–44)
AST: 18 IU/L (ref 0–40)
Albumin/Globulin Ratio: 2 (ref 1.2–2.2)
Albumin: 4.7 g/dL (ref 3.8–4.8)
Alkaline Phosphatase: 54 IU/L (ref 44–121)
BUN/Creatinine Ratio: 15 (ref 10–24)
BUN: 16 mg/dL (ref 8–27)
Bilirubin Total: 0.5 mg/dL (ref 0.0–1.2)
CO2: 19 mmol/L — ABNORMAL LOW (ref 20–29)
Calcium: 9.2 mg/dL (ref 8.6–10.2)
Chloride: 101 mmol/L (ref 96–106)
Creatinine, Ser: 1.05 mg/dL (ref 0.76–1.27)
GFR calc Af Amer: 87 mL/min/{1.73_m2} (ref >59)
GFR calc non Af Amer: 75 mL/min/{1.73_m2} (ref >59)
Globulin, Total: 2.4 g/dL (ref 1.5–4.5)
Glucose: 139 mg/dL — ABNORMAL HIGH (ref 65–99)
Potassium: 4.3 mmol/L (ref 3.5–5.2)
Sodium: 138 mmol/L (ref 134–144)
Total Protein: 7.1 g/dL (ref 6.0–8.5)

## 2020-06-25 LAB — LIPID PANEL
Chol/HDL Ratio: 3.1 ratio (ref 0.0–5.0)
Cholesterol, Total: 91 mg/dL — ABNORMAL LOW (ref 100–199)
HDL: 29 mg/dL — ABNORMAL LOW (ref >39)
LDL Chol Calc (NIH): 27 mg/dL (ref 0–99)
Triglycerides: 229 mg/dL — ABNORMAL HIGH (ref 0–149)
VLDL Cholesterol Cal: 35 mg/dL (ref 5–40)

## 2020-06-25 LAB — TSH: TSH: 5.17 u[IU]/mL — ABNORMAL HIGH (ref 0.450–4.500)

## 2020-06-25 LAB — PSA: Prostate Specific Ag, Serum: 1 ng/mL (ref 0.0–4.0)

## 2020-06-28 ENCOUNTER — Other Ambulatory Visit: Payer: Self-pay | Admitting: Emergency Medicine

## 2020-06-28 DIAGNOSIS — E785 Hyperlipidemia, unspecified: Secondary | ICD-10-CM

## 2020-06-28 DIAGNOSIS — E1165 Type 2 diabetes mellitus with hyperglycemia: Secondary | ICD-10-CM

## 2020-06-29 DIAGNOSIS — M546 Pain in thoracic spine: Secondary | ICD-10-CM | POA: Insufficient documentation

## 2020-06-29 DIAGNOSIS — M5134 Other intervertebral disc degeneration, thoracic region: Secondary | ICD-10-CM | POA: Insufficient documentation

## 2020-07-13 ENCOUNTER — Other Ambulatory Visit: Payer: Self-pay | Admitting: Internal Medicine

## 2020-08-18 ENCOUNTER — Other Ambulatory Visit: Payer: Self-pay | Admitting: Internal Medicine

## 2020-08-21 NOTE — Progress Notes (Signed)
Cardiology Office Note   Date:  08/23/2020   ID:  Love, [name] 03-Aug-1955, MRN [id number]  PCP:  [name], MD    No chief complaint on file.  CAD  Wt Readings from Last 3 Encounters:  08/23/20 226 lb (102.5 kg)  06/24/20 221 lb 9.6 oz (100.5 kg)  05/12/20 222 lb 9.6 oz (101 kg)       History of Present Illness: [name] Love is a 65 y.o. male   who had CABG in 2007. He had a nuclear stress test in 2012 that was ok.  Echo in 2014: Left ventricle: The cavity size was normal. There was mild focal basal hypertrophy of the septum. Systolic function was normal. The estimated ejection fraction was in the range of 55% to 60%. Wall motion was normal; there were no regional wall motion abnormalities. - Left atrium: The atrium was mildly dilated.  He does the elliptical on a fairly regular basis. No cardiac sx. He has to rehab his shoulders.   Got platelet rich plasma plasma injections for his hamstrings. Hamstrings improved.  Limited by back pain.  Walking limited.   Denies : Chest pain. Dizziness. Leg edema. Nitroglycerin use. Orthopnea. Palpitations. Paroxysmal nocturnal dyspnea. Shortness of breath. Syncope.   Bowls once a week.    No problems with the COVID vaccines.       Past Medical History:  Diagnosis Date  . Coronary artery disease    post CABG x3 in 2007  . Diabetes mellitus   . Hyperlipidemia   . Hypertension   . Obesity     Past Surgical History:  Procedure Laterality Date  . CARDIAC CATHETERIZATION  EF= 45-50%   EF= 45-50% -- Three-vessel coronary artery disease with high-grade lesions in the proximal left anterior descending artery and multiple lesions in the right coronary -- Low normal ejection fraction -- Stable exertional angina -- [name], M.D. [facility]  . CORONARY ARTERY BYPASS GRAFT  02/19/2006     x3 (left internal mammary artery to LAD, left radial artery to posterior descending,  saphenous vein graft to first diagonal), endoscopic vein harvest right leg -- SURGEON:  [name]. [name], M.D.  . HERNIA REPAIR     at 65 y/o  . tripple bypass       Current Outpatient Medications  Medication Sig Dispense Refill  . albuterol (PROVENTIL HFA;VENTOLIN HFA) 108 (90 Base) MCG/ACT inhaler Inhale 1-2 puffs into the lungs every 4 (four) hours as needed for wheezing or shortness of breath. 1 Inhaler 0  . aspirin EC 81 MG tablet Take 1 tablet (81 mg total) by mouth daily. 90 tablet 3  . atorvastatin (LIPITOR) 40 MG tablet Take 1 tablet by mouth once daily 90 tablet 0  . Continuous Blood Gluc Sensor (FREESTYLE LIBRE 14 DAY SENSOR) MISC USE SENSOR EVERY 14 DAYS. CHANGE SENSOR EVERY 2 WEEKS AS DIRECTED 2 each 11  . Dulaglutide (TRULICITY) 3 WU/9.8JX SOPN INJECT 3MG  SUBCUTANEOUSLY  ONCE A WEEK 12 mL 3  . FARXIGA 10 MG TABS tablet Take 1 tablet by mouth once daily 90 tablet 0  . glipiZIDE (GLUCOTROL) 5 MG tablet Take 1 tablet (5 mg total) by mouth daily. 90 tablet 3  . losartan (COZAAR) 50 MG tablet Take 1 tablet (50 mg total) by mouth daily. 90 tablet 3  . metFORMIN (GLUCOPHAGE) 1000 MG tablet TAKE 1 TABLET BY MOUTH TWICE DAILY WITH MEALS 180 tablet 0  . Omega-3 Fatty Acids (FISH OIL) 1000 MG CAPS Take  3 capsules (3,000 mg total) by mouth 2 (two) times daily. 180 capsule 11  . OVER THE COUNTER MEDICATION 665 mg.    . POTASSIUM GLUCONATE PO Take 595 mg by mouth.    . triamcinolone (NASACORT) 55 MCG/ACT AERO nasal inhaler Place 2 sprays into the nose daily. 1 Inhaler 12   No current facility-administered medications for this visit.    Allergies:   Patient has no known allergies.    Social History:  The patient  reports that he has never smoked. He has never used smokeless tobacco. He reports current alcohol use of about 1.0 standard drink of alcohol per week. He reports that he does not use drugs.   Family History:  The patient's family history includes Cancer in his father;  Heart disease in his mother.    ROS:  Please see the history of present illness.   Otherwise, review of systems are positive for back pain.   All other systems are reviewed and negative.    PHYSICAL EXAM: VS:  BP 134/60   Pulse 87   Ht 5\' 10"  (1.778 m)   Wt 226 lb (102.5 kg)   SpO2 95%   BMI 32.43 kg/m  , BMI Body mass index is 32.43 kg/m. GEN: Well nourished, well developed, in no acute distress  HEENT: normal  Neck: no JVD, carotid bruits, or masses Cardiac: RRR; no murmurs, rubs, or gallops,no edema  Respiratory:  clear to auscultation bilaterally, normal work of breathing GI: soft, nontender, nondistended, + BS MS: no deformity or atrophy  Skin: warm and dry, no rash Neuro:  Strength and sensation are intact Psych: euthymic mood, full affect   EKG:   The ekg ordered today demonstrates NSR, diffuse inferolateral TWI; no significant change from 2021.   Recent Labs: 06/24/2020: ALT 19; BUN 16; Creatinine, Ser 1.05; Potassium 4.3; Sodium 138; TSH 5.170   Lipid Panel    Component Value Date/Time   CHOL 91 (L) 06/24/2020 0930   TRIG 229 (H) 06/24/2020 0930   HDL 29 (L) 06/24/2020 0930   CHOLHDL 3.1 06/24/2020 0930   CHOLHDL 5.4 (H) 10/08/2015 1051   VLDL 76 (H) 10/08/2015 1051   LDLCALC 27 06/24/2020 0930   LDLDIRECT 51 06/21/2017 1036     Other studies Reviewed: Additional studies/ records that were reviewed today with results demonstrating: labs reviewed.     ASSESSMENT AND PLAN:  1. CAD: Continue aggressive secondary prevention.  No angina.  Clopidogrel monotherapy given multiple stents.  2. Hyperlipidemia: LDL 27.  The current medical regimen is effective;  continue present plan and medications. TG 229. Vascepa did not help TG, so he went back to OTC fish oil.  3. Abnormal ECG: Chronic inferolateral TWI.  No change.  4. HTN: The current medical regimen is effective;  continue present plan and medications. 5. DM: Whole food, plant based diet.  High fiber diet.     Current medicines are reviewed at length with the patient today.  The patient concerns regarding his medicines were addressed.  The following changes have been made:  No change  Labs/ tests ordered today include:  No orders of the defined types were placed in this encounter.   Recommend 150 minutes/week of aerobic exercise Low fat, low carb, high fiber diet recommended  Disposition:   FU in 1 year   Signed, Larae Grooms, MD  08/23/2020 11:18 AM    [facility] [address], [place], [place]  [address] Phone: [phone]; Fax: [phone]   

## 2020-08-23 ENCOUNTER — Ambulatory Visit: Payer: BC Managed Care – PPO | Admitting: Interventional Cardiology

## 2020-08-23 ENCOUNTER — Other Ambulatory Visit: Payer: Self-pay

## 2020-08-23 ENCOUNTER — Encounter: Payer: Self-pay | Admitting: Interventional Cardiology

## 2020-08-23 VITALS — BP 134/60 | HR 87 | Ht 70.0 in | Wt 226.0 lb

## 2020-08-23 DIAGNOSIS — I2510 Atherosclerotic heart disease of native coronary artery without angina pectoris: Secondary | ICD-10-CM | POA: Diagnosis not present

## 2020-08-23 DIAGNOSIS — E1165 Type 2 diabetes mellitus with hyperglycemia: Secondary | ICD-10-CM

## 2020-08-23 DIAGNOSIS — I10 Essential (primary) hypertension: Secondary | ICD-10-CM | POA: Diagnosis not present

## 2020-08-23 DIAGNOSIS — E785 Hyperlipidemia, unspecified: Secondary | ICD-10-CM

## 2020-08-23 DIAGNOSIS — E782 Mixed hyperlipidemia: Secondary | ICD-10-CM

## 2020-08-23 DIAGNOSIS — E1159 Type 2 diabetes mellitus with other circulatory complications: Secondary | ICD-10-CM

## 2020-08-23 MED ORDER — ATORVASTATIN CALCIUM 40 MG PO TABS: 40.0000 mg | ORAL_TABLET | Freq: Every day | ORAL | 3 refills | Status: DC

## 2020-08-23 NOTE — Patient Instructions (Signed)
Medication Instructions:  Your physician recommends that you continue on your current medications as directed. Please refer to the Current Medication list given to you today.  *If you need a refill on your cardiac medications before your next appointment, please call your pharmacy*   Lab Work: None  If you have labs (blood work) drawn today and your tests are completely normal, you will receive your results only by: . MyChart Message (if you have MyChart) OR . A paper copy in the mail If you have any lab test that is abnormal or we need to change your treatment, we will call you to review the results.   Testing/Procedures: None  Follow-Up: At [facility], you and your health needs are our priority.  As part of our continuing mission to provide you with exceptional heart care, we have created designated Provider Care Teams.  These Care Teams include your primary Cardiologist (physician) and Advanced Practice Providers (APPs -  Physician Assistants and Nurse Practitioners) who all work together to provide you with the care you need, when you need it.  We recommend signing up for the patient portal called "MyChart".  Sign up information is provided on this After Visit Summary.  MyChart is used to connect with patients for Virtual Visits (Telemedicine).  Patients are able to view lab/test results, encounter notes, upcoming appointments, etc.  Non-urgent messages can be sent to your provider as well.   To learn more about what you can do with MyChart, go to [email or web address].    Your next appointment:   12 month(s)  The format for your next appointment:   In Person  Provider:   You may see Jayadeep Varanasi, MD or one of the following Advanced Practice Providers on your designated Care Team:    Dayna Dunn, PA-C  Michele Lenze, PA-C    Other Instructions  High-Fiber Eating Plan Fiber, also called dietary fiber, is a type of carbohydrate. It is found foods such as fruits,  vegetables, whole grains, and beans. A high-fiber diet can have many health benefits. Your health care provider may recommend a high-fiber diet to help:  Prevent constipation. Fiber can make your bowel movements more regular.  Lower your cholesterol.  Relieve the following conditions: ? Inflammation of veins in the anus (hemorrhoids). ? Inflammation of specific areas of the digestive tract (uncomplicated diverticulosis). ? A problem of the large intestine, also called the colon, that sometimes causes pain and diarrhea (irritable bowel syndrome, or IBS).  Prevent overeating as part of a weight-loss plan.  Prevent heart disease, type 2 diabetes, and certain cancers. What are tips for following this plan? Reading food labels  Check the nutrition facts label on food products for the amount of dietary fiber. Choose foods that have 5 grams of fiber or more per serving.  The goals for recommended daily fiber intake include: ? Men (age 50 or younger): 34-38 g. ? Men (over age 50): 28-34 g. ? Women (age 50 or younger): 25-28 g. ? Women (over age 50): 22-25 g. Your daily fiber goal is _____________ g.   Shopping  Choose whole fruits and vegetables instead of processed forms, such as apple juice or applesauce.  Choose a wide variety of high-fiber foods such as avocados, lentils, oats, and kidney beans.  Read the nutrition facts label of the foods you choose. Be aware of foods with added fiber. These foods often have high sugar and sodium amounts per serving. Cooking  Use whole-grain flour for baking and cooking.    Cook with brown rice instead of white rice. Meal planning  Start the day with a breakfast that is high in fiber, such as a cereal that contains 5 g of fiber or more per serving.  Eat breads and cereals that are made with whole-grain flour instead of refined flour or white flour.  Eat brown rice, bulgur wheat, or millet instead of white rice.  Use beans in place of meat in  soups, salads, and pasta dishes.  Be sure that half of the grains you eat each day are whole grains. General information  You can get the recommended daily intake of dietary fiber by: ? Eating a variety of fruits, vegetables, grains, nuts, and beans. ? Taking a fiber supplement if you are not able to take in enough fiber in your diet. It is better to get fiber through food than from a supplement.  Gradually increase how much fiber you consume. If you increase your intake of dietary fiber too quickly, you may have bloating, cramping, or gas.  Drink plenty of water to help you digest fiber.  Choose high-fiber snacks, such as berries, raw vegetables, nuts, and popcorn. What foods should I eat? Fruits Berries. Pears. Apples. Oranges. Avocado. Prunes and raisins. Dried figs. Vegetables Sweet potatoes. Spinach. Kale. Artichokes. Cabbage. Broccoli. Cauliflower. Green peas. Carrots. Squash. Grains Whole-grain breads. Multigrain cereal. Oats and oatmeal. Brown rice. Barley. Bulgur wheat. Millet. Quinoa. Bran muffins. Popcorn. Rye wafer crackers. Meats and other proteins Navy beans, kidney beans, and pinto beans. Soybeans. Split peas. Lentils. Nuts and seeds. Dairy Fiber-fortified yogurt. Beverages Fiber-fortified soy milk. Fiber-fortified orange juice. Other foods Fiber bars. The items listed above may not be a complete list of recommended foods and beverages. Contact a dietitian for more information. What foods should I avoid? Fruits Fruit juice. Cooked, strained fruit. Vegetables Fried potatoes. Canned vegetables. Well-cooked vegetables. Grains White bread. Pasta made with refined flour. White rice. Meats and other proteins Fatty cuts of meat. Fried chicken or fried fish. Dairy Milk. Yogurt. Cream cheese. Sour cream. Fats and oils Butters. Beverages Soft drinks. Other foods Cakes and pastries. The items listed above may not be a complete list of foods and beverages to avoid.  Talk with your dietitian about what choices are best for you. Summary  Fiber is a type of carbohydrate. It is found in foods such as fruits, vegetables, whole grains, and beans.  A high-fiber diet has many benefits. It can help to prevent constipation, lower blood cholesterol, aid weight loss, and reduce your risk of heart disease, diabetes, and certain cancers.  Increase your intake of fiber gradually. Increasing fiber too quickly may cause cramping, bloating, and gas. Drink plenty of water while you increase the amount of fiber you consume.  The best sources of fiber include whole fruits and vegetables, whole grains, nuts, seeds, and beans. This information is not intended to replace advice given to you by your health care provider. Make sure you discuss any questions you have with your health care provider. Document Revised: 11/13/2019 Document Reviewed: 11/13/2019 Elsevier Patient Education  2021 [employer]vier Inc.   

## 2020-09-13 ENCOUNTER — Other Ambulatory Visit: Payer: Self-pay

## 2020-09-13 DIAGNOSIS — E1165 Type 2 diabetes mellitus with hyperglycemia: Secondary | ICD-10-CM

## 2020-09-13 DIAGNOSIS — E1159 Type 2 diabetes mellitus with other circulatory complications: Secondary | ICD-10-CM

## 2020-09-13 MED ORDER — FREESTYLE LIBRE 14 DAY SENSOR MISC: 2 refills | Status: DC

## 2020-09-13 NOTE — Telephone Encounter (Signed)
Inbound fax from [facility] requesting a refill for [employer]. Rx sent to preferred pharmacy.

## 2020-09-14 ENCOUNTER — Encounter: Payer: Self-pay | Admitting: Internal Medicine

## 2020-09-14 ENCOUNTER — Ambulatory Visit: Payer: BC Managed Care – PPO | Admitting: Internal Medicine

## 2020-09-14 ENCOUNTER — Other Ambulatory Visit: Payer: Self-pay

## 2020-09-14 VITALS — BP 128/82 | HR 73 | Ht 70.0 in | Wt 222.6 lb

## 2020-09-14 DIAGNOSIS — E1159 Type 2 diabetes mellitus with other circulatory complications: Secondary | ICD-10-CM

## 2020-09-14 DIAGNOSIS — E785 Hyperlipidemia, unspecified: Secondary | ICD-10-CM

## 2020-09-14 DIAGNOSIS — IMO0002 Reserved for concepts with insufficient information to code with codable children: Secondary | ICD-10-CM

## 2020-09-14 DIAGNOSIS — E1165 Type 2 diabetes mellitus with hyperglycemia: Secondary | ICD-10-CM

## 2020-09-14 DIAGNOSIS — E118 Type 2 diabetes mellitus with unspecified complications: Secondary | ICD-10-CM | POA: Diagnosis not present

## 2020-09-14 DIAGNOSIS — E669 Obesity, unspecified: Secondary | ICD-10-CM

## 2020-09-14 DIAGNOSIS — Z6832 Body mass index (BMI) 32.0-32.9, adult: Secondary | ICD-10-CM

## 2020-09-14 LAB — POCT GLYCOSYLATED HEMOGLOBIN (HGB A1C): Hemoglobin A1C: 6.4 % — AB (ref 4.0–5.6)

## 2020-09-14 MED ORDER — METFORMIN HCL 1000 MG PO TABS: 1000.0000 mg | ORAL_TABLET | Freq: Two times a day (BID) | ORAL | 3 refills | Status: DC

## 2020-09-14 MED ORDER — DAPAGLIFLOZIN PROPANEDIOL 10 MG PO TABS
10.0000 mg | ORAL_TABLET | Freq: Every day | ORAL | 3 refills | Status: DC
Start: 2020-09-14 — End: 2021-09-23

## 2020-09-14 NOTE — Addendum Note (Signed)
Addended by: Lauralyn Primes on: 09/14/2020 02:58 PM   Modules accepted: Orders

## 2020-09-14 NOTE — Progress Notes (Signed)
Patient ID: [name] Love, male   DOB: [birth year], 65 y.o.   MRN: [id number]   This visit occurred during the SARS-CoV-2 public health emergency.  Safety protocols were in place, including screening questions prior to the visit, additional usage of staff PPE, and extensive cleaning of exam room while observing appropriate contact time as indicated for disinfecting solutions.   HPI: [name] Love is a 65 y.o.-year-old male, initially referred by his PCP, Dr. Mitchel Honour, returning for follow-up for DM2, dx in ~2008, non-insulin-dependent, uncontrolled, with long term complications (CAD - s/p CABG in 2007, CKD, PN, DR).  Last visit 4 months ago.  Reviewing HbA1c levels: Lab Results  Component Value Date   HGBA1C 6.9 (A) 05/12/2020   HGBA1C 6.9 (A) 12/02/2019   HGBA1C 6.9 (A) 08/04/2019   HGBA1C 7.3 (H) 06/10/2019   HGBA1C 6.8 (A) 04/02/2019   HGBA1C 7.0 (A) 11/05/2018   HGBA1C 6.5 (A) 07/10/2018   HGBA1C 6.8 (A) 05/27/2018   HGBA1C 6.9 (A) 03/21/2018   HGBA1C 6.4 11/06/2017   HGBA1C 8.2 06/21/2017   HGBA1C 9.5 03/21/2017   HGBA1C 8.8 07/27/2016   HGBA1C 9.3 10/08/2015   HGBA1C 8.6 09/24/2014  He got a steroid inj in shoulder >> 05/10/2017.  Pt is on a regimen of: - Metformin 1000 mg 2x a day, with meals -  >> stopped 05/21 - Farxiga 10 mg before b'fast - Trulicity 1.5 mg weekly-started 05/2017 >> 3 mg weekly-tolerated well We stopped glipizide in 11/2019.  He checks his sugars more than 4 times a day - With his freestyle libre CGM.  CGM parameters:    Previously:   Previously:   Lowest sugar was 70s >> 66 (Libre); it is unclear at which level he has hypoglycemia awareness. Highest sugar was 365 (erroneous?) >> 200s >> 270.  Pt's meals are: - Breakfast: bagel, OJ >> cereals >> occas. Cereals, or eggs  >> stopped OJ - Lunch: soup or sandwich or leftovers - Dinner: meat, veggies - Snacks: not daily  -+ Mild CKD, last BUN/creatinine:  Lab Results   Component Value Date   BUN 16 06/24/2020   BUN 14 06/10/2019   CREATININE 1.05 06/24/2020   CREATININE 1.18 06/10/2019  On losartan 50.  -+ HL; last set of lipids: Lab Results  Component Value Date   CHOL 91 (L) 06/24/2020   HDL 29 (L) 06/24/2020   LDLCALC 27 06/24/2020   LDLDIRECT 51 06/21/2017   TRIG 229 (H) 06/24/2020   CHOLHDL 3.1 06/24/2020  On Lipitor, omega-3 fatty acids 3000 mg 2x a day.  - last eye exam was in 12/2019: No DR reportedly.  However, in 12/2018, it was mentioned that he did have retinopathy.  Dr. Alois Cliche.  -+ Numbness and tingling in his feet.  On ASA 81.  ROS: Constitutional: no weight gain/no weight loss, no fatigue, no subjective hyperthermia, no subjective hypothermia Eyes: no blurry vision, no xerophthalmia ENT: no sore throat, no nodules palpated in neck, no dysphagia, no odynophagia, no hoarseness Cardiovascular: no CP/no SOB/no palpitations/no leg swelling Respiratory: no cough/no SOB/no wheezing Gastrointestinal: no N/no V/no D/no C/no acid reflux Musculoskeletal: no muscle aches/no joint aches Skin: no rashes, no hair loss Neurological: no tremors/+ numbness/+ tingling/no dizziness  I reviewed pt's medications, allergies, PMH, social hx, family hx, and changes were documented in the history of present illness. Otherwise, unchanged from my initial visit note.  Past Medical History:  Diagnosis Date  . Coronary artery disease    post CABG x3 in 2007  .  Diabetes mellitus   . Hyperlipidemia   . Hypertension   . Obesity    Past Surgical History:  Procedure Laterality Date  . CARDIAC CATHETERIZATION  EF= 45-50%   EF= 45-50% -- Three-vessel coronary artery disease with high-grade lesions in the proximal left anterior descending artery and multiple lesions in the right coronary -- Low normal ejection fraction -- Stable exertional angina -- [name], M.D. [facility]  . CORONARY ARTERY BYPASS GRAFT  02/19/2006     x3 (left internal mammary  artery to LAD, left radial artery to posterior descending, saphenous vein graft to first diagonal), endoscopic vein harvest right leg -- SURGEON:  [name]. [name], M.D.  . HERNIA REPAIR     at 65 y/o  . tripple bypass     Social History   Socioeconomic History  . Marital status: Married    Spouse name: Not on file  . Number of children: 3  Social Needs  Occupational History  . unemployed  Tobacco Use  . Smoking status: Never Smoker  . Smokeless tobacco: Never Used  Substance and Sexual Activity  . Alcohol use: Yes    Alcohol/week:     Types: 1-2 per mo  . Drug use: No  .    Other Topics   .   Social History Narrative  .    Current Outpatient Medications on File Prior to Visit  Medication Sig Dispense Refill  . albuterol (PROVENTIL HFA;VENTOLIN HFA) 108 (90 Base) MCG/ACT inhaler Inhale 1-2 puffs into the lungs every 4 (four) hours as needed for wheezing or shortness of breath. 1 Inhaler 0  . aspirin EC 81 MG tablet Take 1 tablet (81 mg total) by mouth daily. 90 tablet 3  . atorvastatin (LIPITOR) 40 MG tablet Take 1 tablet (40 mg total) by mouth daily. 90 tablet 3  . Continuous Blood Gluc Sensor (FREESTYLE LIBRE 14 DAY SENSOR) MISC USE SENSOR EVERY 14 DAYS. CHANGE SENSOR EVERY 2 WEEKS AS DIRECTED 6 each 2  . Dulaglutide (TRULICITY) 3 VO/5.3GU SOPN INJECT 3MG  SUBCUTANEOUSLY  ONCE A WEEK 12 mL 3  . FARXIGA 10 MG TABS tablet Take 1 tablet by mouth once daily 90 tablet 0  . glipiZIDE (GLUCOTROL) 5 MG tablet Take 1 tablet (5 mg total) by mouth daily. 90 tablet 3  . losartan (COZAAR) 50 MG tablet Take 1 tablet (50 mg total) by mouth daily. 90 tablet 3  . metFORMIN (GLUCOPHAGE) 1000 MG tablet TAKE 1 TABLET BY MOUTH TWICE DAILY WITH MEALS 180 tablet 0  . Omega-3 Fatty Acids (FISH OIL) 1000 MG CAPS Take 3 capsules (3,000 mg total) by mouth 2 (two) times daily. 180 capsule 11  . OVER THE COUNTER MEDICATION 665 mg.    . POTASSIUM GLUCONATE PO Take 595 mg by mouth.    .  triamcinolone (NASACORT) 55 MCG/ACT AERO nasal inhaler Place 2 sprays into the nose daily. 1 Inhaler 12   No current facility-administered medications on file prior to visit.   No Known Allergies Family History  Problem Relation Age of Onset  . Heart disease Mother   . Cancer Father     PE: BP 128/82   Pulse 73   Ht 5\' 10"  (1.778 m)   Wt 222 lb 9.6 oz (101 kg)   SpO2 98%   BMI 31.94 kg/m  Wt Readings from Last 3 Encounters:  09/14/20 222 lb 9.6 oz (101 kg)  08/23/20 226 lb (102.5 kg)  06/24/20 221 lb 9.6 oz (100.5 kg)   Constitutional:  overweight, in NAD Eyes: PERRLA, EOMI, no exophthalmos ENT: moist mucous membranes, no thyromegaly, no cervical lymphadenopathy Cardiovascular: RRR, No MRG Respiratory: CTA B Gastrointestinal: abdomen soft, NT, ND, BS+ Musculoskeletal: no deformities, strength intact in all 4 Skin: moist, warm, no rashes Neurological: no tremor with outstretched hands, DTR normal in all 4  ASSESSMENT: 1. DM2, non-insulin-dependent, uncontrolled, without complications - CAD - s/p CABG in 2007 - CKD - PN - DR  2. HL  3. Obesity class I  PLAN:  1. Patient with longstanding, uncontrolled type 2 diabetes, on Metformin, SGLT2 inhibitor, and also GLP-1 receptor agonist, with good control.  At last visit, HbA1c was at goal, at 6.9%, stable.  Reviewing his CGM downloads at that time, he had some significant peaks after breakfast, up to 270.  Discussed about changing his breakfast to include less carbs.  His sugars after dinner throughout the night were actually improved at that time. CGM interpretation: -At today's visit, we reviewed her CGM downloads: It appears that 95% of values are in target range (goal >70%), while to % are higher than 180 (goal <25%), and 3% are lower than 70 (goal <4%).  The calculated average blood sugar is 113.  The projected HbA1c for the next 3 months (GMI) is 6.0. -Reviewing the CGM trends, his sugars are excellent except he has a  little bit more variability at night and some of his blood sugars (less than 5%) are lower from 5 AM to 8 AM.  Upon questioning, he is occasionally taking glipizide before a larger meal at night.  I suspect that he is dropping after taking this.  I advised him to stop glipizide.  I will take this off his medication list.  Otherwise, his regimen is adequate and he should continue it. - I suggested to:  Patient Instructions  Please continue: - Metformin 1000 mg 2x a day, with meals - Farxiga 10 mg before b'fast - Trulicity 3 mg weekly  Please return in 4 months.  - we checked his HbA1c: 6.4% (better) - advised to check sugars at different times of the day - 4x a day, rotating check times - advised for yearly eye exams >> he is UTD - return to clinic in 4 months  2.  Hyperlipidemia -Reviewed latest lipid panel from 06/2020: LDL at goal, triglycerides high, HDL low: Lab Results  Component Value Date   CHOL 91 (L) 06/24/2020   HDL 29 (L) 06/24/2020   LDLCALC 27 06/24/2020   LDLDIRECT 51 06/21/2017   TRIG 229 (H) 06/24/2020   CHOLHDL 3.1 06/24/2020  -Continues Lipitor 40, omega-3 fatty acids 3 mg twice a day, without side effects.  Previously on Farxiga but this did not help much and he was quite expensive so he switched back to omega-3 fatty acids but increase the dose from 2 mg to 3 mg twice a day  3.  Obesity class I -continue SGLT 2 inhibitor and GLP-1 receptor agonist which should also help with weight loss -Lost 3 pounds before last visit, weight stable since then  Philemon Kingdom, MD PhD [facility] Endocrinology

## 2020-09-14 NOTE — Patient Instructions (Signed)
Please continue: - Metformin 1000 mg 2x a day, with meals - Farxiga 10 mg before b'fast - Trulicity 3 mg weekly  Please return in 4 months.

## 2021-01-03 ENCOUNTER — Ambulatory Visit: Payer: BC Managed Care – PPO | Admitting: Internal Medicine

## 2021-01-03 ENCOUNTER — Encounter: Payer: Self-pay | Admitting: Internal Medicine

## 2021-01-03 ENCOUNTER — Other Ambulatory Visit: Payer: Self-pay

## 2021-01-03 VITALS — BP 138/78 | HR 77 | Ht 70.0 in | Wt 217.0 lb

## 2021-01-03 DIAGNOSIS — E1159 Type 2 diabetes mellitus with other circulatory complications: Secondary | ICD-10-CM | POA: Diagnosis not present

## 2021-01-03 DIAGNOSIS — Z6832 Body mass index (BMI) 32.0-32.9, adult: Secondary | ICD-10-CM

## 2021-01-03 DIAGNOSIS — E1165 Type 2 diabetes mellitus with hyperglycemia: Secondary | ICD-10-CM | POA: Diagnosis not present

## 2021-01-03 DIAGNOSIS — E785 Hyperlipidemia, unspecified: Secondary | ICD-10-CM

## 2021-01-03 DIAGNOSIS — E669 Obesity, unspecified: Secondary | ICD-10-CM

## 2021-01-03 LAB — POCT GLYCOSYLATED HEMOGLOBIN (HGB A1C): Hemoglobin A1C: 7.3 % — AB (ref 4.0–5.6)

## 2021-01-03 MED ORDER — METFORMIN HCL ER 500 MG PO TB24: 1000.0000 mg | ORAL_TABLET | Freq: Two times a day (BID) | ORAL | 3 refills | Status: DC

## 2021-01-03 NOTE — Progress Notes (Signed)
Patient ID: [name] Love, male   DOB: 03/13/1956, 65 y.o.   MRN: [id number]   This visit occurred during the SARS-CoV-2 public health emergency.  Safety protocols were in place, including screening questions prior to the visit, additional usage of staff PPE, and extensive cleaning of exam room while observing appropriate contact time as indicated for disinfecting solutions.   HPI: [name] Love is a 65 y.o.-year-old male, initially referred by his PCP, Dr. Mitchel Honour, returning for follow-up for DM2, dx in ~2008, non-insulin-dependent, uncontrolled, with long term complications (CAD - s/p CABG in 2007, CKD, PN, DR).  Last visit 4 months ago.  Interim history: No increased urination, blurry vision, nausea, chest pain. He has occasional diarrhea.  Reviewing HbA1c levels: Lab Results  Component Value Date   HGBA1C 6.4 (A) 09/14/2020   HGBA1C 6.9 (A) 05/12/2020   HGBA1C 6.9 (A) 12/02/2019   HGBA1C 6.9 (A) 08/04/2019   HGBA1C 7.3 (H) 06/10/2019   HGBA1C 6.8 (A) 04/02/2019   HGBA1C 7.0 (A) 11/05/2018   HGBA1C 6.5 (A) 07/10/2018   HGBA1C 6.8 (A) 05/27/2018   HGBA1C 6.9 (A) 03/21/2018   HGBA1C 6.4 11/06/2017   HGBA1C 8.2 06/21/2017   HGBA1C 9.5 03/21/2017   HGBA1C 8.8 07/27/2016   HGBA1C 9.3 10/08/2015  He got a steroid inj in shoulder >> 05/10/2017.  Pt is on a regimen of: - Metformin 1000 mg 2x a day, with meals - Farxiga 10 mg before b'fast - Trulicity 1.5 mg weekly-started 05/2017 >> 3 mg weekly-tolerated well - missed 2 weeks lately We stopped glipizide in 11/2019,  He checks his sugars more than 4 times a day - With his freestyle libre CGM.   Previously:   Previously:  Lowest sugar was 70s >> 66 (Libre) >> 80; it is unclear at which level he has hypoglycemia awareness. Highest sugar was 365 (erroneous?) >> 200s >> 270 >> 200s.  Pt's meals are: - Breakfast: bagel, OJ >> cereals >> occas. Cereals, or eggs  >> stopped OJ - Lunch: soup or sandwich or  leftovers - Dinner: meat, veggies - Snacks: not daily  -+ Mild CKD, last BUN/creatinine:  Lab Results  Component Value Date   BUN 16 06/24/2020   BUN 14 06/10/2019   CREATININE 1.05 06/24/2020   CREATININE 1.18 06/10/2019  On losartan 50.  -+ HL; last set of lipids: Lab Results  Component Value Date   CHOL 91 (L) 06/24/2020   HDL 29 (L) 06/24/2020   LDLCALC 27 06/24/2020   LDLDIRECT 51 06/21/2017   TRIG 229 (H) 06/24/2020   CHOLHDL 3.1 06/24/2020  On Lipitor, omega-3 fatty acids 3000 mg 2x a day.  - last eye exam was in 01/2020: No DR reportedly.  However, in 12/2018, it was mentioned that he did have retinopathy.  Dr. Alois Cliche.  -+ Numbness and tingling in his feet.  On ASA 81.  ROS: Constitutional: no weight gain/no weight loss, no fatigue, no subjective hyperthermia, no subjective hypothermia Eyes: no blurry vision, no xerophthalmia ENT: no sore throat, no nodules palpated in neck, no dysphagia, no odynophagia, no hoarseness Cardiovascular: no CP/no SOB/no palpitations/no leg swelling Respiratory: no cough/no SOB/no wheezing Gastrointestinal: no N/no V/+ D/no C/no acid reflux Musculoskeletal: no muscle aches/no joint aches Skin: no rashes, no hair loss Neurological: no tremors/+ numbness/+ tingling/no dizziness  I reviewed pt's medications, allergies, PMH, social hx, family hx, and changes were documented in the history of present illness. Otherwise, unchanged from my initial visit note.  Past Medical History:  Diagnosis  Date   Coronary artery disease    post CABG x3 in 2007   Diabetes mellitus    Hyperlipidemia    Hypertension    Obesity    Past Surgical History:  Procedure Laterality Date   CARDIAC CATHETERIZATION  EF= 45-50%   EF= 45-50% -- Three-vessel coronary artery disease with high-grade lesions in the proximal left anterior descending artery and multiple lesions in the right coronary -- Low normal ejection fraction -- Stable exertional angina --  [name], M.D. [facility]   CORONARY ARTERY BYPASS GRAFT  02/19/2006     x3 (left internal mammary artery to LAD, left radial artery to posterior descending, saphenous vein graft to first diagonal), endoscopic vein harvest right leg -- SURGEON:  [name]. [name], M.D.   HERNIA REPAIR     at 65 y/o   tripple bypass     Social History   Socioeconomic History   Marital status: Married    Spouse name: Not on file   Number of children: 3  Social Needs  Occupational History   unemployed  Tobacco Use   Smoking status: Never Smoker   Smokeless tobacco: Never Used  Substance and Sexual Activity   Alcohol use: Yes    Alcohol/week:     Types: 1-2 per mo   Drug use: No      Other Topics      Social History Narrative      Current Outpatient Medications on File Prior to Visit  Medication Sig Dispense Refill   albuterol (PROVENTIL HFA;VENTOLIN HFA) 108 (90 Base) MCG/ACT inhaler Inhale 1-2 puffs into the lungs every 4 (four) hours as needed for wheezing or shortness of breath. 1 Inhaler 0   aspirin EC 81 MG tablet Take 1 tablet (81 mg total) by mouth daily. 90 tablet 3   atorvastatin (LIPITOR) 40 MG tablet Take 1 tablet (40 mg total) by mouth daily. 90 tablet 3   Continuous Blood Gluc Sensor (FREESTYLE LIBRE 14 DAY SENSOR) MISC USE SENSOR EVERY 14 DAYS. CHANGE SENSOR EVERY 2 WEEKS AS DIRECTED 6 each 2   dapagliflozin propanediol (FARXIGA) 10 MG TABS tablet Take 1 tablet (10 mg total) by mouth daily. 90 tablet 3   Dulaglutide (TRULICITY) 3 CB/7.6EG SOPN INJECT 3MG  SUBCUTANEOUSLY  ONCE A WEEK 12 mL 3   losartan (COZAAR) 50 MG tablet Take 1 tablet (50 mg total) by mouth daily. 90 tablet 3   metFORMIN (GLUCOPHAGE) 1000 MG tablet Take 1 tablet (1,000 mg total) by mouth 2 (two) times daily with a meal. 180 tablet 3   Omega-3 Fatty Acids (FISH OIL) 1000 MG CAPS Take 3 capsules (3,000 mg total) by mouth 2 (two) times daily. 180 capsule 11   OVER THE COUNTER MEDICATION 665 mg.     POTASSIUM  GLUCONATE PO Take 595 mg by mouth.     triamcinolone (NASACORT) 55 MCG/ACT AERO nasal inhaler Place 2 sprays into the nose daily. 1 Inhaler 12   No current facility-administered medications on file prior to visit.   No Known Allergies Family History  Problem Relation Age of Onset   Heart disease Mother    Cancer Father     PE: BP 138/78 (BP Location: Right Arm, Patient Position: Sitting, Cuff Size: Normal)   Pulse 77   Ht 5\' 10"  (1.778 m)   Wt 217 lb (98.4 kg)   SpO2 97%   BMI 31.14 kg/m  Wt Readings from Last 3 Encounters:  01/03/21 217 lb (98.4 kg)  09/14/20 222 lb  9.6 oz (101 kg)  08/23/20 226 lb (102.5 kg)   Constitutional: overweight, in NAD Eyes: PERRLA, EOMI, no exophthalmos ENT: moist mucous membranes, no thyromegaly, no cervical lymphadenopathy Cardiovascular: RRR, No MRG Respiratory: CTA B Gastrointestinal: abdomen soft, NT, ND, BS+ Musculoskeletal: no deformities, strength intact in all 4 Skin: moist, warm, no rashes Neurological: no tremor with outstretched hands, DTR normal in all 4  ASSESSMENT: 1. DM2, non-insulin-dependent, uncontrolled, without complications - CAD - s/p CABG in 2007 - CKD - PN - DR  2. HL  3. Obesity class I  PLAN:  1. Patient with longstanding, uncontrolled, type 2 diabetes, on metformin, SGLT2 inhibitor and weekly GLP-1 receptor agonist, with good control.  HbA1c at last visit was excellent, with an HbA1c of 6.4%, but 6.0% predicted from the CGM.  She had excellent blood sugars then except for a little bit more variability at night and lower blood sugars between 5 AM and 8 AM.  Upon questioning, he was occasionally taking glipizide before a larger meal at night and I suspected that he was dropping his blood sugars after this.  I advised him to stop glipizide.  Otherwise, we did not change his regimen. CGM interpretation: -At today's visit, we reviewed his CGM downloads: It appears that 157 of values are in target range (goal >70%),  while 24% are higher than 180 (goal <25%), and 0% are lower than 70 (goal <4%).  The calculated average blood sugar is 157.  The projected HbA1c for the next 3 months (GMI) is 7.1%. -Reviewing the CGM trends, it appears that sugars are higher, increasing especially after breakfast, after dinner, and after a snack at night.  We discussed about eliminating the snack but he feels hungry as they usually eat dinner early, around 6 PM.  In that case, he plans to replace it with a low-carb snack.  Overnight, sugars improved and they are at goal in the morning.  We discussed about improving diet and looking at different meals and see how day influence postprandial blood sugars.  He is aware that cereals will increase his blood sugars and I again advised him to try to eliminate them.  We discussed about the possibility of increasing Trulicity, but for now, we can work on improving his diet before increasing the dose of the medication, especially as he complains of some liquid stools.  I did suggest to change metformin to the extended release formulation to help with this. - I suggested to:  Patient Instructions  Please continue: - Farxiga 10 mg before b'fast - Trulicity 3 mg weekly  Change: - Metformin to ER 1000 mg 2x a day, with meals  Please return in 4 months.  - we checked his HbA1c: 7.3% (higher) - advised to check sugars at different times of the day - 4x a day, rotating check times - advised for yearly eye exams >> he is UTD - return to clinic in 4 months  2.  Hyperlipidemia -Reviewed latest lipid panel from 06/2020: LDL at goal, triglycerides high, HDL low: Lab Results  Component Value Date   CHOL 91 (L) 06/24/2020   HDL 29 (L) 06/24/2020   LDLCALC 27 06/24/2020   LDLDIRECT 51 06/21/2017   TRIG 229 (H) 06/24/2020   CHOLHDL 3.1 06/24/2020  - continues Lipitor 40, omega-3 fatty acids 3 g twice a day, without side effects.   3.  Obesity class I -continue SGLT 2 inhibitor and GLP-1  receptor agonist which should also help with weight loss -Weight was  stable at last visit, since then: lost 5 lbs  Philemon Kingdom, MD PhD [facility] Endocrinology

## 2021-01-03 NOTE — Patient Instructions (Addendum)
Please continue: - Farxiga 10 mg before b'fast - Trulicity 3 mg weekly  Change: - Metformin to ER 1000 mg 2x a day, with meals  Please return in 4 months.

## 2021-01-25 LAB — HM DIABETES EYE EXAM

## 2021-03-08 ENCOUNTER — Encounter: Payer: Self-pay | Admitting: Internal Medicine

## 2021-04-11 ENCOUNTER — Encounter: Payer: Self-pay | Admitting: Internal Medicine

## 2021-05-16 ENCOUNTER — Encounter: Payer: Self-pay | Admitting: Internal Medicine

## 2021-05-16 ENCOUNTER — Ambulatory Visit (INDEPENDENT_AMBULATORY_CARE_PROVIDER_SITE_OTHER): Payer: BC Managed Care – PPO | Admitting: Internal Medicine

## 2021-05-16 ENCOUNTER — Other Ambulatory Visit: Payer: Self-pay

## 2021-05-16 VITALS — BP 140/90 | HR 80 | Ht 70.0 in | Wt 211.0 lb

## 2021-05-16 DIAGNOSIS — Z23 Encounter for immunization: Secondary | ICD-10-CM | POA: Diagnosis not present

## 2021-05-16 DIAGNOSIS — E1159 Type 2 diabetes mellitus with other circulatory complications: Secondary | ICD-10-CM

## 2021-05-16 DIAGNOSIS — E1165 Type 2 diabetes mellitus with hyperglycemia: Secondary | ICD-10-CM | POA: Diagnosis not present

## 2021-05-16 LAB — POCT GLYCOSYLATED HEMOGLOBIN (HGB A1C): Hemoglobin A1C: 7.1 % — AB (ref 4.0–5.6)

## 2021-05-16 NOTE — Patient Instructions (Signed)
Please continue:  - Metformin ER 1000 mg 2x a day, with meals - Farxiga 10 mg before b'fast - Trulicity 3 mg weekly  Please return in 4 months.

## 2021-05-16 NOTE — Addendum Note (Signed)
Addended by: Lauralyn Primes on: 05/16/2021 02:15 PM   Modules accepted: Orders

## 2021-05-16 NOTE — Progress Notes (Signed)
Patient ID: [name] Love, male   DOB: Mar 26, 1956, 65 y.o.   MRN: [id number]   This visit occurred during the SARS-CoV-2 public health emergency.  Safety protocols were in place, including screening questions prior to the visit, additional usage of staff PPE, and extensive cleaning of exam room while observing appropriate contact time as indicated for disinfecting solutions.   HPI: [name] Love is a 65 y.o.-year-old male, initially referred by his PCP, Dr. Mitchel Honour, returning for follow-up for DM2, dx in ~2008, non-insulin-dependent, uncontrolled, with long term complications (CAD - s/p CABG in 2007, CKD, PN, DR).  Last visit 4 months ago.  Interim history: No increased urination, blurry vision, nausea, chest pain.  He just returned from a [place] in [place] [employer]).  Reviewing HbA1c levels: Lab Results  Component Value Date   HGBA1C 7.3 (A) 01/03/2021   HGBA1C 6.4 (A) 09/14/2020   HGBA1C 6.9 (A) 05/12/2020   HGBA1C 6.9 (A) 12/02/2019   HGBA1C 6.9 (A) 08/04/2019   HGBA1C 7.3 (H) 06/10/2019   HGBA1C 6.8 (A) 04/02/2019   HGBA1C 7.0 (A) 11/05/2018   HGBA1C 6.5 (A) 07/10/2018   HGBA1C 6.8 (A) 05/27/2018   HGBA1C 6.9 (A) 03/21/2018   HGBA1C 6.4 11/06/2017   HGBA1C 8.2 06/21/2017   HGBA1C 9.5 03/21/2017   HGBA1C 8.8 07/27/2016  He got a steroid inj in shoulder >> 05/10/2017.  Pt is on a regimen of: - Metformin 1000 mg 2x a day, with meals >> ER formulation - still some diarrhea - Farxiga 10 mg before b'fast - Trulicity 1.5 mg weekly-started 05/2017 >> 3 mg weekly-tolerated well  We stopped glipizide in 11/2019,  He checks his sugars more than 4 times a day with his freestyle libre CGM:   Previously:   Previously:   Lowest sugar was 66 (Libre) >> 80 >> 70; it is unclear at which level he has hypoglycemia awareness. Highest sugar was 365 (erroneous?) ...>> 200s >> 215.  Pt's meals are: - Breakfast: bagel, OJ >> cereals >> occas.  Cereals, or eggs  >> stopped OJ - Lunch: soup or sandwich or leftovers - Dinner: meat, veggies - Snacks: not daily  -+ Mild CKD, last BUN/creatinine:  Lab Results  Component Value Date   BUN 16 06/24/2020   BUN 14 06/10/2019   CREATININE 1.05 06/24/2020   CREATININE 1.18 06/10/2019  On losartan 50.  -+ HL; last set of lipids: Lab Results  Component Value Date   CHOL 91 (L) 06/24/2020   HDL 29 (L) 06/24/2020   LDLCALC 27 06/24/2020   LDLDIRECT 51 06/21/2017   TRIG 229 (H) 06/24/2020   CHOLHDL 3.1 06/24/2020  On Lipitor, omega-3 fatty acids 3000 mg 2x a day.  - last eye exam was in 01/2021: + DR.  Dr. Alois Cliche.  -+ Numbness and tingling in his feet.  On ASA 81.  ROS: + See HPI Neurological: no tremors/+ numbness/+ tingling/no dizziness  I reviewed pt's medications, allergies, PMH, social hx, family hx, and changes were documented in the history of present illness. Otherwise, unchanged from my initial visit note.  Past Medical History:  Diagnosis Date   Coronary artery disease    post CABG x3 in 2007   Diabetes mellitus    Hyperlipidemia    Hypertension    Obesity    Past Surgical History:  Procedure Laterality Date   CARDIAC CATHETERIZATION  EF= 45-50%   EF= 45-50% -- Three-vessel coronary artery disease with high-grade lesions in the proximal left anterior descending artery and multiple  lesions in the right coronary -- Low normal ejection fraction -- Stable exertional angina -- [name], M.D. [facility]   CORONARY ARTERY BYPASS GRAFT  02/19/2006     x3 (left internal mammary artery to LAD, left radial artery to posterior descending, saphenous vein graft to first diagonal), endoscopic vein harvest right leg -- SURGEON:  [name]. [name], M.D.   HERNIA REPAIR     at 65 y/o   tripple bypass     Social History   Socioeconomic History   Marital status: Married    Spouse name: Not on file   Number of children: 3  Social Needs  Occupational History    unemployed  Tobacco Use   Smoking status: Never Smoker   Smokeless tobacco: Never Used  Substance and Sexual Activity   Alcohol use: Yes    Alcohol/week:     Types: 1-2 per mo   Drug use: No      Other Topics      Social History Narrative      Current Outpatient Medications on File Prior to Visit  Medication Sig Dispense Refill   albuterol (PROVENTIL HFA;VENTOLIN HFA) 108 (90 Base) MCG/ACT inhaler Inhale 1-2 puffs into the lungs every 4 (four) hours as needed for wheezing or shortness of breath. 1 Inhaler 0   aspirin EC 81 MG tablet Take 1 tablet (81 mg total) by mouth daily. 90 tablet 3   atorvastatin (LIPITOR) 40 MG tablet Take 1 tablet (40 mg total) by mouth daily. 90 tablet 3   Continuous Blood Gluc Sensor (FREESTYLE LIBRE 14 DAY SENSOR) MISC USE SENSOR EVERY 14 DAYS. CHANGE SENSOR EVERY 2 WEEKS AS DIRECTED 6 each 2   dapagliflozin propanediol (FARXIGA) 10 MG TABS tablet Take 1 tablet (10 mg total) by mouth daily. 90 tablet 3   Dulaglutide (TRULICITY) 3 QI/2.9NL SOPN INJECT 3MG  SUBCUTANEOUSLY  ONCE A WEEK 12 mL 3   losartan (COZAAR) 50 MG tablet Take 1 tablet (50 mg total) by mouth daily. 90 tablet 3   metFORMIN (GLUCOPHAGE-XR) 500 MG 24 hr tablet Take 2 tablets (1,000 mg total) by mouth in the morning and at bedtime. 360 tablet 3   Omega-3 Fatty Acids (FISH OIL) 1000 MG CAPS Take 3 capsules (3,000 mg total) by mouth 2 (two) times daily. 180 capsule 11   OVER THE COUNTER MEDICATION 665 mg.     POTASSIUM GLUCONATE PO Take 595 mg by mouth.     triamcinolone (NASACORT) 55 MCG/ACT AERO nasal inhaler Place 2 sprays into the nose daily. 1 Inhaler 12   No current facility-administered medications on file prior to visit.   No Known Allergies Family History  Problem Relation Age of Onset   Heart disease Mother    Cancer Father     PE: BP 140/90 (BP Location: Right Arm, Patient Position: Sitting, Cuff Size: Normal)   Pulse 80   Ht 5\' 10"  (1.778 m)   Wt 211 lb (95.7 kg)   SpO2  97%   BMI 30.28 kg/m  Wt Readings from Last 3 Encounters:  05/16/21 211 lb (95.7 kg)  01/03/21 217 lb (98.4 kg)  09/14/20 222 lb 9.6 oz (101 kg)   Constitutional: overweight, in NAD Eyes: PERRLA, EOMI, no exophthalmos ENT: moist mucous membranes, no thyromegaly, no cervical lymphadenopathy Cardiovascular: RRR, No MRG Respiratory: CTA B Gastrointestinal: abdomen soft, NT, ND, BS+ Musculoskeletal: no deformities, strength intact in all 4 Skin: moist, warm, no rashes Neurological: no tremor with outstretched hands, DTR normal in all 4  ASSESSMENT: 1. DM2, non-insulin-dependent, uncontrolled, without complications - CAD - s/p CABG in 2007 - CKD - PN - DR  2. HL  3. Obesity class I  PLAN:  1. Patient with longstanding, uncontrolled, type 2 diabetes, on metformin, SGLT2 inhibitor, and weekly GLP-1 receptor agonist, with overall good control, but worsened at last visit, with an HbA1c of 7.3%.  At that time, sugars are higher, increasing especially after breakfast, after dinner, and snack at night.  We discussed about eliminating the snack but he was eating dinner early, around 6 PM and was hungry before going to bed.  We discussed about replacing the snack with a low-carb one.  We also entertain the idea of increasing Trulicity but he wanted to work on improving his meals first.  He was having some diarrhea and I recommended to change the metformin to the extended release formulation to help with this. CGM interpretation: -At today's visit, we reviewed his CGM downloads: It appears that 95% of values are in target range (goal >70%), while 5% are higher than 180 (goal <25%), and 0% are lower than 70 (goal <4%).  The calculated average blood sugar is 130.  The projected HbA1c for the next 3 months (GMI) is 6.4%. -Reviewing the CGM trends, it appears that his sugars are well controlled throughout the day and night, but he does have an increased blood sugars around 2 AM most likely due to  snacks at night.  However, sugars do not increase usually above 180.  For now, I would not suggest a change in regimen.  He does mention that his diarrhea is better after switching to metformin ER, but not completely resolved.  It is not very bothersome, though, and he would like to continue with the medication. - I suggested to:  Patient Instructions  Please continue:  - Metformin ER 1000 mg 2x a day, with meals - Farxiga 10 mg before b'fast - Trulicity 3 mg weekly  Please return in 4 months.  - we checked his HbA1c: 7.1% (lower)  - advised to check sugars at different times of the day - 4x a day, rotating check times - advised for yearly eye exams >> he is UTD - return to clinic in 4 months   2.  Hyperlipidemia -Reviewed latest lipid panel from 06/2020: Triglycerides high, LDL at goal, HDL low: Lab Results  Component Value Date   CHOL 91 (L) 06/24/2020   HDL 29 (L) 06/24/2020   LDLCALC 27 06/24/2020   LDLDIRECT 51 06/21/2017   TRIG 229 (H) 06/24/2020   CHOLHDL 3.1 06/24/2020  -He continues on Lipitor 40 mg daily, omega-3 fatty acids 3 g twice a day, without side effects  3.  Obesity class I -continue SGLT 2 inhibitor and GLP-1 receptor agonist which should also help with weight loss -He lost 5 pounds before last visit and 6 more since then  Philemon Kingdom, MD PhD [facility]

## 2021-05-22 ENCOUNTER — Other Ambulatory Visit: Payer: Self-pay | Admitting: Internal Medicine

## 2021-05-22 DIAGNOSIS — E1159 Type 2 diabetes mellitus with other circulatory complications: Secondary | ICD-10-CM

## 2021-05-27 ENCOUNTER — Encounter: Payer: Self-pay | Admitting: Internal Medicine

## 2021-07-04 ENCOUNTER — Other Ambulatory Visit: Payer: Self-pay | Admitting: Emergency Medicine

## 2021-07-04 ENCOUNTER — Other Ambulatory Visit: Payer: Self-pay

## 2021-07-04 ENCOUNTER — Ambulatory Visit (INDEPENDENT_AMBULATORY_CARE_PROVIDER_SITE_OTHER): Payer: Medicare PPO | Admitting: Emergency Medicine

## 2021-07-04 ENCOUNTER — Encounter: Payer: Self-pay | Admitting: Emergency Medicine

## 2021-07-04 VITALS — BP 130/70 | HR 81 | Temp 98.1°F | Ht 70.0 in | Wt 212.0 lb

## 2021-07-04 DIAGNOSIS — E785 Hyperlipidemia, unspecified: Secondary | ICD-10-CM

## 2021-07-04 DIAGNOSIS — I10 Essential (primary) hypertension: Secondary | ICD-10-CM

## 2021-07-04 DIAGNOSIS — Z951 Presence of aortocoronary bypass graft: Secondary | ICD-10-CM | POA: Diagnosis not present

## 2021-07-04 DIAGNOSIS — Z130 Encounter for screening for diseases of the blood and blood-forming organs and certain disorders involving the immune mechanism: Secondary | ICD-10-CM

## 2021-07-04 DIAGNOSIS — I2510 Atherosclerotic heart disease of native coronary artery without angina pectoris: Secondary | ICD-10-CM

## 2021-07-04 DIAGNOSIS — Z0000 Encounter for general adult medical examination without abnormal findings: Secondary | ICD-10-CM

## 2021-07-04 DIAGNOSIS — D649 Anemia, unspecified: Secondary | ICD-10-CM

## 2021-07-04 DIAGNOSIS — Z23 Encounter for immunization: Secondary | ICD-10-CM

## 2021-07-04 DIAGNOSIS — Z1211 Encounter for screening for malignant neoplasm of colon: Secondary | ICD-10-CM

## 2021-07-04 LAB — CBC WITH DIFFERENTIAL/PLATELET
Basophils Absolute: 0 10*3/uL (ref 0.0–0.1)
Basophils Relative: 0.9 % (ref 0.0–3.0)
Eosinophils Absolute: 0.1 10*3/uL (ref 0.0–0.7)
Eosinophils Relative: 2.2 % (ref 0.0–5.0)
HCT: 34.3 % — ABNORMAL LOW (ref 39.0–52.0)
Hemoglobin: 10.6 g/dL — ABNORMAL LOW (ref 13.0–17.0)
Lymphocytes Relative: 25.5 % (ref 12.0–46.0)
Lymphs Abs: 1.2 10*3/uL (ref 0.7–4.0)
MCHC: 30.9 g/dL (ref 30.0–36.0)
MCV: 67 fl — ABNORMAL LOW (ref 78.0–100.0)
Monocytes Absolute: 0.4 10*3/uL (ref 0.1–1.0)
Monocytes Relative: 8.7 % (ref 3.0–12.0)
Neutro Abs: 3 10*3/uL (ref 1.4–7.7)
Neutrophils Relative %: 62.7 % (ref 43.0–77.0)
Platelets: 186 10*3/uL (ref 150.0–400.0)
RBC: 5.12 Mil/uL (ref 4.22–5.81)
RDW: 18.2 % — ABNORMAL HIGH (ref 11.5–15.5)
WBC: 4.8 10*3/uL (ref 4.0–10.5)

## 2021-07-04 LAB — LIPID PANEL
Cholesterol: 87 mg/dL (ref 0–200)
HDL: 31.6 mg/dL — ABNORMAL LOW (ref >39.00)
LDL Cholesterol: 25 mg/dL (ref 0–99)
NonHDL: 55.49
Total CHOL/HDL Ratio: 3
Triglycerides: 152 mg/dL — ABNORMAL HIGH (ref 0.0–149.0)
VLDL: 30.4 mg/dL (ref 0.0–40.0)

## 2021-07-04 LAB — COMPREHENSIVE METABOLIC PANEL
ALT: 15 U/L (ref 0–53)
AST: 13 U/L (ref 0–37)
Albumin: 4.2 g/dL (ref 3.5–5.2)
Alkaline Phosphatase: 43 U/L (ref 39–117)
BUN: 13 mg/dL (ref 6–23)
CO2: 27 mEq/L (ref 19–32)
Calcium: 9.3 mg/dL (ref 8.4–10.5)
Chloride: 105 mEq/L (ref 96–112)
Creatinine, Ser: 1.03 mg/dL (ref 0.40–1.50)
GFR: 76.51 mL/min (ref >60.00)
Glucose, Bld: 152 mg/dL — ABNORMAL HIGH (ref 70–99)
Potassium: 4.2 mEq/L (ref 3.5–5.1)
Sodium: 141 mEq/L (ref 135–145)
Total Bilirubin: 0.4 mg/dL (ref 0.2–1.2)
Total Protein: 6.8 g/dL (ref 6.0–8.3)

## 2021-07-04 MED ORDER — LOSARTAN POTASSIUM 50 MG PO TABS: 50.0000 mg | ORAL_TABLET | Freq: Every day | ORAL | 3 refills | Status: DC

## 2021-07-04 NOTE — Addendum Note (Signed)
Addended by: Durwin Nora on: 07/04/2021 01:35 PM   Modules accepted: Orders

## 2021-07-04 NOTE — Patient Instructions (Signed)

## 2021-07-04 NOTE — Progress Notes (Signed)
[name] Love 65 y.o.   Chief Complaint  Patient presents with   Annual Exam    HISTORY OF PRESENT ILLNESS: This is a 65 y.o. male here for annual exam. Has history of diabetes and sees endocrinologist Dr. Cruzita Lederer on a regular basis.  Presently taking Trulicity, metformin, and [place]. History of coronary artery disease, no previous MI, status post CABG in 2012, sees cardiologist on a regular basis.  Stable. On atorvastatin 40 mg daily.  Also history of hypertension on losartan 50 mg daily.  HPI   Prior to Admission medications   Medication Sig Start Date End Date Taking? Authorizing Provider  albuterol (PROVENTIL HFA;VENTOLIN HFA) 108 (90 Base) MCG/ACT inhaler Inhale 1-2 puffs into the lungs every 4 (four) hours as needed for wheezing or shortness of breath. 08/10/18  Yes [name], MD  aspirin EC 81 MG tablet Take 1 tablet (81 mg total) by mouth daily. 07/30/18  Yes Imogene Burn, PA-C  atorvastatin (LIPITOR) 40 MG tablet Take 1 tablet (40 mg total) by mouth daily. 08/23/20  Yes [name], MD  Continuous Blood Gluc Sensor (FREESTYLE LIBRE 14 DAY SENSOR) MISC USE SENSOR EVERY 14 DAYS. CHANGE SENSOR EVERY 2 WEEKS AS DIRECTED 05/23/21  Yes Philemon Kingdom, MD  dapagliflozin propanediol (FARXIGA) 10 MG TABS tablet Take 1 tablet (10 mg total) by mouth daily. 09/14/20  Yes Philemon Kingdom, MD  Dulaglutide (TRULICITY) 3 ZO/1.0RU SOPN INJECT 3MG  SUBCUTANEOUSLY  ONCE A WEEK 08/18/20  Yes Philemon Kingdom, MD  losartan (COZAAR) 50 MG tablet Take 1 tablet (50 mg total) by mouth daily. 06/24/20  Yes [name], MD  metFORMIN (GLUCOPHAGE-XR) 500 MG 24 hr tablet Take 2 tablets (1,000 mg total) by mouth in the morning and at bedtime. 01/03/21  Yes Philemon Kingdom, MD  Omega-3 Fatty Acids (FISH OIL) 1000 MG CAPS Take 3 capsules (3,000 mg total) by mouth 2 (two) times daily. 08/19/19  Yes [name], MD  OVER THE COUNTER MEDICATION 665 mg.   Yes [provider]  POTASSIUM GLUCONATE PO Take 595 mg by mouth.   Yes [provider]  triamcinolone (NASACORT) 55 MCG/ACT AERO nasal inhaler Place 2 sprays into the nose daily. 08/17/17  Yes [name], MD    No Known Allergies  Patient Active Problem List   Diagnosis Date Noted   Decreased calculated GFR 08/08/2017   Erectile dysfunction 09/15/2013   Nonspecific abnormal electrocardiogram (ECG) (EKG) 06/10/2013   Poorly controlled type 2 diabetes mellitus with circulatory disorder ([place]) 09/12/2012   Coronary artery disease 07/14/2011   History of coronary artery bypass surgery 07/14/2011   Hyperlipidemia 02/23/2009   Obesity 02/23/2009   Essential hypertension 02/23/2009    Past Medical History:  Diagnosis Date   Coronary artery disease    post CABG x3 in 2007   Diabetes mellitus    Hyperlipidemia    Hypertension    Obesity     Past Surgical History:  Procedure Laterality Date   CARDIAC CATHETERIZATION  EF= 45-50%   EF= 45-50% -- Three-vessel coronary artery disease with high-grade lesions in the proximal left anterior descending artery and multiple lesions in the right coronary -- Low normal ejection fraction -- Stable exertional angina -- [name], M.D. [facility]   CORONARY ARTERY BYPASS GRAFT  02/19/2006     x3 (left internal mammary artery to LAD, left radial artery to posterior descending, saphenous vein graft to first diagonal), endoscopic vein harvest right leg -- SURGEON:  [name].  [name], M.D.   HERNIA REPAIR     at 65 y/o   tripple bypass      Social History   Socioeconomic History   Marital status: Married    Spouse name: Not on file   Number of children: Not on file   Years of education: Not on file   Highest education level: Not on file  Occupational History   Not on file  Tobacco Use   Smoking status: Never   Smokeless tobacco: Never  Substance and Sexual Activity   Alcohol use: Yes    Alcohol/week: 1.0 standard drink     Types: 1 Standard drinks or equivalent per week   Drug use: No   Sexual activity: Yes  Other Topics Concern   Not on file  Social History Narrative   Not on file   Social Determinants of Health   Financial Resource Strain: Not on file  Food Insecurity: Not on file  Transportation Needs: Not on file  Physical Activity: Not on file  Stress: Not on file  Social Connections: Not on file  Intimate Partner Violence: Not on file    Family History  Problem Relation Age of Onset   Heart disease Mother    Cancer Father      Review of Systems  Constitutional: Negative.  Negative for chills and fever.  HENT: Negative.  Negative for congestion and sore throat.   Eyes: Negative.   Respiratory: Negative.  Negative for cough and shortness of breath.   Cardiovascular: Negative.  Negative for chest pain and palpitations.  Gastrointestinal: Negative.  Negative for abdominal pain, diarrhea, nausea and vomiting.  Genitourinary: Negative.  Negative for dysuria and hematuria.  Musculoskeletal: Negative.  Negative for back pain, myalgias and neck pain.  Skin: Negative.  Negative for rash.  Neurological: Negative.  Negative for dizziness and headaches.  All other systems reviewed and are negative.  Today's Vitals   07/04/21 1107  BP: (!) 162/68  Pulse: 81  Temp: 98.1 F (36.7 C)  TempSrc: Oral  SpO2: 98%  Weight: 212 lb (96.2 kg)  Height: 5\' 10"  (1.778 m)   Body mass index is 30.42 kg/m.  Physical Exam Vitals reviewed.  Constitutional:      Appearance: Normal appearance.  HENT:     Head: Normocephalic.     Right Ear: Tympanic membrane, ear canal and external ear normal.     Left Ear: Tympanic membrane, ear canal and external ear normal.     Mouth/Throat:     Mouth: Mucous membranes are moist.     Pharynx: Oropharynx is clear.  Eyes:     Extraocular Movements: Extraocular movements intact.     Conjunctiva/sclera: Conjunctivae normal.     Pupils: Pupils are equal, round, and  reactive to light.  Cardiovascular:     Rate and Rhythm: Normal rate and regular rhythm.     Pulses: Normal pulses.     Heart sounds: Normal heart sounds.  Pulmonary:     Effort: Pulmonary effort is normal.     Breath sounds: Normal breath sounds.  Abdominal:     General: Bowel sounds are normal. There is no distension.     Palpations: Abdomen is soft. There is no mass.     Tenderness: There is no abdominal tenderness.  Musculoskeletal:        General: Normal range of motion.     Cervical back: Normal range of motion and neck supple. No tenderness.     Right lower leg: No  edema.     Left lower leg: No edema.  Lymphadenopathy:     Cervical: No cervical adenopathy.  Skin:    General: Skin is warm and dry.     Capillary Refill: Capillary refill takes less than 2 seconds.  Neurological:     General: No focal deficit present.     Mental Status: He is alert and oriented to person, place, and time.  Psychiatric:        Mood and Affect: Mood normal.        Behavior: Behavior normal.     ASSESSMENT & PLAN: Problem List Items Addressed This Visit       Cardiovascular and Mediastinum   Essential hypertension   Relevant Medications   losartan (COZAAR) 50 MG tablet   Other Relevant Orders   Comprehensive metabolic panel   Coronary artery disease   Relevant Medications   losartan (COZAAR) 50 MG tablet     Other   History of coronary artery bypass surgery   Other Visit Diagnoses     Routine general medical examination at a health care facility    -  Primary   Dyslipidemia       Relevant Orders   Lipid panel   Screening for deficiency anemia       Relevant Orders   CBC with Differential      Modifiable risk factors discussed with patient. Anticipatory guidance according to age provided. The following topics were also discussed: Social Determinants of Health Smoking.  Non-smoker Diet and nutrition and need to decrease amount of daily carbohydrate intake. Benefits of  exercise.  Patient limited due to musculoskeletal conditions in particular back and hips. Cancer screening and possible review of most recent Cologuard result. Vaccinations recommendations including need for pneumonia and shingles vaccines Cardiovascular chronic conditions review. Medication list reviewed. Mental health including depression and anxiety Fall and accident prevention  Patient Instructions  Health Maintenance, Male Adopting a healthy lifestyle and getting preventive care are important in promoting health and wellness. Ask your health care provider about: The right schedule for you to have regular tests and exams. Things you can do on your own to prevent diseases and keep yourself healthy. What should I know about diet, weight, and exercise? Eat a healthy diet  Eat a diet that includes plenty of vegetables, fruits, low-fat dairy products, and lean protein. Do not eat a lot of foods that are high in solid fats, added sugars, or sodium. Maintain a healthy weight Body mass index (BMI) is a measurement that can be used to identify possible weight problems. It estimates body fat based on height and weight. Your health care provider can help determine your BMI and help you achieve or maintain a healthy weight. Get regular exercise Get regular exercise. This is one of the most important things you can do for your health. Most adults should: Exercise for at least 150 minutes each week. The exercise should increase your heart rate and make you sweat (moderate-intensity exercise). Do strengthening exercises at least twice a week. This is in addition to the moderate-intensity exercise. Spend less time sitting. Even light physical activity can be beneficial. Watch cholesterol and blood lipids Have your blood tested for lipids and cholesterol at 65 years of age, then have this test every 5 years. You may need to have your cholesterol levels checked more often if: Your lipid or cholesterol  levels are high. You are older than 65 years of age. You are at high risk for heart  disease. What should I know about cancer screening? Many types of cancers can be detected early and may often be prevented. Depending on your health history and family history, you may need to have cancer screening at various ages. This may include screening for: Colorectal cancer. Prostate cancer. Skin cancer. Lung cancer. What should I know about heart disease, diabetes, and high blood pressure? Blood pressure and heart disease High blood pressure causes heart disease and increases the risk of stroke. This is more likely to develop in people who have high blood pressure readings or are overweight. Talk with your health care provider about your target blood pressure readings. Have your blood pressure checked: Every 3-5 years if you are 29-75 years of age. Every year if you are 5 years old or older. If you are between the ages of 79 and 27 and are a current or former smoker, ask your health care provider if you should have a one-time screening for abdominal aortic aneurysm (AAA). Diabetes Have regular diabetes screenings. This checks your fasting blood sugar level. Have the screening done: Once every three years after age 73 if you are at a normal weight and have a low risk for diabetes. More often and at a younger age if you are overweight or have a high risk for diabetes. What should I know about preventing infection? Hepatitis B If you have a higher risk for hepatitis B, you should be screened for this virus. Talk with your health care provider to find out if you are at risk for hepatitis B infection. Hepatitis C Blood testing is recommended for: Everyone born from [birth year] through 1965. Anyone with known risk factors for hepatitis C. Sexually transmitted infections (STIs) You should be screened each year for STIs, including gonorrhea and chlamydia, if: You are sexually active and are younger than 65  years of age. You are older than 65 years of age and your health care provider tells you that you are at risk for this type of infection. Your sexual activity has changed since you were last screened, and you are at increased risk for chlamydia or gonorrhea. Ask your health care provider if you are at risk. Ask your health care provider about whether you are at high risk for HIV. Your health care provider may recommend a prescription medicine to help prevent HIV infection. If you choose to take medicine to prevent HIV, you should first get tested for HIV. You should then be tested every 3 months for as long as you are taking the medicine. Follow these instructions at home: Alcohol use Do not drink alcohol if your health care provider tells you not to drink. If you drink alcohol: Limit how much you have to 0-2 drinks a day. Know how much alcohol is in your drink. In the [place] one drink equals one 12 oz bottle of beer (355 mL), one 5 oz glass of wine (148 mL), or one 1 oz glass of hard liquor (44 mL). Lifestyle Do not use any products that contain nicotine or tobacco. These products include cigarettes, chewing tobacco, and vaping devices, such as e-cigarettes. If you need help quitting, ask your health care provider. Do not use street drugs. Do not share needles. Ask your health care provider for help if you need support or information about quitting drugs. General instructions Schedule regular health, dental, and eye exams. Stay current with your vaccines. Tell your health care provider if: You often feel depressed. You have ever been abused or do not feel  safe at home. Summary Adopting a healthy lifestyle and getting preventive care are important in promoting health and wellness. Follow your health care provider's instructions about healthy diet, exercising, and getting tested or screened for diseases. Follow your health care provider's instructions on monitoring your cholesterol and blood  pressure. This information is not intended to replace advice given to you by your health care provider. Make sure you discuss any questions you have with your health care provider. Document Revised: 11/29/2020 Document Reviewed: 11/29/2020 Elsevier Patient Education  2022 [place], MD [facility]

## 2021-07-04 NOTE — Progress Notes (Signed)
Call lab and ask them to do anemia profile please.

## 2021-07-06 ENCOUNTER — Other Ambulatory Visit: Payer: Self-pay

## 2021-07-06 DIAGNOSIS — D649 Anemia, unspecified: Secondary | ICD-10-CM

## 2021-07-06 LAB — IBC + FERRITIN
Ferritin: 4.1 ng/mL — ABNORMAL LOW (ref 22.0–322.0)
Iron: 19 ug/dL — ABNORMAL LOW (ref 42–165)
Saturation Ratios: 3.3 % — ABNORMAL LOW (ref 20.0–50.0)
TIBC: 569.8 ug/dL — ABNORMAL HIGH (ref 250.0–450.0)
Transferrin: 407 mg/dL — ABNORMAL HIGH (ref 212.0–360.0)

## 2021-07-06 NOTE — Addendum Note (Signed)
Addended by: Durwin Nora on: 07/06/2021 10:24 AM   Modules accepted: Orders

## 2021-07-07 ENCOUNTER — Other Ambulatory Visit: Payer: Self-pay | Admitting: Emergency Medicine

## 2021-07-07 DIAGNOSIS — M25552 Pain in left hip: Secondary | ICD-10-CM | POA: Insufficient documentation

## 2021-07-11 ENCOUNTER — Encounter: Payer: Self-pay | Admitting: Gastroenterology

## 2021-08-05 DIAGNOSIS — M5416 Radiculopathy, lumbar region: Secondary | ICD-10-CM | POA: Insufficient documentation

## 2021-08-11 ENCOUNTER — Ambulatory Visit (INDEPENDENT_AMBULATORY_CARE_PROVIDER_SITE_OTHER): Payer: Medicare PPO | Admitting: Gastroenterology

## 2021-08-11 ENCOUNTER — Encounter: Payer: Self-pay | Admitting: Gastroenterology

## 2021-08-11 VITALS — BP 134/50 | HR 80 | Ht 70.0 in | Wt 215.1 lb

## 2021-08-11 DIAGNOSIS — D509 Iron deficiency anemia, unspecified: Secondary | ICD-10-CM

## 2021-08-11 NOTE — Progress Notes (Signed)
HPI : [name] Love is a very pleasant 66 year old male with a history of diabetes and coronary artery disease s/p CABG who is referred to [place] by Mr. Agustina Caroli for further evaluation of anemia.  He was found to have a hgb of 10.6 with an MCV of 67 on Dec 12, which was down from Hgb 13.3 with MCV 78 in Nov 2020.  A subsequent iron panel was consistent with severe iron deficiency, with a ferritin 4.1, iron sat 3.3%, serum iron 19, TIBC 570.  He denies ever being told he has had low blood or iron in the past.  He does donate blood on occasion, with the last occasion about 4 months ago.  He tried to donate last month but was denied because of low iron.  He says this is the first time he has ever been denied blood transfusion.  He denies any history of overt GI blood loss such as melena or hematochezia.  He does report chronic loose stools, usually 1-3 bowel movements/day.  Stools are not typically solid.  No abdominal pain.  No constipation.   He denies any chronic upper GI symptoms such as frequent heartburn, acid regurgitation, dysphagia, nausea/vomiting, dyspepsia or early satiety.  He has lost about 20 lbs in the past year, which he attributes to starting Trulicity  He does report feeling tired more than usual and some possible shortness of breath with exertion.  He had a CABG in 2007 and denies any heart problems or symptoms since.  He takes a baby aspirin but no other blood thinners/antiplatelet agents.  He has never had a colonoscopy.  He had a negative Cologuard  in 2020. His mother was diagnosed with colon cancer at age 66.  No other family history of GI malignancy.  Past Medical History:  Diagnosis Date   Anemia    Arthritis    Coronary artery disease    post CABG x3 in 2007   Diabetes mellitus    Hyperlipidemia    Hypertension    Kidney stone    Obesity      Past Surgical History:  Procedure Laterality Date   CARDIAC CATHETERIZATION  EF= 45-50%   EF=  45-50% -- Three-vessel coronary artery disease with high-grade lesions in the proximal left anterior descending artery and multiple lesions in the right coronary -- Low normal ejection fraction -- Stable exertional angina -- [name], M.D. [facility]   CORONARY ARTERY BYPASS GRAFT  02/19/2006     x3 (left internal mammary artery to LAD, left radial artery to posterior descending, saphenous vein graft to first diagonal), endoscopic vein harvest right leg -- SURGEON:  [name]. [name], M.D.   HERNIA REPAIR     at 66 y/o   tripple bypass     Family History  Problem Relation Age of Onset   Heart disease Mother    Colon cancer Mother        63   Hypertension Mother    Cancer Father        type unknown   Heart attack Maternal Grandfather 38   Diabetes Maternal Uncle    Kidney disease Maternal Aunt    Social History   Tobacco Use   Smoking status: Never   Smokeless tobacco: Never  Vaping Use   Vaping Use: Never used  Substance Use Topics   Alcohol use: Yes    Alcohol/week: 1.0 standard drink    Types: 1 Standard drinks or equivalent per week  Comment: 1 drink monthly   Drug use: No   Current Outpatient Medications  Medication Sig Dispense Refill   aspirin EC 81 MG tablet Take 1 tablet (81 mg total) by mouth daily. 90 tablet 3   atorvastatin (LIPITOR) 40 MG tablet Take 1 tablet (40 mg total) by mouth daily. 90 tablet 3   Continuous Blood Gluc Sensor (FREESTYLE LIBRE 14 DAY SENSOR) MISC USE SENSOR EVERY 14 DAYS. CHANGE SENSOR EVERY 2 WEEKS AS DIRECTED 6 each 0   dapagliflozin propanediol (FARXIGA) 10 MG TABS tablet Take 1 tablet (10 mg total) by mouth daily. 90 tablet 3   Dulaglutide (TRULICITY) 3 PX/1.0GY SOPN INJECT 3MG  SUBCUTANEOUSLY  ONCE A WEEK 12 mL 3   losartan (COZAAR) 50 MG tablet Take 1 tablet (50 mg total) by mouth daily. 90 tablet 3   Magnesium Glycinate 665 MG CAPS Take 1 tablet by mouth in the morning and at bedtime.     metFORMIN (GLUCOPHAGE-XR) 500 MG 24 hr  tablet Take 2 tablets (1,000 mg total) by mouth in the morning and at bedtime. 360 tablet 3   Omega-3 Fatty Acids (FISH OIL) 1000 MG CAPS Take 3 capsules (3,000 mg total) by mouth 2 (two) times daily. 180 capsule 11   POTASSIUM GLUCONATE PO Take 595 mg by mouth 2 (two) times daily.     No current facility-administered medications for this visit.   No Known Allergies   Review of Systems: All systems reviewed and negative except where noted in HPI.    No results found.  Physical Exam: BP (!) 134/50 (BP Location: Left Arm, Patient Position: Sitting, Cuff Size: Normal)    Pulse 80    Ht 5\' 10"  (1.778 m)    Wt 215 lb 2 oz (97.6 kg)    BMI 30.87 kg/m  Constitutional: Pleasant,well-developed, Caucasian male in no acute distress. HEENT: Normocephalic and atraumatic. Conjunctivae are normal. No scleral icterus. Neck supple.  Cardiovascular: Normal rate, regular rhythm.  Pulmonary/chest: Effort normal and breath sounds normal. No wheezing, rales or rhonchi. Abdominal: Soft, nondistended, nontender. Bowel sounds active throughout. There are no masses palpable. No hepatomegaly. Extremities: no edema Lymphadenopathy: No cervical adenopathy noted. Neurological: Alert and oriented to person place and time. Skin: Skin is warm and dry. No rashes noted. Psychiatric: Normal mood and affect. Behavior is normal.  CBC    Component Value Date/Time   WBC 4.8 07/04/2021 1154   RBC 5.12 07/04/2021 1154   HGB 10.6 (L) 07/04/2021 1154   HGB 13.3 06/10/2019 1143   HCT 34.3 (L) 07/04/2021 1154   HCT 41.8 06/10/2019 1143   PLT 186.0 07/04/2021 1154   PLT 205 06/10/2019 1143   MCV 67.0 Repeated and verified X2. (L) 07/04/2021 1154   MCV 78 (L) 06/10/2019 1143   MCH 24.9 (L) 06/10/2019 1143   MCH 28.5 10/08/2015 1121   MCH 27.3 09/15/2013 0828   MCHC 30.9 07/04/2021 1154   RDW 18.2 (H) 07/04/2021 1154   RDW 15.0 06/10/2019 1143   LYMPHSABS 1.2 07/04/2021 1154   LYMPHSABS 1.3 06/10/2019 1143    MONOABS 0.4 07/04/2021 1154   EOSABS 0.1 07/04/2021 1154   EOSABS 0.1 06/10/2019 1143   BASOSABS 0.0 07/04/2021 1154   BASOSABS 0.0 06/10/2019 1143    CMP     Component Value Date/Time   NA 141 07/04/2021 1154   NA 138 06/24/2020 0930   K 4.2 07/04/2021 1154   CL 105 07/04/2021 1154   CO2 27 07/04/2021 1154   GLUCOSE 152 (  H) 07/04/2021 1154   BUN 13 07/04/2021 1154   BUN 16 06/24/2020 0930   CREATININE 1.03 07/04/2021 1154   CREATININE 1.48 (H) 05/29/2017 1422   CALCIUM 9.3 07/04/2021 1154   PROT 6.8 07/04/2021 1154   PROT 7.1 06/24/2020 0930   ALBUMIN 4.2 07/04/2021 1154   ALBUMIN 4.7 06/24/2020 0930   AST 13 07/04/2021 1154   ALT 15 07/04/2021 1154   ALKPHOS 43 07/04/2021 1154   BILITOT 0.4 07/04/2021 1154   BILITOT 0.5 06/24/2020 0930   GFRNONAA 75 06/24/2020 0930   GFRNONAA 51 (L) 05/29/2017 1422   GFRAA 87 06/24/2020 0930   GFRAA 59 (L) 05/29/2017 1422     ASSESSMENT AND PLAN: 66 year old male with diabetes and remote history of CAD s/p CABG with new onset iron deficiency anemia without overt GI blood loss.  He had a negative Cologuard 2 years ago and does donate blood on occasion, but not regularly.  He had a three point drop in Hgb over two years and has very low iron indices.  Although blood donation is a potential cause of iron deficiency, an endoscopic evaluation is certainly indicated.  Given the absence of any symptoms, I recommended both an upper and lower endoscopy.  Will schedule patient for EGD/colonoscopy.  Patient is taking oral iron supplementation currently, he should continue this.  Iron deficiency anemia -EGD/colonoscopy  The details, risks (including bleeding, perforation, infection, missed lesions, medication reactions and possible hospitalization or surgery if complications occur), benefits, and alternatives to EGD/colonoscopy with possible biopsy and possible polypectomy were discussed with the patient and he consents to proceed.   CC:  [name], [place]

## 2021-08-11 NOTE — Patient Instructions (Signed)
If you are age 66 or older, your body mass index should be between 23-30. Your Body mass index is 30.87 kg/m. If this is out of the aforementioned range listed, please consider follow up with your Primary Care Provider.  If you are age 6 or younger, your body mass index should be between 19-25. Your Body mass index is 30.87 kg/m. If this is out of the aformentioned range listed, please consider follow up with your Primary Care Provider.  It has been recommended to you by your physician that you have a(n) Colonoscopy and Endoscopy completed. Per your request, we did not schedule the procedure(s) today. Please contact our office at [phone] should you decide to have the procedure completed. You will be scheduled for a pre-visit and procedure at that time.  The [place] [facility] providers would like to encourage you to use [facility] to communicate with providers for non-urgent requests or questions.  Due to long hold times on the telephone, sending your provider a message by [facility] may be a faster and more efficient way to get a response.  Please allow 48 business hours for a response.  Please remember that this is for non-urgent requests.   It was a pleasure to see you today!  Thank you for trusting me with your gastrointestinal care!    Scott E.Candis Schatz , MD

## 2021-08-14 ENCOUNTER — Encounter: Payer: Self-pay | Admitting: Gastroenterology

## 2021-08-17 ENCOUNTER — Other Ambulatory Visit: Payer: Self-pay

## 2021-08-17 DIAGNOSIS — D509 Iron deficiency anemia, unspecified: Secondary | ICD-10-CM

## 2021-08-17 MED ORDER — NA SULFATE-K SULFATE-MG SULF 17.5-3.13-1.6 GM/177ML PO SOLN: 1.0000 | Freq: Once | ORAL | 0 refills | Status: AC

## 2021-08-21 NOTE — Progress Notes (Signed)
Cardiology Office Note   Date:  08/22/2021   ID:  [name] Love, [name] Love 12-Mar-1956, MRN [id number]  PCP:  [name] Pollen, MD    No chief complaint on file.  CAD  Wt Readings from Last 3 Encounters:  08/22/21 219 lb (99.3 kg)  08/11/21 215 lb 2 oz (97.6 kg)  07/04/21 212 lb (96.2 kg)       History of Present Illness: [name] Love is a 66 y.o. male     who had CABG in 2007.  He had a nuclear stress test in 2012 that was ok.   Echo in 2014: Left ventricle: The cavity size was normal. There was mild   focal basal hypertrophy of the septum. Systolic function   was normal. The estimated ejection fraction was in the   range of 55% to 60%. Wall motion was normal; there were no   regional wall motion abnormalities. - Left atrium: The atrium was mildly dilated.   He does the elliptical on a fairly regular basis.  No cardiac sx.  He has to rehab his shoulders.     Got platelet rich plasma plasma injections for his hamstrings. Hamstrings improved.  Limited by back pain.  Walking limited.    Bowls once a week.  Plays golf.   No problems with the COVID vaccines.     Found to be anemic.  EGD and colonoscopy for anemia coming up.  Denies : Chest pain. Dizziness. Leg edema. Nitroglycerin use. Orthopnea. Palpitations. Paroxysmal nocturnal dyspnea. Shortness of breath. Syncope.     Past Medical History:  Diagnosis Date   Anemia    Arthritis    Coronary artery disease    post CABG x3 in 2007   Diabetes mellitus    Hyperlipidemia    Hypertension    Kidney stone    Obesity     Past Surgical History:  Procedure Laterality Date   CARDIAC CATHETERIZATION  EF= 45-50%   EF= 45-50% -- Three-vessel coronary artery disease with high-grade lesions in the proximal left anterior descending artery and multiple lesions in the right coronary -- Low normal ejection fraction -- Stable exertional angina -- [name] Love, M.D. [facility]   CORONARY ARTERY BYPASS GRAFT   02/19/2006     x3 (left internal mammary artery to LAD, left radial artery to posterior descending, saphenous vein graft to first diagonal), endoscopic vein harvest right leg -- SURGEON:  [name] Love. [name] Love, M.D.   HERNIA REPAIR     at 66 y/o   tripple bypass       Current Outpatient Medications  Medication Sig Dispense Refill   aspirin EC 81 MG tablet Take 1 tablet (81 mg total) by mouth daily. 90 tablet 3   atorvastatin (LIPITOR) 40 MG tablet Take 1 tablet (40 mg total) by mouth daily. 90 tablet 3   Continuous Blood Gluc Sensor (FREESTYLE LIBRE 14 DAY SENSOR) MISC USE SENSOR EVERY 14 DAYS. CHANGE SENSOR EVERY 2 WEEKS AS DIRECTED 6 each 0   dapagliflozin propanediol (FARXIGA) 10 MG TABS tablet Take 1 tablet (10 mg total) by mouth daily. 90 tablet 3   Dulaglutide (TRULICITY) 3 ER/1.5QM SOPN INJECT 3MG  SUBCUTANEOUSLY  ONCE A WEEK 12 mL 3   losartan (COZAAR) 50 MG tablet Take 1 tablet (50 mg total) by mouth daily. 90 tablet 3   Magnesium Glycinate 665 MG CAPS Take 1 tablet by mouth in the morning and at bedtime.     metFORMIN (GLUCOPHAGE-XR) 500 MG 24 hr tablet Take 2  tablets (1,000 mg total) by mouth in the morning and at bedtime. 360 tablet 3   Omega-3 Fatty Acids (FISH OIL) 1000 MG CAPS Take 3 capsules (3,000 mg total) by mouth 2 (two) times daily. 180 capsule 11   POTASSIUM GLUCONATE PO Take 595 mg by mouth 2 (two) times daily.     No current facility-administered medications for this visit.    Allergies:   Patient has no known allergies.    Social History:  The patient  reports that he has never smoked. He has never used smokeless tobacco. He reports current alcohol use of about 1.0 Love drink per week. He reports that he does not use drugs.   Family History:  The patient's family history includes Cancer in his father; Colon cancer in his mother; Diabetes in his maternal uncle; Heart attack (age of onset: 47) in his maternal grandfather; Heart disease in his mother;  Hypertension in his mother; Kidney disease in his maternal aunt.    ROS:  Please see the history of present illness.   Otherwise, review of systems are positive for DOE- he thinks worse since anemic.   All other systems are reviewed and negative.    PHYSICAL EXAM: VS:  BP 138/70    Pulse 83    Ht 5' 10.5" (1.791 m)    Wt 219 lb (99.3 kg)    SpO2 97%    BMI 30.98 kg/m  , BMI Body mass index is 30.98 kg/m. GEN: Well nourished, well developed, in no acute distress HEENT: normal Neck: no JVD, carotid bruits, or masses Cardiac: RRR; no murmurs, rubs, or gallops,no edema  Respiratory:  clear to auscultation bilaterally, normal work of breathing GI: soft, nontender, nondistended, + BS MS: no deformity or atrophy Skin: warm and dry, no rash Neuro:  Strength and sensation are intact Psych: euthymic mood, full affect   EKG:   The ekg ordered today demonstrates NSR, inferolateral TWI   Recent Labs: 07/04/2021: ALT 15; BUN 13; Creatinine, Ser 1.03; Hemoglobin 10.6; Platelets 186.0; Potassium 4.2; Sodium 141   Lipid Panel    Component Value Date/Time   CHOL 87 07/04/2021 1154   CHOL 91 (L) 06/24/2020 0930   TRIG 152.0 (H) 07/04/2021 1154   HDL 31.60 (L) 07/04/2021 1154   HDL 29 (L) 06/24/2020 0930   CHOLHDL 3 07/04/2021 1154   VLDL 30.4 07/04/2021 1154   LDLCALC 25 07/04/2021 1154   LDLCALC 27 06/24/2020 0930   LDLDIRECT 51 06/21/2017 1036     Other studies Reviewed: Additional studies/ records that were reviewed today with results demonstrating: Labs reviewed.   ASSESSMENT AND PLAN:  CAD: Continue aggressive secondary prevention.  No angina on medical therapy.  Hyperlipidemia: Whole food, plant-based diet recommended.  Increase fiber intake.  Normal LFTs in December 2022. LDL 25 in December 2022.  TG decreased in 2022.  Abnormal ECG: Chronic inferolateral TWI.  HTN: Low-salt diet.  Avoid processed foods. DM: Regular exercise to the target below.  Increase high-fiber foods to  help lower blood sugar. OK for EGD/colonoscopy.\ from a cardiac standpoint.   Current medicines are reviewed at length with the patient today.  The patient concerns regarding his medicines were addressed.  The following changes have been made:  No change  Labs/ tests ordered today include:  No orders of the defined types were placed in this encounter.   Recommend 150 minutes/week of aerobic exercise Low fat, low carb, high fiber diet recommended  Disposition:   FU in 1 year  Signed, Larae Grooms, MD  08/22/2021 3:49 PM    [facility] [address], [place],   [address] Phone: [phone]; Fax: [phone]

## 2021-08-22 ENCOUNTER — Other Ambulatory Visit: Payer: Self-pay

## 2021-08-22 ENCOUNTER — Encounter: Payer: Self-pay | Admitting: Interventional Cardiology

## 2021-08-22 ENCOUNTER — Ambulatory Visit: Payer: Medicare PPO | Admitting: Interventional Cardiology

## 2021-08-22 ENCOUNTER — Encounter: Payer: Self-pay | Admitting: Gastroenterology

## 2021-08-22 VITALS — BP 138/70 | HR 83 | Ht 70.5 in | Wt 219.0 lb

## 2021-08-22 DIAGNOSIS — R9431 Abnormal electrocardiogram [ECG] [EKG]: Secondary | ICD-10-CM

## 2021-08-22 DIAGNOSIS — E1165 Type 2 diabetes mellitus with hyperglycemia: Secondary | ICD-10-CM

## 2021-08-22 DIAGNOSIS — E782 Mixed hyperlipidemia: Secondary | ICD-10-CM | POA: Diagnosis not present

## 2021-08-22 DIAGNOSIS — I25118 Atherosclerotic heart disease of native coronary artery with other forms of angina pectoris: Secondary | ICD-10-CM

## 2021-08-22 DIAGNOSIS — I10 Essential (primary) hypertension: Secondary | ICD-10-CM

## 2021-08-22 NOTE — Patient Instructions (Signed)
Medication Instructions:  Your physician recommends that you continue on your current medications as directed. Please refer to the Current Medication list given to you today.  *If you need a refill on your cardiac medications before your next appointment, please call your pharmacy*   Lab Work: None  If you have labs (blood work) drawn today and your tests are completely normal, you will receive your results only by: [place] (if you have MyChart) OR A paper copy in the mail If you have any lab test that is abnormal or we need to change your treatment, we will call you to review the results.   Testing/Procedures: None  Follow-Up: At [facility], you and your health needs are our priority.  As part of our continuing mission to provide you with exceptional heart care, we have created designated Provider Care Teams.  These Care Teams include your primary Cardiologist (physician) and Advanced Practice Providers (APPs -  Physician Assistants and Nurse Practitioners) who all work together to provide you with the care you need, when you need it.  We recommend signing up for the patient portal called "MyChart".  Sign up information is provided on this After Visit Summary.  MyChart is used to connect with patients for Virtual Visits (Telemedicine).  Patients are able to view lab/test results, encounter notes, upcoming appointments, etc.  Non-urgent messages can be sent to your provider as well.   To learn more about what you can do with MyChart, go to [email or web address].    Your next appointment:   12 month(s)  The format for your next appointment:   In Person  Provider:   Larae Grooms, MD     Other Instructions None

## 2021-09-08 ENCOUNTER — Other Ambulatory Visit: Payer: Self-pay | Admitting: Internal Medicine

## 2021-09-08 DIAGNOSIS — E1165 Type 2 diabetes mellitus with hyperglycemia: Secondary | ICD-10-CM

## 2021-09-08 DIAGNOSIS — E1159 Type 2 diabetes mellitus with other circulatory complications: Secondary | ICD-10-CM

## 2021-09-12 ENCOUNTER — Ambulatory Visit: Payer: Medicare PPO | Admitting: Internal Medicine

## 2021-09-13 ENCOUNTER — Encounter: Payer: Self-pay | Admitting: Internal Medicine

## 2021-09-19 ENCOUNTER — Other Ambulatory Visit: Payer: Self-pay

## 2021-09-19 ENCOUNTER — Encounter: Payer: Self-pay | Admitting: Internal Medicine

## 2021-09-19 ENCOUNTER — Ambulatory Visit: Payer: Medicare PPO | Admitting: Internal Medicine

## 2021-09-19 VITALS — BP 120/78 | HR 76 | Ht 70.5 in | Wt 212.8 lb

## 2021-09-19 DIAGNOSIS — R946 Abnormal results of thyroid function studies: Secondary | ICD-10-CM | POA: Diagnosis not present

## 2021-09-19 DIAGNOSIS — E1159 Type 2 diabetes mellitus with other circulatory complications: Secondary | ICD-10-CM | POA: Diagnosis not present

## 2021-09-19 DIAGNOSIS — E785 Hyperlipidemia, unspecified: Secondary | ICD-10-CM

## 2021-09-19 DIAGNOSIS — E1165 Type 2 diabetes mellitus with hyperglycemia: Secondary | ICD-10-CM

## 2021-09-19 DIAGNOSIS — R7989 Other specified abnormal findings of blood chemistry: Secondary | ICD-10-CM

## 2021-09-19 LAB — POCT GLYCOSYLATED HEMOGLOBIN (HGB A1C): Hemoglobin A1C: 7.1 % — AB (ref 4.0–5.6)

## 2021-09-19 MED ORDER — OZEMPIC (0.25 OR 0.5 MG/DOSE) 2 MG/1.5ML ~~LOC~~ SOPN: 0.5000 mg | PEN_INJECTOR | SUBCUTANEOUS | 3 refills | Status: DC

## 2021-09-19 NOTE — Progress Notes (Addendum)
Patient ID: [name] Love, male   DOB: Dec 10, 1955, 66 y.o.   MRN: [id number]   This visit occurred during the SARS-CoV-2 public health emergency.  Safety protocols were in place, including screening questions prior to the visit, additional usage of staff PPE, and extensive cleaning of exam room while observing appropriate contact time as indicated for disinfecting solutions.   HPI: [name] Love is a 66 y.o.-year-old male, initially referred by his PCP, Dr. Mitchel Honour, returning for follow-up for DM2, dx in ~2008, non-insulin-dependent, uncontrolled, with long term complications (CAD - s/p CABG in 2007, CKD, PN, DR).  Last visit 4 months ago.  Interim history: No increased urination, blurry vision, nausea, chest pain.  He had a recent flu-like episode with gastroenteritis. Since last OV, he had 2 steroid injections - in hip and spine.  Reviewing HbA1c levels: Lab Results  Component Value Date   HGBA1C 7.1 (A) 05/16/2021   HGBA1C 7.3 (A) 01/03/2021   HGBA1C 6.4 (A) 09/14/2020   HGBA1C 6.9 (A) 05/12/2020   HGBA1C 6.9 (A) 12/02/2019   HGBA1C 6.9 (A) 08/04/2019   HGBA1C 7.3 (H) 06/10/2019   HGBA1C 6.8 (A) 04/02/2019   HGBA1C 7.0 (A) 11/05/2018   HGBA1C 6.5 (A) 07/10/2018   HGBA1C 6.8 (A) 05/27/2018   HGBA1C 6.9 (A) 03/21/2018   HGBA1C 6.4 11/06/2017   HGBA1C 8.2 06/21/2017   HGBA1C 9.5 03/21/2017  He got a steroid inj in shoulder >> 05/10/2017.  Pt is on a regimen of: - Metformin 1000 mg 2x a day, with meals >> ER formulation - still some diarrhea - Farxiga 10 mg before b'fast - Trulicity 1.5 mg weekly-started 05/2017 >> 3 mg weekly-tolerated well  We stopped glipizide in 11/2019.  He checks his sugars more than 4 times a day with his freestyle libre CGM:   Previously:   Previously:   Lowest sugar was 66 (Libre) >> 80 >> 70 >> 90; it is unclear at which level he has hypoglycemia awareness. Highest sugar was 365 (erroneous?) ...>> 200s >> 215 >>  298.  Pt's meals are: - Breakfast: bagel, OJ >> cereals >> occas. Cereals, or eggs  >> stopped OJ - Lunch: soup or sandwich or leftovers - Dinner: meat, veggies - Snacks: not daily  -+ Mild CKD, last BUN/creatinine:  Lab Results  Component Value Date   BUN 13 07/04/2021   BUN 16 06/24/2020   CREATININE 1.03 07/04/2021   CREATININE 1.05 06/24/2020  On losartan 50.  -+ HL; last set of lipids: Lab Results  Component Value Date   CHOL 87 07/04/2021   HDL 31.60 (L) 07/04/2021   LDLCALC 25 07/04/2021   LDLDIRECT 51 06/21/2017   TRIG 152.0 (H) 07/04/2021   CHOLHDL 3 07/04/2021  On Lipitor, omega-3 fatty acids 3000 mg 2x a day.  - last eye exam was in 01/2021: + DR.  Dr. Alois Cliche.  -+ Numbness and tingling in his feet.  On ASA 81.  He has a history of a slightly elevated TSH: Lab Results  Component Value Date   TSH 5.170 (H) 06/24/2020   TSH 2.918 05/22/2013   TSH 1.843 09/12/2012   ROS: + See HPI Neurological: no tremors/+ numbness/+ tingling/no dizziness  I reviewed pt's medications, allergies, PMH, social hx, family hx, and changes were documented in the history of present illness. Otherwise, unchanged from my initial visit note.  Past Medical History:  Diagnosis Date   Anemia    Arthritis    Coronary artery disease    post CABG x3  in 2007   Diabetes mellitus    Hyperlipidemia    Hypertension    Kidney stone    Obesity    Past Surgical History:  Procedure Laterality Date   CARDIAC CATHETERIZATION  EF= 45-50%   EF= 45-50% -- Three-vessel coronary artery disease with high-grade lesions in the proximal left anterior descending artery and multiple lesions in the right coronary -- Low normal ejection fraction -- Stable exertional angina -- [name], M.D. [facility]   CORONARY ARTERY BYPASS GRAFT  02/19/2006     x3 (left internal mammary artery to LAD, left radial artery to posterior descending, saphenous vein graft to first diagonal), endoscopic vein harvest  right leg -- SURGEON:  [name]. [name], M.D.   HERNIA REPAIR     at 66 y/o   tripple bypass     Social History   Socioeconomic History   Marital status: Married    Spouse name: Not on file   Number of children: 3  Social Needs  Occupational History   unemployed  Tobacco Use   Smoking status: Never Smoker   Smokeless tobacco: Never Used  Substance and Sexual Activity   Alcohol use: Yes    Alcohol/week:     Types: 1-2 per mo   Drug use: No      Other Topics      Social History Narrative      Current Outpatient Medications on File Prior to Visit  Medication Sig Dispense Refill   aspirin EC 81 MG tablet Take 1 tablet (81 mg total) by mouth daily. 90 tablet 3   atorvastatin (LIPITOR) 40 MG tablet Take 1 tablet (40 mg total) by mouth daily. 90 tablet 3   Continuous Blood Gluc Sensor (FREESTYLE LIBRE 14 DAY SENSOR) MISC USE SENSOR EVERY 14 DAYS. CHANGE SENSOR EVERY 2 WEEKS AS DIRECTED 6 each 0   dapagliflozin propanediol (FARXIGA) 10 MG TABS tablet Take 1 tablet (10 mg total) by mouth daily. 90 tablet 3   Dulaglutide (TRULICITY) 3 WT/8.8EK SOPN INJECT 3MG  SUBCUTANEOUSLY  ONCE A WEEK 12 mL 3   losartan (COZAAR) 50 MG tablet Take 1 tablet (50 mg total) by mouth daily. 90 tablet 3   Magnesium Glycinate 665 MG CAPS Take 1 tablet by mouth in the morning and at bedtime.     metFORMIN (GLUCOPHAGE-XR) 500 MG 24 hr tablet Take 2 tablets (1,000 mg total) by mouth in the morning and at bedtime. 360 tablet 3   Omega-3 Fatty Acids (FISH OIL) 1000 MG CAPS Take 3 capsules (3,000 mg total) by mouth 2 (two) times daily. 180 capsule 11   POTASSIUM GLUCONATE PO Take 595 mg by mouth 2 (two) times daily.     No current facility-administered medications on file prior to visit.   No Known Allergies Family History  Problem Relation Age of Onset   Heart disease Mother    Colon cancer Mother        77   Hypertension Mother    Cancer Father        type unknown   Heart attack Maternal  Grandfather 53   Diabetes Maternal Uncle    Kidney disease Maternal Aunt     PE: BP 120/78 (BP Location: Right Arm, Patient Position: Sitting, Cuff Size: Normal)    Pulse 76    Ht 5' 10.5" (1.791 m)    Wt 212 lb 12.8 oz (96.5 kg)    SpO2 98%    BMI 30.10 kg/m  Wt Readings from Last 3 Encounters:  09/19/21 212 lb 12.8 oz (96.5 kg)  08/22/21 219 lb (99.3 kg)  08/11/21 215 lb 2 oz (97.6 kg)   Constitutional: overweight, in NAD Eyes: PERRLA, EOMI, no exophthalmos ENT: moist mucous membranes, no thyromegaly, no cervical lymphadenopathy Cardiovascular: RRR, No MRG Respiratory: CTA B Musculoskeletal: no deformities, strength intact in all 4 Skin: moist, warm, no rashes Neurological: no tremor with outstretched hands, DTR normal in all 4 Diabetic Foot Exam - Simple   Simple Foot Form Diabetic Foot exam was performed with the following findings: Yes 09/19/2021  2:26 PM  Visual Inspection See comments: Yes Sensation Testing Intact to touch and monofilament testing bilaterally: Yes Pulse Check Posterior Tibialis and Dorsalis pulse intact bilaterally: Yes Comments Eczematous changes bilateral feet, with violaceous discoloration on the soles.  Onychodystrophic toenails.    ASSESSMENT: 1. DM2, non-insulin-dependent, uncontrolled, without complications - CAD - s/p CABG in 2007 - CKD - PN - DR  2. HL  3. Obesity class I  PLAN:  1. Patient with longstanding, uncontrolled, type 2 diabetes, on metformin, SGLT2 inhibitor and also weekly GLP-1 receptor agonist, with overall good control.  At last visit, HbA1c was 7.1%, decreased from 7.3%.  At that time, reviewing his CGM patterns, sugars were well controlled throughout the day and night but he did have increased blood sugars around 2 AM, most likely due to snacks at night.  Sugars were not increasing above 180, however.  At that time, I advised him against snacking at night but we did not change his regimen.  He did mention at that time that  his diarrhea was much improved after switching from regular metformin to metformin ER. CGM interpretation: -At today's visit, we reviewed his CGM downloads: It appears that 74% of values are in target range (goal >70%), while 26% are higher than 180 (goal <25%), and 0% are lower than 70 (goal <4%).  The calculated average blood sugar is 160.  HbA1c predicted from the last 2 weeks: 7.1% -Reviewing the CGM trends, it does appear that his sugars are higher after breakfast and lunch and also after dinner.  They are improving overnight.  He does mention that he saw higher blood sugars after his steroid injections and all so after he was sick recently.  For now, my suggestion was to change from Trulicity to [place].  We will first start with 0.5 mg weekly, then increase slowly to 1 mg weekly.  I sent the prescription for this to his pharmacy. - I suggested to:  Patient Instructions  Please continue:  - Metformin ER 1000 mg 2x a day, with meals - Farxiga 10 mg before b'fast  Please change Trulicity: - Ozempic 0.5 mg weekly x2 doses, then 1 mg weekly for 1 dose, then let me know to send the 1 mg dose pen  Stop Farxiga 3 days prior to your colonoscopy.  Please return in 4 months.  - we checked his HbA1c: 7.1% (stable) - advised to check sugars at different times of the day - 4x a day, rotating check times - advised for yearly eye exams >> he is UTD - foot exam performed today-he has evidence of fungal infection.  He does have a cream given by dermatology, which helps.  He has not seen dermatology for the last 2 years and I advised him to establish care with them to hopefully eradicate the infection.  His nails are also dystrophic, possibly due to the fungal infection. - return to clinic in 4 months  2.  Hyperlipidemia -  Reviewed latest lipid panel from 06/2021: LDL excellent, at goal, HDL slightly low: Lab Results  Component Value Date   CHOL 87 07/04/2021   HDL 31.60 (L) 07/04/2021   LDLCALC 25  07/04/2021   LDLDIRECT 51 06/21/2017   TRIG 152.0 (H) 07/04/2021   CHOLHDL 3 07/04/2021  -He continues on Lipitor 40 mg daily, omega-3 fatty acids 3 g twice a day without side effects  3.  Obesity class I -continue SGLT 2 inhibitor and GLP-1 receptor agonist which should also help with weight loss -He lost 11 pounds before the last visit combined -Since last visit, he gained 1 pound.  Component     Latest Ref Rng & Units 09/19/2021  TSH     0.35 - 5.50 uIU/mL 1.67  T4,Free(Direct)     0.60 - 1.60 ng/dL 0.99  Triiodothyronine,Free,Serum     2.3 - 4.2 pg/mL 3.6  TFTs now all normal.  Philemon Kingdom, MD PhD [facility]

## 2021-09-19 NOTE — Patient Instructions (Addendum)
Please continue:  - Metformin ER 1000 mg 2x a day, with meals - Farxiga 10 mg before b'fast  Please change Trulicity: - Ozempic 0.5 mg weekly x2 doses, then 1 mg weekly for 1 dose, then let me know to send the 1 mg dose pen  Stop Farxiga 3 days prior to your colonoscopy.  Please return in 4 months.

## 2021-09-20 LAB — TSH: TSH: 1.67 u[IU]/mL (ref 0.35–5.50)

## 2021-09-20 LAB — T3, FREE: T3, Free: 3.6 pg/mL (ref 2.3–4.2)

## 2021-09-20 LAB — T4, FREE: Free T4: 0.99 ng/dL (ref 0.60–1.60)

## 2021-09-23 ENCOUNTER — Other Ambulatory Visit: Payer: Self-pay | Admitting: Internal Medicine

## 2021-09-26 ENCOUNTER — Ambulatory Visit (AMBULATORY_SURGERY_CENTER): Payer: Medicare PPO | Admitting: Gastroenterology

## 2021-09-26 ENCOUNTER — Encounter: Payer: Self-pay | Admitting: Gastroenterology

## 2021-09-26 VITALS — BP 142/87 | HR 68 | Temp 97.8°F | Resp 15 | Ht 70.0 in | Wt 215.0 lb

## 2021-09-26 DIAGNOSIS — K639 Disease of intestine, unspecified: Secondary | ICD-10-CM

## 2021-09-26 DIAGNOSIS — K2950 Unspecified chronic gastritis without bleeding: Secondary | ICD-10-CM | POA: Diagnosis not present

## 2021-09-26 DIAGNOSIS — D509 Iron deficiency anemia, unspecified: Secondary | ICD-10-CM

## 2021-09-26 DIAGNOSIS — I10 Essential (primary) hypertension: Secondary | ICD-10-CM | POA: Diagnosis not present

## 2021-09-26 DIAGNOSIS — E119 Type 2 diabetes mellitus without complications: Secondary | ICD-10-CM | POA: Diagnosis not present

## 2021-09-26 DIAGNOSIS — Z1211 Encounter for screening for malignant neoplasm of colon: Secondary | ICD-10-CM

## 2021-09-26 DIAGNOSIS — I2510 Atherosclerotic heart disease of native coronary artery without angina pectoris: Secondary | ICD-10-CM | POA: Diagnosis not present

## 2021-09-26 DIAGNOSIS — K2970 Gastritis, unspecified, without bleeding: Secondary | ICD-10-CM | POA: Diagnosis not present

## 2021-09-26 DIAGNOSIS — K259 Gastric ulcer, unspecified as acute or chronic, without hemorrhage or perforation: Secondary | ICD-10-CM | POA: Diagnosis not present

## 2021-09-26 HISTORY — PX: UPPER GASTROINTESTINAL ENDOSCOPY: SHX188

## 2021-09-26 HISTORY — PX: COLONOSCOPY: SHX174

## 2021-09-26 MED ORDER — OMEPRAZOLE 20 MG PO CPDR: 20.0000 mg | DELAYED_RELEASE_CAPSULE | Freq: Two times a day (BID) | ORAL | 2 refills | Status: DC

## 2021-09-26 MED ORDER — SODIUM CHLORIDE 0.9 % IV SOLN: 500.0000 mL | Freq: Once | INTRAVENOUS | Status: DC

## 2021-09-26 NOTE — Progress Notes (Signed)
Pt's states no medical or surgical changes since previsit or office visit.  VS CW  

## 2021-09-26 NOTE — Progress Notes (Signed)
Called to room to assist during endoscopic procedure.  Patient ID and intended procedure confirmed with present staff. Received instructions for my participation in the procedure from the performing physician.  

## 2021-09-26 NOTE — Progress Notes (Signed)
[facility] History and Physical ? ? ?Primary Care Physician:  [name], MD ? ? ?Reason for Procedure:   Iron deficiency anemia ? ?Plan:    EGD, colonoscopy ? ? ? ? ?HPI: [name] Love is a 66 y.o. male undergoing EGD and colonoscopy to evaluate new iron deficiency anemia.  He has no chronic GI symptoms.  His mother was diagnosed with colon cancer at age 1.   ? ? ?Past Medical History:  ?Diagnosis Date  ? Anemia   ? Arthritis   ? Coronary artery disease   ? post CABG x3 in 2007  ? Diabetes mellitus   ? Hyperlipidemia   ? Hypertension   ? Kidney stone 1990  ? Obesity   ? ? ?Past Surgical History:  ?Procedure Laterality Date  ? CARDIAC CATHETERIZATION  EF= 45-50%  ? EF= 45-50% -- Three-vessel coronary artery disease with high-grade lesions in the proximal left anterior descending artery and multiple lesions in the right coronary -- Low normal ejection fraction -- Stable exertional angina -- [name], M.D. [facility]  ? COLONOSCOPY  09/26/2021  ? CORONARY ARTERY BYPASS GRAFT  02/19/2006  ?  x3 (left internal mammary artery to LAD, left radial artery to posterior descending, saphenous vein graft to first diagonal), endoscopic vein harvest right leg -- SURGEON:  [name]. [name], M.D.  ? HERNIA REPAIR    ? at 66 y/o  ? SHOULDER ARTHROSCOPY Right 2007  ? tripple bypass    ? UPPER GASTROINTESTINAL ENDOSCOPY    ? ? ?Prior to Admission medications   ?Medication Sig Start Date End Date Taking? Authorizing Provider  ?aspirin EC 81 MG tablet Take 1 tablet (81 mg total) by mouth daily. 07/30/18  Yes Imogene Burn, PA-C  ?atorvastatin (LIPITOR) 40 MG tablet Take 1 tablet (40 mg total) by mouth daily. 08/23/20  Yes [name], MD  ?brompheniramine-pseudoephedrine-dextromethorphan (DIMETAPP DM) 15-1-5 MG/5ML ELIX Take by mouth every 6 (six) hours as needed.   Yes [provider]  ?Continuous Blood Gluc Sensor (FREESTYLE LIBRE 14 DAY SENSOR) MISC USE SENSOR EVERY 14 DAYS.  CHANGE SENSOR EVERY 2 WEEKS AS DIRECTED 09/08/21  Yes Philemon Kingdom, MD  ?[place] 10 MG TABS tablet Take 1 tablet by mouth once daily 09/23/21  Yes Philemon Kingdom, MD  ?losartan (COZAAR) 50 MG tablet Take 1 tablet (50 mg total) by mouth daily. 07/04/21  Yes SagardiaInes Bloomer, MD  ?Magnesium Glycinate 665 MG CAPS Take 1 tablet by mouth in the morning and at bedtime.   Yes [provider]  ?metFORMIN (GLUCOPHAGE-XR) 500 MG 24 hr tablet Take 2 tablets (1,000 mg total) by mouth in the morning and at bedtime. 01/03/21  Yes Philemon Kingdom, MD  ?naproxen sodium (ALEVE) 220 MG tablet Take 220 mg by mouth daily as needed.   Yes [provider]  ?Omega-3 Fatty Acids (FISH OIL) 1000 MG CAPS Take 3 capsules (3,000 mg total) by mouth 2 (two) times daily. 08/19/19  Yes [name], MD  ?POTASSIUM GLUCONATE PO Take 595 mg by mouth 2 (two) times daily.   Yes [provider]  ?Pseudoeph-Doxylamine-DM-APAP (NYQUIL PO) Take by mouth.   Yes [provider]  ?Semaglutide,0.25 or 0.'5MG'$ /DOS, (OZEMPIC, 0.25 OR 0.5 MG/DOSE,) 2 MG/1.5ML SOPN Inject 0.5 mg into the skin once a week. 09/19/21  Yes Philemon Kingdom, MD  ?TURMERIC CURCUMIN PO Take by mouth.   Yes [provider]  ? ? ?Current Outpatient Medications  ?Medication Sig Dispense Refill  ? aspirin  EC 81 MG tablet Take 1 tablet (81 mg total) by mouth daily. 90 tablet 3  ? atorvastatin (LIPITOR) 40 MG tablet Take 1 tablet (40 mg total) by mouth daily. 90 tablet 3  ? brompheniramine-pseudoephedrine-dextromethorphan (DIMETAPP DM) 15-1-5 MG/5ML ELIX Take by mouth every 6 (six) hours as needed.    ? Continuous Blood Gluc Sensor (FREESTYLE LIBRE 14 DAY SENSOR) MISC USE SENSOR EVERY 14 DAYS. CHANGE SENSOR EVERY 2 WEEKS AS DIRECTED 6 each 0  ? FARXIGA 10 MG TABS tablet Take 1 tablet by mouth once daily 90 tablet 3  ? losartan (COZAAR) 50 MG tablet Take 1 tablet (50 mg total) by mouth daily. 90 tablet 3  ? Magnesium Glycinate 665 MG  CAPS Take 1 tablet by mouth in the morning and at bedtime.    ? metFORMIN (GLUCOPHAGE-XR) 500 MG 24 hr tablet Take 2 tablets (1,000 mg total) by mouth in the morning and at bedtime. 360 tablet 3  ? naproxen sodium (ALEVE) 220 MG tablet Take 220 mg by mouth daily as needed.    ? Omega-3 Fatty Acids (FISH OIL) 1000 MG CAPS Take 3 capsules (3,000 mg total) by mouth 2 (two) times daily. 180 capsule 11  ? POTASSIUM GLUCONATE PO Take 595 mg by mouth 2 (two) times daily.    ? Pseudoeph-Doxylamine-DM-APAP (NYQUIL PO) Take by mouth.    ? Semaglutide,0.25 or 0.'5MG'$ /DOS, (OZEMPIC, 0.25 OR 0.5 MG/DOSE,) 2 MG/1.5ML SOPN Inject 0.5 mg into the skin once a week. 1.5 mL 3  ? TURMERIC CURCUMIN PO Take by mouth.    ? ?No current facility-administered medications for this visit.  ? ? ?Allergies as of 09/26/2021  ? (No Known Allergies)  ? ? ?Family History  ?Problem Relation Age of Onset  ? Heart disease Mother   ? Colon cancer Mother   ?     58  ? Hypertension Mother   ? Cancer Father   ?     type unknown  ? Kidney disease Maternal Aunt   ? Diabetes Maternal Uncle   ? Heart attack Maternal Grandfather 52  ? Colon polyps Neg Hx   ? Esophageal cancer Neg Hx   ? Rectal cancer Neg Hx   ? Stomach cancer Neg Hx   ? ? ?Social History  ? ?Socioeconomic History  ? Marital status: Married  ?  Spouse name: Not on file  ? Number of children: 3  ? Years of education: Not on file  ? Highest education level: Not on file  ?Occupational History  ? Occupation: retired  ?Tobacco Use  ? Smoking status: Never  ? Smokeless tobacco: Never  ?Vaping Use  ? Vaping Use: Never used  ?Substance and Sexual Activity  ? Alcohol use: Yes  ?  Alcohol/week: 1.0 standard drink  ?  Types: 1 Standard drinks or equivalent per week  ?  Comment: 1 drink monthly  ? Drug use: Never  ? Sexual activity: Yes  ?Other Topics Concern  ? Not on file  ?Social History Narrative  ? Not on file  ? ?Social Determinants of Health  ? ?Financial Resource Strain: Not on file  ?Food Insecurity:  Not on file  ?Transportation Needs: Not on file  ?Physical Activity: Not on file  ?Stress: Not on file  ?Social Connections: Not on file  ?Intimate Partner Violence: Not on file  ? ? ?Review of Systems: ? ?All other review of systems negative except as mentioned in the HPI. ? ?Physical Exam: ?Vital signs ?BP 136/88  Pulse 80   Temp 97.8 ?F (36.6 ?C) (Temporal)   Ht '5\' 10"'$  (1.778 m)   Wt 215 lb (97.5 kg)   SpO2 99%   BMI 30.85 kg/m?  ? ?General:   Alert,  Well-developed, well-nourished, pleasant and cooperative in NAD ?Airway:  Mallampati 2 ?Lungs:  Clear throughout to auscultation.   ?Heart:  Regular rate and rhythm; no murmurs, clicks, rubs,  or gallops. ?Abdomen:  Soft, nontender and nondistended. Normal bowel sounds.   ?Neuro/Psych:  Normal mood and affect. A and O x 3 ? ? ?Jaquilla Woodroof E. Candis Schatz, MD ?[facility] ? ?

## 2021-09-26 NOTE — Patient Instructions (Addendum)
Handout on gastritis provided  ? ?Await pathology results.  ? ?Continue current medications.  ? ?Use Prilosec (omeprazole) 20 mg PO BID for 8 weeks. ? ?Repeat upper endoscopy in 8 weeks to check healing. Repeat colonoscopy in 10 years for screening purposes. ? ?Avoid ibuprofen, naproxen, or other non-steroidal anti-inflammatory drugs. Use Tylenol preferentially for pain relief ? ?YOU HAD AN ENDOSCOPIC PROCEDURE TODAY AT [facility]:   Refer to the procedure report that was given to you for any specific questions about what was found during the examination.  If the procedure report does not answer your questions, please call your gastroenterologist to clarify.  If you requested that your care partner not be given the details of your procedure findings, then the procedure report has been included in a sealed envelope for you to review at your convenience later. ? ?YOU SHOULD EXPECT: Some feelings of bloating in the abdomen. Passage of more gas than usual.  Walking can help get rid of the air that was put into your GI tract during the procedure and reduce the bloating. If you had a lower endoscopy (such as a colonoscopy or flexible sigmoidoscopy) you may notice spotting of blood in your stool or on the toilet paper. If you underwent a bowel prep for your procedure, you may not have a normal bowel movement for a few days. ? ?Please Note:  You might notice some irritation and congestion in your nose or some drainage.  This is from the oxygen used during your procedure.  There is no need for concern and it should clear up in a day or so. ? ?SYMPTOMS TO REPORT IMMEDIATELY: ? ?Following lower endoscopy (colonoscopy or flexible sigmoidoscopy): ? Excessive amounts of blood in the stool ? Significant tenderness or worsening of abdominal pains ? Swelling of the abdomen that is new, acute ? Fever of 100?F or higher ? ?Following upper endoscopy (EGD) ? Vomiting of blood or coffee ground material ? New chest pain  or pain under the shoulder blades ? Painful or persistently difficult swallowing ? New shortness of breath ? Fever of 100?F or higher ? Black, tarry-looking stools ? ?For urgent or emergent issues, a gastroenterologist can be reached at any hour by calling [phone]. ?Do not use MyChart messaging for urgent concerns.  ? ? ?DIET:  We do recommend a small meal at first, but then you may proceed to your regular diet.  Drink plenty of fluids but you should avoid alcoholic beverages for 24 hours. ? ?ACTIVITY:  You should plan to take it easy for the rest of today and you should NOT DRIVE or use heavy machinery until tomorrow (because of the sedation medicines used during the test).   ? ?FOLLOW UP: ?Our staff will call the number listed on your records 48-72 hours following your procedure to check on you and address any questions or concerns that you may have regarding the information given to you following your procedure. If we do not reach you, we will leave a message.  We will attempt to reach you two times.  During this call, we will ask if you have developed any symptoms of COVID 19. If you develop any symptoms (ie: fever, flu-like symptoms, shortness of breath, cough etc.) before then, please call [phone].  If you test positive for Covid 19 in the 2 weeks post procedure, please call and report this information to [place].   ? ?If any biopsies were taken you will be contacted by phone or by  letter within the next 1-3 weeks.  Please call us at [phone] if you have not heard about the biopsies in 3 weeks.  ? ? ?SIGNATURES/CONFIDENTIALITY: ?You and/or your care partner have signed paperwork which will be entered into your electronic medical record.  These signatures attest to the fact that that the information above on your After Visit Summary has been reviewed and is understood.  Full responsibility of the confidentiality of this discharge information lies with you and/or your care-partner. ? ? ?

## 2021-09-26 NOTE — Op Note (Signed)
[place] ?Patient Name: [name] Love ?Procedure Date: 09/26/2021 10:32 AM ?MRN: [id number] ?Endoscopist: Aamna Mallozzi E. Candis Schatz , MD ?Age: 66 ?Referring MD:  ?Date of Birth: Mar 22, 1956 ?Gender: Male ?Account #: [id number] ?Procedure:                Upper GI endoscopy ?Indications:              Iron deficiency anemia ?Medicines:                Monitored Anesthesia Care ?Procedure:                Pre-Anesthesia Assessment: ?                          - Prior to the procedure, a History and Physical  ?                          was performed, and patient medications and  ?                          allergies were reviewed. The patient's tolerance of  ?                          previous anesthesia was also reviewed. The risks  ?                          and benefits of the procedure and the sedation  ?                          options and risks were discussed with the patient.  ?                          All questions were answered, and informed consent  ?                          was obtained. Prior Anticoagulants: The patient has  ?                          taken no previous anticoagulant or antiplatelet  ?                          agents except for aspirin. ASA Grade Assessment: II  ?                          - A patient with mild systemic disease. After  ?                          reviewing the risks and benefits, the patient was  ?                          deemed in satisfactory condition to undergo the  ?                          procedure. ?  After obtaining informed consent, the endoscope was  ?                          passed under direct vision. Throughout the  ?                          procedure, the patient's blood pressure, pulse, and  ?                          oxygen saturations were monitored continuously. The  ?                          GIF [id number] #[id number] was introduced through the  ?                          mouth, and advanced to the third part of duodenum.  ?                           The upper GI endoscopy was accomplished without  ?                          difficulty. The patient tolerated the procedure  ?                          well. ?Scope In: ?Scope Out: ?Findings:                 The examined portions of the nasopharynx,  ?                          oropharynx and larynx were normal. ?                          The examined esophagus was normal. ?                          Many non-bleeding superficial gastric ulcers with  ?                          no stigmata of bleeding were found in the gastric  ?                          antrum. The largest lesion was 3 mm in largest  ?                          dimension. Biopsies were taken with a cold forceps  ?                          for Helicobacter pylori testing. Estimated blood  ?                          loss was minimal. ?                          Patchy mildly erythematous mucosa without bleeding  ?  was found in the gastric fundus and in the gastric  ?                          antrum. Biopsies were taken with a cold forceps for  ?                          Helicobacter pylori testing. Estimated blood loss  ?                          was minimal. ?                          The exam of the stomach was otherwise normal. ?                          The examined duodenum was normal. ?Complications:            No immediate complications. ?Estimated Blood Loss:     Estimated blood loss was minimal. ?Impression:               - The examined portions of the nasopharynx,  ?                          oropharynx and larynx were normal. ?                          - Normal esophagus. ?                          - Non-bleeding gastric ulcers with no stigmata of  ?                          bleeding. Biopsied. Suspect these are  ?                          NSAID-related. This is a likely source of iron  ?                          deficiency anemia. ?                          - Erythematous mucosa in the gastric  fundus and  ?                          antrum. Biopsied. ?                          - Normal examined duodenum. ?Recommendation:           - Patient has a contact number available for  ?                          emergencies. The signs and symptoms of potential  ?                          delayed complications were discussed with the  ?  patient. Return to normal activities tomorrow.  ?                          Written discharge instructions were provided to the  ?                          patient. ?                          - Resume previous diet. ?                          - Continue present medications. ?                          - Await pathology results. ?                          - Use Prilosec (omeprazole) 20 mg PO BID for 8  ?                          weeks. ?                          - Repeat upper endoscopy in 8 weeks to check  ?                          healing. ?                          - Avoid ibuprofen, naproxen, or other non-steroidal  ?                          anti-inflammatory drugs. Use Tylenol preferentially  ?                          for pain relief ?Falan Hensler E. Candis Schatz, MD ?09/26/2021 11:16:20 AM ?This report has been signed electronically. ?

## 2021-09-26 NOTE — Addendum Note (Signed)
Addended by: Star Age D on: 09/26/2021 02:20 PM ? ? Modules accepted: Orders ? ?

## 2021-09-26 NOTE — Op Note (Signed)
[place] ?Patient Name: [name] Love ?Procedure Date: 09/26/2021 10:26 AM ?MRN: [id number] ?Endoscopist: Nadia Torr E. Candis Schatz , MD ?Age: 66 ?Referring MD:  ?Date of Birth: 1956/03/26 ?Gender: Male ?Account #: [id number] ?Procedure:                Colonoscopy ?Indications:              Iron deficiency anemia ?Medicines:                Monitored Anesthesia Care ?Procedure:                Pre-Anesthesia Assessment: ?                          - Prior to the procedure, a History and Physical  ?                          was performed, and patient medications and  ?                          allergies were reviewed. The patient's tolerance of  ?                          previous anesthesia was also reviewed. The risks  ?                          and benefits of the procedure and the sedation  ?                          options and risks were discussed with the patient.  ?                          All questions were answered, and informed consent  ?                          was obtained. Prior Anticoagulants: The patient has  ?                          taken no previous anticoagulant or antiplatelet  ?                          agents except for aspirin. ASA Grade Assessment: II  ?                          - A patient with mild systemic disease. After  ?                          reviewing the risks and benefits, the patient was  ?                          deemed in satisfactory condition to undergo the  ?                          procedure. ?  After obtaining informed consent, the colonoscope  ?                          was passed under direct vision. Throughout the  ?                          procedure, the patient's blood pressure, pulse, and  ?                          oxygen saturations were monitored continuously. The  ?                          CF [id number] #[id number] was introduced through the anus  ?                          and advanced to the the terminal ileum, with  ?                           identification of the appendiceal orifice and IC  ?                          valve. The colonoscopy was performed without  ?                          difficulty. The patient tolerated the procedure  ?                          well. The quality of the bowel preparation was  ?                          adequate. The terminal ileum, ileocecal valve,  ?                          appendiceal orifice, and rectum were photographed.  ?                          The bowel preparation used was Miralax via split  ?                          dose instruction. ?Scope In: 10:48:56 AM ?Scope Out: 11:05:34 AM ?Scope Withdrawal Time: 0 hours 12 minutes 42 seconds  ?Total Procedure Duration: 0 hours 16 minutes 38 seconds  ?Findings:                 The perianal and digital rectal examinations were  ?                          normal. Pertinent negatives include normal  ?                          sphincter tone and no palpable rectal lesions. ?                          A patchy area of mildly erythematous mucosa was  ?  found in the distal transverse colon. Biopsies were  ?                          taken with a cold forceps for histology. Estimated  ?                          blood loss was minimal. ?                          The exam was otherwise normal throughout the  ?                          examined colon. ?                          The terminal ileum appeared normal. ?                          The retroflexed view of the distal rectum and anal  ?                          verge was normal and showed no anal or rectal  ?                          abnormalities. ?Complications:            No immediate complications. ?Estimated Blood Loss:     Estimated blood loss was minimal. ?Impression:               - Patchy mild erythematous mucosa in the distal  ?                          transverse colon. Biopsied. ?                          - The examined portion of the ileum was normal. ?                           - The distal rectum and anal verge are normal on  ?                          retroflexion view. ?Recommendation:           - Patient has a contact number available for  ?                          emergencies. The signs and symptoms of potential  ?                          delayed complications were discussed with the  ?                          patient. Return to normal activities tomorrow.  ?                          Written discharge instructions were provided to the  ?  patient. ?                          - Resume previous diet. ?                          - Continue present medications. ?                          - Await pathology results. ?                          - Repeat colonoscopy in 10 years for screening  ?                          purposes. ?Jahsir Rama E. Candis Schatz, MD ?09/26/2021 11:23:20 AM ?This report has been signed electronically. ?

## 2021-09-26 NOTE — Progress Notes (Signed)
Report to PACU, RN, vss, BBS= Clear.  

## 2021-09-28 ENCOUNTER — Telehealth: Payer: Self-pay

## 2021-09-28 ENCOUNTER — Telehealth: Payer: Self-pay | Admitting: *Deleted

## 2021-09-28 NOTE — Telephone Encounter (Signed)
First follow up call attempt.  LVM. 

## 2021-09-28 NOTE — Telephone Encounter (Signed)
?  Follow up Call- ? ?Call back number 09/26/2021  ?Post procedure Call Back phone  # [phone]  ?Permission to leave phone message Yes  ?Some recent data might be hidden  ?  ? ?Patient questions: ? ?Do you have a fever, pain , or abdominal swelling? No. ?Pain Score  0 * ? ?Have you tolerated food without any problems? Yes.   ? ?Have you been able to return to your normal activities? Yes.   ? ?Do you have any questions about your discharge instructions: ?Diet   No. ?Medications  No. ?Follow up visit  No. ? ?Do you have questions or concerns about your Care? No. ? ?Actions: ?* If pain score is 4 or above: ?No action needed, pain <4. ? ? ?

## 2021-09-28 NOTE — Progress Notes (Signed)
[name] Love, ?The biopsies taken from your stomach were notable for mild chronic gastritis (inflammation) which is a common finding, but there was no evidence of Helicobacter pylori infection. The ulcers and inflammation seen were most likely from the pain medication (Aleve).  Please take the omeprazole as directed, avoid Aleve and other NSAIDs and repeat upper endoscopy in 6-8 weeks as discussed. ? ?The biopsies taken from your colon were completely normal, with no evidence of inflammation or dysplasia.   ?You should repeat colonoscopy in 10 years for colon cancer screening. ? ?

## 2021-10-03 ENCOUNTER — Ambulatory Visit: Payer: Medicare PPO | Admitting: Internal Medicine

## 2021-10-04 ENCOUNTER — Encounter: Payer: Self-pay | Admitting: Gastroenterology

## 2021-10-07 ENCOUNTER — Other Ambulatory Visit: Payer: Self-pay | Admitting: Interventional Cardiology

## 2021-10-07 ENCOUNTER — Encounter: Payer: Self-pay | Admitting: Internal Medicine

## 2021-10-07 DIAGNOSIS — E785 Hyperlipidemia, unspecified: Secondary | ICD-10-CM

## 2021-10-07 DIAGNOSIS — E1159 Type 2 diabetes mellitus with other circulatory complications: Secondary | ICD-10-CM

## 2021-10-07 DIAGNOSIS — E1165 Type 2 diabetes mellitus with hyperglycemia: Secondary | ICD-10-CM

## 2021-10-07 MED ORDER — OZEMPIC (1 MG/DOSE) 4 MG/3ML ~~LOC~~ SOPN: 1.0000 mg | PEN_INJECTOR | SUBCUTANEOUS | 1 refills | Status: DC

## 2021-10-17 ENCOUNTER — Telehealth: Payer: Self-pay

## 2021-10-17 NOTE — Telephone Encounter (Signed)
Inbound call from pt and [facility] requesting paperwork be completed and resubmitted. Patient order completed and submitted through online portal [employer]. ?

## 2021-10-26 ENCOUNTER — Other Ambulatory Visit: Payer: Self-pay

## 2021-10-26 ENCOUNTER — Encounter: Payer: Self-pay | Admitting: Gastroenterology

## 2021-10-26 DIAGNOSIS — E1159 Type 2 diabetes mellitus with other circulatory complications: Secondary | ICD-10-CM | POA: Diagnosis not present

## 2021-10-26 MED ORDER — OMEPRAZOLE 20 MG PO CPDR: 20.0000 mg | DELAYED_RELEASE_CAPSULE | Freq: Two times a day (BID) | ORAL | 3 refills | Status: DC

## 2021-11-21 ENCOUNTER — Encounter: Payer: Medicare PPO | Admitting: Gastroenterology

## 2021-12-01 ENCOUNTER — Encounter: Payer: Self-pay | Admitting: Gastroenterology

## 2021-12-06 DIAGNOSIS — M5416 Radiculopathy, lumbar region: Secondary | ICD-10-CM | POA: Diagnosis not present

## 2021-12-07 ENCOUNTER — Encounter: Payer: Self-pay | Admitting: Gastroenterology

## 2021-12-07 ENCOUNTER — Ambulatory Visit (AMBULATORY_SURGERY_CENTER): Payer: Medicare PPO | Admitting: Gastroenterology

## 2021-12-07 VITALS — BP 168/92 | HR 62 | Temp 98.4°F | Resp 19 | Ht 70.0 in | Wt 215.0 lb

## 2021-12-07 DIAGNOSIS — K259 Gastric ulcer, unspecified as acute or chronic, without hemorrhage or perforation: Secondary | ICD-10-CM | POA: Diagnosis not present

## 2021-12-07 DIAGNOSIS — D509 Iron deficiency anemia, unspecified: Secondary | ICD-10-CM | POA: Diagnosis not present

## 2021-12-07 DIAGNOSIS — T39395A Adverse effect of other nonsteroidal anti-inflammatory drugs [NSAID], initial encounter: Secondary | ICD-10-CM | POA: Diagnosis not present

## 2021-12-07 DIAGNOSIS — K319 Disease of stomach and duodenum, unspecified: Secondary | ICD-10-CM | POA: Diagnosis not present

## 2021-12-07 DIAGNOSIS — E119 Type 2 diabetes mellitus without complications: Secondary | ICD-10-CM | POA: Diagnosis not present

## 2021-12-07 DIAGNOSIS — E669 Obesity, unspecified: Secondary | ICD-10-CM | POA: Diagnosis not present

## 2021-12-07 DIAGNOSIS — I10 Essential (primary) hypertension: Secondary | ICD-10-CM | POA: Diagnosis not present

## 2021-12-07 MED ORDER — SODIUM CHLORIDE 0.9 % IV SOLN: 500.0000 mL | Freq: Once | INTRAVENOUS | Status: DC

## 2021-12-07 NOTE — Progress Notes (Signed)
Called to room to assist during endoscopic procedure.  Patient ID and intended procedure confirmed with present staff. Received instructions for my participation in the procedure from the performing physician.  

## 2021-12-07 NOTE — Patient Instructions (Signed)
No aspirin, ibuprofen, naproxen, or other non-steroidal anti-inflammatory drugs. ?Await pathology results. ? ?YOU HAD AN ENDOSCOPIC PROCEDURE TODAY AT [facility]:   Refer to the procedure report that was given to you for any specific questions about what was found during the examination.  If the procedure report does not answer your questions, please call your gastroenterologist to clarify.  If you requested that your care partner not be given the details of your procedure findings, then the procedure report has been included in a sealed envelope for you to review at your convenience later. ? ?YOU SHOULD EXPECT: Some feelings of bloating in the abdomen. Passage of more gas than usual.  Walking can help get rid of the air that was put into your GI tract during the procedure and reduce the bloating. If you had a lower endoscopy (such as a colonoscopy or flexible sigmoidoscopy) you may notice spotting of blood in your stool or on the toilet paper. If you underwent a bowel prep for your procedure, you may not have a normal bowel movement for a few days. ? ?Please Note:  You might notice some irritation and congestion in your nose or some drainage.  This is from the oxygen used during your procedure.  There is no need for concern and it should clear up in a day or so. ? ?SYMPTOMS TO REPORT IMMEDIATELY: ? ? ?Following upper endoscopy (EGD) ? Vomiting of blood or coffee ground material ? New chest pain or pain under the shoulder blades ? Painful or persistently difficult swallowing ? New shortness of breath ? Fever of 100?F or higher ? Black, tarry-looking stools ? ?For urgent or emergent issues, a gastroenterologist can be reached at any hour by calling [phone]. ?Do not use MyChart messaging for urgent concerns.  ? ? ?DIET:  We do recommend a small meal at first, but then you may proceed to your regular diet.  Drink plenty of fluids but you should avoid alcoholic beverages for 24  hours. ? ?ACTIVITY:  You should plan to take it easy for the rest of today and you should NOT DRIVE or use heavy machinery until tomorrow (because of the sedation medicines used during the test).   ? ?FOLLOW UP: ?Our staff will call the number listed on your records 48-72 hours following your procedure to check on you and address any questions or concerns that you may have regarding the information given to you following your procedure. If we do not reach you, we will leave a message.  We will attempt to reach you two times.  During this call, we will ask if you have developed any symptoms of COVID 19. If you develop any symptoms (ie: fever, flu-like symptoms, shortness of breath, cough etc.) before then, please call [phone].  If you test positive for Covid 19 in the 2 weeks post procedure, please call and report this information to [place].   ? ?If any biopsies were taken you will be contacted by phone or by letter within the next 1-3 weeks.  Please call us at [phone] if you have not heard about the biopsies in 3 weeks.  ? ? ?SIGNATURES/CONFIDENTIALITY: ?You and/or your care partner have signed paperwork which will be entered into your electronic medical record.  These signatures attest to the fact that that the information above on your After Visit Summary has been reviewed and is understood.  Full responsibility of the confidentiality of this discharge information lies with you and/or your care-partner.  ?

## 2021-12-07 NOTE — Progress Notes (Signed)
Pt non-responsive, VVS, Report to RN  °

## 2021-12-07 NOTE — Progress Notes (Signed)
VS completed by DT.  Pt's states no medical or surgical changes since previsit or office visit.  

## 2021-12-07 NOTE — Progress Notes (Signed)
[facility] History and Physical ? ? ?Primary Care Physician:  [name], MD ? ? ?Reason for Procedure:   Follow up gastric ulcers/erosions ? ?Plan:    EGD ? ? ? ? ?HPI: [name] Love is a 66 y.o. male undergoing repeat EGD to assessing healing of NSAID-induced gastric ulcers and erosions found in March.  He denies any upper GI symptoms.  He has been taking omeprazole twice daily. ?  ? ? ?Past Medical History:  ?Diagnosis Date  ? Anemia   ? Arthritis   ? Coronary artery disease   ? post CABG x3 in 2007  ? Diabetes mellitus   ? Hyperlipidemia   ? Hypertension   ? Kidney stone 1990  ? Obesity   ? ? ?Past Surgical History:  ?Procedure Laterality Date  ? CARDIAC CATHETERIZATION  EF= 45-50%  ? EF= 45-50% -- Three-vessel coronary artery disease with high-grade lesions in the proximal left anterior descending artery and multiple lesions in the right coronary -- Low normal ejection fraction -- Stable exertional angina -- [name], M.D. [facility]  ? COLONOSCOPY  09/26/2021  ? CORONARY ARTERY BYPASS GRAFT  02/19/2006  ?  x3 (left internal mammary artery to LAD, left radial artery to posterior descending, saphenous vein graft to first diagonal), endoscopic vein harvest right leg -- SURGEON:  [name]. [name], M.D.  ? HERNIA REPAIR    ? at 66 y/o  ? SHOULDER ARTHROSCOPY Right 2007  ? tripple bypass    ? UPPER GASTROINTESTINAL ENDOSCOPY    ? ? ?Prior to Admission medications   ?Medication Sig Start Date End Date Taking? Authorizing Provider  ?atorvastatin (LIPITOR) 40 MG tablet Take 1 tablet by mouth once daily 10/07/21  Yes [name], MD  ?FARXIGA 10 MG TABS tablet Take 1 tablet by mouth once daily 09/23/21  Yes Philemon Kingdom, MD  ?losartan (COZAAR) 50 MG tablet Take 1 tablet (50 mg total) by mouth daily. 07/04/21  Yes SagardiaInes Bloomer, MD  ?Magnesium Glycinate 665 MG CAPS Take 1 tablet by mouth in the morning and at bedtime.   Yes [provider]  ?metFORMIN  (GLUCOPHAGE-XR) 500 MG 24 hr tablet Take 2 tablets (1,000 mg total) by mouth in the morning and at bedtime. 01/03/21  Yes Philemon Kingdom, MD  ?Omega-3 Fatty Acids (FISH OIL) 1000 MG CAPS Take 3 capsules (3,000 mg total) by mouth 2 (two) times daily. 08/19/19  Yes [name], MD  ?omeprazole (PRILOSEC) 20 MG capsule Take 1 capsule (20 mg total) by mouth 2 (two) times daily before a meal. 09/26/21  Yes Daryel November, MD  ?omeprazole (PRILOSEC) 20 MG capsule Take 1 capsule (20 mg total) by mouth 2 (two) times daily before a meal. 10/26/21  Yes Daryel November, MD  ?POTASSIUM GLUCONATE PO Take 595 mg by mouth 2 (two) times daily.   Yes [provider]  ?TURMERIC CURCUMIN PO Take by mouth.   Yes [provider]  ?aspirin EC 81 MG tablet Take 1 tablet (81 mg total) by mouth daily. 07/30/18   Imogene Burn, PA-C  ?brompheniramine-pseudoephedrine-dextromethorphan (DIMETAPP DM) 15-1-5 MG/5ML ELIX Take by mouth every 6 (six) hours as needed.    [provider]  ?Continuous Blood Gluc Sensor (FREESTYLE LIBRE 14 DAY SENSOR) MISC USE SENSOR EVERY 14 DAYS. CHANGE SENSOR EVERY 2 WEEKS AS DIRECTED 09/08/21   Philemon Kingdom, MD  ?naproxen sodium (ALEVE) 220 MG tablet Take 220 mg by mouth daily as needed.    [provider]  ?Pseudoeph-Doxylamine-DM-APAP (NYQUIL PO) Take by mouth.    [provider]  ?Semaglutide, 1 MG/DOSE, (OZEMPIC, 1 MG/DOSE,) 4 MG/3ML SOPN Inject 1 mg into the skin once a week. 10/07/21   Philemon Kingdom, MD  ? ? ?Current Outpatient Medications  ?Medication Sig Dispense Refill  ? atorvastatin (LIPITOR) 40 MG tablet Take 1 tablet by mouth once daily 90 tablet 3  ? FARXIGA 10 MG TABS tablet Take 1 tablet by mouth once daily 90 tablet 3  ? losartan (COZAAR) 50 MG tablet Take 1 tablet (50 mg total) by mouth daily. 90 tablet 3  ? Magnesium Glycinate 665 MG CAPS Take 1 tablet by mouth in the morning and at bedtime.    ? metFORMIN (GLUCOPHAGE-XR) 500 MG  24 hr tablet Take 2 tablets (1,000 mg total) by mouth in the morning and at bedtime. 360 tablet 3  ? Omega-3 Fatty Acids (FISH OIL) 1000 MG CAPS Take 3 capsules (3,000 mg total) by mouth 2 (two) times daily. 180 capsule 11  ? omeprazole (PRILOSEC) 20 MG capsule Take 1 capsule (20 mg total) by mouth 2 (two) times daily before a meal. 30 capsule 2  ? omeprazole (PRILOSEC) 20 MG capsule Take 1 capsule (20 mg total) by mouth 2 (two) times daily before a meal. 60 capsule 3  ? POTASSIUM GLUCONATE PO Take 595 mg by mouth 2 (two) times daily.    ? TURMERIC CURCUMIN PO Take by mouth.    ? aspirin EC 81 MG tablet Take 1 tablet (81 mg total) by mouth daily. 90 tablet 3  ? brompheniramine-pseudoephedrine-dextromethorphan (DIMETAPP DM) 15-1-5 MG/5ML ELIX Take by mouth every 6 (six) hours as needed.    ? Continuous Blood Gluc Sensor (FREESTYLE LIBRE 14 DAY SENSOR) MISC USE SENSOR EVERY 14 DAYS. CHANGE SENSOR EVERY 2 WEEKS AS DIRECTED 6 each 0  ? naproxen sodium (ALEVE) 220 MG tablet Take 220 mg by mouth daily as needed.    ? Pseudoeph-Doxylamine-DM-APAP (NYQUIL PO) Take by mouth.    ? Semaglutide, 1 MG/DOSE, (OZEMPIC, 1 MG/DOSE,) 4 MG/3ML SOPN Inject 1 mg into the skin once a week. 9 mL 1  ? ?Current Facility-Administered Medications  ?Medication Dose Route Frequency Provider Last Rate Last Admin  ? 0.9 %  sodium chloride infusion  500 mL Intravenous Once Daryel November, MD      ? ? ?Allergies as of 12/07/2021  ? (No Known Allergies)  ? ? ?Family History  ?Problem Relation Age of Onset  ? Heart disease Mother   ? Colon cancer Mother   ?     29  ? Hypertension Mother   ? Cancer Father   ?     type unknown  ? Kidney disease Maternal Aunt   ? Diabetes Maternal Uncle   ? Heart attack Maternal Grandfather 52  ? Colon polyps Neg Hx   ? Esophageal cancer Neg Hx   ? Rectal cancer Neg Hx   ? Stomach cancer Neg Hx   ? ? ?Social History  ? ?Socioeconomic History  ? Marital status: Married  ?  Spouse name: Not on file  ? Number of  children: 3  ? Years of education: Not on file  ? Highest education level: Not on file  ?Occupational History  ? Occupation: retired  ?Tobacco Use  ? Smoking status: Never  ? Smokeless tobacco: Never  ?Vaping Use  ? Vaping Use: Never used  ?Substance and Sexual Activity  ? Alcohol use: Yes  ?  Alcohol/week:  1.0 standard drink  ?  Types: 1 Standard drinks or equivalent per week  ?  Comment: 1 drink monthly  ? Drug use: Never  ? Sexual activity: Yes  ?Other Topics Concern  ? Not on file  ?Social History Narrative  ? Not on file  ? ?Social Determinants of Health  ? ?Financial Resource Strain: Not on file  ?Food Insecurity: Not on file  ?Transportation Needs: Not on file  ?Physical Activity: Not on file  ?Stress: Not on file  ?Social Connections: Not on file  ?Intimate Partner Violence: Not on file  ? ? ?Review of Systems: ? ?All other review of systems negative except as mentioned in the HPI. ? ?Physical Exam: ?Vital signs ?BP 134/72   Pulse 75   Temp 98.4 ?F (36.9 ?C) (Temporal)   Ht '5\' 10"'$  (1.778 m)   Wt 215 lb (97.5 kg)   SpO2 96%   BMI 30.85 kg/m?  ? ?General:   Alert,  Well-developed, well-nourished, pleasant and cooperative in NAD ?Airway:  Mallampati 3 ?Lungs:  Clear throughout to auscultation.   ?Heart:  Regular rate and rhythm; no murmurs, clicks, rubs,  or gallops. ?Abdomen:  Soft, nontender and nondistended. Normal bowel sounds.   ?Neuro/Psych:  Normal mood and affect. A and O x 3 ? ? ?Trevell Pariseau E. Candis Schatz, MD ?[facility] ? ?

## 2021-12-07 NOTE — Op Note (Signed)
[name] Love ?Patient Name: [name] Love ?Procedure Date: 12/07/2021 11:22 AM ?MRN: [id number] ?Endoscopist: Akita Maxim E. Candis Schatz , MD ?Age: 66 ?Referring MD:  ?Date of Birth: 07-16-1956 ?Gender: Male ?Account #: [id number] ?Procedure:                Upper GI endoscopy ?Indications:              Follow-up of gastric ulcers ?Medicines:                Monitored Anesthesia Care ?Procedure:                Pre-Anesthesia Assessment: ?                          - Prior to the procedure, a History and Physical  ?                          was performed, and patient medications and  ?                          allergies were reviewed. The patient's tolerance of  ?                          previous anesthesia was also reviewed. The risks  ?                          and benefits of the procedure and the sedation  ?                          options and risks were discussed with the patient.  ?                          All questions were answered, and informed consent  ?                          was obtained. Prior Anticoagulants: The patient has  ?                          taken no previous anticoagulant or antiplatelet  ?                          agents except for aspirin. ASA Grade Assessment:  ?                          III - A patient with severe systemic disease. After  ?                          reviewing the risks and benefits, the patient was  ?                          deemed in satisfactory condition to undergo the  ?                          procedure. ?  After obtaining informed consent, the endoscope was  ?                          passed under direct vision. Throughout the  ?                          procedure, the patient's blood pressure, pulse, and  ?                          oxygen saturations were monitored continuously. The  ?                          GIF [id number] #[id number] was introduced through the  ?                          mouth, and advanced to the third part of  duodenum.  ?                          The upper GI endoscopy was accomplished without  ?                          difficulty. The patient tolerated the procedure  ?                          well. ?Scope In: ?Scope Out: ?Findings:                 The examined portions of the nasopharynx,  ?                          oropharynx and larynx were normal. ?                          The examined esophagus was normal. ?                          Multiple localized small erosions with no bleeding  ?                          and no stigmata of recent bleeding were found in  ?                          the gastric antrum. Biopsies were taken with a cold  ?                          forceps for Helicobacter pylori testing. Estimated  ?                          blood loss was minimal. ?                          The exam of the stomach was otherwise normal. ?                          The examined duodenum was normal. ?Complications:  No immediate complications. ?Estimated Blood Loss:     Estimated blood loss was minimal. ?Impression:               - The examined portions of the nasopharynx,  ?                          oropharynx and larynx were normal. ?                          - Normal esophagus. ?                          - Erosive gastropathy with no bleeding and no  ?                          stigmata of recent bleeding. Biopsied. ?                          - Normal examined duodenum. ?Recommendation:           - Patient has a contact number available for  ?                          emergencies. The signs and symptoms of potential  ?                          delayed complications were discussed with the  ?                          patient. Return to normal activities tomorrow.  ?                          Written discharge instructions were provided to the  ?                          patient. ?                          - Resume previous diet. ?                          - Continue present medications. ?                           - No aspirin, ibuprofen, naproxen, or other  ?                          non-steroidal anti-inflammatory drugs. ?                          - Await pathology results. ?                          - Recommend repeating upper endoscopy again in 8  ?                          weeks to assess resolution of erosive gastropathy ?[name] Love E. Candis Schatz,  MD ?12/07/2021 12:02:43 PM ?This report has been signed electronically. ?

## 2021-12-08 ENCOUNTER — Telehealth: Payer: Self-pay

## 2021-12-08 ENCOUNTER — Telehealth: Payer: Self-pay | Admitting: *Deleted

## 2021-12-08 NOTE — Telephone Encounter (Signed)
Called patient to reschedule EGD for 8 weeks. He said that the MD wanted to wait on pathology results before scheduling. Recall placed.

## 2021-12-08 NOTE — Telephone Encounter (Signed)
  Follow up Call-     12/07/2021   10:23 AM 09/26/2021   10:06 AM  Call back number  Post procedure Call Back phone  # [phone]  Permission to leave phone message Yes Yes     Patient questions:  Do you have a fever, pain , or abdominal swelling? No. Pain Score  0 *  Have you tolerated food without any problems? Yes.    Have you been able to return to your normal activities? Yes.    Do you have any questions about your discharge instructions: Diet   No. Medications  No. Follow up visit  No.  Do you have questions or concerns about your Care? No.  Actions: * If pain score is 4 or above: No action needed, pain <4.

## 2021-12-08 NOTE — Telephone Encounter (Signed)
  Follow up Call-     12/07/2021   10:23 AM 09/26/2021   10:06 AM  Call back number  Post procedure Call Back phone  # [phone]  Permission to leave phone message Yes Yes    Left message

## 2021-12-13 ENCOUNTER — Other Ambulatory Visit: Payer: Self-pay

## 2021-12-13 DIAGNOSIS — D509 Iron deficiency anemia, unspecified: Secondary | ICD-10-CM

## 2021-12-13 NOTE — Progress Notes (Signed)
[name] Love, The biopsies taken from your stomach were notable for mild reactive gastropathy which is a common finding and often related to use of certain medications (usually NSAIDs), but there was no evidence of Helicobacter pylori infection.  In your case, aspirin may be the causative agent here.  We can consider repeating upper endoscopy after stopping the aspirin for period of time.  I would recommend you do this in conjunction with a discussion with your primary care provider, or whoever is recommending you take a daily aspirin. I would like to see if your anemia has improved.  Please come by our lab at your convenience to repeat blood tests  Vaughan Basta, Can you please place an order for a CBC and iron panel?

## 2022-01-10 ENCOUNTER — Other Ambulatory Visit (INDEPENDENT_AMBULATORY_CARE_PROVIDER_SITE_OTHER): Payer: Medicare PPO

## 2022-01-10 DIAGNOSIS — D509 Iron deficiency anemia, unspecified: Secondary | ICD-10-CM | POA: Diagnosis not present

## 2022-01-10 LAB — IBC + FERRITIN
Ferritin: 24.9 ng/mL (ref 22.0–322.0)
Iron: 67 ug/dL (ref 42–165)
Saturation Ratios: 12.5 % — ABNORMAL LOW (ref 20.0–50.0)
TIBC: 534.8 ug/dL — ABNORMAL HIGH (ref 250.0–450.0)
Transferrin: 382 mg/dL — ABNORMAL HIGH (ref 212.0–360.0)

## 2022-01-10 LAB — CBC WITH DIFFERENTIAL/PLATELET
Basophils Absolute: 0.1 10*3/uL (ref 0.0–0.1)
Basophils Relative: 1 % (ref 0.0–3.0)
Eosinophils Absolute: 0.1 10*3/uL (ref 0.0–0.7)
Eosinophils Relative: 1.4 % (ref 0.0–5.0)
HCT: 44.8 % (ref 39.0–52.0)
Hemoglobin: 14.7 g/dL (ref 13.0–17.0)
Lymphocytes Relative: 23.7 % (ref 12.0–46.0)
Lymphs Abs: 1.3 10*3/uL (ref 0.7–4.0)
MCHC: 32.9 g/dL (ref 30.0–36.0)
MCV: 83.3 fl (ref 78.0–100.0)
Monocytes Absolute: 0.4 10*3/uL (ref 0.1–1.0)
Monocytes Relative: 8 % (ref 3.0–12.0)
Neutro Abs: 3.6 10*3/uL (ref 1.4–7.7)
Neutrophils Relative %: 65.9 % (ref 43.0–77.0)
Platelets: 170 10*3/uL (ref 150.0–400.0)
RBC: 5.38 Mil/uL (ref 4.22–5.81)
RDW: 15.4 % (ref 11.5–15.5)
WBC: 5.5 10*3/uL (ref 4.0–10.5)

## 2022-01-11 ENCOUNTER — Other Ambulatory Visit: Payer: Self-pay

## 2022-01-11 ENCOUNTER — Encounter: Payer: Self-pay | Admitting: Gastroenterology

## 2022-01-11 DIAGNOSIS — D509 Iron deficiency anemia, unspecified: Secondary | ICD-10-CM

## 2022-01-11 NOTE — Progress Notes (Signed)
[name] Love,  Your hemoglobin has improved significantly and is now well within normal range.  Your iron panel is still suggestive that your iron stores are a little low and you would benefit from continued oral iron supplements for another few months. I would recommend checking a CBC and iron panel again in 3 months.

## 2022-01-16 ENCOUNTER — Encounter: Payer: Self-pay | Admitting: Emergency Medicine

## 2022-01-16 DIAGNOSIS — M545 Low back pain, unspecified: Secondary | ICD-10-CM

## 2022-01-17 DIAGNOSIS — E1159 Type 2 diabetes mellitus with other circulatory complications: Secondary | ICD-10-CM | POA: Diagnosis not present

## 2022-01-19 DIAGNOSIS — M5451 Vertebrogenic low back pain: Secondary | ICD-10-CM | POA: Diagnosis not present

## 2022-01-19 DIAGNOSIS — M5416 Radiculopathy, lumbar region: Secondary | ICD-10-CM | POA: Diagnosis not present

## 2022-01-31 DIAGNOSIS — M5451 Vertebrogenic low back pain: Secondary | ICD-10-CM | POA: Diagnosis not present

## 2022-01-31 DIAGNOSIS — E119 Type 2 diabetes mellitus without complications: Secondary | ICD-10-CM | POA: Diagnosis not present

## 2022-01-31 LAB — HM DIABETES EYE EXAM

## 2022-02-02 DIAGNOSIS — M545 Low back pain, unspecified: Secondary | ICD-10-CM | POA: Diagnosis not present

## 2022-02-02 DIAGNOSIS — M5451 Vertebrogenic low back pain: Secondary | ICD-10-CM | POA: Diagnosis not present

## 2022-02-06 ENCOUNTER — Ambulatory Visit: Payer: Medicare PPO | Admitting: Internal Medicine

## 2022-02-06 ENCOUNTER — Encounter: Payer: Self-pay | Admitting: Internal Medicine

## 2022-02-06 VITALS — BP 140/84 | HR 73 | Ht 70.0 in | Wt 202.8 lb

## 2022-02-06 DIAGNOSIS — E1159 Type 2 diabetes mellitus with other circulatory complications: Secondary | ICD-10-CM

## 2022-02-06 DIAGNOSIS — Z6832 Body mass index (BMI) 32.0-32.9, adult: Secondary | ICD-10-CM | POA: Diagnosis not present

## 2022-02-06 DIAGNOSIS — E669 Obesity, unspecified: Secondary | ICD-10-CM | POA: Diagnosis not present

## 2022-02-06 DIAGNOSIS — E1165 Type 2 diabetes mellitus with hyperglycemia: Secondary | ICD-10-CM | POA: Diagnosis not present

## 2022-02-06 DIAGNOSIS — E785 Hyperlipidemia, unspecified: Secondary | ICD-10-CM | POA: Diagnosis not present

## 2022-02-06 LAB — POCT GLYCOSYLATED HEMOGLOBIN (HGB A1C): Hemoglobin A1C: 6.8 % — AB (ref 4.0–5.6)

## 2022-02-06 MED ORDER — EMPAGLIFLOZIN 25 MG PO TABS: 25.0000 mg | ORAL_TABLET | Freq: Every day | ORAL | 3 refills | Status: DC

## 2022-02-06 NOTE — Progress Notes (Signed)
Patient ID: [name] Love, male   DOB: 06/12/1956, 66 y.o.   MRN: [id number]   HPI: [name] Love is a 66 y.o.-year-old male, initially referred by his PCP, Dr. Mitchel Love, returning for follow-up for DM2, dx in ~2008, non-insulin-dependent, uncontrolled, with long term complications (CAD - s/p CABG in 2007, CKD, PN, DR).  Last visit 4.5 months ago.  Interim history: No increased urination, blurry vision, nausea, chest pain.  Before last OV, he had 2 steroid injections - in hip and spine.  He had 2 more steroid injections since.  He is now getting acupuncture for low back pain. He is flying to [place].  Reviewing HbA1c levels: Lab Results  Component Value Date   HGBA1C 7.1 (A) 09/19/2021   HGBA1C 7.1 (A) 05/16/2021   HGBA1C 7.3 (A) 01/03/2021   HGBA1C 6.4 (A) 09/14/2020   HGBA1C 6.9 (A) 05/12/2020   HGBA1C 6.9 (A) 12/02/2019   HGBA1C 6.9 (A) 08/04/2019   HGBA1C 7.3 (H) 06/10/2019   HGBA1C 6.8 (A) 04/02/2019   HGBA1C 7.0 (A) 11/05/2018   HGBA1C 6.5 (A) 07/10/2018   HGBA1C 6.8 (A) 05/27/2018   HGBA1C 6.9 (A) 03/21/2018   HGBA1C 6.4 11/06/2017   HGBA1C 8.2 06/21/2017  He got a steroid inj in shoulder >> 05/10/2017.  Pt is on a regimen of: - Metformin 1000 mg 2x a day, with meals >> ER formulation -improved diarrhea - Farxiga 10 mg before b'fast - Trulicity 1.5 mg weekly-started 05/2017 >> 3 mg weekly >> Ozempic 1 mg weekly We stopped glipizide in 11/2019.  He checks his sugars more than 4 times a day with his freestyle libre CGM:   Previously:   Previously:   Lowest sugar was 66 (Libre) >> 80 >> 70 >> 90 >> 80; it is unclear at which level he has hypoglycemia awareness. Highest sugar was 365 (erroneous?) ...>> 200s >> 215 >> 298 >> 200s.  Pt's meals are: - Breakfast: bagel, OJ >> cereals >> occas. Cereals, or eggs  >> stopped OJ  - Lunch: soup or sandwich or leftovers - Dinner: meat, veggies - Snacks: not daily  -+ Mild CKD, last  BUN/creatinine:  Lab Results  Component Value Date   BUN 13 07/04/2021   BUN 16 06/24/2020   CREATININE 1.03 07/04/2021   CREATININE 1.05 06/24/2020  On losartan 50.  -+ HL; last set of lipids: Lab Results  Component Value Date   CHOL 87 07/04/2021   HDL 31.60 (L) 07/04/2021   LDLCALC 25 07/04/2021   LDLDIRECT 51 06/21/2017   TRIG 152.0 (H) 07/04/2021   CHOLHDL 3 07/04/2021  On Lipitor, omega-3 fatty acids 3000 mg 2x a day.  - last eye exam was in this month.  : + DR.  Dr. Alois Love.  -+ Numbness and tingling in his feet.  Last foot exam 09/19/2021.  On ASA 81.  He has a history of a slightly elevated TSH, which normalized at last visit: Lab Results  Component Value Date   TSH 1.67 09/19/2021   TSH 5.170 (H) 06/24/2020   TSH 2.918 05/22/2013   TSH 1.843 09/12/2012   ROS: + See HPI Neurological: no tremors/+ numbness/+ tingling/no dizziness  I reviewed pt's medications, allergies, PMH, social hx, family hx, and changes were documented in the history of present illness. Otherwise, unchanged from my initial visit note.  Past Medical History:  Diagnosis Date   Anemia    Arthritis    Coronary artery disease    post CABG x3 in 2007  Diabetes mellitus    Hyperlipidemia    Hypertension    Kidney stone 1990   Obesity    Past Surgical History:  Procedure Laterality Date   CARDIAC CATHETERIZATION  EF= 45-50%   EF= 45-50% -- Three-vessel coronary artery disease with high-grade lesions in the proximal left anterior descending artery and multiple lesions in the right coronary -- Low normal ejection fraction -- Stable exertional angina -- [name] Love, M.D. [facility]   COLONOSCOPY  09/26/2021   CORONARY ARTERY BYPASS GRAFT  02/19/2006    x3 (left internal mammary artery to LAD, left radial artery to posterior descending, saphenous vein graft to first diagonal), endoscopic vein harvest right leg -- SURGEON:  [name] Love. [name] Love, M.D.   HERNIA REPAIR     at 66 y/o    SHOULDER ARTHROSCOPY Right 2007   tripple bypass     UPPER GASTROINTESTINAL ENDOSCOPY     Social History   Socioeconomic History   Marital status: Married    Spouse name: Not on file   Number of children: 3  Social Needs  Occupational History   unemployed  Tobacco Use   Smoking status: Never Smoker   Smokeless tobacco: Never Used  Substance and Sexual Activity   Alcohol use: Yes    Alcohol/week:     Types: 1-2 per mo   Drug use: No      Other Topics      Social History Narrative      Current Outpatient Medications on File Prior to Visit  Medication Sig Dispense Refill   aspirin EC 81 MG tablet Take 1 tablet (81 mg total) by mouth daily. 90 tablet 3   atorvastatin (LIPITOR) 40 MG tablet Take 1 tablet by mouth once daily 90 tablet 3   brompheniramine-pseudoephedrine-dextromethorphan (DIMETAPP DM) 15-1-5 MG/5ML ELIX Take by mouth every 6 (six) hours as needed.     Continuous Blood Gluc Sensor (FREESTYLE LIBRE 14 DAY SENSOR) MISC USE SENSOR EVERY 14 DAYS. CHANGE SENSOR EVERY 2 WEEKS AS DIRECTED 6 each 0   FARXIGA 10 MG TABS tablet Take 1 tablet by mouth once daily 90 tablet 3   losartan (COZAAR) 50 MG tablet Take 1 tablet (50 mg total) by mouth daily. 90 tablet 3   Magnesium Glycinate 665 MG CAPS Take 1 tablet by mouth in the morning and at bedtime.     metFORMIN (GLUCOPHAGE-XR) 500 MG 24 hr tablet Take 2 tablets (1,000 mg total) by mouth in the morning and at bedtime. 360 tablet 3   naproxen sodium (ALEVE) 220 MG tablet Take 220 mg by mouth daily as needed.     Omega-3 Fatty Acids (FISH OIL) 1000 MG CAPS Take 3 capsules (3,000 mg total) by mouth 2 (two) times daily. 180 capsule 11   omeprazole (PRILOSEC) 20 MG capsule Take 1 capsule (20 mg total) by mouth 2 (two) times daily before a meal. 60 capsule 3   POTASSIUM GLUCONATE PO Take 595 mg by mouth 2 (two) times daily.     Pseudoeph-Doxylamine-DM-APAP (NYQUIL PO) Take by mouth.     Semaglutide, 1 MG/DOSE, (OZEMPIC, 1 MG/DOSE,) 4  MG/3ML SOPN Inject 1 mg into the skin once a week. 9 mL 1   TURMERIC CURCUMIN PO Take by mouth.     No current facility-administered medications on file prior to visit.   No Known Allergies Family History  Problem Relation Age of Onset   Heart disease Mother    Colon cancer Mother        68  Hypertension Mother    Cancer Father        type unknown   Kidney disease Maternal Aunt    Diabetes Maternal Uncle    Heart attack Maternal Grandfather 52   Colon polyps Neg Hx    Esophageal cancer Neg Hx    Rectal cancer Neg Hx    Stomach cancer Neg Hx    PE: BP 140/84 (BP Location: Right Arm, Patient Position: Sitting, Cuff Size: Normal)   Pulse 73   Ht '5\' 10"'$  (1.778 m)   Wt 202 lb 12.8 oz (92 kg)   SpO2 97%   BMI 29.10 kg/m  Wt Readings from Last 3 Encounters:  02/06/22 202 lb 12.8 oz (92 kg)  12/07/21 215 lb (97.5 kg)  09/26/21 215 lb (97.5 kg)   Constitutional: overweight, in NAD Eyes: PERRLA, EOMI, no exophthalmos ENT: moist mucous membranes, no thyromegaly, no cervical lymphadenopathy Cardiovascular: RRR, No MRG Respiratory: CTA B Musculoskeletal: no deformities Skin: moist, warm, no rashes Neurological: no tremor with outstretched hands  ASSESSMENT: 1. DM2, non-insulin-dependent, uncontrolled, without complications - CAD - s/p CABG in 2007 - CKD - PN - DR  2. HL  3. Obesity class I  PLAN:  1. Patient with longstanding, uncontrolled, type 2 diabetes, on metformin ER, SGLT2 inhibitor and also weekly GLP-1 receptor agonist, with overall good control but HbA1c still above target.  At last visit, this was stable, at 7.1%.  Reviewing his CGM traces, sugars are higher after breakfast and lunch and also after dinner.  They were improving overnight.  He did mention that he saw higher blood sugars after he steroid injection and recent sickness.  At that time, we tried to change from Trulicity to [facility] but did not make other changes in his regimen. CGM interpretation: -At  today's visit, we reviewed his CGM downloads: It appears that 81% of values are in target range (goal >70%), while % are higher than 180 (goal <25%), and 0% are lower than 70 (goal <4%).  The calculated average blood sugar is 156.  The projected HbA1c for the next 3 months (GMI) is 7.0%. -Reviewing the CGM trends, sugars appear to be fairly well controlled but with hyperglycemic spike after breakfast (most likely due to cereals -discussed about reducing these) and occasionally after a larger dinner.  Overall, I do not feel we need to change his regimen.  However, he mentions that Wilder Glade is now expensive >> we will try to send a prescription for Jardiance to his pharmacy.  We will continue metformin and Ozempic for now. - I suggested to:  Patient Instructions  Please continue:  - Metformin ER 1000 mg 2x a day, with meals - Farxiga 10 mg before b'fast - Ozempic 1 mg weekly   Please return in 4 months.  - we checked his HbA1c: 6.8% (lower) - advised to check sugars at different times of the day - 4x a day, rotating check times - advised for yearly eye exams >> he is UTD - return to clinic in 4 months  2.  Hyperlipidemia -Reviewed latest lipid panel from 06/2021: Fractions at goal with the exception of a low HDL: Lab Results  Component Value Date   CHOL 87 07/04/2021   HDL 31.60 (L) 07/04/2021   LDLCALC 25 07/04/2021   LDLDIRECT 51 06/21/2017   TRIG 152.0 (H) 07/04/2021   CHOLHDL 3 07/04/2021  -He continues on Lipitor 40 mg daily, omega-3 fatty acids 3 g twice a day without side effects  3.  Obesity class I -continue SGLT 2 inhibitor and GLP-1 receptor agonist which should also help with weight loss -Since last visit, he lost 13 pounds!  Philemon Kingdom, MD PhD [facility]

## 2022-02-06 NOTE — Patient Instructions (Addendum)
Please continue:  - Metformin ER 1000 mg 2x a day, with meals - Farxiga 10/Jardiance 25 mg before b'fast - Ozempic 1 mg weekly   Please return in 4 months.

## 2022-02-16 ENCOUNTER — Other Ambulatory Visit: Payer: Self-pay | Admitting: Internal Medicine

## 2022-02-16 ENCOUNTER — Encounter: Payer: Self-pay | Admitting: Internal Medicine

## 2022-02-23 ENCOUNTER — Ambulatory Visit
Admission: RE | Admit: 2022-02-23 | Discharge: 2022-02-23 | Disposition: A | Discharge status: Home / Self Care | Payer: Medicare PPO | Source: Ambulatory Visit | Attending: Physician Assistant | Admitting: Physician Assistant

## 2022-02-23 VITALS — BP 120/74 | HR 100 | Temp 97.9°F | Resp 18

## 2022-02-23 DIAGNOSIS — J40 Bronchitis, not specified as acute or chronic: Secondary | ICD-10-CM | POA: Diagnosis not present

## 2022-02-23 DIAGNOSIS — J329 Chronic sinusitis, unspecified: Secondary | ICD-10-CM

## 2022-02-23 DIAGNOSIS — R051 Acute cough: Secondary | ICD-10-CM

## 2022-02-23 MED ORDER — CEFDINIR 300 MG PO CAPS: 300.0000 mg | ORAL_CAPSULE | Freq: Two times a day (BID) | ORAL | 0 refills | Status: DC

## 2022-02-23 MED ORDER — PROMETHAZINE-DM 6.25-15 MG/5ML PO SYRP: 5.0000 mL | ORAL_SOLUTION | Freq: Every evening | ORAL | 0 refills | Status: DC | PRN

## 2022-02-23 MED ORDER — BENZONATATE 100 MG PO CAPS: 100.0000 mg | ORAL_CAPSULE | Freq: Three times a day (TID) | ORAL | 0 refills | Status: DC

## 2022-02-23 NOTE — ED Provider Notes (Signed)
[facility]    CSN: [id number] Arrival date & time: 02/23/22  1254      History   Chief Complaint Chief Complaint  Patient presents with   Cough    Have had a cold for several days. - Entered by patient    HPI [name] Love is a 66 y.o. male.   Patient presents today with a weeklong history of URI symptoms.  He reports nasal congestion, cough, fatigue, malaise.  Denies fever, chest pain, shortness of breath, nausea, vomiting.  He does report that typically when he gets sick this turns into bronchitis but denies history of asthma or COPD.  He does not smoke.  He has been using over-the-counter medications without improvement of symptoms.  He denies any recent antibiotic or steroid use.  Denies any known sick contacts.  He did take a COVID-19 test when he was home that was negative.  He does have a history of diabetes but reports that his blood sugars are adequately controlled.  Reports that generally when he gets sick like this he requires an antibiotic as well as some cough medication.    Past Medical History:  Diagnosis Date   Anemia    Arthritis    Coronary artery disease    post CABG x3 in 2007   Diabetes mellitus    Hyperlipidemia    Hypertension    Kidney stone 1990   Obesity     Patient Active Problem List   Diagnosis Date Noted   Decreased calculated GFR 08/08/2017   Erectile dysfunction 09/15/2013   Nonspecific abnormal electrocardiogram (ECG) (EKG) 06/10/2013   Poorly controlled type 2 diabetes mellitus with circulatory disorder ([place]) 09/12/2012   Coronary artery disease 07/14/2011   History of coronary artery bypass surgery 07/14/2011   Hyperlipidemia 02/23/2009   Obesity 02/23/2009   Essential hypertension 02/23/2009    Past Surgical History:  Procedure Laterality Date   CARDIAC CATHETERIZATION  EF= 45-50%   EF= 45-50% -- Three-vessel coronary artery disease with high-grade lesions in the proximal left anterior descending artery and  multiple lesions in the right coronary -- Low normal ejection fraction -- Stable exertional angina -- [name], M.D. [facility]   COLONOSCOPY  09/26/2021   CORONARY ARTERY BYPASS GRAFT  02/19/2006    x3 (left internal mammary artery to LAD, left radial artery to posterior descending, saphenous vein graft to first diagonal), endoscopic vein harvest right leg -- SURGEON:  [name]. [name], M.D.   HERNIA REPAIR     at 66 y/o   SHOULDER ARTHROSCOPY Right 2007   tripple bypass     UPPER GASTROINTESTINAL ENDOSCOPY         Home Medications    Prior to Admission medications   Medication Sig Start Date End Date Taking? Authorizing Provider  benzonatate (TESSALON) 100 MG capsule Take 1 capsule (100 mg total) by mouth every 8 (eight) hours. 02/23/22  Yes Spring San K, PA-C  cefdinir (OMNICEF) 300 MG capsule Take 1 capsule (300 mg total) by mouth 2 (two) times daily. 02/23/22  Yes Mckinsey Keagle K, PA-C  promethazine-dextromethorphan (PROMETHAZINE-DM) 6.25-15 MG/5ML syrup Take 5 mLs by mouth at bedtime as needed for cough. 02/23/22  Yes Wynema Garoutte, Derry Skill, PA-C  aspirin EC 81 MG tablet Take 1 tablet (81 mg total) by mouth daily. 07/30/18   Imogene Burn, PA-C  atorvastatin (LIPITOR) 40 MG tablet Take 1 tablet by mouth once daily 10/07/21   [name], MD  Continuous Blood Gluc Sensor (FREESTYLE [place]  14 DAY SENSOR) MISC USE SENSOR EVERY 14 DAYS. CHANGE SENSOR EVERY 2 WEEKS AS DIRECTED 09/08/21   Philemon Kingdom, MD  empagliflozin (JARDIANCE) 25 MG TABS tablet Take 1 tablet (25 mg total) by mouth daily before breakfast. 02/06/22   Philemon Kingdom, MD  FARXIGA 10 MG TABS tablet Take 1 tablet by mouth once daily 09/23/21   Philemon Kingdom, MD  losartan (COZAAR) 50 MG tablet Take 1 tablet (50 mg total) by mouth daily. 07/04/21   [name], MD  Magnesium Glycinate 665 MG CAPS Take 1 tablet by mouth in the morning and at bedtime.    [provider]  metFORMIN (GLUCOPHAGE-XR)  500 MG 24 hr tablet TAKE 2 TABLETS BY MOUTH IN THE MORNING AND AT BEDTIME 02/16/22   Philemon Kingdom, MD  naproxen sodium (ALEVE) 220 MG tablet Take 220 mg by mouth daily as needed.    [provider]  Omega-3 Fatty Acids (FISH OIL) 1000 MG CAPS Take 3 capsules (3,000 mg total) by mouth 2 (two) times daily. 08/19/19   [name], MD  omeprazole (PRILOSEC) 20 MG capsule Take 1 capsule (20 mg total) by mouth 2 (two) times daily before a meal. 10/26/21   Daryel November, MD  POTASSIUM GLUCONATE PO Take 595 mg by mouth 2 (two) times daily.    [provider]  Semaglutide, 1 MG/DOSE, (OZEMPIC, 1 MG/DOSE,) 4 MG/3ML SOPN Inject 1 mg into the skin once a week. 10/07/21   Philemon Kingdom, MD  TURMERIC CURCUMIN PO Take by mouth.    [provider]    Family History Family History  Problem Relation Age of Onset   Heart disease Mother    Colon cancer Mother        39   Hypertension Mother    Cancer Father        type unknown   Kidney disease Maternal Aunt    Diabetes Maternal Uncle    Heart attack Maternal Grandfather 41   Colon polyps Neg Hx    Esophageal cancer Neg Hx    Rectal cancer Neg Hx    Stomach cancer Neg Hx     Social History Social History   Tobacco Use   Smoking status: Never   Smokeless tobacco: Never  Vaping Use   Vaping Use: Never used  Substance Use Topics   Alcohol use: Yes    Alcohol/week: 1.0 standard drink of alcohol    Types: 1 Standard drinks or equivalent per week    Comment: 1 drink monthly   Drug use: Never     Allergies   Patient has no known allergies.   Review of Systems Review of Systems  Constitutional:  Positive for activity change. Negative for appetite change, fatigue and fever.  HENT:  Positive for congestion. Negative for sinus pressure, sneezing and sore throat.   Respiratory:  Positive for cough. Negative for shortness of breath.   Cardiovascular:  Negative for chest pain.  Gastrointestinal:   Negative for abdominal pain, diarrhea, nausea and vomiting.  Neurological:  Negative for dizziness, light-headedness and headaches.     Physical Exam Triage Vital Signs ED Triage Vitals  Enc Vitals Group     BP 02/23/22 1310 120/74     Pulse Rate 02/23/22 1310 100     Resp 02/23/22 1310 18     Temp 02/23/22 1310 97.9 F (36.6 C)     Temp Source 02/23/22 1310 Oral     SpO2 02/23/22 1310 96 %  Weight --      Height --      Head Circumference --      Peak Flow --      Pain Score 02/23/22 1323 0     Pain Loc --      Pain Edu? --      Excl. in [place]? --    No data found.  Updated Vital Signs BP 120/74 (BP Location: Left Arm)   Pulse 100   Temp 97.9 F (36.6 C) (Oral)   Resp 18   SpO2 96%   Visual Acuity Right Eye Distance:   Left Eye Distance:   Bilateral Distance:    Right Eye Near:   Left Eye Near:    Bilateral Near:     Physical Exam Vitals reviewed.  Constitutional:      General: He is awake.     Appearance: Normal appearance. He is well-developed. He is not ill-appearing.     Comments: Very pleasant male appears stated age in no acute distress sitting comfortably in exam room  HENT:     Head: Normocephalic and atraumatic.     Right Ear: Tympanic membrane, ear canal and external ear normal. There is impacted cerumen. Tympanic membrane is not erythematous or bulging.     Left Ear: Tympanic membrane, ear canal and external ear normal. There is impacted cerumen. Tympanic membrane is not erythematous or bulging.     Ears:     Comments: Cerumen impaction noted bilaterally.  Able to visualize approximately 30% of TM that appears normal.    Nose: Nose normal.     Mouth/Throat:     Pharynx: Uvula midline. Posterior oropharyngeal erythema present. No oropharyngeal exudate or uvula swelling.  Cardiovascular:     Rate and Rhythm: Normal rate and regular rhythm.     Heart sounds: Normal heart sounds, S1 normal and S2 normal. No murmur heard. Pulmonary:     Effort:  Pulmonary effort is normal. No accessory muscle usage or respiratory distress.     Breath sounds: Normal breath sounds. No stridor. No wheezing, rhonchi or rales.     Comments: Clear to auscultation bilaterally Neurological:     Mental Status: He is alert.  Psychiatric:        Behavior: Behavior is cooperative.      UC Treatments / Results  Labs (all labs ordered are listed, but only abnormal results are displayed) Labs Reviewed - No data to display  EKG   Radiology No results found.  Procedures Procedures (including critical care time)  Medications Ordered in UC Medications - No data to display  Initial Impression / Assessment and Plan / UC Course  I have reviewed the triage vital signs and the nursing notes.  Pertinent labs & imaging results that were available during my care of the patient were reviewed by me and considered in my medical decision making (see chart for details).     Patient is well-appearing, afebrile, nontoxic, nontachycardic.  No indication for viral testing has been symptomatic for a week and this would not change management.  Given prolonged and worsening symptoms will cover for secondary bacterial infection.  Patient was started on Omnicef 300 mg twice daily for 7 days.  He was prescribed Tessalon for cough as well as Promethazine DM for nocturnal cough.  Discussed that this is sedating and he should not drive or drink alcohol with taking it.  He can use over-the-counter medications including Mucinex, Flonase, Tylenol for symptom relief.  He is to rest  and drink plenty fluid.  Discussed that if symptoms are improving by next week he needs to return for reevaluation.  If he has any worsening symptoms including chest pain, shortness of breath, headache, nausea/vomiting, fever, weakness he needs to be seen immediately.  Final Clinical Impressions(s) / UC Diagnoses   Final diagnoses:  Sinobronchitis  Acute cough     Discharge Instructions      Start  cefdinir twice daily.  Use Tessalon for daytime cough.  Use Promethazine DM at night.  This will make you sleepy so do not drive or drink alcohol with taking it.  Use Mucinex, Flonase, Tylenol for symptom relief.  Make sure you rest and drink plenty fluid.  If her symptoms are improving by next week please return for reevaluation.  If you have any worsening symptoms including chest pain, shortness of breath, headache, vision change, nausea, vomiting, fever you need to be seen immediately.     ED Prescriptions     Medication Sig Dispense Auth. Provider   cefdinir (OMNICEF) 300 MG capsule Take 1 capsule (300 mg total) by mouth 2 (two) times daily. 14 capsule Damyen Knoll K, PA-C   benzonatate (TESSALON) 100 MG capsule Take 1 capsule (100 mg total) by mouth every 8 (eight) hours. 21 capsule Demonta Wombles K, PA-C   promethazine-dextromethorphan (PROMETHAZINE-DM) 6.25-15 MG/5ML syrup Take 5 mLs by mouth at bedtime as needed for cough. 50 mL Balian Schaller K, PA-C      PDMP not reviewed this encounter.   Terrilee Croak, PA-C 02/23/22 1342

## 2022-02-23 NOTE — ED Triage Notes (Signed)
The patient states his at home covid test was negative yesterday. The pt states he has a cough (last Saturday).

## 2022-02-23 NOTE — Discharge Instructions (Signed)
Start cefdinir twice daily.  Use Tessalon for daytime cough.  Use Promethazine DM at night.  This will make you sleepy so do not drive or drink alcohol with taking it.  Use Mucinex, Flonase, Tylenol for symptom relief.  Make sure you rest and drink plenty fluid.  If her symptoms are improving by next week please return for reevaluation.  If you have any worsening symptoms including chest pain, shortness of breath, headache, vision change, nausea, vomiting, fever you need to be seen immediately.

## 2022-02-27 DIAGNOSIS — M5451 Vertebrogenic low back pain: Secondary | ICD-10-CM | POA: Diagnosis not present

## 2022-03-15 ENCOUNTER — Other Ambulatory Visit: Payer: Self-pay | Admitting: Internal Medicine

## 2022-03-15 DIAGNOSIS — E1159 Type 2 diabetes mellitus with other circulatory complications: Secondary | ICD-10-CM

## 2022-03-30 ENCOUNTER — Other Ambulatory Visit: Payer: Self-pay | Admitting: Gastroenterology

## 2022-03-31 ENCOUNTER — Encounter: Payer: Self-pay | Admitting: Gastroenterology

## 2022-03-31 ENCOUNTER — Other Ambulatory Visit: Payer: Self-pay

## 2022-03-31 MED ORDER — OMEPRAZOLE 20 MG PO CPDR: 20.0000 mg | DELAYED_RELEASE_CAPSULE | Freq: Two times a day (BID) | ORAL | 3 refills | Status: DC

## 2022-04-07 DIAGNOSIS — E1159 Type 2 diabetes mellitus with other circulatory complications: Secondary | ICD-10-CM | POA: Diagnosis not present

## 2022-04-13 ENCOUNTER — Encounter: Payer: Self-pay | Admitting: Internal Medicine

## 2022-04-17 ENCOUNTER — Other Ambulatory Visit (INDEPENDENT_AMBULATORY_CARE_PROVIDER_SITE_OTHER): Payer: Medicare PPO

## 2022-04-17 ENCOUNTER — Ambulatory Visit (INDEPENDENT_AMBULATORY_CARE_PROVIDER_SITE_OTHER): Payer: Medicare PPO | Admitting: Emergency Medicine

## 2022-04-17 ENCOUNTER — Encounter: Payer: Self-pay | Admitting: Emergency Medicine

## 2022-04-17 VITALS — BP 134/72 | HR 78 | Temp 97.7°F | Ht 70.0 in | Wt 200.2 lb

## 2022-04-17 DIAGNOSIS — D509 Iron deficiency anemia, unspecified: Secondary | ICD-10-CM | POA: Diagnosis not present

## 2022-04-17 DIAGNOSIS — M545 Low back pain, unspecified: Secondary | ICD-10-CM | POA: Diagnosis not present

## 2022-04-17 DIAGNOSIS — I152 Hypertension secondary to endocrine disorders: Secondary | ICD-10-CM | POA: Diagnosis not present

## 2022-04-17 DIAGNOSIS — E1159 Type 2 diabetes mellitus with other circulatory complications: Secondary | ICD-10-CM

## 2022-04-17 DIAGNOSIS — L989 Disorder of the skin and subcutaneous tissue, unspecified: Secondary | ICD-10-CM

## 2022-04-17 LAB — CBC WITH DIFFERENTIAL/PLATELET
Basophils Absolute: 0 10*3/uL (ref 0.0–0.1)
Basophils Relative: 0.8 % (ref 0.0–3.0)
Eosinophils Absolute: 0.1 10*3/uL (ref 0.0–0.7)
Eosinophils Relative: 1.1 % (ref 0.0–5.0)
HCT: 44.3 % (ref 39.0–52.0)
Hemoglobin: 15 g/dL (ref 13.0–17.0)
Lymphocytes Relative: 22.5 % (ref 12.0–46.0)
Lymphs Abs: 1.3 10*3/uL (ref 0.7–4.0)
MCHC: 33.8 g/dL (ref 30.0–36.0)
MCV: 85.3 fl (ref 78.0–100.0)
Monocytes Absolute: 0.4 10*3/uL (ref 0.1–1.0)
Monocytes Relative: 6.8 % (ref 3.0–12.0)
Neutro Abs: 3.9 10*3/uL (ref 1.4–7.7)
Neutrophils Relative %: 68.8 % (ref 43.0–77.0)
Platelets: 165 10*3/uL (ref 150.0–400.0)
RBC: 5.2 Mil/uL (ref 4.22–5.81)
RDW: 14.7 % (ref 11.5–15.5)
WBC: 5.6 10*3/uL (ref 4.0–10.5)

## 2022-04-17 LAB — IBC + FERRITIN
Ferritin: 30.7 ng/mL (ref 22.0–322.0)
Iron: 66 ug/dL (ref 42–165)
Saturation Ratios: 13.9 % — ABNORMAL LOW (ref 20.0–50.0)
TIBC: 474.6 ug/dL — ABNORMAL HIGH (ref 250.0–450.0)
Transferrin: 339 mg/dL (ref 212.0–360.0)

## 2022-04-17 NOTE — Patient Instructions (Signed)
Health Maintenance After Age 65 After age 65, you are at a higher risk for certain long-term diseases and infections as well as injuries from falls. Falls are a major cause of broken bones and head injuries in people who are older than age 65. Getting regular preventive care can help to keep you healthy and well. Preventive care includes getting regular testing and making lifestyle changes as recommended by your health care provider. Talk with your health care provider about: Which screenings and tests you should have. A screening is a test that checks for a disease when you have no symptoms. A diet and exercise plan that is right for you. What should I know about screenings and tests to prevent falls? Screening and testing are the best ways to find a health problem early. Early diagnosis and treatment give you the best chance of managing medical conditions that are common after age 65. Certain conditions and lifestyle choices may make you more likely to have a fall. Your health care provider may recommend: Regular vision checks. Poor vision and conditions such as cataracts can make you more likely to have a fall. If you wear glasses, make sure to get your prescription updated if your vision changes. Medicine review. Work with your health care provider to regularly review all of the medicines you are taking, including over-the-counter medicines. Ask your health care provider about any side effects that may make you more likely to have a fall. Tell your health care provider if any medicines that you take make you feel dizzy or sleepy. Strength and balance checks. Your health care provider may recommend certain tests to check your strength and balance while standing, walking, or changing positions. Foot health exam. Foot pain and numbness, as well as not wearing proper footwear, can make you more likely to have a fall. Screenings, including: Osteoporosis screening. Osteoporosis is a condition that causes  the bones to get weaker and break more easily. Blood pressure screening. Blood pressure changes and medicines to control blood pressure can make you feel dizzy. Depression screening. You may be more likely to have a fall if you have a fear of falling, feel depressed, or feel unable to do activities that you used to do. Alcohol use screening. Using too much alcohol can affect your balance and may make you more likely to have a fall. Follow these instructions at home: Lifestyle Do not drink alcohol if: Your health care provider tells you not to drink. If you drink alcohol: Limit how much you have to: 0-1 drink a day for women. 0-2 drinks a day for men. Know how much alcohol is in your drink. In the [place] one drink equals one 12 oz bottle of beer (355 mL), one 5 oz glass of wine (148 mL), or one 1 oz glass of hard liquor (44 mL). Do not use any products that contain nicotine or tobacco. These products include cigarettes, chewing tobacco, and vaping devices, such as e-cigarettes. If you need help quitting, ask your health care provider. Activity  Follow a regular exercise program to stay fit. This will help you maintain your balance. Ask your health care provider what types of exercise are appropriate for you. If you need a cane or walker, use it as recommended by your health care provider. Wear supportive shoes that have nonskid soles. Safety  Remove any tripping hazards, such as rugs, cords, and clutter. Install safety equipment such as grab bars in bathrooms and safety rails on stairs. Keep rooms and walkways   well-lit. General instructions Talk with your health care provider about your risks for falling. Tell your health care provider if: You fall. Be sure to tell your health care provider about all falls, even ones that seem minor. You feel dizzy, tiredness (fatigue), or off-balance. Take over-the-counter and prescription medicines only as told by your health care provider. These include  supplements. Eat a healthy diet and maintain a healthy weight. A healthy diet includes low-fat dairy products, low-fat (lean) meats, and fiber from whole grains, beans, and lots of fruits and vegetables. Stay current with your vaccines. Schedule regular health, dental, and eye exams. Summary Having a healthy lifestyle and getting preventive care can help to protect your health and wellness after age 65. Screening and testing are the best way to find a health problem early and help you avoid having a fall. Early diagnosis and treatment give you the best chance for managing medical conditions that are more common for people who are older than age 65. Falls are a major cause of broken bones and head injuries in people who are older than age 65. Take precautions to prevent a fall at home. Work with your health care provider to learn what changes you can make to improve your health and wellness and to prevent falls. This information is not intended to replace advice given to you by your health care provider. Make sure you discuss any questions you have with your health care provider. Document Revised: 11/29/2020 Document Reviewed: 11/29/2020 [employer] Patient Education  2023 [employer]  

## 2022-04-17 NOTE — Progress Notes (Signed)
[name] Love 66 y.o.   Chief Complaint  Patient presents with   Mass    Left side of the neck behind the ear, painful to the touch     HISTORY OF PRESENT ILLNESS: This is a 66 y.o. male complaining of painful skin lesion left side of the neck for the past month. Diabetic and hypertensive.  HPI   Prior to Admission medications   Medication Sig Start Date End Date Taking? Authorizing Provider  atorvastatin (LIPITOR) 40 MG tablet Take 1 tablet by mouth once daily 10/07/21  Yes [name], MD  benzonatate (TESSALON) 100 MG capsule Take 1 capsule (100 mg total) by mouth every 8 (eight) hours. 02/23/22  Yes Raspet, Erin K, PA-C  cefdinir (OMNICEF) 300 MG capsule Take 1 capsule (300 mg total) by mouth 2 (two) times daily. 02/23/22  Yes Raspet, Erin K, PA-C  Continuous Blood Gluc Sensor (FREESTYLE LIBRE 14 DAY SENSOR) MISC USE SENSOR EVERY 14 DAYS. CHANGE SENSOR EVERY 2 WEEKS AS DIRECTED 09/08/21  Yes Philemon Kingdom, MD  empagliflozin (JARDIANCE) 25 MG TABS tablet Take 1 tablet (25 mg total) by mouth daily before breakfast. 02/06/22  Yes Philemon Kingdom, MD  losartan (COZAAR) 50 MG tablet Take 1 tablet (50 mg total) by mouth daily. 07/04/21  Yes SagardiaInes Bloomer, MD  Magnesium Glycinate 665 MG CAPS Take 1 tablet by mouth in the morning and at bedtime.   Yes [provider]  metFORMIN (GLUCOPHAGE-XR) 500 MG 24 hr tablet TAKE 2 TABLETS BY MOUTH IN THE MORNING AND AT BEDTIME 02/16/22  Yes Philemon Kingdom, MD  Omega-3 Fatty Acids (FISH OIL) 1000 MG CAPS Take 3 capsules (3,000 mg total) by mouth 2 (two) times daily. 08/19/19  Yes [name], MD  omeprazole (PRILOSEC) 20 MG capsule Take 1 capsule (20 mg total) by mouth 2 (two) times daily before a meal. 03/31/22  Yes Daryel November, MD  OZEMPIC, 1 MG/DOSE, 4 MG/3ML SOPN INJECT 1 MG INTO THE SKIN  ONCE A WEEK 03/15/22  Yes Philemon Kingdom, MD  POTASSIUM GLUCONATE PO Take 595 mg by mouth 2 (two) times  daily.   Yes [provider]  promethazine-dextromethorphan (PROMETHAZINE-DM) 6.25-15 MG/5ML syrup Take 5 mLs by mouth at bedtime as needed for cough. 02/23/22  Yes Raspet, Derry Skill, PA-C  aspirin EC 81 MG tablet Take 1 tablet (81 mg total) by mouth daily. Patient not taking: Reported on 04/17/2022 07/30/18   Imogene Burn, PA-C  FARXIGA 10 MG TABS tablet Take 1 tablet by mouth once daily Patient not taking: Reported on 04/17/2022 09/23/21   Philemon Kingdom, MD  naproxen sodium (ALEVE) 220 MG tablet Take 220 mg by mouth daily as needed. Patient not taking: Reported on 04/17/2022    [provider]  TURMERIC CURCUMIN PO Take by mouth. Patient not taking: Reported on 04/17/2022    [provider]    No Known Allergies  Patient Active Problem List   Diagnosis Date Noted   Skin lesion 04/17/2022   Lumbar radiculopathy 08/05/2021   Pain of left hip joint 07/07/2021   Degeneration of thoracic intervertebral disc 06/29/2020   Pain in thoracic spine 06/29/2020   Tendinitis of hip 10/09/2017   Decreased calculated GFR 08/08/2017   Bilateral adhesive capsulitis of shoulders 07/26/2017   Erectile dysfunction 09/15/2013   Nonspecific abnormal electrocardiogram (ECG) (EKG) 06/10/2013   Poorly controlled type 2 diabetes mellitus with circulatory disorder ([place]) 09/12/2012   Coronary artery disease 07/14/2011   History of  coronary artery bypass surgery 07/14/2011   Hyperlipidemia 02/23/2009   Obesity 02/23/2009   Hypertension associated with diabetes ([place]) 02/23/2009    Past Medical History:  Diagnosis Date   Anemia    Arthritis    Coronary artery disease    post CABG x3 in 2007   Diabetes mellitus    Hyperlipidemia    Hypertension    Kidney stone 1990   Obesity     Past Surgical History:  Procedure Laterality Date   CARDIAC CATHETERIZATION  EF= 45-50%   EF= 45-50% -- Three-vessel coronary artery disease with high-grade lesions in the proximal left anterior  descending artery and multiple lesions in the right coronary -- Low normal ejection fraction -- Stable exertional angina -- [name], M.D. [facility]   COLONOSCOPY  09/26/2021   CORONARY ARTERY BYPASS GRAFT  02/19/2006    x3 (left internal mammary artery to LAD, left radial artery to posterior descending, saphenous vein graft to first diagonal), endoscopic vein harvest right leg -- SURGEON:  [name]. [name], M.D.   HERNIA REPAIR     at 66 y/o   SHOULDER ARTHROSCOPY Right 2007   tripple bypass     UPPER GASTROINTESTINAL ENDOSCOPY      Social History   Socioeconomic History   Marital status: Married    Spouse name: Not on file   Number of children: 3   Years of education: Not on file   Highest education level: Not on file  Occupational History   Occupation: retired  Tobacco Use   Smoking status: Never   Smokeless tobacco: Never  Vaping Use   Vaping Use: Never used  Substance and Sexual Activity   Alcohol use: Yes    Alcohol/week: 1.0 standard drink of alcohol    Types: 1 Standard drinks or equivalent per week    Comment: 1 drink monthly   Drug use: Never   Sexual activity: Yes  Other Topics Concern   Not on file  Social History Narrative   Not on file   Social Determinants of Health   Financial Resource Strain: Not on file  Food Insecurity: Not on file  Transportation Needs: Not on file  Physical Activity: Not on file  Stress: Not on file  Social Connections: Not on file  Intimate Partner Violence: Not on file    Family History  Problem Relation Age of Onset   Heart disease Mother    Colon cancer Mother        45   Hypertension Mother    Cancer Father        type unknown   Kidney disease Maternal Aunt    Diabetes Maternal Uncle    Heart attack Maternal Grandfather 22   Colon polyps Neg Hx    Esophageal cancer Neg Hx    Rectal cancer Neg Hx    Stomach cancer Neg Hx      Review of Systems  Constitutional: Negative.  Negative for chills and fever.   HENT: Negative.  Negative for congestion and sore throat.   Respiratory: Negative.  Negative for cough and shortness of breath.   Cardiovascular: Negative.   Gastrointestinal:  Negative for abdominal pain, nausea and vomiting.  Genitourinary: Negative.   Skin: Negative.        Skin lesion  Neurological:  Negative for dizziness and headaches.  All other systems reviewed and are negative.  Today's Vitals   04/17/22 1318  BP: 134/72  Pulse: 78  Temp: 97.7 F (36.5 C)  TempSrc: Oral  SpO2: 97%  Weight: 200 lb 4 oz (90.8 kg)  Height: '5\' 10"'$  (1.778 m)   Body mass index is 28.73 kg/m.   Physical Exam Vitals reviewed.  Constitutional:      Appearance: Normal appearance.  HENT:     Head: Normocephalic.  Eyes:     Extraocular Movements: Extraocular movements intact.     Pupils: Pupils are equal, round, and reactive to light.  Cardiovascular:     Rate and Rhythm: Normal rate.  Pulmonary:     Effort: Pulmonary effort is normal.  Musculoskeletal:     Cervical back: No tenderness.  Lymphadenopathy:     Cervical: No cervical adenopathy.  Skin:    General: Skin is warm and dry.     Capillary Refill: Capillary refill takes less than 2 seconds.     Findings: Lesion present.  Neurological:     General: No focal deficit present.     Mental Status: He is alert and oriented to person, place, and time.         ASSESSMENT & PLAN: Problem List Items Addressed This Visit       Cardiovascular and Mediastinum   Hypertension associated with diabetes [facility])   Relevant Orders   Ambulatory referral to Dermatology     Musculoskeletal and Integument   Skin lesion - Primary    Progressively getting worse. Diabetic.  No signs of infection. Needs dermatology evaluation for possible excision and pathology report.      Relevant Orders   Ambulatory referral to Dermatology   Patient Instructions  Health Maintenance After Age 59 After age 68, you are at a higher risk for certain  long-term diseases and infections as well as injuries from falls. Falls are a major cause of broken bones and head injuries in people who are older than age 72. Getting regular preventive care can help to keep you healthy and well. Preventive care includes getting regular testing and making lifestyle changes as recommended by your health care provider. Talk with your health care provider about: Which screenings and tests you should have. A screening is a test that checks for a disease when you have no symptoms. A diet and exercise plan that is right for you. What should I know about screenings and tests to prevent falls? Screening and testing are the best ways to find a health problem early. Early diagnosis and treatment give you the best chance of managing medical conditions that are common after age 70. Certain conditions and lifestyle choices may make you more likely to have a fall. Your health care provider may recommend: Regular vision checks. Poor vision and conditions such as cataracts can make you more likely to have a fall. If you wear glasses, make sure to get your prescription updated if your vision changes. Medicine review. Work with your health care provider to regularly review all of the medicines you are taking, including over-the-counter medicines. Ask your health care provider about any side effects that may make you more likely to have a fall. Tell your health care provider if any medicines that you take make you feel dizzy or sleepy. Strength and balance checks. Your health care provider may recommend certain tests to check your strength and balance while standing, walking, or changing positions. Foot health exam. Foot pain and numbness, as well as not wearing proper footwear, can make you more likely to have a fall. Screenings, including: Osteoporosis screening. Osteoporosis is a condition that causes the bones to get weaker and break more  easily. Blood pressure screening. Blood  pressure changes and medicines to control blood pressure can make you feel dizzy. Depression screening. You may be more likely to have a fall if you have a fear of falling, feel depressed, or feel unable to do activities that you used to do. Alcohol use screening. Using too much alcohol can affect your balance and may make you more likely to have a fall. Follow these instructions at home: Lifestyle Do not drink alcohol if: Your health care provider tells you not to drink. If you drink alcohol: Limit how much you have to: 0-1 drink a day for women. 0-2 drinks a day for men. Know how much alcohol is in your drink. In the [place] one drink equals one 12 oz bottle of beer (355 mL), one 5 oz glass of wine (148 mL), or one 1 oz glass of hard liquor (44 mL). Do not use any products that contain nicotine or tobacco. These products include cigarettes, chewing tobacco, and vaping devices, such as e-cigarettes. If you need help quitting, ask your health care provider. Activity  Follow a regular exercise program to stay fit. This will help you maintain your balance. Ask your health care provider what types of exercise are appropriate for you. If you need a cane or walker, use it as recommended by your health care provider. Wear supportive shoes that have nonskid soles. Safety  Remove any tripping hazards, such as rugs, cords, and clutter. Install safety equipment such as grab bars in bathrooms and safety rails on stairs. Keep rooms and walkways well-lit. General instructions Talk with your health care provider about your risks for falling. Tell your health care provider if: You fall. Be sure to tell your health care provider about all falls, even ones that seem minor. You feel dizzy, tiredness (fatigue), or off-balance. Take over-the-counter and prescription medicines only as told by your health care provider. These include supplements. Eat a healthy diet and maintain a healthy weight. A healthy diet  includes low-fat dairy products, low-fat (lean) meats, and fiber from whole grains, beans, and lots of fruits and vegetables. Stay current with your vaccines. Schedule regular health, dental, and eye exams. Summary Having a healthy lifestyle and getting preventive care can help to protect your health and wellness after age 32. Screening and testing are the best way to find a health problem early and help you avoid having a fall. Early diagnosis and treatment give you the best chance for managing medical conditions that are more common for people who are older than age 104. Falls are a major cause of broken bones and head injuries in people who are older than age 44. Take precautions to prevent a fall at home. Work with your health care provider to learn what changes you can make to improve your health and wellness and to prevent falls. This information is not intended to replace advice given to you by your health care provider. Make sure you discuss any questions you have with your health care provider. Document Revised: 11/29/2020 Document Reviewed: 11/29/2020 Elsevier Patient Education  [place], MD [facility]

## 2022-04-17 NOTE — Assessment & Plan Note (Signed)
Progressively getting worse. Diabetic.  No signs of infection. Needs dermatology evaluation for possible excision and pathology report.

## 2022-04-18 ENCOUNTER — Encounter: Payer: Self-pay | Admitting: Emergency Medicine

## 2022-04-18 ENCOUNTER — Other Ambulatory Visit: Payer: Self-pay

## 2022-04-18 ENCOUNTER — Other Ambulatory Visit: Payer: Self-pay | Admitting: Specialist

## 2022-04-18 DIAGNOSIS — D509 Iron deficiency anemia, unspecified: Secondary | ICD-10-CM

## 2022-04-18 DIAGNOSIS — M545 Low back pain, unspecified: Secondary | ICD-10-CM

## 2022-04-18 NOTE — Progress Notes (Signed)
[name] Love, Your hemoglobin is well within normal range.  Your iron panel shows evidence of very mild iron deficiency, much improved from 9 months ago.  It is possible you may be having decreased iron absorption secondary to the omeprazole (iron absorption is best in an acidic environment).  Your iron supplement will be better absorbed if taken with something acidic, such as orange juice or a vitamin C tablet. I would recommend continuing to take it once a day as long as you are tolerating it. Lets repeat a CBC and iron panel in 4 months.

## 2022-04-21 DIAGNOSIS — D485 Neoplasm of uncertain behavior of skin: Secondary | ICD-10-CM | POA: Diagnosis not present

## 2022-04-21 DIAGNOSIS — L821 Other seborrheic keratosis: Secondary | ICD-10-CM | POA: Diagnosis not present

## 2022-04-21 DIAGNOSIS — L309 Dermatitis, unspecified: Secondary | ICD-10-CM | POA: Diagnosis not present

## 2022-04-21 DIAGNOSIS — C4442 Squamous cell carcinoma of skin of scalp and neck: Secondary | ICD-10-CM | POA: Diagnosis not present

## 2022-04-21 DIAGNOSIS — B351 Tinea unguium: Secondary | ICD-10-CM | POA: Diagnosis not present

## 2022-05-08 ENCOUNTER — Ambulatory Visit
Admission: RE | Admit: 2022-05-08 | Discharge: 2022-05-08 | Disposition: A | Discharge status: Home / Self Care | Payer: Medicare PPO | Source: Ambulatory Visit | Attending: Specialist | Admitting: Specialist

## 2022-05-08 ENCOUNTER — Other Ambulatory Visit: Payer: Self-pay | Admitting: Internal Medicine

## 2022-05-08 DIAGNOSIS — M5126 Other intervertebral disc displacement, lumbar region: Secondary | ICD-10-CM | POA: Diagnosis not present

## 2022-05-08 DIAGNOSIS — M545 Low back pain, unspecified: Secondary | ICD-10-CM

## 2022-05-08 DIAGNOSIS — M48061 Spinal stenosis, lumbar region without neurogenic claudication: Secondary | ICD-10-CM | POA: Diagnosis not present

## 2022-05-08 DIAGNOSIS — M5416 Radiculopathy, lumbar region: Secondary | ICD-10-CM | POA: Diagnosis not present

## 2022-05-08 MED ORDER — IOPAMIDOL (ISOVUE-M 200) INJECTION 41%
18.0000 mL | Freq: Once | INTRAMUSCULAR | Status: AC
  Administered 2022-05-08: 18 mL via INTRATHECAL

## 2022-05-08 MED ORDER — MEPERIDINE HCL 50 MG/ML IJ SOLN
50.0000 mg | Freq: Once | INTRAMUSCULAR | Status: AC | PRN
  Administered 2022-05-08: 50 mg via INTRAMUSCULAR

## 2022-05-08 MED ORDER — DIAZEPAM 5 MG PO TABS: 5.0000 mg | ORAL_TABLET | Freq: Once | ORAL | Status: DC

## 2022-05-08 MED ORDER — ONDANSETRON HCL 4 MG/2ML IJ SOLN
4.0000 mg | Freq: Once | INTRAMUSCULAR | Status: AC | PRN
  Administered 2022-05-08: 4 mg via INTRAMUSCULAR

## 2022-05-08 NOTE — Discharge Instructions (Signed)

## 2022-05-08 NOTE — Discharge Instr - Other Info (Addendum)
1110: pt reports pain 7/10 form myelogram procedure. See MAR.  1222: pt reports relief in pain. Pt denies any pain at this time.

## 2022-05-15 DIAGNOSIS — M545 Low back pain, unspecified: Secondary | ICD-10-CM | POA: Diagnosis not present

## 2022-05-15 DIAGNOSIS — M5136 Other intervertebral disc degeneration, lumbar region: Secondary | ICD-10-CM | POA: Diagnosis not present

## 2022-05-16 ENCOUNTER — Encounter: Payer: Self-pay | Admitting: Gastroenterology

## 2022-05-16 ENCOUNTER — Telehealth: Payer: Self-pay

## 2022-05-16 NOTE — Telephone Encounter (Signed)
Fax sent to requesting providers office asking for anesthesia information. Awaiting a fax back.

## 2022-05-16 NOTE — Telephone Encounter (Signed)
Last seen 07/2021. Needs virtual visit for clearance.   TY!  Loel Dubonnet, NP

## 2022-05-16 NOTE — Telephone Encounter (Signed)
S poke with the patient and informed that a telehealth visit was needed. Patient agreeable and voiced understanding.   Appt scheduled for Friday 05/19/22. Consent given and med list reviewed.

## 2022-05-16 NOTE — Telephone Encounter (Signed)
  Patient Consent for Virtual Visit        [name] Love has provided verbal consent on 05/16/2022 for a virtual visit (video or telephone).   CONSENT FOR VIRTUAL VISIT FOR:  [name] Love  By participating in this virtual visit I agree to the following:  I hereby voluntarily request, consent and authorize [facility] and its employed or contracted physicians, physician assistants, nurse practitioners or other licensed health care professionals (the Practitioner), to provide me with telemedicine health care services (the "Services") as deemed necessary by the treating Practitioner. I acknowledge and consent to receive the Services by the Practitioner via telemedicine. I understand that the telemedicine visit will involve communicating with the Practitioner through live audiovisual communication technology and the disclosure of certain medical information by electronic transmission. I acknowledge that I have been given the opportunity to request an in-person assessment or other available alternative prior to the telemedicine visit and am voluntarily participating in the telemedicine visit.  I understand that I have the right to withhold or withdraw my consent to the use of telemedicine in the course of my care at any time, without affecting my right to future care or treatment, and that the Practitioner or I may terminate the telemedicine visit at any time. I understand that I have the right to inspect all information obtained and/or recorded in the course of the telemedicine visit and may receive copies of available information for a reasonable fee.  I understand that some of the potential risks of receiving the Services via telemedicine include:  Delay or interruption in medical evaluation due to technological equipment failure or disruption; Information transmitted may not be sufficient (e.g. poor resolution of images) to allow for appropriate medical decision making by  the Practitioner; and/or  In rare instances, security protocols could fail, causing a breach of personal health information.  Furthermore, I acknowledge that it is my responsibility to provide information about my medical history, conditions and care that is complete and accurate to the best of my ability. I acknowledge that Practitioner's advice, recommendations, and/or decision may be based on factors not within their control, such as incomplete or inaccurate data provided by me or distortions of diagnostic images or specimens that may result from electronic transmissions. I understand that the practice of medicine is not an exact science and that Practitioner makes no warranties or guarantees regarding treatment outcomes. I acknowledge that a copy of this consent can be made available to me via my patient portal ([facility]), or I can request a printed copy by calling the office of [facility].    I understand that my insurance will be billed for this visit.   I have read or had this consent read to me. I understand the contents of this consent, which adequately explains the benefits and risks of the Services being provided via telemedicine.  I have been provided ample opportunity to ask questions regarding this consent and the Services and have had my questions answered to my satisfaction. I give my informed consent for the services to be provided through the use of telemedicine in my medical care

## 2022-05-16 NOTE — Telephone Encounter (Signed)
   Pre-operative Risk Assessment    Patient Name: [name] Love  DOB: 05-Mar-1956 MRN: [id number]      Request for Surgical Clearance    Procedure:   Lumbar Decompression  Date of Surgery:  Clearance TBD                                 Surgeon:  Dr Susa Day Surgeon's Group or Practice Name:  [facility] Phone number:  [phone]  Fax number:  [phone] Orson Slick)   Type of Clearance Requested:   - Medical    Type of Anesthesia:  Not Indicated   Additional requests/questions:  N/A  SignedUlice Brilliant T   05/16/2022, 7:29 AM

## 2022-05-16 NOTE — Telephone Encounter (Signed)
Left message for the patient to contact the office and peak with someone in the pre-op pool.

## 2022-05-19 ENCOUNTER — Ambulatory Visit (INDEPENDENT_AMBULATORY_CARE_PROVIDER_SITE_OTHER): Payer: Medicare PPO | Admitting: Family

## 2022-05-19 ENCOUNTER — Encounter (HOSPITAL_BASED_OUTPATIENT_CLINIC_OR_DEPARTMENT_OTHER): Payer: Self-pay | Admitting: Family

## 2022-05-19 DIAGNOSIS — Z01818 Encounter for other preprocedural examination: Secondary | ICD-10-CM | POA: Diagnosis not present

## 2022-05-19 NOTE — Progress Notes (Signed)
Virtual Visit via Telephone Note   Because of [name] Love's co-morbid illnesses, he is at least at moderate risk for complications without adequate follow up.  This format is felt to be most appropriate for this patient at this time.  The patient did not have access to video technology/had technical difficulties with video requiring transitioning to audio format only (telephone).  All issues noted in this document were discussed and addressed.  No physical exam could be performed with this format.  Please refer to the patient's chart for his consent to telehealth for [facility]   Date:  05/19/2022   ID:  [name] Love, DOB 12-15-1955, MRN [id number] The patient was identified using 2 identifiers.  Patient Location: Home Provider Location: Home Office   PCP:  [name] Love, [facility] Cardiologist:  [name] Grooms, MD     Evaluation Performed:  Follow-Up Visit  Chief Complaint:  Preop clearance  History of Present Illness:    [name] Love is a 66 y.o. male with CAD s/p CABGx3 2007 (lexiscan 2012 unremarkable), anemia, DM2, HTN, HLD, obesity. Last seen 08/22/21 by Dr. Irish Love doing well form cardiac perspective with known TWI to inferolateral leads on EKG and recommended to follow up in one year.   Presents today for preoperative clearance for lumbar decompression. Reports no shortness of breath nor dyspnea on exertion. Reports no chest pain, pressure, or tightness. No edema, orthopnea, PND. Reports no palpitations.  He is still golfing as well as bowling once per week. Exercise tolerance >4 METS.   Past Medical History:  Diagnosis Date   Anemia    Arthritis    Coronary artery disease    post CABG x3 in 2007   Diabetes mellitus    Hyperlipidemia    Hypertension    Kidney stone 1990   Obesity    Past Surgical History:  Procedure Laterality Date   CARDIAC CATHETERIZATION  EF= 45-50%   EF=  45-50% -- Three-vessel coronary artery disease with high-grade lesions in the proximal left anterior descending artery and multiple lesions in the right coronary -- Low normal ejection fraction -- Stable exertional angina -- [name] Love, M.D. [facility]   COLONOSCOPY  09/26/2021   CORONARY ARTERY BYPASS GRAFT  02/19/2006    x3 (left internal mammary artery to LAD, left radial artery to posterior descending, saphenous vein graft to first diagonal), endoscopic vein harvest right leg -- SURGEON:  [name] Love. [name] Love, M.D.   HERNIA REPAIR     at 66 y/o   SHOULDER ARTHROSCOPY Right 2007   tripple bypass     UPPER GASTROINTESTINAL ENDOSCOPY       Current Meds  Medication Sig   ascorbic acid (VITAMIN C) 500 MG tablet Take 500 mg by mouth daily.   atorvastatin (LIPITOR) 40 MG tablet Take 1 tablet by mouth once daily   Continuous Blood Gluc Sensor (FREESTYLE LIBRE 14 DAY SENSOR) MISC USE SENSOR EVERY 14 DAYS. CHANGE SENSOR EVERY 2 WEEKS AS DIRECTED   empagliflozin (JARDIANCE) 25 MG TABS tablet Take 1 tablet (25 mg total) by mouth daily before breakfast.   ferrous sulfate 324 MG TBEC Take 324 mg by mouth daily with breakfast.   losartan (COZAAR) 50 MG tablet Take 1 tablet (50 mg total) by mouth daily.   Magnesium Glycinate 665 MG CAPS Take 1 tablet by mouth in the morning and at bedtime.   metFORMIN (GLUCOPHAGE-XR) 500 MG 24 hr tablet TAKE 2 TABLETS BY MOUTH IN THE MORNING  AND 2 AT BEDTIME   Omega-3 Fatty Acids (FISH OIL) 1000 MG CAPS Take 3 capsules (3,000 mg total) by mouth 2 (two) times daily.   omeprazole (PRILOSEC) 20 MG capsule Take 1 capsule (20 mg total) by mouth 2 (two) times daily before a meal.   OZEMPIC, 1 MG/DOSE, 4 MG/3ML SOPN INJECT 1 MG INTO THE SKIN  ONCE A WEEK   POTASSIUM GLUCONATE PO Take 595 mg by mouth 2 (two) times daily.     Allergies:   Patient has no known allergies.   Social History   Tobacco Use   Smoking status: Never   Smokeless tobacco: Never  Vaping Use    Vaping Use: Never used  Substance Use Topics   Alcohol use: Yes    Alcohol/week: 1.0 Love drink of alcohol    Types: 1 Love drinks or equivalent per week    Comment: 1 drink monthly   Drug use: Never     Family Hx: The patient's family history includes Cancer in his father; Colon cancer in his mother; Diabetes in his maternal uncle; Heart attack (age of onset: 73) in his maternal grandfather; Heart disease in his mother; Hypertension in his mother; Kidney disease in his maternal aunt. There is no history of Colon polyps, Esophageal cancer, Rectal cancer, or Stomach cancer.  ROS:   Please see the history of present illness.     All other systems reviewed and are negative.  Prior CV studies:   The following studies were reviewed today:  06/2013 echocardiogram  - Left ventricle: The cavity size was normal. There was mild    focal basal hypertrophy of the septum. Systolic function    was normal. The estimated ejection fraction was in the    range of 55% to 60%. Wall motion was normal; there were no    regional wall motion abnormalities.  - Left atrium: The atrium was mildly dilated.   Labs/Other Tests and Data Reviewed:    EKG:  No ECG reviewed.  Recent Labs: 07/04/2021: ALT 15; BUN 13; Creatinine, Ser 1.03; Potassium 4.2; Sodium 141 09/19/2021: TSH 1.67 04/17/2022: Hemoglobin 15.0; Platelets 165.0   Recent Lipid Panel Lab Results  Component Value Date/Time   CHOL 87 07/04/2021 11:54 AM   CHOL 91 (L) 06/24/2020 09:30 AM   TRIG 152.0 (H) 07/04/2021 11:54 AM   HDL 31.60 (L) 07/04/2021 11:54 AM   HDL 29 (L) 06/24/2020 09:30 AM   CHOLHDL 3 07/04/2021 11:54 AM   LDLCALC 25 07/04/2021 11:54 AM   LDLCALC 27 06/24/2020 09:30 AM   LDLDIRECT 51 06/21/2017 10:36 AM    Wt Readings from Last 3 Encounters:  04/17/22 200 lb 4 oz (90.8 kg)  02/06/22 202 lb 12.8 oz (92 kg)  12/07/21 215 lb (97.5 kg)         Objective:    Vital Signs:  No vitals available for review.    ASSESSMENT & PLAN:    Preoperative clearance - Able to achieve >4 METS. Per AHA/ACC guidelines, he is deemed acceptable risk for the planned procedure without additional cardiovascular testing. Will route to surgical team so they are aware.   Antiplatelet/Anticoagulant: He is not taking antiplatelet nor anticoagulant that would need held prior to procedure. (Aspirin presently on hold per GI). Instructed to hold fish oil one week prior to planned procedure.    Time:   Today, I have spent 7 minutes with the patient with telehealth technology discussing the above problems.     Medication Adjustments/Labs and  Tests Ordered: Current medicines are reviewed at length with the patient today.  Concerns regarding medicines are outlined above.   Tests Ordered: No orders of the defined types were placed in this encounter.   Medication Changes: No orders of the defined types were placed in this encounter.   Follow Up:  In Person  08/2022 with Dr. Irish Love as scheduled  Signed, Loel Dubonnet, NP  05/19/2022 10:12 AM    [place]

## 2022-05-19 NOTE — Patient Instructions (Signed)
Medication Instructions:  Your physician has recommended you make the following change in your medication:   Please hold fish oil for one week prior to planned procedure.  Would recommend resuming Aspirin when given the okay by gastroenterology. Would recommend not resuming prior to hip surgery.    *If you need a refill on your cardiac medications before your next appointment, please call your pharmacy*   Lab Work/Testing/Procedures: None ordered today.    Follow-Up: At [facility], you and your health needs are our priority.  As part of our continuing mission to provide you with exceptional heart care, we have created designated Provider Care Teams.  These Care Teams include your primary Cardiologist (physician) and Advanced Practice Providers (APPs -  Physician Assistants and Nurse Practitioners) who all work together to provide you with the care you need, when you need it.  We recommend signing up for the patient portal called "MyChart".  Sign up information is provided on this After Visit Summary.  MyChart is used to connect with patients for Virtual Visits (Telemedicine).  Patients are able to view lab/test results, encounter notes, upcoming appointments, etc.  Non-urgent messages can be sent to your provider as well.   To learn more about what you can do with MyChart, go to [email or web address].    Your next appointment:   As scheduled 09/01/22 with Dr. Irish Lack  Other Instructions  We have sent a note to your surgical team that you are clear for your planned procedure.   Heart Healthy Diet Recommendations: A low-salt diet is recommended. Meats should be grilled, baked, or boiled. Avoid fried foods. Focus on lean protein sources like fish or chicken with vegetables and fruits. The American Heart Association is a Microbiologist!  American Heart Association Diet and Lifeystyle Recommendations   Exercise recommendations: The American Heart Association recommends 150  minutes of moderate intensity exercise weekly. Try 30 minutes of moderate intensity exercise 4-5 times per week. This could include walking, jogging, or swimming.   Important Information About Sugar

## 2022-05-21 DIAGNOSIS — M47816 Spondylosis without myelopathy or radiculopathy, lumbar region: Secondary | ICD-10-CM | POA: Insufficient documentation

## 2022-05-22 ENCOUNTER — Ambulatory Visit: Payer: Self-pay | Admitting: Orthopedic Surgery

## 2022-05-22 DIAGNOSIS — M48062 Spinal stenosis, lumbar region with neurogenic claudication: Secondary | ICD-10-CM

## 2022-05-22 MED ORDER — ACETAMINOPHEN 10 MG/ML IV SOLN: 1000.0000 mg | INTRAVENOUS | Status: AC

## 2022-05-22 NOTE — H&P (Signed)
[name] Love is an 66 y.o. male.   Chief Complaint: back and leg pain HPI: Reason for Visit: (normal) review of test results (lumbar CT myelogram results); (normal) visit for: (back) Location (Lower Extremity): lower back pain Severity: pain level 5/10; moderate Quality: aching Aggravating Factors: standing for ; walking for Alleviating Factors: OTC medication (tylneol); other medication (robaxin) Associated Symptoms: numbness/tingling Medications: helping a little; prescribed robaxin tylenol twice a day  Past Medical History:  Diagnosis Date   Anemia    Arthritis    Coronary artery disease    post CABG x3 in 2007   Diabetes mellitus    Hyperlipidemia    Hypertension    Kidney stone 1990   Obesity     Past Surgical History:  Procedure Laterality Date   CARDIAC CATHETERIZATION  EF= 45-50%   EF= 45-50% -- Three-vessel coronary artery disease with high-grade lesions in the proximal left anterior descending artery and multiple lesions in the right coronary -- Low normal ejection fraction -- Stable exertional angina -- [name], M.D. [facility]   COLONOSCOPY  09/26/2021   CORONARY ARTERY BYPASS GRAFT  02/19/2006    x3 (left internal mammary artery to LAD, left radial artery to posterior descending, saphenous vein graft to first diagonal), endoscopic vein harvest right leg -- SURGEON:  [name]. [name], M.D.   HERNIA REPAIR     at 66 y/o   SHOULDER ARTHROSCOPY Right 2007   tripple bypass     UPPER GASTROINTESTINAL ENDOSCOPY      Family History  Problem Relation Age of Onset   Heart disease Mother    Colon cancer Mother        16   Hypertension Mother    Cancer Father        type unknown   Kidney disease Maternal Aunt    Diabetes Maternal Uncle    Heart attack Maternal Grandfather 80   Colon polyps Neg Hx    Esophageal cancer Neg Hx    Rectal cancer Neg Hx    Stomach cancer Neg Hx    Social History:  reports that he has never smoked. He has never used  smokeless tobacco. He reports current alcohol use of about 1.0 standard drink of alcohol per week. He reports that he does not use drugs.  Allergies: No Known Allergies  Current meds:  Reviewed Medications atorvastatin 40 mg tablet benzonatate 100 mg capsule cefdinir 300 mg capsule Fish OiL 1,000 mg (120 mg-180 mg) capsule FreeStyle Libre 14 Day Sensor kit Jardiance 25 mg tablet lisinopriL 10 mg tablet losartan 50 mg tablet metFORMIN ER 500 mg tablet,extended release 24 hr methocarbamoL 500 mg tablet omeprazole 20 mg capsule,delayed release Ozempic 1 mg/dose (4 mg/3 mL) subcutaneous pen injector sodium,potassium,mag sulfates 17.5 gram-3.13 gram-1.6 gram oral soln triamcinolone acetonide 0.1 % topical cream  Review of Systems  Constitutional: Negative.   HENT: Negative.    Eyes: Negative.   Respiratory: Negative.    Cardiovascular: Negative.   Gastrointestinal: Negative.   Endocrine: Negative.   Genitourinary: Negative.   Musculoskeletal:  Positive for back pain and gait problem.  Skin: Negative.   Neurological:  Positive for weakness and numbness.  Psychiatric/Behavioral: Negative.      There were no vitals taken for this visit. Physical Exam Constitutional:      Appearance: Normal appearance.  HENT:     Head: Normocephalic and atraumatic.     Right Ear: External ear normal.     Left Ear: External ear normal.  Nose: Nose normal.     Mouth/Throat:     Pharynx: Oropharynx is clear.  Eyes:     Conjunctiva/sclera: Conjunctivae normal.  Cardiovascular:     Rate and Rhythm: Normal rate.     Pulses: Normal pulses.     Heart sounds: Normal heart sounds.  Pulmonary:     Effort: Pulmonary effort is normal.  Abdominal:     General: Bowel sounds are normal.  Musculoskeletal:     Cervical back: Normal range of motion.     Comments: Straight leg raise on left is buttock thigh and calf pain is negative on the right. Motor is 5/5 lower extremities. Sensory exam is  intact. No Babinski clonus.  Skin:    General: Skin is warm and dry.  Neurological:     Mental Status: He is alert.    CT myelogram demonstrates moderate lateral recess stenosis at L5-S1 which is the open disc space above the transitional segment.  Here we have designated that this space is L4-5 where he has had epidural injections.  Assessment/Plan Impression:  1. Left lower extremity radicular pain secondary lateral recess stenosis due to facet hypertrophy minimal offset at L5-S1 refractory rest activity modification home exercise program and epidural steroid injections 2. 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 Facet arthropathy at L5-S1  Plan:  We discussed options concerning pathology relevant anatomy treatment options. For symptoms left lower extremity radicular pain he does have a moderate lateral recess stenosis. 1 option may be a lateral recess decompression by hemilaminotomy and foraminotomies on the left. Bilaterally if he develops right sided symptoms. I have indicated though that this would not address back pain back pain would only be addressed potentially by a fusion. I would recommend that as a last resort.  Following a decompression he may be a candidate for RFA to control back pain.  I had an extensive discussion with the patient concerning the pathology relevant anatomy and treatment options. At this point exhausting conservative treatment and in the presence of a neurologic deficit we discussed microlumbar decompression. I discussed the risks and benefits including bleeding, infection, DVT, PE, anesthetic complications, worsening in their symptoms, improvement in their symptoms, C SF leakage, epidural fibrosis, need for future surgeries such as revision discectomy and lumbar fusion. I also indicated that this is an operation to basically decompress the nerve roots to allow recovery as opposed to fixing a  herniated disc if it is encountered and that the incidence of recurrent chest disc herniation can approach 15%. Also that nerve root recovery is variable and may not recover completely. Any ligament or bone that is contributing to compressing the nerves will be removed as well.  I discussed the operative course including overnight in the hospital. Immediate ambulation. Follow-up in 2 weeks for suture removal. 6 weeks until healing of the herniation and surgical incision followed by 6 weeks of reconditioning and strengthening of the core musculature. Also discussed the need to employ the concepts of disc pressure management and core motion following the surgery to minimize the risk of recurrent disc herniation. We will obtain preoperative clearance i if necessary and proceed accordingly.  No history of DVT or MRSA. Preoperative clearance.  Plan micro hemilaminectomy L5-S1 left  Cecilie Kicks, PA-C for Dr Tonita Cong 05/22/2022, 1:31 PM

## 2022-05-22 NOTE — H&P (View-Only) (Signed)
[name] Love is an 66 y.o. male.   Chief Complaint: back and leg pain HPI: Reason for Visit: (normal) review of test results (lumbar CT myelogram results); (normal) visit for: (back) Location (Lower Extremity): lower back pain Severity: pain level 5/10; moderate Quality: aching Aggravating Factors: standing for ; walking for Alleviating Factors: OTC medication (tylneol); other medication (robaxin) Associated Symptoms: numbness/tingling Medications: helping a little; prescribed robaxin tylenol twice a day  Past Medical History:  Diagnosis Date   Anemia    Arthritis    Coronary artery disease    post CABG x3 in 2007   Diabetes mellitus    Hyperlipidemia    Hypertension    Kidney stone 1990   Obesity     Past Surgical History:  Procedure Laterality Date   CARDIAC CATHETERIZATION  EF= 45-50%   EF= 45-50% -- Three-vessel coronary artery disease with high-grade lesions in the proximal left anterior descending artery and multiple lesions in the right coronary -- Low normal ejection fraction -- Stable exertional angina -- [name], M.D. [facility]   COLONOSCOPY  09/26/2021   CORONARY ARTERY BYPASS GRAFT  02/19/2006    x3 (left internal mammary artery to LAD, left radial artery to posterior descending, saphenous vein graft to first diagonal), endoscopic vein harvest right leg -- SURGEON:  [name]. [name], M.D.   HERNIA REPAIR     at 66 y/o   SHOULDER ARTHROSCOPY Right 2007   tripple bypass     UPPER GASTROINTESTINAL ENDOSCOPY      Family History  Problem Relation Age of Onset   Heart disease Mother    Colon cancer Mother        67   Hypertension Mother    Cancer Father        type unknown   Kidney disease Maternal Aunt    Diabetes Maternal Uncle    Heart attack Maternal Grandfather 59   Colon polyps Neg Hx    Esophageal cancer Neg Hx    Rectal cancer Neg Hx    Stomach cancer Neg Hx    Social History:  reports that he has never smoked. He has never used  smokeless tobacco. He reports current alcohol use of about 1.0 standard drink of alcohol per week. He reports that he does not use drugs.  Allergies: No Known Allergies  Current meds:  Reviewed Medications atorvastatin 40 mg tablet benzonatate 100 mg capsule cefdinir 300 mg capsule Fish OiL 1,000 mg (120 mg-180 mg) capsule FreeStyle Libre 14 Day Sensor kit Jardiance 25 mg tablet lisinopriL 10 mg tablet losartan 50 mg tablet metFORMIN ER 500 mg tablet,extended release 24 hr methocarbamoL 500 mg tablet omeprazole 20 mg capsule,delayed release Ozempic 1 mg/dose (4 mg/3 mL) subcutaneous pen injector sodium,potassium,mag sulfates 17.5 gram-3.13 gram-1.6 gram oral soln triamcinolone acetonide 0.1 % topical cream  Review of Systems  Constitutional: Negative.   HENT: Negative.    Eyes: Negative.   Respiratory: Negative.    Cardiovascular: Negative.   Gastrointestinal: Negative.   Endocrine: Negative.   Genitourinary: Negative.   Musculoskeletal:  Positive for back pain and gait problem.  Skin: Negative.   Neurological:  Positive for weakness and numbness.  Psychiatric/Behavioral: Negative.      There were no vitals taken for this visit. Physical Exam Constitutional:      Appearance: Normal appearance.  HENT:     Head: Normocephalic and atraumatic.     Right Ear: External ear normal.     Left Ear: External ear normal.  Nose: Nose normal.     Mouth/Throat:     Pharynx: Oropharynx is clear.  Eyes:     Conjunctiva/sclera: Conjunctivae normal.  Cardiovascular:     Rate and Rhythm: Normal rate.     Pulses: Normal pulses.     Heart sounds: Normal heart sounds.  Pulmonary:     Effort: Pulmonary effort is normal.  Abdominal:     General: Bowel sounds are normal.  Musculoskeletal:     Cervical back: Normal range of motion.     Comments: Straight leg raise on left is buttock thigh and calf pain is negative on the right. Motor is 5/5 lower extremities. Sensory exam is  intact. No Babinski clonus.  Skin:    General: Skin is warm and dry.  Neurological:     Mental Status: He is alert.    CT myelogram demonstrates moderate lateral recess stenosis at L5-S1 which is the open disc space above the transitional segment.  Here we have designated that this space is L4-5 where he has had epidural injections.  Assessment/Plan Impression:  1. Left lower extremity radicular pain secondary lateral recess stenosis due to facet hypertrophy minimal offset at L5-S1 refractory rest activity modification home exercise program and epidural steroid injections 2. 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 Facet arthropathy at L5-S1  Plan:  We discussed options concerning pathology relevant anatomy treatment options. For symptoms left lower extremity radicular pain he does have a moderate lateral recess stenosis. 1 option may be a lateral recess decompression by hemilaminotomy and foraminotomies on the left. Bilaterally if he develops right sided symptoms. I have indicated though that this would not address back pain back pain would only be addressed potentially by a fusion. I would recommend that as a last resort.  Following a decompression he may be a candidate for RFA to control back pain.  I had an extensive discussion with the patient concerning the pathology relevant anatomy and treatment options. At this point exhausting conservative treatment and in the presence of a neurologic deficit we discussed microlumbar decompression. I discussed the risks and benefits including bleeding, infection, DVT, PE, anesthetic complications, worsening in their symptoms, improvement in their symptoms, C SF leakage, epidural fibrosis, need for future surgeries such as revision discectomy and lumbar fusion. I also indicated that this is an operation to basically decompress the nerve roots to allow recovery as opposed to fixing a  herniated disc if it is encountered and that the incidence of recurrent chest disc herniation can approach 15%. Also that nerve root recovery is variable and may not recover completely. Any ligament or bone that is contributing to compressing the nerves will be removed as well.  I discussed the operative course including overnight in the hospital. Immediate ambulation. Follow-up in 2 weeks for suture removal. 6 weeks until healing of the herniation and surgical incision followed by 6 weeks of reconditioning and strengthening of the core musculature. Also discussed the need to employ the concepts of disc pressure management and core motion following the surgery to minimize the risk of recurrent disc herniation. We will obtain preoperative clearance i if necessary and proceed accordingly.  No history of DVT or MRSA. Preoperative clearance.  Plan micro hemilaminectomy L5-S1 left  Cecilie Kicks, PA-C for Dr Tonita Cong 05/22/2022, 1:31 PM

## 2022-05-25 DIAGNOSIS — C4442 Squamous cell carcinoma of skin of scalp and neck: Secondary | ICD-10-CM | POA: Diagnosis not present

## 2022-05-30 ENCOUNTER — Telehealth: Payer: Self-pay

## 2022-05-30 NOTE — Telephone Encounter (Signed)
Tried to call patient back to reschedule his EGD but had to leave a message to call us back.

## 2022-05-31 ENCOUNTER — Telehealth: Payer: Self-pay

## 2022-05-31 ENCOUNTER — Encounter (HOSPITAL_COMMUNITY): Payer: Self-pay

## 2022-05-31 NOTE — Pre-Procedure Instructions (Signed)
PCP - Dr [name] [name] Cardiologist - Dr Larae Grooms  Chest x-ray - n/a Lumbar x-ray -  06/01/22 EKG - 1/3/023 Stress Test - 2007 ECHO - 06/30/13 Cardiac Cath - 01/2006  ICD Pacemaker/Loop - none  Sleep Study -  n/a CPAP - none  Diabetes - Patient has [employer].  Sensor located on Left upper arm.  Do not take Metformin on the morning of surgery.  Do not take Jardiance for 72 hours prior to surgery.  Last dose will be on 06/04/22.  Last dose of weekly Ozempic was on 05/30/22.  If your blood sugar is less than 70 mg/dL, you will need to treat for low blood sugar: Treat a low blood sugar (less than 70 mg/dL) with  cup of clear juice (cranberry or apple), 4 glucose tablets, OR glucose gel. Recheck blood sugar in 15 minutes after treatment (to make sure it is greater than 70 mg/dL). If your blood sugar is not greater than 70 mg/dL on recheck, call [phone] for further instructions.  ERAS: Clear liquids til 8:15 am DOS.  Anesthesia review: Yes  STOP now taking any Aspirin (unless otherwise instructed by your surgeon), Aleve, Naproxen, Ibuprofen, Motrin, Advil, Goody's, BC's, all herbal medications, fish oil, and all vitamins.   Coronavirus Screening Do you have any of the following symptoms:  Cough yes/no: No Fever (>100.96F)  yes/no: No Runny nose yes/no: No Sore throat yes/no: No Difficulty breathing/shortness of breath  yes/no: No  Have you traveled in the last 14 days and where? yes/no: No  Patient verbalized understanding of instructions that were given to them at the PAT appointment. Patient was also instructed that they will need to review over the PAT instructions again at home before surgery.   '

## 2022-05-31 NOTE — Telephone Encounter (Signed)
Called patient to try and reschedule his EGD. Left message and a return phone number for him to call back to reschedule this.

## 2022-05-31 NOTE — Progress Notes (Signed)
Surgical Instructions    Your procedure is scheduled on Thursday November 16.  Report to [facility] Main Entrance "A" at 9:15 A.M., then check in with the Admitting office.  Call this number if you have problems the morning of surgery:  [phone]   If you have any questions prior to your surgery date call [phone]: Open Monday-Friday 8am-4pm If you experience any cold or flu symptoms such as cough, fever, chills, shortness of breath, etc. between now and your scheduled surgery, please notify us at the above number     Remember:  Do not eat after midnight the night before your surgery  You may drink clear liquids until 08:15AM the morning of your surgery.   Clear liquids allowed are: Water, Non-Citrus Juices (without pulp), Carbonated Beverages, Clear Tea, Black Coffee ONLY (NO MILK, CREAM OR POWDERED CREAMER of any kind), and Gatorade  Patient Instructions  The night before surgery:  No food after midnight. ONLY clear liquids after midnight   The day of surgery (if you have diabetes): Drink ONE (1) 12 oz G2 given to you in your pre admission testing appointment by 8:15AM the morning of surgery. Drink in one sitting. Do not sip.  This drink was given to you during your hospital  pre-op appointment visit.  Nothing else to drink after completing the  12 oz bottle of G2.         If you have questions, please contact your surgeon's office.     Take these medicines the morning of surgery with A SIP OF WATER:  atorvastatin (LIPITOR)  omeprazole (PRILOSEC)  acetaminophen (TYLENOL)  if needed   As of today, STOP taking any Aspirin (unless otherwise instructed by your surgeon) Aleve, Naproxen, Ibuprofen, Motrin, Advil, Goody's, BC's, all herbal medications, fish oil, and all vitamins.  WHAT DO I DO ABOUT MY DIABETES MEDICATION?   Do not take oral diabetes medicines (pills) the morning of surgery. DO NOT TAKE Metformin (Glucophage) the day of surgery.   DO NOT TAKE  empagliflozin (JARDIANCE) for 72 hours prior to surgery. Last dose 06/04/22.   DO NOT TAKE Ozempic for 7 days prior to surgery. DO NOT TAKE Ozempic after 05/31/22.    The day of surgery, do not take other diabetes injectables, including Byetta (exenatide), Bydureon (exenatide ER), Victoza (liraglutide), or Trulicity (dulaglutide).  If your CBG is greater than 220 mg/dL, you may take  of your sliding scale (correction) dose of insulin.   HOW TO MANAGE YOUR DIABETES BEFORE AND AFTER SURGERY  Why is it important to control my blood sugar before and after surgery? Improving blood sugar levels before and after surgery helps healing and can limit problems. A way of improving blood sugar control is eating a healthy diet by:  Eating less sugar and carbohydrates  Increasing activity/exercise  Talking with your doctor about reaching your blood sugar goals High blood sugars (greater than 180 mg/dL) can raise your risk of infections and slow your recovery, so you will need to focus on controlling your diabetes during the weeks before surgery. Make sure that the doctor who takes care of your diabetes knows about your planned surgery including the date and location.  How do I manage my blood sugar before surgery? Check your blood sugar at least 4 times a day, starting 2 days before surgery, to make sure that the level is not too high or low.  Check your blood sugar the morning of your surgery when you wake up and every 2 hours  until you get to the Short Stay unit.  If your blood sugar is less than 70 mg/dL, you will need to treat for low blood sugar: Do not take insulin. Treat a low blood sugar (less than 70 mg/dL) with  cup of clear juice (cranberry or apple), 4 glucose tablets, OR glucose gel. Recheck blood sugar in 15 minutes after treatment (to make sure it is greater than 70 mg/dL). If your blood sugar is not greater than 70 mg/dL on recheck, call [phone] for further instructions. Report  your blood sugar to the short stay nurse when you get to Short Stay.  If you are admitted to the hospital after surgery: Your blood sugar will be checked by the staff and you will probably be given insulin after surgery (instead of oral diabetes medicines) to make sure you have good blood sugar levels. The goal for blood sugar control after surgery is 80-180 mg/dL.         [facility] is not responsible for any belongings or valuables.    Do NOT Smoke (Tobacco/Vaping)  24 hours prior to your procedure  If you use a CPAP at night, you may bring your mask for your overnight stay.   Contacts, glasses, hearing aids, dentures or partials may not be worn into surgery, please bring cases for these belongings   For patients admitted to the hospital, discharge time will be determined by your treatment team.   Patients discharged the day of surgery will not be allowed to drive home, and someone needs to stay with them for 24 hours.   SURGICAL WAITING ROOM VISITATION Patients having surgery or a procedure may have no more than 2 support people in the waiting area - these visitors may rotate.   Children under the age of 45 must have an adult with them who is not the patient. If the patient needs to stay at the hospital during part of their recovery, the visitor guidelines for inpatient rooms apply. Pre-op nurse will coordinate an appropriate time for 1 support person to accompany patient in pre-op.  This support person may not rotate.   Please refer to [email or web address] for the visitor guidelines for Inpatients (after your surgery is over and you are in a regular room).    Special instructions:    Oral Hygiene is also important to reduce your risk of infection.  Remember - BRUSH YOUR TEETH THE MORNING OF SURGERY WITH YOUR REGULAR TOOTHPASTE   [facility]- Preparing For Surgery  Before surgery, you can play an important role. Because skin is  not sterile, your skin needs to be as free of germs as possible. You can reduce the number of germs on your skin by washing with CHG (chlorahexidine gluconate) Soap before surgery.  CHG is an antiseptic cleaner which kills germs and bonds with the skin to continue killing germs even after washing.     Please do not use if you have an allergy to CHG or antibacterial soaps. If your skin becomes reddened/irritated stop using the CHG.  Do not shave (including legs and underarms) for at least 48 hours prior to first CHG shower. It is OK to shave your face.  Please follow these instructions carefully.     Shower the NIGHT BEFORE SURGERY and the MORNING OF SURGERY with CHG Soap.   If you chose to wash your hair, wash your hair first as usual with your normal shampoo. After you shampoo, rinse your hair and body thoroughly to remove the shampoo.  Then [employer] and genitals (private parts) with your normal soap and rinse thoroughly to remove soap.  After that Use CHG Soap as you would any other liquid soap. You can apply CHG directly to the skin and wash gently with a scrungie or a clean washcloth.   Apply the CHG Soap to your body ONLY FROM THE NECK DOWN.  Do not use on open wounds or open sores. Avoid contact with your eyes, ears, mouth and genitals (private parts). Wash Face and genitals (private parts)  with your normal soap.   Wash thoroughly, paying special attention to the area where your surgery will be performed.  Thoroughly rinse your body with warm water from the neck down.  DO NOT shower/wash with your normal soap after using and rinsing off the CHG Soap.  Pat yourself dry with a CLEAN TOWEL.  Wear CLEAN PAJAMAS to bed the night before surgery  Place CLEAN SHEETS on your bed the night before your surgery  DO NOT SLEEP WITH PETS.   Day of Surgery:  Take a shower with CHG soap. Wear Clean/Comfortable clothing the morning of surgery    Do not wear jewelry or makeup. Do not wear  lotions, powders, perfumes/cologne or deodorant. Do not shave 48 hours prior to surgery.  Men may shave face and neck. Do not bring valuables to the hospital. Do not wear nail polish, gel polish, artificial nails, or any other type of covering on natural nails (fingers and toes) If you have artificial nails or gel coating that need to be removed by a nail salon, please have this removed prior to surgery. Artificial nails or gel coating may interfere with anesthesia's ability to adequately monitor your vital signs. Remember to brush your teeth WITH YOUR REGULAR TOOTHPASTE.    If you received a COVID test during your pre-op visit, it is requested that you wear a mask when out in public, stay away from anyone that may not be feeling well, and notify your surgeon if you develop symptoms. If you have been in contact with anyone that has tested positive in the last 10 days, please notify your surgeon.    Please read over the following fact sheets that you were given.

## 2022-06-01 ENCOUNTER — Encounter (HOSPITAL_COMMUNITY): Payer: Self-pay

## 2022-06-01 ENCOUNTER — Encounter (HOSPITAL_COMMUNITY)
Admission: RE | Admit: 2022-06-01 | Discharge: 2022-06-01 | Disposition: A | Discharge status: Home / Self Care | Payer: Medicare PPO | Source: Ambulatory Visit | Attending: Specialist | Admitting: Specialist

## 2022-06-01 ENCOUNTER — Other Ambulatory Visit: Payer: Self-pay

## 2022-06-01 ENCOUNTER — Ambulatory Visit (HOSPITAL_COMMUNITY)
Admission: RE | Admit: 2022-06-01 | Discharge: 2022-06-01 | Disposition: A | Discharge status: Home / Self Care | Payer: Medicare PPO | Source: Ambulatory Visit | Attending: Orthopedic Surgery | Admitting: Orthopedic Surgery

## 2022-06-01 VITALS — BP 150/82 | HR 72 | Temp 97.5°F | Resp 18 | Ht 70.0 in | Wt 198.2 lb

## 2022-06-01 DIAGNOSIS — M48062 Spinal stenosis, lumbar region with neurogenic claudication: Secondary | ICD-10-CM | POA: Diagnosis not present

## 2022-06-01 DIAGNOSIS — R7303 Prediabetes: Secondary | ICD-10-CM | POA: Diagnosis not present

## 2022-06-01 DIAGNOSIS — Z01818 Encounter for other preprocedural examination: Secondary | ICD-10-CM | POA: Insufficient documentation

## 2022-06-01 HISTORY — DX: Gastro-esophageal reflux disease without esophagitis: K21.9

## 2022-06-01 HISTORY — DX: Unspecified hearing loss, unspecified ear: H91.90

## 2022-06-01 HISTORY — DX: Malignant (primary) neoplasm, unspecified: C80.1

## 2022-06-01 HISTORY — DX: Headache, unspecified: R51.9

## 2022-06-01 LAB — CBC
HCT: 45.2 % (ref 39.0–52.0)
Hemoglobin: 15.2 g/dL (ref 13.0–17.0)
MCH: 29 pg (ref 26.0–34.0)
MCHC: 33.6 g/dL (ref 30.0–36.0)
MCV: 86.3 fL (ref 80.0–100.0)
Platelets: 188 10*3/uL (ref 150–400)
RBC: 5.24 MIL/uL (ref 4.22–5.81)
RDW: 13.5 % (ref 11.5–15.5)
WBC: 5.3 10*3/uL (ref 4.0–10.5)
nRBC: 0 % (ref 0.0–0.2)

## 2022-06-01 LAB — GLUCOSE, CAPILLARY: Glucose-Capillary: 131 mg/dL — ABNORMAL HIGH (ref 70–99)

## 2022-06-01 LAB — BASIC METABOLIC PANEL
Anion gap: 12 (ref 5–15)
BUN: 9 mg/dL (ref 8–23)
CO2: 24 mmol/L (ref 22–32)
Calcium: 9.1 mg/dL (ref 8.9–10.3)
Chloride: 105 mmol/L (ref 98–111)
Creatinine, Ser: 0.91 mg/dL (ref 0.61–1.24)
GFR, Estimated: >60 mL/min (ref >60)
Glucose, Bld: 113 mg/dL — ABNORMAL HIGH (ref 70–99)
Potassium: 3.3 mmol/L — ABNORMAL LOW (ref 3.5–5.1)
Sodium: 141 mmol/L (ref 135–145)

## 2022-06-01 LAB — SURGICAL PCR SCREEN
MRSA, PCR: NEGATIVE
Staphylococcus aureus: NEGATIVE

## 2022-06-01 LAB — HEMOGLOBIN A1C
Hgb A1c MFr Bld: 6.6 % — ABNORMAL HIGH (ref 4.8–5.6)
Mean Plasma Glucose: 142.72 mg/dL

## 2022-06-02 ENCOUNTER — Encounter: Payer: Self-pay | Admitting: Emergency Medicine

## 2022-06-02 NOTE — Progress Notes (Signed)
Anesthesia Chart Review:  Follows with cardiology for hx of  CAD s/p CABGx3 2007 (lexiscan 2012 unremarkable), HLD, HTN. Last seen by Laurann Montana, NP on 05/19/22 for preop eval. Per note, "Preoperative clearance - Able to achieve >4 METS. Per AHA/ACC guidelines, he is deemed acceptable risk for the planned procedure without additional cardiovascular testing. Will route to surgical team so they are aware."  NIDDM2, pt on Ozempic, LD 05/30/22. A1c 6.6 on preop labs.  Preop labs reviewed, potassium mildly low 3.3, otherwise unremarkable.   EKG 08/22/21: NSR. Rate 83. Inferolateral T wave inversions. Unchanged from prior.    [facility] Short Stay Center/Anesthesiology Phone [phone] 06/02/2022 3:30 PM

## 2022-06-02 NOTE — Anesthesia Preprocedure Evaluation (Addendum)
Anesthesia Evaluation  Patient identified by MRN, date of birth, ID band Patient awake    Reviewed: Allergy & Precautions, NPO status , Patient's Chart, lab work & pertinent test results  Airway Mallampati: III  TM Distance: >3 FB Neck ROM: Full    Dental no notable dental hx.    Pulmonary neg pulmonary ROS   Pulmonary exam normal        Cardiovascular hypertension, Pt. on medications + CAD and + CABG  Normal cardiovascular exam     Neuro/Psych  Headaches  Neuromuscular disease  negative psych ROS   GI/Hepatic Neg liver ROS,GERD  Medicated and Controlled,,  Endo/Other  diabetes, Oral Hypoglycemic Agents    Renal/GU negative Renal ROS     Musculoskeletal  (+) Arthritis ,    Abdominal   Peds  Hematology negative hematology ROS (+)   Anesthesia Other Findings Stenosis L5-S1 left  Reproductive/Obstetrics                             Anesthesia Physical Anesthesia Plan  ASA: 3  Anesthesia Plan: General   Post-op Pain Management:    Induction: Intravenous  PONV Risk Score and Plan: 2 and Ondansetron, Dexamethasone, Midazolam and Treatment may vary due to age or medical condition  Airway Management Planned: Oral ETT  Additional Equipment:   Intra-op Plan:   Post-operative Plan: Extubation in OR  Informed Consent: I have reviewed the patients History and Physical, chart, labs and discussed the procedure including the risks, benefits and alternatives for the proposed anesthesia with the patient or authorized representative who has indicated his/her understanding and acceptance.     Dental advisory given  Plan Discussed with: CRNA  Anesthesia Plan Comments: (PAT note by Antionette Poles, PA-C: Follows with cardiology for hx of CAD s/p CABGx3 2007 (lexiscan 2012 unremarkable), HLD, HTN. Last seen by Gillian Shields, NP on 05/19/22 for preop eval. Per note, "Preoperative clearance - Able  to achieve>4METS. Per AHA/ACC guidelines,heis deemed acceptable risk for the planned procedure without additional cardiovascular testing. Will route to surgical team so they are aware."  NIDDM2, pt on Ozempic, LD 05/30/22. A1c 6.6 on preop labs.  Preop labs reviewed, potassium mildly low 3.3, otherwise unremarkable.   EKG 08/22/21: NSR. Rate 83. Inferolateral T wave inversions. Unchanged from prior.    )        Anesthesia Quick Evaluation

## 2022-06-07 ENCOUNTER — Other Ambulatory Visit: Payer: Self-pay | Admitting: Internal Medicine

## 2022-06-07 DIAGNOSIS — E1165 Type 2 diabetes mellitus with hyperglycemia: Secondary | ICD-10-CM

## 2022-06-08 ENCOUNTER — Encounter (HOSPITAL_COMMUNITY): Payer: Self-pay | Admitting: Specialist

## 2022-06-08 ENCOUNTER — Inpatient Hospital Stay (HOSPITAL_BASED_OUTPATIENT_CLINIC_OR_DEPARTMENT_OTHER): Payer: Medicare PPO | Admitting: Certified Registered"

## 2022-06-08 ENCOUNTER — Other Ambulatory Visit: Payer: Self-pay

## 2022-06-08 ENCOUNTER — Ambulatory Visit (HOSPITAL_COMMUNITY)
Admission: RE | Admit: 2022-06-08 | Discharge: 2022-06-08 | Disposition: A | Discharge status: Home / Self Care | Payer: Medicare PPO | Attending: Specialist | Admitting: Specialist

## 2022-06-08 ENCOUNTER — Inpatient Hospital Stay (HOSPITAL_COMMUNITY): Payer: Medicare PPO | Admitting: Physician Assistant

## 2022-06-08 ENCOUNTER — Encounter (HOSPITAL_COMMUNITY)
Admission: RE | Disposition: A | Discharge status: Home / Self Care | Payer: Self-pay | Source: Home / Self Care | Attending: Specialist

## 2022-06-08 ENCOUNTER — Inpatient Hospital Stay (HOSPITAL_COMMUNITY): Payer: Medicare PPO

## 2022-06-08 DIAGNOSIS — Z951 Presence of aortocoronary bypass graft: Secondary | ICD-10-CM | POA: Diagnosis not present

## 2022-06-08 DIAGNOSIS — M4807 Spinal stenosis, lumbosacral region: Secondary | ICD-10-CM | POA: Diagnosis not present

## 2022-06-08 DIAGNOSIS — I2510 Atherosclerotic heart disease of native coronary artery without angina pectoris: Secondary | ICD-10-CM | POA: Diagnosis not present

## 2022-06-08 DIAGNOSIS — G709 Myoneural disorder, unspecified: Secondary | ICD-10-CM | POA: Diagnosis not present

## 2022-06-08 DIAGNOSIS — M1990 Unspecified osteoarthritis, unspecified site: Secondary | ICD-10-CM | POA: Diagnosis not present

## 2022-06-08 DIAGNOSIS — M5117 Intervertebral disc disorders with radiculopathy, lumbosacral region: Secondary | ICD-10-CM | POA: Diagnosis not present

## 2022-06-08 DIAGNOSIS — Z7984 Long term (current) use of oral hypoglycemic drugs: Secondary | ICD-10-CM | POA: Diagnosis not present

## 2022-06-08 DIAGNOSIS — M48061 Spinal stenosis, lumbar region without neurogenic claudication: Secondary | ICD-10-CM | POA: Diagnosis present

## 2022-06-08 DIAGNOSIS — E785 Hyperlipidemia, unspecified: Secondary | ICD-10-CM | POA: Diagnosis not present

## 2022-06-08 DIAGNOSIS — I10 Essential (primary) hypertension: Secondary | ICD-10-CM | POA: Diagnosis not present

## 2022-06-08 DIAGNOSIS — Z79899 Other long term (current) drug therapy: Secondary | ICD-10-CM | POA: Insufficient documentation

## 2022-06-08 DIAGNOSIS — Z981 Arthrodesis status: Secondary | ICD-10-CM | POA: Diagnosis not present

## 2022-06-08 DIAGNOSIS — K219 Gastro-esophageal reflux disease without esophagitis: Secondary | ICD-10-CM | POA: Diagnosis not present

## 2022-06-08 DIAGNOSIS — E119 Type 2 diabetes mellitus without complications: Secondary | ICD-10-CM | POA: Diagnosis not present

## 2022-06-08 HISTORY — PX: HEMI-MICRODISCECTOMY LUMBAR LAMINECTOMY LEVEL 1: SHX5846

## 2022-06-08 LAB — GLUCOSE, CAPILLARY
Glucose-Capillary: 161 mg/dL — ABNORMAL HIGH (ref 70–99)
Glucose-Capillary: 174 mg/dL — ABNORMAL HIGH (ref 70–99)

## 2022-06-08 SURGERY — HEMI-MICRODISCECTOMY LUMBAR LAMINECTOMY LEVEL 1
Anesthesia: General | Laterality: Left

## 2022-06-08 MED ORDER — POLYETHYLENE GLYCOL 3350 17 G PO PACK: 17.0000 g | PACK | Freq: Every day | ORAL | 0 refills | Status: DC

## 2022-06-08 MED ORDER — THROMBIN 20000 UNITS EX SOLR
CUTANEOUS | Status: DC | PRN
  Administered 2022-06-08: 20 mL via TOPICAL

## 2022-06-08 MED ORDER — MAGNESIUM CITRATE PO SOLN: 1.0000 | Freq: Once | ORAL | Status: DC | PRN

## 2022-06-08 MED ORDER — VITAMIN C 500 MG PO TABS: 500.0000 mg | ORAL_TABLET | Freq: Every day | ORAL | Status: DC

## 2022-06-08 MED ORDER — MIDAZOLAM HCL 2 MG/2ML IJ SOLN
INTRAMUSCULAR | Status: AC
  Filled 2022-06-08: qty 2

## 2022-06-08 MED ORDER — LIDOCAINE 2% (20 MG/ML) 5 ML SYRINGE
INTRAMUSCULAR | Status: DC | PRN
  Administered 2022-06-08: 60 mg via INTRAVENOUS

## 2022-06-08 MED ORDER — ROCURONIUM BROMIDE 10 MG/ML (PF) SYRINGE
PREFILLED_SYRINGE | INTRAVENOUS | Status: AC
  Filled 2022-06-08: qty 10

## 2022-06-08 MED ORDER — PROPOFOL 1000 MG/100ML IV EMUL
INTRAVENOUS | Status: AC
  Filled 2022-06-08: qty 100

## 2022-06-08 MED ORDER — POLYETHYLENE GLYCOL 3350 17 G PO PACK: 17.0000 g | PACK | Freq: Every day | ORAL | Status: DC | PRN

## 2022-06-08 MED ORDER — ONDANSETRON HCL 4 MG/2ML IJ SOLN
INTRAMUSCULAR | Status: DC | PRN
  Administered 2022-06-08 (×2): 4 mg via INTRAVENOUS

## 2022-06-08 MED ORDER — MAGNESIUM GLYCINATE 665 MG PO CAPS: 1.0000 | ORAL_CAPSULE | Freq: Every day | ORAL | Status: DC

## 2022-06-08 MED ORDER — FENTANYL CITRATE (PF) 100 MCG/2ML IJ SOLN: 25.0000 ug | INTRAMUSCULAR | Status: DC | PRN

## 2022-06-08 MED ORDER — PROPOFOL 10 MG/ML IV BOLUS
INTRAVENOUS | Status: DC | PRN
  Administered 2022-06-08: 100 mg via INTRAVENOUS

## 2022-06-08 MED ORDER — ACETAMINOPHEN 650 MG RE SUPP: 650.0000 mg | RECTAL | Status: DC | PRN

## 2022-06-08 MED ORDER — BISACODYL 5 MG PO TBEC: 5.0000 mg | DELAYED_RELEASE_TABLET | Freq: Every day | ORAL | Status: DC | PRN

## 2022-06-08 MED ORDER — ACETAMINOPHEN 10 MG/ML IV SOLN
INTRAVENOUS | Status: DC | PRN
  Administered 2022-06-08: 1000 mg via INTRAVENOUS

## 2022-06-08 MED ORDER — ONDANSETRON HCL 4 MG/2ML IJ SOLN
INTRAMUSCULAR | Status: AC
  Filled 2022-06-08: qty 2

## 2022-06-08 MED ORDER — RISAQUAD PO CAPS
1.0000 | ORAL_CAPSULE | Freq: Every day | ORAL | Status: DC
  Administered 2022-06-08: 1 via ORAL
  Filled 2022-06-08: qty 1

## 2022-06-08 MED ORDER — METHOCARBAMOL 1000 MG/10ML IJ SOLN: 500.0000 mg | Freq: Four times a day (QID) | INTRAVENOUS | Status: DC | PRN

## 2022-06-08 MED ORDER — FENTANYL CITRATE (PF) 250 MCG/5ML IJ SOLN
INTRAMUSCULAR | Status: AC
  Filled 2022-06-08: qty 5

## 2022-06-08 MED ORDER — DEXAMETHASONE SODIUM PHOSPHATE 10 MG/ML IJ SOLN
INTRAMUSCULAR | Status: DC | PRN
  Administered 2022-06-08: 10 mg via INTRAVENOUS

## 2022-06-08 MED ORDER — EMPAGLIFLOZIN 25 MG PO TABS: 25.0000 mg | ORAL_TABLET | Freq: Every day | ORAL | Status: DC

## 2022-06-08 MED ORDER — ONDANSETRON HCL 4 MG PO TABS: 4.0000 mg | ORAL_TABLET | Freq: Four times a day (QID) | ORAL | Status: DC | PRN

## 2022-06-08 MED ORDER — DEXAMETHASONE SODIUM PHOSPHATE 10 MG/ML IJ SOLN
INTRAMUSCULAR | Status: AC
  Filled 2022-06-08: qty 1

## 2022-06-08 MED ORDER — THROMBIN 20000 UNITS EX SOLR
CUTANEOUS | Status: AC
  Filled 2022-06-08: qty 20000

## 2022-06-08 MED ORDER — AMISULPRIDE (ANTIEMETIC) 5 MG/2ML IV SOLN
INTRAVENOUS | Status: AC
  Filled 2022-06-08: qty 4

## 2022-06-08 MED ORDER — ACETAMINOPHEN 10 MG/ML IV SOLN
INTRAVENOUS | Status: AC
  Filled 2022-06-08: qty 100

## 2022-06-08 MED ORDER — ACETAMINOPHEN 325 MG PO TABS: 650.0000 mg | ORAL_TABLET | ORAL | Status: DC | PRN

## 2022-06-08 MED ORDER — DIPHENHYDRAMINE HCL 50 MG/ML IJ SOLN
INTRAMUSCULAR | Status: AC
  Filled 2022-06-08: qty 1

## 2022-06-08 MED ORDER — DOCUSATE SODIUM 100 MG PO CAPS: 100.0000 mg | ORAL_CAPSULE | Freq: Two times a day (BID) | ORAL | 1 refills | Status: DC | PRN

## 2022-06-08 MED ORDER — TRANEXAMIC ACID-NACL 1000-0.7 MG/100ML-% IV SOLN
INTRAVENOUS | Status: DC | PRN
  Administered 2022-06-08: 1000 mg via INTRAVENOUS

## 2022-06-08 MED ORDER — DOCUSATE SODIUM 100 MG PO CAPS: 100.0000 mg | ORAL_CAPSULE | Freq: Two times a day (BID) | ORAL | Status: DC

## 2022-06-08 MED ORDER — ONDANSETRON HCL 4 MG/2ML IJ SOLN: 4.0000 mg | Freq: Four times a day (QID) | INTRAMUSCULAR | Status: DC | PRN

## 2022-06-08 MED ORDER — INSULIN ASPART 100 UNIT/ML IJ SOLN: 0.0000 [IU] | Freq: Three times a day (TID) | INTRAMUSCULAR | Status: DC

## 2022-06-08 MED ORDER — FENTANYL CITRATE (PF) 100 MCG/2ML IJ SOLN
INTRAMUSCULAR | Status: DC | PRN
  Administered 2022-06-08: 100 ug via INTRAVENOUS
  Administered 2022-06-08: 50 ug via INTRAVENOUS

## 2022-06-08 MED ORDER — BUPIVACAINE-EPINEPHRINE 0.5% -1:200000 IJ SOLN
INTRAMUSCULAR | Status: DC | PRN
  Administered 2022-06-08: 3 mL

## 2022-06-08 MED ORDER — OXYCODONE HCL 5 MG PO TABS: 5.0000 mg | ORAL_TABLET | ORAL | Status: DC | PRN

## 2022-06-08 MED ORDER — 0.9 % SODIUM CHLORIDE (POUR BTL) OPTIME
TOPICAL | Status: DC | PRN
  Administered 2022-06-08: 1000 mL

## 2022-06-08 MED ORDER — SUGAMMADEX SODIUM 200 MG/2ML IV SOLN
INTRAVENOUS | Status: DC | PRN
  Administered 2022-06-08: 150 mg via INTRAVENOUS
  Administered 2022-06-08: 50 mg via INTRAVENOUS

## 2022-06-08 MED ORDER — ALUM & MAG HYDROXIDE-SIMETH 200-200-20 MG/5ML PO SUSP: 30.0000 mL | Freq: Four times a day (QID) | ORAL | Status: DC | PRN

## 2022-06-08 MED ORDER — TRANEXAMIC ACID-NACL 1000-0.7 MG/100ML-% IV SOLN
INTRAVENOUS | Status: AC
  Filled 2022-06-08: qty 100

## 2022-06-08 MED ORDER — BUPIVACAINE-EPINEPHRINE (PF) 0.5% -1:200000 IJ SOLN
INTRAMUSCULAR | Status: AC
  Filled 2022-06-08: qty 30

## 2022-06-08 MED ORDER — ACETAMINOPHEN 10 MG/ML IV SOLN: 1000.0000 mg | Freq: Once | INTRAVENOUS | Status: DC | PRN

## 2022-06-08 MED ORDER — PROPOFOL 500 MG/50ML IV EMUL
INTRAVENOUS | Status: DC | PRN
  Administered 2022-06-08: 25 ug/kg/min via INTRAVENOUS

## 2022-06-08 MED ORDER — CEFAZOLIN SODIUM-DEXTROSE 2-4 GM/100ML-% IV SOLN
2.0000 g | INTRAVENOUS | Status: AC
  Administered 2022-06-08: 2 g via INTRAVENOUS

## 2022-06-08 MED ORDER — PHENOL 1.4 % MT LIQD: 1.0000 | OROMUCOSAL | Status: DC | PRN

## 2022-06-08 MED ORDER — MIDAZOLAM HCL 5 MG/5ML IJ SOLN
INTRAMUSCULAR | Status: DC | PRN
  Administered 2022-06-08: 2 mg via INTRAVENOUS

## 2022-06-08 MED ORDER — METHOCARBAMOL 500 MG PO TABS: 500.0000 mg | ORAL_TABLET | Freq: Four times a day (QID) | ORAL | Status: DC | PRN

## 2022-06-08 MED ORDER — CEFAZOLIN SODIUM-DEXTROSE 2-4 GM/100ML-% IV SOLN
INTRAVENOUS | Status: AC
  Filled 2022-06-08: qty 100

## 2022-06-08 MED ORDER — CEFAZOLIN SODIUM-DEXTROSE 2-4 GM/100ML-% IV SOLN: 2.0000 g | Freq: Three times a day (TID) | INTRAVENOUS | Status: DC

## 2022-06-08 MED ORDER — PROPOFOL 10 MG/ML IV BOLUS
INTRAVENOUS | Status: AC
  Filled 2022-06-08: qty 20

## 2022-06-08 MED ORDER — OXYCODONE HCL 5 MG PO TABS: 10.0000 mg | ORAL_TABLET | ORAL | Status: DC | PRN

## 2022-06-08 MED ORDER — ROCURONIUM BROMIDE 100 MG/10ML IV SOLN
INTRAVENOUS | Status: DC | PRN
  Administered 2022-06-08: 60 mg via INTRAVENOUS

## 2022-06-08 MED ORDER — ONDANSETRON HCL 4 MG/2ML IJ SOLN: 4.0000 mg | Freq: Once | INTRAMUSCULAR | Status: DC | PRN

## 2022-06-08 MED ORDER — MENTHOL 3 MG MT LOZG: 1.0000 | LOZENGE | OROMUCOSAL | Status: DC | PRN

## 2022-06-08 MED ORDER — PANTOPRAZOLE SODIUM 40 MG PO TBEC: 40.0000 mg | DELAYED_RELEASE_TABLET | Freq: Every day | ORAL | Status: DC

## 2022-06-08 MED ORDER — LIDOCAINE 2% (20 MG/ML) 5 ML SYRINGE
INTRAMUSCULAR | Status: AC
  Filled 2022-06-08: qty 5

## 2022-06-08 MED ORDER — METHOCARBAMOL 750 MG PO TABS: 750.0000 mg | ORAL_TABLET | Freq: Three times a day (TID) | ORAL | 1 refills | Status: DC | PRN

## 2022-06-08 MED ORDER — LOSARTAN POTASSIUM 50 MG PO TABS
50.0000 mg | ORAL_TABLET | Freq: Every day | ORAL | Status: DC
  Filled 2022-06-08: qty 1

## 2022-06-08 MED ORDER — DIPHENHYDRAMINE HCL 50 MG/ML IJ SOLN
INTRAMUSCULAR | Status: DC | PRN
  Administered 2022-06-08: 6.25 mg via INTRAVENOUS

## 2022-06-08 MED ORDER — POTASSIUM CHLORIDE IN NACL 20-0.45 MEQ/L-% IV SOLN
INTRAVENOUS | Status: DC
  Filled 2022-06-08: qty 1000

## 2022-06-08 MED ORDER — HYDROMORPHONE HCL 1 MG/ML IJ SOLN
0.5000 mg | INTRAMUSCULAR | Status: DC | PRN
  Administered 2022-06-08: 0.5 mg via INTRAVENOUS
  Filled 2022-06-08: qty 0.5

## 2022-06-08 MED ORDER — OXYCODONE HCL 5 MG PO TABS: 5.0000 mg | ORAL_TABLET | ORAL | 0 refills | Status: DC | PRN

## 2022-06-08 MED ORDER — LACTATED RINGERS IV SOLN: INTRAVENOUS | Status: DC

## 2022-06-08 MED ORDER — AMISULPRIDE (ANTIEMETIC) 5 MG/2ML IV SOLN
10.0000 mg | Freq: Once | INTRAVENOUS | Status: AC | PRN
  Administered 2022-06-08: 10 mg via INTRAVENOUS

## 2022-06-08 SURGICAL SUPPLY — 58 items
BAG COUNTER SPONGE SURGICOUNT (BAG) ×1 IMPLANT
BAG DECANTER FOR FLEXI CONT (MISCELLANEOUS) IMPLANT
BAG SPNG CNTER NS LX DISP (BAG) ×3
BAND INSRT 18 STRL LF DISP RB (MISCELLANEOUS) ×2
BAND RUBBER #18 3X1/16 STRL (MISCELLANEOUS) ×2 IMPLANT
BUR EGG ELITE 5.0 (BURR) IMPLANT
BUR RND DIAMOND ELITE 4.0 (BURR) IMPLANT
CLEANER TIP ELECTROSURG 2X2 (MISCELLANEOUS) ×1 IMPLANT
CNTNR URN SCR LID CUP LEK RST (MISCELLANEOUS) ×1 IMPLANT
CONT SPEC 4OZ STRL OR WHT (MISCELLANEOUS) ×1
DRAPE LAPAROTOMY 100X72X124 (DRAPES) ×1 IMPLANT
DRAPE MICROSCOPE SLANT 54X150 (MISCELLANEOUS) ×1 IMPLANT
DRAPE SHEET LG 3/4 BI-LAMINATE (DRAPES) ×1 IMPLANT
DRAPE SURG 17X11 SM STRL (DRAPES) ×1 IMPLANT
DRAPE UTILITY XL STRL (DRAPES) ×1 IMPLANT
DRSG AQUACEL AG ADV 3.5X 4 (GAUZE/BANDAGES/DRESSINGS) IMPLANT
DRSG AQUACEL AG ADV 3.5X 6 (GAUZE/BANDAGES/DRESSINGS) IMPLANT
DRSG TELFA 3X8 NADH STRL (GAUZE/BANDAGES/DRESSINGS) IMPLANT
DURAPREP 26ML APPLICATOR (WOUND CARE) ×1 IMPLANT
DURASEAL SPINE SEALANT 3ML (MISCELLANEOUS) IMPLANT
ELECT BLADE 4.0 EZ CLEAN MEGAD (MISCELLANEOUS)
ELECT REM PT RETURN 9FT ADLT (ELECTROSURGICAL) ×1
ELECTRODE BLDE 4.0 EZ CLN MEGD (MISCELLANEOUS) IMPLANT
ELECTRODE REM PT RTRN 9FT ADLT (ELECTROSURGICAL) ×1 IMPLANT
GLOVE BIOGEL PI IND STRL 7.5 (GLOVE) ×1 IMPLANT
GLOVE SURG SS PI 7.0 STRL IVOR (GLOVE) ×1 IMPLANT
GLOVE SURG SS PI 8.0 STRL IVOR (GLOVE) ×2 IMPLANT
GOWN STRL REUS W/ TWL LRG LVL3 (GOWN DISPOSABLE) ×1 IMPLANT
GOWN STRL REUS W/ TWL XL LVL3 (GOWN DISPOSABLE) ×1 IMPLANT
GOWN STRL REUS W/TWL LRG LVL3 (GOWN DISPOSABLE) ×1
GOWN STRL REUS W/TWL XL LVL3 (GOWN DISPOSABLE) ×1
IV CATH 14GX2 1/4 (CATHETERS) ×1 IMPLANT
KIT BASIN OR (CUSTOM PROCEDURE TRAY) ×1 IMPLANT
NDL 22X1.5 STRL (OR ONLY) (MISCELLANEOUS) ×1 IMPLANT
NDL SPNL 18GX3.5 QUINCKE PK (NEEDLE) ×2 IMPLANT
NEEDLE 22X1.5 STRL (OR ONLY) (MISCELLANEOUS) ×1 IMPLANT
NEEDLE SPNL 18GX3.5 QUINCKE PK (NEEDLE) ×2 IMPLANT
PACK LAMINECTOMY NEURO (CUSTOM PROCEDURE TRAY) ×1 IMPLANT
PATTIES SURGICAL .75X.75 (GAUZE/BANDAGES/DRESSINGS) ×1 IMPLANT
SOLUTION PRONTOSAN WOUND 350ML (IRRIGATION / IRRIGATOR) IMPLANT
SPONGE SURGIFOAM ABS GEL 100 (HEMOSTASIS) ×1 IMPLANT
SPONGE T-LAP 4X18 ~~LOC~~+RFID (SPONGE) IMPLANT
STAPLER VISISTAT (STAPLE) IMPLANT
STRIP CLOSURE SKIN 1/2X4 (GAUZE/BANDAGES/DRESSINGS) ×1 IMPLANT
SUT NURALON 4 0 TR CR/8 (SUTURE) IMPLANT
SUT PROLENE 3 0 PS 2 (SUTURE) IMPLANT
SUT VIC AB 1 CT1 27 (SUTURE)
SUT VIC AB 1 CT1 27XBRD ANTBC (SUTURE) IMPLANT
SUT VIC AB 1-0 CT2 27 (SUTURE) IMPLANT
SUT VIC AB 2-0 CT1 27 (SUTURE)
SUT VIC AB 2-0 CT1 TAPERPNT 27 (SUTURE) IMPLANT
SUT VIC AB 2-0 CT2 27 (SUTURE) IMPLANT
SYR 3ML LL SCALE MARK (SYRINGE) ×1 IMPLANT
TOWEL GREEN STERILE (TOWEL DISPOSABLE) ×1 IMPLANT
TOWEL GREEN STERILE FF (TOWEL DISPOSABLE) ×1 IMPLANT
TRAY FOLEY MTR SLVR 16FR STAT (SET/KITS/TRAYS/PACK) ×1 IMPLANT
WIPE CHG 2% 2PK PREOPERATIVE (MISCELLANEOUS) ×1 IMPLANT
YANKAUER SUCT BULB TIP NO VENT (SUCTIONS) ×1 IMPLANT

## 2022-06-08 NOTE — Anesthesia Postprocedure Evaluation (Signed)
Anesthesia Post Note  Patient: [name] Love  Procedure(s) Performed: Micro hemi laminectomy Lumbar Five-Sacral One left (Left)     Patient location during evaluation: PACU Anesthesia Type: General Level of consciousness: awake Pain management: pain level controlled Vital Signs Assessment: post-procedure vital signs reviewed and stable Respiratory status: spontaneous breathing, nonlabored ventilation, respiratory function stable and patient connected to nasal cannula oxygen Cardiovascular status: blood pressure returned to baseline and stable Postop Assessment: no apparent nausea or vomiting Anesthetic complications: no   No notable events documented.  Last Vitals:  Vitals:   06/08/22 1345 06/08/22 1420  BP: (!) 144/66 123/76  Pulse: 66 68  Resp: 11 18  Temp: (!) 36.3 C   SpO2: 92% 95%    Last Pain:  Vitals:   06/08/22 1520  TempSrc:   PainSc: 2                  Iyannah Blake P Tanaja Ganger

## 2022-06-08 NOTE — Op Note (Addendum)
NAMEDONTEE, JASO MEDICAL RECORD NO: [id number] ACCOUNT NO: [id number] DATE OF BIRTH: 1956-05-24 FACILITY: [facility] LOCATION: [facility] PHYSICIAN: Johnn Hai, MD  Operative Report   DATE OF PROCEDURE: 06/08/2022  PREOPERATIVE DIAGNOSIS:  Spinal stenosis, L5-S1, left.  POSTOPERATIVE DIAGNOSES:  Spinal stenosis, L5-S1, left.  PROCEDURE PERFORMED:   1.  Hemilaminotomy L5-S1, left. 2.  Foraminotomy L5-S1, left.  ANESTHESIA:  General.  ASSISTANT:  Lacie Draft, PA.  HISTORY:  A 66 year old left lower extremity radicular pain, L5-S1 nerve root distribution myelogram indicating effacement of the lateral recess S1.  The S1 radicular pain.  He was indicated for decompression of the S1 nerve root, failing conservative  treatment.  Risks and benefits discussed including bleeding, infection, damage to neurovascular structures, no change in symptoms, worsening symptoms, DVT, PE, anesthetic complications, etc.  DESCRIPTION OF PROCEDURE:  With the patient in supine position, after induction of adequate general anesthesia, 2 grams Kefzol placed prone on the Wilson frame.  All bony prominences well padded.  Lumbar region was prepped and draped in the usual sterile  fashion.  Two 18-gauge spinal needles utilized to localize 5-in-1 interspace confirmed with x-ray.  Incision was made from the spinous process, L5-S1.  Subcutaneous tissue was dissected.  Electrocautery was utilized to achieve hemostasis.  Dorsal lumbar  fascia divided in line with skin incision.  Paraspinous muscle elevated from lamina of L5 and S1.  McCulloch retractor was placed.  Operating microscope was draped and brought in the surgical field.  Confirmatory radiograph obtained.  I performed  hemilaminotomy of the caudad edge of 5 extended up with a high-speed bur diamond.  I used a straight curette detach ligamentum flavum from the cephalad edge of S1.  The patient appeared to have a pars defect on the left.  Very hypertrophic  facet noted.  After removing approximately 30% of the medial border of the inferior articulating process of 5, I identified the superior articulating process of S1.  I used a straight micro curette to develop a plane between the hypertrophic ligamentum flavum from the  superior articulating process.  This extended into the L5 foramen.  After developing a plane I used a 2 mm Kerrison, just to remove the tip of the superior articulating process and then a Penfield 4 developing plane between the lateral recess and the  medial border of the pedicle.  I then decompressed the lateral recess to the medial border of the pedicle.  Noted was extensive epidural venous plexus just beneath this.  I continued distally.  Identifying the S1 nerve root, protecting it medially a very  small interlaminar window distally.  I performed a foraminotomy of S1.  There was hypertrophic ligamentum flavum as well in the interspace that was removed.  Bipolar cautery was utilized to achieve hemostasis.  From the inferior portion of L5 from  hemilaminotomy to the cephalad to the point detaching the ligamentum flavum there was calcified ligamentum flavum noted that I developed the plane between the thecal sac and the ligamentum flavum, protecting the neural elements.  I removed this.   Protecting the L5 nerve root and performed a foraminotomy of L5.  There was no disk herniation noted here.  Following this, a Woodson probe passed freely out the foramen of L5 and S1.  Main pathology was stenosis in the lateral recess, compressing the S1  nerve root.  Epidural venous plexus had been lysed and cauterized.  Confirmatory radiograph obtained with a Woodson in the foramen of S1.  Bone wax was placed on  the cancellous surfaces.  No evidence of CSF leakage or active bleeding.  Woodson probe  passed above the pedicle of L5 and below the pedicle of S1.  The patient had a transitional segment just below that.  Following this, thrombin-soaked Gelfoam  was placed in the laminotomy defect and then removed.  I then removed the [facility] retractor.   Paraspinous muscles inspected.  No evidence of active bleeding.  I then copiously irrigated the paraspinous musculature, closed the dorsal lumbar fascia with #1 Vicryl in interrupted figure-of-eight suture, subcutaneous with 2-0 and skin with  subcuticular Prolene.  Sterile dressing applied.  Placed supine on the hospital bed, extubated without difficulty and transported to the recovery room in satisfactory condition.  The patient tolerated the procedure well.  There were no complications.  ASSISTANT:  Lacie Draft, PA.  BLOOD LOSS:  25 mL.   PUS D: 06/08/2022 12:50:47 pm T: 06/08/2022 2:36:00 pm  JOB: [id number]/ [id number]

## 2022-06-08 NOTE — Plan of Care (Signed)
Pt doing well. Pt and wife given D/C instructions with verbal understanding. Rx's were sent to the pharmacy by MD. Pt's incision is clean and dry with no sign of infection. Pt's IV was removed prior to D/C. Pt D/C'd home via wheelchair per MD order. Pt is stable @ D/C and has no other needs at this time. Demri Poulton, RN  

## 2022-06-08 NOTE — Progress Notes (Signed)
PT Cancellation Note  Patient Details Name: [name] Love MRN: [id number] DOB: 10/28/1955   Cancelled Treatment:    Reason Eval/Treat Not Completed: PT screened, no needs identified, will sign off. Per discussion with OT and pt, the pt is able to ambulate at a modI level, negotiates stairs well, has no current acute PT needs. PT screening the pt at this time.   Zenaida Niece 06/08/2022, 5:30 PM

## 2022-06-08 NOTE — Brief Op Note (Signed)
06/08/2022  10:51 AM  PATIENT:  [name] Love  66 y.o. male  PRE-OPERATIVE DIAGNOSIS:  Stenosis L5-S1 left  POST-OPERATIVE DIAGNOSIS:  * No post-op diagnosis entered *  PROCEDURE:  Procedure(s) with comments: Micro hemi laminectomy L5-S1 left (Left) - 90 mins 3 C-Bed  SURGEON:  Surgeon(s) and Role:    Susa Day, MD - Primary  PHYSICIAN ASSISTANT:   ASSISTANTS: Bissell   ANESTHESIA:   general  EBL:  25   BLOOD ADMINISTERED:none  DRAINS: none   LOCAL MEDICATIONS USED:  MARCAINE     SPECIMEN:  No Specimen  DISPOSITION OF SPECIMEN:  N/A  COUNTS:  YES  TOURNIQUET:  * No tourniquets in log *  DICTATION: .Other Dictation: Dictation Number   [id number]  PLAN OF CARE: Admit for overnight observation  PATIENT DISPOSITION:  PACU - hemodynamically stable.   Delay start of Pharmacological VTE agent (>24hrs) due to surgical blood loss or risk of bleeding: yes

## 2022-06-08 NOTE — Anesthesia Procedure Notes (Addendum)
Procedure Name: Intubation Date/Time: 06/08/2022 10:51 AM  Performed by: Gwyndolyn Saxon, CRNAPre-anesthesia Checklist: Patient identified, Emergency Drugs available, Suction available and Patient being monitored Patient Re-evaluated:Patient Re-evaluated prior to induction Oxygen Delivery Method: Circle system utilized Preoxygenation: Pre-oxygenation with 100% oxygen Induction Type: IV induction Ventilation: Mask ventilation without difficulty Laryngoscope Size: Miller and 2 Grade View: Grade II Tube type: Oral Tube size: 8.0 mm Number of attempts: 1 Airway Equipment and Method: Patient positioned with wedge pillow and Stylet Placement Confirmation: ETT inserted through vocal cords under direct vision, positive ETCO2 and breath sounds checked- equal and bilateral Secured at: 24 cm Tube secured with: Tape Dental Injury: Teeth and Oropharynx as per pre-operative assessment

## 2022-06-08 NOTE — Evaluation (Signed)
Occupational Therapy Evaluation Patient Details Name: [name] Love MRN: [id number] DOB: 1956-01-28 Today's Date: 06/08/2022   History of Present Illness 66 yo M s/p Hemilaminotomy L5-S1, left and Foraminotomy L5-S1 left.  PMH includes: Anemia, Arthritis, Coronary artery disease post CABG x3 in 2007, Diabetes mellitus, Hyperlipidemia, Hypertension.   Clinical Impression   Patient admitted for the procedure above.  He reports long standing back pain with prolonged running/walking.  He lives at home with his spouse, who is able to assist as needed.  Currently he expresses post op discomfort, and a little referred pain to his L hip/buttocks, but he is functioning very well.  He was minimal setup for ADL completion at a sit to stand level, walking the halls without assist, and was able to ascend a flight of stairs without incident.  Back precautions reviewed with good understanding and all questions answered.  No further OT needs in the acute setting.  Recommend follow up with MD as prescribed.        Recommendations for follow up therapy are one component of a multi-disciplinary discharge planning process, led by the attending physician.  Recommendations may be updated based on patient status, additional functional criteria and insurance authorization.   Follow Up Recommendations  No OT follow up     Assistance Recommended at Discharge PRN  Patient can return home with the following Assist for transportation    Functional Status Assessment  Patient has not had a recent decline in their functional status  Equipment Recommendations  None recommended by OT    Recommendations for Other Services       Precautions / Restrictions Precautions Precautions: Back Precaution Booklet Issued: Yes (comment) Precaution Comments: Reviewed Restrictions Weight Bearing Restrictions: No      Mobility Bed Mobility Overal bed mobility: Modified Independent                   Transfers Overall transfer level: Modified independent Equipment used: None               General transfer comment: slower pace      Balance Overall balance assessment: No apparent balance deficits (not formally assessed)                                         ADL either performed or assessed with clinical judgement   ADL Overall ADL's : At baseline                                             Vision Baseline Vision/History: 1 Wears glasses Patient Visual Report: No change from baseline       Perception     Praxis      Pertinent Vitals/Pain Pain Assessment Pain Assessment: Faces Faces Pain Scale: Hurts a little bit Pain Location: L side of low back/buttocks Pain Descriptors / Indicators: Tender Pain Intervention(s): Monitored during session     Hand Dominance Right   Extremity/Trunk Assessment Upper Extremity Assessment Upper Extremity Assessment: Overall WFL for tasks assessed   Lower Extremity Assessment Lower Extremity Assessment: Overall WFL for tasks assessed   Cervical / Trunk Assessment Cervical / Trunk Assessment: Back Surgery   Communication Communication Communication: No difficulties   Cognition Arousal/Alertness: Awake/alert Behavior During Therapy: WFL for tasks assessed/performed Overall Cognitive Status:  Within Functional Limits for tasks assessed                                       General Comments   VSS on RA    Exercises     Shoulder Instructions      Home Living Family/patient expects to be discharged to:: Private residence Living Arrangements: Spouse/significant other Available Help at Discharge: Family;Available 24 hours/day Type of Home: House Home Access: Stairs to enter [employer] of Steps: 2   Home Layout: Two level;Bed/bath upstairs;1/2 bath on main level Alternate Level Stairs-Number of Steps: flight Alternate Level Stairs-Rails:  Right Bathroom Shower/Tub: Occupational psychologist: Standard Bathroom Accessibility: Yes How Accessible: Accessible via walker Home Equipment: None          Prior Functioning/Environment Prior Level of Function : Independent/Modified Independent;Driving                        OT Problem List: Pain      OT Treatment/Interventions:      OT Goals(Current goals can be found in the care plan section) Acute Rehab OT Goals Patient Stated Goal: Return home OT Goal Formulation: With patient Time For Goal Achievement: 06/12/22 Potential to Achieve Goals: Good  OT Frequency:      Co-evaluation              AM-PAC OT "6 Clicks" Daily Activity     Outcome Measure Help from another person eating meals?: None Help from another person taking care of personal grooming?: None Help from another person toileting, which includes using toliet, bedpan, or urinal?: None Help from another person bathing (including washing, rinsing, drying)?: None Help from another person to put on and taking off regular upper body clothing?: None Help from another person to put on and taking off regular lower body clothing?: None 6 Click Score: 24   End of Session Nurse Communication: Mobility status  Activity Tolerance: Patient tolerated treatment well Patient left: in bed;with call bell/phone within reach;with family/visitor present  OT Visit Diagnosis: Pain Pain - part of body:  (low back)                Time: 3154-0086 OT Time Calculation (min): 22 min Charges:  OT General Charges $OT Visit: 1 Visit OT Evaluation $OT Eval Moderate Complexity: 1 Mod  06/08/2022  RP, OTR/L  Acute Rehabilitation Services  Office:  [phone]   Metta Clines 06/08/2022, 5:15 PM

## 2022-06-08 NOTE — Interval H&P Note (Signed)
History and Physical Interval Note:  06/08/2022 9:55 AM  [name] Love  has presented today for surgery, with the diagnosis of Stenosis L5-S1 left.  The various methods of treatment have been discussed with the patient and family. After consideration of risks, benefits and other options for treatment, the patient has consented to  Procedure(s) with comments: Micro hemi laminectomy L5-S1 left (Left) - 90 mins 3 C-Bed as a surgical intervention.  The patient's history has been reviewed, patient examined, no change in status, stable for surgery.  I have reviewed the patient's chart and labs.  Questions were answered to the patient's satisfaction.     Johnn Hai

## 2022-06-08 NOTE — Discharge Instructions (Signed)

## 2022-06-08 NOTE — Transfer of Care (Signed)
Immediate Anesthesia Transfer of Care Note  Patient: Shantel Wesely  Procedure(s) Performed: Micro hemi laminectomy Lumbar Five-Sacral One left (Left)  Patient Location: PACU  Anesthesia Type:General  Level of Consciousness: drowsy and patient cooperative  Airway & Oxygen Therapy: Patient Spontanous Breathing and Patient connected to face mask oxygen  Post-op Assessment: Report given to RN and Post -op Vital signs reviewed and stable  Post vital signs: Reviewed and stable  Last Vitals:  Vitals Value Taken Time  BP 175/73 06/08/22 1254  Temp    Pulse 65 06/08/22 1257  Resp 19 06/08/22 1257  SpO2 98 % 06/08/22 1257  Vitals shown include unvalidated device data.  Last Pain:  Vitals:   06/08/22 0957  TempSrc:   PainSc: 1       Patients Stated Pain Goal: 0 (77/41/42 3953)  Complications: No notable events documented.

## 2022-06-09 ENCOUNTER — Encounter (HOSPITAL_COMMUNITY): Payer: Self-pay | Admitting: Specialist

## 2022-06-15 ENCOUNTER — Encounter: Payer: Self-pay | Admitting: Internal Medicine

## 2022-06-19 ENCOUNTER — Encounter: Payer: Self-pay | Admitting: Internal Medicine

## 2022-06-19 ENCOUNTER — Ambulatory Visit: Payer: Medicare PPO | Admitting: Internal Medicine

## 2022-06-19 VITALS — BP 110/64 | HR 83 | Ht 70.0 in | Wt 195.4 lb

## 2022-06-19 DIAGNOSIS — E669 Obesity, unspecified: Secondary | ICD-10-CM | POA: Diagnosis not present

## 2022-06-19 DIAGNOSIS — E785 Hyperlipidemia, unspecified: Secondary | ICD-10-CM | POA: Diagnosis not present

## 2022-06-19 DIAGNOSIS — E1159 Type 2 diabetes mellitus with other circulatory complications: Secondary | ICD-10-CM | POA: Diagnosis not present

## 2022-06-19 DIAGNOSIS — E1165 Type 2 diabetes mellitus with hyperglycemia: Secondary | ICD-10-CM

## 2022-06-19 DIAGNOSIS — Z6832 Body mass index (BMI) 32.0-32.9, adult: Secondary | ICD-10-CM

## 2022-06-19 LAB — POCT GLYCOSYLATED HEMOGLOBIN (HGB A1C): Hemoglobin A1C: 6.7 % — AB (ref 4.0–5.6)

## 2022-06-19 NOTE — Progress Notes (Signed)
Patient ID: [name] Love, male   DOB: [birth year], 66 y.o.   MRN: [id number]   HPI: [name] Love is a 66 y.o.-year-old male, initially referred by his PCP, Dr. Mitchel Love, returning for follow-up for DM2, dx in ~2008, non-insulin-dependent, uncontrolled, with long term complications (CAD - s/p CABG in 2007, CKD, PN, DR).  Last visit 4 months ago.  Interim history: No increased urination, blurry vision, nausea, chest pain.  He previously had steroid injections for back pain, but at last visit he started acupuncture. However, on 06/08/2022 he had spinal stenosis surgery. He was off his diabetic meds for the surgery.  He is not very mobile, but he feels his pain has improved.  He may have to have a nerve ablation, also.  Reviewing HbA1c levels: Lab Results  Component Value Date   HGBA1C 6.6 (H) 06/01/2022   HGBA1C 6.8 (A) 02/06/2022   HGBA1C 7.1 (A) 09/19/2021   HGBA1C 7.1 (A) 05/16/2021   HGBA1C 7.3 (A) 01/03/2021   HGBA1C 6.4 (A) 09/14/2020   HGBA1C 6.9 (A) 05/12/2020   HGBA1C 6.9 (A) 12/02/2019   HGBA1C 6.9 (A) 08/04/2019   HGBA1C 7.3 (H) 06/10/2019   HGBA1C 6.8 (A) 04/02/2019   HGBA1C 7.0 (A) 11/05/2018   HGBA1C 6.5 (A) 07/10/2018   HGBA1C 6.8 (A) 05/27/2018   HGBA1C 6.9 (A) 03/21/2018  He got a steroid inj in shoulder >> 05/10/2017.  Pt is on a regimen of: - Metformin 1000 mg 2x a Love, with meals >> ER formulation -improved diarrhea - Farxiga 10 mg >> Jardiance 25 before b'fast - Trulicity 1.5 mg weekly-started 05/2017 >> 3 mg weekly >> Ozempic 1 mg weekly We stopped glipizide in 11/2019.  He checks his sugars more than 4 times a Love with his freestyle libre CGM - [facility]:  Previously:   Previously:   Lowest sugar was 66 (Libre) >> ... 80 >> 90s; it is unclear at which level he has hypoglycemia awareness. Highest sugar was 298 >> 200s >> 200s.  Pt's meals are: - Breakfast: bagel, OJ >> cereals >> occas. Cereals, or eggs  >> stopped OJ  - Lunch:  soup or sandwich or leftovers - Dinner: meat, veggies - Snacks: not daily  -+ Mild CKD, last BUN/creatinine:  Lab Results  Component Value Date   BUN 9 06/01/2022   BUN 13 07/04/2021   CREATININE 0.91 06/01/2022   CREATININE 1.03 07/04/2021  On losartan 50.  -+ HL; last set of lipids: Lab Results  Component Value Date   CHOL 87 07/04/2021   HDL 31.60 (L) 07/04/2021   LDLCALC 25 07/04/2021   LDLDIRECT 51 06/21/2017   TRIG 152.0 (H) 07/04/2021   CHOLHDL 3 07/04/2021  On Lipitor, omega-3 fatty acids 3000 mg 2x a Love.  - last eye exam was 01/31/2022: + DR.  Dr. Alois Love.   -+ Numbness and tingling in his feet.  Last foot exam 09/19/2021.  On ASA 81.  He has a history of a slightly elevated TSH, which normalized at last visit: Lab Results  Component Value Date   TSH 1.67 09/19/2021   TSH 5.170 (H) 06/24/2020   TSH 2.918 05/22/2013   TSH 1.843 09/12/2012   ROS: + See HPI  I reviewed pt's medications, allergies, PMH, social hx, family hx, and changes were documented in the history of present illness. Otherwise, unchanged from my initial visit note.  Past Medical History:  Diagnosis Date   Anemia    Arthritis    Cancer ([place])  skin cancer on left posterior neck - excised   Coronary artery disease    post CABG x3 in 2007   Diabetes mellitus    type 2   GERD (gastroesophageal reflux disease)    Headache    no current problem   Hearing loss    some hearing loss bilateral - no hearing aids   History of kidney stones 1990   passed stone   Hyperlipidemia    Hypertension    Obesity    Past Surgical History:  Procedure Laterality Date   CARDIAC CATHETERIZATION  EF= 45-50%   EF= 45-50% -- Three-vessel coronary artery disease with high-grade lesions in the proximal left anterior descending artery and multiple lesions in the right coronary -- Low normal ejection fraction -- Stable exertional angina -- [name] Love, M.D. [facility]   COLONOSCOPY  09/26/2021   CORONARY  ARTERY BYPASS GRAFT  02/19/2006    x3 (left internal mammary artery to LAD, left radial artery to posterior descending, saphenous vein graft to first diagonal), endoscopic vein harvest right leg -- SURGEON:  [name] Love. [name] Love, M.D.   HEMI-MICRODISCECTOMY LUMBAR LAMINECTOMY LEVEL 1 Left 06/08/2022   Procedure: Micro hemi laminectomy Lumbar Five-Sacral One left;  Surgeon: [name] Day, MD;  Location: [place];  Service: Orthopedics;  Laterality: Left;  90 mins 3 C-Bed   HERNIA REPAIR     at 66 y/o   SHOULDER ARTHROSCOPY Right 2007   tripple bypass     UPPER GASTROINTESTINAL ENDOSCOPY  09/26/2021   Social History   Socioeconomic History   Marital status: Married    Spouse name: Not on file   Number of children: 3  Social Needs  Occupational History   unemployed  Tobacco Use   Smoking status: Never Smoker   Smokeless tobacco: Never Used  Substance and Sexual Activity   Alcohol use: Yes    Alcohol/week:     Types: 1-2 per mo   Drug use: No      Other Topics      Social History Narrative      Current Outpatient Medications on File Prior to Visit  Medication Sig Dispense Refill   acetaminophen (TYLENOL) 500 MG tablet Take 1,000 mg by mouth every 8 (eight) hours as needed for moderate pain.     ascorbic acid (VITAMIN C) 500 MG tablet Take 500 mg by mouth daily.     Continuous Blood Gluc Sensor (FREESTYLE LIBRE 14 Love SENSOR) MISC USE SENSOR EVERY 14 DAYS. CHANGE SENSOR EVERY 2 WEEKS AS DIRECTED 6 each 0   docusate sodium (COLACE) 100 MG capsule Take 1 capsule (100 mg total) by mouth 2 (two) times daily as needed for mild constipation. 30 capsule 1   empagliflozin (JARDIANCE) 25 MG TABS tablet Take 1 tablet (25 mg total) by mouth daily before breakfast. 90 tablet 3   ferrous sulfate 325 (65 FE) MG EC tablet Take 325 mg by mouth daily.     losartan (COZAAR) 50 MG tablet Take 1 tablet (50 mg total) by mouth daily. 90 tablet 3   Magnesium Glycinate 665 MG CAPS Take 1 tablet by mouth  in the morning and at bedtime.     metFORMIN (GLUCOPHAGE-XR) 500 MG 24 hr tablet TAKE 2 TABLETS BY MOUTH IN THE MORNING AND 2 AT BEDTIME 360 tablet 0   methocarbamol (ROBAXIN-750) 750 MG tablet Take 1 tablet (750 mg total) by mouth every 8 (eight) hours as needed for muscle spasms. 30 tablet 1   omeprazole (PRILOSEC) 20 MG  capsule Take 1 capsule (20 mg total) by mouth 2 (two) times daily before a meal. 60 capsule 3   oxyCODONE (OXY IR/ROXICODONE) 5 MG immediate release tablet Take 1 tablet (5 mg total) by mouth every 4 (four) hours as needed for severe pain. 40 tablet 0   OZEMPIC, 1 MG/DOSE, 4 MG/3ML SOPN INJECT 1 MG SUBCUTANEOUSLY ONCE A WEEK 9 mL 0   polyethylene glycol (MIRALAX / GLYCOLAX) 17 g packet Take 17 g by mouth daily. 14 each 0   POTASSIUM GLUCONATE PO Take 650 mg by mouth 2 (two) times daily.     No current facility-administered medications on file prior to visit.   No Known Allergies Family History  Problem Relation Age of Onset   Heart disease Mother    Colon cancer Mother        48   Hypertension Mother    Cancer Father        type unknown   Kidney disease Maternal Aunt    Diabetes Maternal Uncle    Heart attack Maternal Grandfather 49   Colon polyps Neg Hx    Esophageal cancer Neg Hx    Rectal cancer Neg Hx    Stomach cancer Neg Hx    PE: BP 110/64 (BP Location: Left Arm, Patient Position: Sitting, Cuff Size: Normal)   Pulse 83   Ht '5\' 10"'$  (1.778 m)   Wt 195 lb 6.4 oz (88.6 kg)   SpO2 97%   BMI 28.04 kg/m  Wt Readings from Last 3 Encounters:  06/19/22 195 lb 6.4 oz (88.6 kg)  06/08/22 195 lb (88.5 kg)  06/01/22 198 lb 3.2 oz (89.9 kg)   Constitutional: overweight, in NAD Eyes: EOMI, no exophthalmos ENT: no thyromegaly, no cervical lymphadenopathy Cardiovascular: RRR, No MRG Respiratory: CTA B Musculoskeletal: no deformities Skin: no rashes Neurological: no tremor with outstretched hands  ASSESSMENT: 1. DM2, non-insulin-dependent, uncontrolled,  without complications - CAD - s/p CABG in 2007 - CKD - PN - DR  2. HL  3. Obesity class I  PLAN:  1. Patient with longstanding, uncontrolled, type 2 diabetes, on metformin ER, SGLT2 inhibitor and weekly GLP-1 receptor agonist, with all last visit, HbA1c was better, at 6.8%, but at the beginning of this month, he had another HbA1c and this was even lower, at 6.6%.  At last visit sugars were well controlled except for high blood sugars after breakfast when he was eating cereal/discussed about stopping these. CGM interpretation: -At today's visit, we reviewed his CGM downloads: It appears that 76% of values are in target range (goal >70%), while 24% are higher than 180 (goal <25%), and 0% are lower than 70 (goal <4%).  The calculated average blood sugar is 159.  The projected HbA1c for the next 3 months (GMI) is 7.1%. -Reviewing the CGM trends, sugars are fairly well controlled in the second half of the night but they did increase slowly after his breakfast and then more significantly so after lunch and dinner.  However, he is not moving much now, after the surgery, and he was also off his medicines for several days before surgery.  In the light of the good HbA1c (see below) and the only mildly elevated blood sugars, I recommended to continue the current regimen. - I suggested to:  Patient Instructions  Please continue:  - Metformin ER 1000 mg 2x a Love, with meals - Jardiance 25 mg before b'fast - Ozempic 1 mg weekly   Please return in 4 months.  - we  checked his HbA1c: 6.7% (very slightly higher) - advised to check sugars at different times of the Love - 4x a Love, rotating check times - advised for yearly eye exams >> he is UTD - return to clinic in 4 months  2.  Hyperlipidemia -Reviewed latest lipid panel from 06/2021: Fractions at goal with the exception of a low HDL: Lab Results  Component Value Date   CHOL 87 07/04/2021   HDL 31.60 (L) 07/04/2021   LDLCALC 25 07/04/2021    LDLDIRECT 51 06/21/2017   TRIG 152.0 (H) 07/04/2021   CHOLHDL 3 07/04/2021  -He continues only with 40 mg daily, omega-3 fatty acids 3 g twice a Love  3.  Obesity class I -continue SGLT 2 inhibitor and GLP-1 receptor agonist which should also help with weight loss -before last visit, he lost 13 pounds! -He lost 3 more pounds since last visit  Philemon Kingdom, MD PhD [facility] Endocrinology

## 2022-06-19 NOTE — Patient Instructions (Addendum)
Please continue:  - Metformin ER 1000 mg 2x a day, with meals - Jardiance 25 mg before b'fast - Ozempic 1 mg weekly   Please return in 4 months.

## 2022-06-27 DIAGNOSIS — E1159 Type 2 diabetes mellitus with other circulatory complications: Secondary | ICD-10-CM | POA: Diagnosis not present

## 2022-06-29 DIAGNOSIS — M5451 Vertebrogenic low back pain: Secondary | ICD-10-CM | POA: Diagnosis not present

## 2022-06-30 ENCOUNTER — Other Ambulatory Visit: Payer: Self-pay | Admitting: Emergency Medicine

## 2022-06-30 ENCOUNTER — Encounter: Payer: Self-pay | Admitting: Internal Medicine

## 2022-06-30 DIAGNOSIS — I10 Essential (primary) hypertension: Secondary | ICD-10-CM

## 2022-07-05 ENCOUNTER — Encounter: Payer: Medicare PPO | Admitting: Emergency Medicine

## 2022-07-06 ENCOUNTER — Encounter: Payer: Self-pay | Admitting: Emergency Medicine

## 2022-07-06 ENCOUNTER — Ambulatory Visit (INDEPENDENT_AMBULATORY_CARE_PROVIDER_SITE_OTHER): Payer: Medicare PPO | Admitting: Emergency Medicine

## 2022-07-06 VITALS — BP 118/64 | HR 82 | Temp 97.8°F | Ht 70.0 in | Wt 198.0 lb

## 2022-07-06 DIAGNOSIS — E1169 Type 2 diabetes mellitus with other specified complication: Secondary | ICD-10-CM

## 2022-07-06 DIAGNOSIS — Z125 Encounter for screening for malignant neoplasm of prostate: Secondary | ICD-10-CM

## 2022-07-06 DIAGNOSIS — E1159 Type 2 diabetes mellitus with other circulatory complications: Secondary | ICD-10-CM | POA: Diagnosis not present

## 2022-07-06 DIAGNOSIS — I152 Hypertension secondary to endocrine disorders: Secondary | ICD-10-CM | POA: Diagnosis not present

## 2022-07-06 DIAGNOSIS — Z13220 Encounter for screening for lipoid disorders: Secondary | ICD-10-CM | POA: Diagnosis not present

## 2022-07-06 DIAGNOSIS — Z130 Encounter for screening for diseases of the blood and blood-forming organs and certain disorders involving the immune mechanism: Secondary | ICD-10-CM | POA: Diagnosis not present

## 2022-07-06 DIAGNOSIS — E785 Hyperlipidemia, unspecified: Secondary | ICD-10-CM | POA: Diagnosis not present

## 2022-07-06 DIAGNOSIS — I2510 Atherosclerotic heart disease of native coronary artery without angina pectoris: Secondary | ICD-10-CM | POA: Diagnosis not present

## 2022-07-06 DIAGNOSIS — M5451 Vertebrogenic low back pain: Secondary | ICD-10-CM | POA: Diagnosis not present

## 2022-07-06 DIAGNOSIS — Z13228 Encounter for screening for other metabolic disorders: Secondary | ICD-10-CM

## 2022-07-06 DIAGNOSIS — Z1329 Encounter for screening for other suspected endocrine disorder: Secondary | ICD-10-CM | POA: Diagnosis not present

## 2022-07-06 DIAGNOSIS — Z0000 Encounter for general adult medical examination without abnormal findings: Secondary | ICD-10-CM | POA: Diagnosis not present

## 2022-07-06 LAB — CBC WITH DIFFERENTIAL/PLATELET
Basophils Absolute: 0 10*3/uL (ref 0.0–0.1)
Basophils Relative: 0.7 % (ref 0.0–3.0)
Eosinophils Absolute: 0.1 10*3/uL (ref 0.0–0.7)
Eosinophils Relative: 1.5 % (ref 0.0–5.0)
HCT: 37.9 % — ABNORMAL LOW (ref 39.0–52.0)
Hemoglobin: 12.6 g/dL — ABNORMAL LOW (ref 13.0–17.0)
Lymphocytes Relative: 24.3 % (ref 12.0–46.0)
Lymphs Abs: 1.4 10*3/uL (ref 0.7–4.0)
MCHC: 33.2 g/dL (ref 30.0–36.0)
MCV: 84.9 fl (ref 78.0–100.0)
Monocytes Absolute: 0.4 10*3/uL (ref 0.1–1.0)
Monocytes Relative: 7.7 % (ref 3.0–12.0)
Neutro Abs: 3.7 10*3/uL (ref 1.4–7.7)
Neutrophils Relative %: 65.8 % (ref 43.0–77.0)
Platelets: 208 10*3/uL (ref 150.0–400.0)
RBC: 4.46 Mil/uL (ref 4.22–5.81)
RDW: 14.6 % (ref 11.5–15.5)
WBC: 5.6 10*3/uL (ref 4.0–10.5)

## 2022-07-06 LAB — COMPREHENSIVE METABOLIC PANEL
ALT: 15 U/L (ref 0–53)
AST: 13 U/L (ref 0–37)
Albumin: 4.5 g/dL (ref 3.5–5.2)
Alkaline Phosphatase: 43 U/L (ref 39–117)
BUN: 10 mg/dL (ref 6–23)
CO2: 29 mEq/L (ref 19–32)
Calcium: 9.4 mg/dL (ref 8.4–10.5)
Chloride: 105 mEq/L (ref 96–112)
Creatinine, Ser: 1 mg/dL (ref 0.40–1.50)
GFR: 78.72 mL/min (ref >60.00)
Glucose, Bld: 168 mg/dL — ABNORMAL HIGH (ref 70–99)
Potassium: 3.7 mEq/L (ref 3.5–5.1)
Sodium: 143 mEq/L (ref 135–145)
Total Bilirubin: 0.5 mg/dL (ref 0.2–1.2)
Total Protein: 6.7 g/dL (ref 6.0–8.3)

## 2022-07-06 LAB — PSA: PSA: 0.84 ng/mL (ref 0.10–4.00)

## 2022-07-06 LAB — LIPID PANEL
Cholesterol: 77 mg/dL (ref 0–200)
HDL: 30.5 mg/dL — ABNORMAL LOW (ref >39.00)
NonHDL: 46.62
Total CHOL/HDL Ratio: 3
Triglycerides: 314 mg/dL — ABNORMAL HIGH (ref 0.0–149.0)
VLDL: 62.8 mg/dL — ABNORMAL HIGH (ref 0.0–40.0)

## 2022-07-06 LAB — HEMOGLOBIN A1C: Hgb A1c MFr Bld: 6.8 % — ABNORMAL HIGH (ref 4.6–6.5)

## 2022-07-06 LAB — LDL CHOLESTEROL, DIRECT: Direct LDL: 21 mg/dL

## 2022-07-06 NOTE — Progress Notes (Signed)
[name] Love 66 y.o.   Chief Complaint  Patient presents with   Annual Exam    Wants to know if he should get the RSV shot    HISTORY OF PRESENT ILLNESS: This is a 66 y.o. male here for annual exam. History of hypertension, diabetes, dyslipidemia.  Sees endocrinologist Dr. Cruzita Lederer on a regular basis. History of coronary artery disease status post CABG in the past, sees cardiologist on a regular basis. Recent low back surgery about 4 weeks ago.  Doing well.   HPI   Prior to Admission medications   Medication Sig Start Date End Date Taking? Authorizing Provider  acetaminophen (TYLENOL) 500 MG tablet Take 1,000 mg by mouth every 8 (eight) hours as needed for moderate pain.   Yes [provider]  ascorbic acid (VITAMIN C) 500 MG tablet Take 500 mg by mouth daily.   Yes [provider]  atorvastatin (LIPITOR) 40 MG tablet Take 40 mg by mouth daily. 06/24/22  Yes [provider]  empagliflozin (JARDIANCE) 25 MG TABS tablet Take 1 tablet (25 mg total) by mouth daily before breakfast. 02/06/22  Yes Philemon Kingdom, MD  ferrous sulfate 325 (65 FE) MG EC tablet Take 325 mg by mouth daily.   Yes [provider]  losartan (COZAAR) 50 MG tablet Take 1 tablet by mouth once daily 06/30/22  Yes Benjamim Harnish, [name], MD  Magnesium Glycinate 665 MG CAPS Take 1 tablet by mouth in the morning and at bedtime.   Yes [provider]  metFORMIN (GLUCOPHAGE-XR) 500 MG 24 hr tablet TAKE 2 TABLETS BY MOUTH IN THE MORNING AND 2 AT BEDTIME 05/08/22  Yes Philemon Kingdom, MD  methocarbamol (ROBAXIN-750) 750 MG tablet Take 1 tablet (750 mg total) by mouth every 8 (eight) hours as needed for muscle spasms. 06/08/22  Yes Susa Day, MD  omeprazole (PRILOSEC) 20 MG capsule Take 1 capsule (20 mg total) by mouth 2 (two) times daily before a meal. 03/31/22  Yes Daryel November, MD  OZEMPIC, 1 MG/DOSE, 4 MG/3ML SOPN INJECT 1 MG SUBCUTANEOUSLY ONCE A WEEK  06/07/22  Yes Philemon Kingdom, MD  POTASSIUM GLUCONATE PO Take 650 mg by mouth 2 (two) times daily.   Yes [provider]    No Known Allergies  Patient Active Problem List   Diagnosis Date Noted   Spinal stenosis at L4-L5 level 06/08/2022   Lumbar spondylosis 05/21/2022   Skin lesion 04/17/2022   Lumbar radiculopathy 08/05/2021   Pain of left hip joint 07/07/2021   Degeneration of thoracic intervertebral disc 06/29/2020   Pain in thoracic spine 06/29/2020   Tendinitis of hip 10/09/2017   Decreased calculated GFR 08/08/2017   Bilateral adhesive capsulitis of shoulders 07/26/2017   Erectile dysfunction 09/15/2013   Nonspecific abnormal electrocardiogram (ECG) (EKG) 06/10/2013   Poorly controlled type 2 diabetes mellitus with circulatory disorder ([place]) 09/12/2012   Coronary artery disease 07/14/2011   History of coronary artery bypass surgery 07/14/2011   Hyperlipidemia 02/23/2009   Hypertension associated with diabetes ([place]) 02/23/2009    Past Medical History:  Diagnosis Date   Anemia    Arthritis    Cancer ([place])    skin cancer on left posterior neck - excised   Coronary artery disease    post CABG x3 in 2007   Diabetes mellitus    type 2   GERD (gastroesophageal reflux disease)    Headache    no current problem   Hearing loss    some hearing loss bilateral -  no hearing aids   History of kidney stones 1990   passed stone   Hyperlipidemia    Hypertension    Obesity     Past Surgical History:  Procedure Laterality Date   CARDIAC CATHETERIZATION  EF= 45-50%   EF= 45-50% -- Three-vessel coronary artery disease with high-grade lesions in the proximal left anterior descending artery and multiple lesions in the right coronary -- Low normal ejection fraction -- Stable exertional angina -- [name], M.D. [facility]   COLONOSCOPY  09/26/2021   CORONARY ARTERY BYPASS GRAFT  02/19/2006    x3 (left internal mammary artery to LAD, left radial artery to posterior  descending, saphenous vein graft to first diagonal), endoscopic vein harvest right leg -- SURGEON:  [name]. [name], M.D.   HEMI-MICRODISCECTOMY LUMBAR LAMINECTOMY LEVEL 1 Left 06/08/2022   Procedure: Micro hemi laminectomy Lumbar Five-Sacral One left;  Surgeon: Susa Day, MD;  Location: [facility];  Service: Orthopedics;  Laterality: Left;  90 mins 3 C-Bed   HERNIA REPAIR     at 66 y/o   SHOULDER ARTHROSCOPY Right 2007   tripple bypass     UPPER GASTROINTESTINAL ENDOSCOPY  09/26/2021    Social History   Socioeconomic History   Marital status: Married    Spouse name: Not on file   Number of children: 3   Years of education: Not on file   Highest education level: Not on file  Occupational History   Occupation: retired  Tobacco Use   Smoking status: Never   Smokeless tobacco: Never  Vaping Use   Vaping Use: Never used  Substance and Sexual Activity   Alcohol use: Yes    Alcohol/week: 1.0 standard drink of alcohol    Types: 1 Standard drinks or equivalent per week    Comment: 1 drink monthly   Drug use: Never   Sexual activity: Yes  Other Topics Concern   Not on file  Social History Narrative   Not on file   Social Determinants of Health   Financial Resource Strain: Not on file  Food Insecurity: Not on file  Transportation Needs: Not on file  Physical Activity: Not on file  Stress: Not on file  Social Connections: Not on file  Intimate Partner Violence: Not on file    Family History  Problem Relation Age of Onset   Heart disease Mother    Colon cancer Mother        75   Hypertension Mother    Cancer Father        type unknown   Kidney disease Maternal Aunt    Diabetes Maternal Uncle    Heart attack Maternal Grandfather 72   Colon polyps Neg Hx    Esophageal cancer Neg Hx    Rectal cancer Neg Hx    Stomach cancer Neg Hx      Review of Systems  Constitutional: Negative.  Negative for chills and fever.  HENT: Negative.  Negative for congestion and  sore throat.   Respiratory: Negative.  Negative for cough and shortness of breath.   Cardiovascular: Negative.  Negative for chest pain and palpitations.  Gastrointestinal:  Negative for abdominal pain, diarrhea, nausea and vomiting.  Genitourinary: Negative.  Negative for dysuria and hematuria.  Musculoskeletal:  Positive for back pain.  Skin: Negative.  Negative for rash.  Neurological: Negative.  Negative for dizziness and headaches.  All other systems reviewed and are negative.  Vitals:   07/06/22 1457  BP: 118/64  Pulse: 82  Temp: 97.8  F (36.6 C)  SpO2: 99%     Physical Exam Vitals reviewed.  Constitutional:      Appearance: Normal appearance.  HENT:     Head: Normocephalic.     Right Ear: Tympanic membrane, ear canal and external ear normal.     Left Ear: Tympanic membrane, ear canal and external ear normal.     Mouth/Throat:     Mouth: Mucous membranes are moist.     Pharynx: Oropharynx is clear.  Eyes:     Extraocular Movements: Extraocular movements intact.     Conjunctiva/sclera: Conjunctivae normal.     Pupils: Pupils are equal, round, and reactive to light.  Cardiovascular:     Rate and Rhythm: Normal rate and regular rhythm.     Pulses: Normal pulses.     Heart sounds: Normal heart sounds.  Pulmonary:     Effort: Pulmonary effort is normal.     Breath sounds: Normal breath sounds.  Musculoskeletal:     Cervical back: No tenderness.  Lymphadenopathy:     Cervical: No cervical adenopathy.  Neurological:     Mental Status: He is alert.      ASSESSMENT & PLAN: Problem List Items Addressed This Visit       Cardiovascular and Mediastinum   Hypertension associated with diabetes ([place])   Relevant Medications   atorvastatin (LIPITOR) 40 MG tablet   Other Relevant Orders   Comprehensive metabolic panel   Hemoglobin A1c   Coronary artery disease   Relevant Medications   atorvastatin (LIPITOR) 40 MG tablet   Other Visit Diagnoses     Routine  general medical examination at a health care facility    -  Primary   Relevant Orders   CBC with Differential   Dyslipidemia associated with type 2 diabetes mellitus (HCC)       Relevant Medications   atorvastatin (LIPITOR) 40 MG tablet   Other Relevant Orders   Comprehensive metabolic panel   Hemoglobin A1c   Lipid panel   Prostate cancer screening       Relevant Orders   PSA(Must document that pt has been informed of limitations of PSA testing.)   Screening for deficiency anemia       Relevant Orders   CBC with Differential   Screening for lipoid disorders       Screening for endocrine, metabolic and immunity disorder       Relevant Orders   Comprehensive metabolic panel      Modifiable risk factors discussed with patient. Anticipatory guidance according to age provided. The following topics were also discussed: Social Determinants of Health Smoking.  Non-smoker Diet and nutrition Benefits of exercise Cancer screening and review of most recent colonoscopy report Vaccinations reviewed and recommendations Cardiovascular risk assessment and need for blood work Review of chronic medical conditions and review of all medications Mental health including depression and anxiety Fall and accident prevention  Patient Instructions  Health Maintenance, Male Adopting a healthy lifestyle and getting preventive care are important in promoting health and wellness. Ask your health care provider about: The right schedule for you to have regular tests and exams. Things you can do on your own to prevent diseases and keep yourself healthy. What should I know about diet, weight, and exercise? Eat a healthy diet  Eat a diet that includes plenty of vegetables, fruits, low-fat dairy products, and lean protein. Do not eat a lot of foods that are high in solid fats, added sugars, or sodium. Maintain a healthy weight  Body mass index (BMI) is a measurement that can be used to identify possible  weight problems. It estimates body fat based on height and weight. Your health care provider can help determine your BMI and help you achieve or maintain a healthy weight. Get regular exercise Get regular exercise. This is one of the most important things you can do for your health. Most adults should: Exercise for at least 150 minutes each week. The exercise should increase your heart rate and make you sweat (moderate-intensity exercise). Do strengthening exercises at least twice a week. This is in addition to the moderate-intensity exercise. Spend less time sitting. Even light physical activity can be beneficial. Watch cholesterol and blood lipids Have your blood tested for lipids and cholesterol at 66 years of age, then have this test every 5 years. You may need to have your cholesterol levels checked more often if: Your lipid or cholesterol levels are high. You are older than 66 years of age. You are at high risk for heart disease. What should I know about cancer screening? Many types of cancers can be detected early and may often be prevented. Depending on your health history and family history, you may need to have cancer screening at various ages. This may include screening for: Colorectal cancer. Prostate cancer. Skin cancer. Lung cancer. What should I know about heart disease, diabetes, and high blood pressure? Blood pressure and heart disease High blood pressure causes heart disease and increases the risk of stroke. This is more likely to develop in people who have high blood pressure readings or are overweight. Talk with your health care provider about your target blood pressure readings. Have your blood pressure checked: Every 3-5 years if you are 34-18 years of age. Every year if you are 65 years old or older. If you are between the ages of 53 and 17 and are a current or former smoker, ask your health care provider if you should have a one-time screening for abdominal aortic  aneurysm (AAA). Diabetes Have regular diabetes screenings. This checks your fasting blood sugar level. Have the screening done: Once every three years after age 32 if you are at a normal weight and have a low risk for diabetes. More often and at a younger age if you are overweight or have a high risk for diabetes. What should I know about preventing infection? Hepatitis B If you have a higher risk for hepatitis B, you should be screened for this virus. Talk with your health care provider to find out if you are at risk for hepatitis B infection. Hepatitis C Blood testing is recommended for: Everyone born from [birth year] through 1965. Anyone with known risk factors for hepatitis C. Sexually transmitted infections (STIs) You should be screened each year for STIs, including gonorrhea and chlamydia, if: You are sexually active and are younger than 66 years of age. You are older than 66 years of age and your health care provider tells you that you are at risk for this type of infection. Your sexual activity has changed since you were last screened, and you are at increased risk for chlamydia or gonorrhea. Ask your health care provider if you are at risk. Ask your health care provider about whether you are at high risk for HIV. Your health care provider may recommend a prescription medicine to help prevent HIV infection. If you choose to take medicine to prevent HIV, you should first get tested for HIV. You should then be tested every 3 months for  as long as you are taking the medicine. Follow these instructions at home: Alcohol use Do not drink alcohol if your health care provider tells you not to drink. If you drink alcohol: Limit how much you have to 0-2 drinks a day. Know how much alcohol is in your drink. In the [place] one drink equals one 12 oz bottle of beer (355 mL), one 5 oz glass of wine (148 mL), or one 1 oz glass of hard liquor (44 mL). Lifestyle Do not use any products that contain nicotine  or tobacco. These products include cigarettes, chewing tobacco, and vaping devices, such as e-cigarettes. If you need help quitting, ask your health care provider. Do not use street drugs. Do not share needles. Ask your health care provider for help if you need support or information about quitting drugs. General instructions Schedule regular health, dental, and eye exams. Stay current with your vaccines. Tell your health care provider if: You often feel depressed. You have ever been abused or do not feel safe at home. Summary Adopting a healthy lifestyle and getting preventive care are important in promoting health and wellness. Follow your health care provider's instructions about healthy diet, exercising, and getting tested or screened for diseases. Follow your health care provider's instructions on monitoring your cholesterol and blood pressure. This information is not intended to replace advice given to you by your health care provider. Make sure you discuss any questions you have with your health care provider. Document Revised: 11/29/2020 Document Reviewed: 11/29/2020 Elsevier Patient Education  [place], MD [facility]

## 2022-07-06 NOTE — Patient Instructions (Signed)
Health Maintenance, Male Adopting a healthy lifestyle and getting preventive care are important in promoting health and wellness. Ask your health care provider about: The right schedule for you to have regular tests and exams. Things you can do on your own to prevent diseases and keep yourself healthy. What should I know about diet, weight, and exercise? Eat a healthy diet  Eat a diet that includes plenty of vegetables, fruits, low-fat dairy products, and lean protein. Do not eat a lot of foods that are high in solid fats, added sugars, or sodium. Maintain a healthy weight Body mass index (BMI) is a measurement that can be used to identify possible weight problems. It estimates body fat based on height and weight. Your health care provider can help determine your BMI and help you achieve or maintain a healthy weight. Get regular exercise Get regular exercise. This is one of the most important things you can do for your health. Most adults should: Exercise for at least 150 minutes each week. The exercise should increase your heart rate and make you sweat (moderate-intensity exercise). Do strengthening exercises at least twice a week. This is in addition to the moderate-intensity exercise. Spend less time sitting. Even light physical activity can be beneficial. Watch cholesterol and blood lipids Have your blood tested for lipids and cholesterol at 66 years of age, then have this test every 5 years. You may need to have your cholesterol levels checked more often if: Your lipid or cholesterol levels are high. You are older than 66 years of age. You are at high risk for heart disease. What should I know about cancer screening? Many types of cancers can be detected early and may often be prevented. Depending on your health history and family history, you may need to have cancer screening at various ages. This may include screening for: Colorectal cancer. Prostate cancer. Skin cancer. Lung  cancer. What should I know about heart disease, diabetes, and high blood pressure? Blood pressure and heart disease High blood pressure causes heart disease and increases the risk of stroke. This is more likely to develop in people who have high blood pressure readings or are overweight. Talk with your health care provider about your target blood pressure readings. Have your blood pressure checked: Every 3-5 years if you are 18-39 years of age. Every year if you are 40 years old or older. If you are between the ages of 65 and 75 and are a current or former smoker, ask your health care provider if you should have a one-time screening for abdominal aortic aneurysm (AAA). Diabetes Have regular diabetes screenings. This checks your fasting blood sugar level. Have the screening done: Once every three years after age 45 if you are at a normal weight and have a low risk for diabetes. More often and at a younger age if you are overweight or have a high risk for diabetes. What should I know about preventing infection? Hepatitis B If you have a higher risk for hepatitis B, you should be screened for this virus. Talk with your health care provider to find out if you are at risk for hepatitis B infection. Hepatitis C Blood testing is recommended for: Everyone born from 1945 through 1965. Anyone with known risk factors for hepatitis C. Sexually transmitted infections (STIs) You should be screened each year for STIs, including gonorrhea and chlamydia, if: You are sexually active and are younger than 66 years of age. You are older than 66 years of age and your   health care provider tells you that you are at risk for this type of infection. Your sexual activity has changed since you were last screened, and you are at increased risk for chlamydia or gonorrhea. Ask your health care provider if you are at risk. Ask your health care provider about whether you are at high risk for HIV. Your health care provider  may recommend a prescription medicine to help prevent HIV infection. If you choose to take medicine to prevent HIV, you should first get tested for HIV. You should then be tested every 3 months for as long as you are taking the medicine. Follow these instructions at home: Alcohol use Do not drink alcohol if your health care provider tells you not to drink. If you drink alcohol: Limit how much you have to 0-2 drinks a day. Know how much alcohol is in your drink. In the [place] one drink equals one 12 oz bottle of beer (355 mL), one 5 oz glass of wine (148 mL), or one 1 oz glass of hard liquor (44 mL). Lifestyle Do not use any products that contain nicotine or tobacco. These products include cigarettes, chewing tobacco, and vaping devices, such as e-cigarettes. If you need help quitting, ask your health care provider. Do not use street drugs. Do not share needles. Ask your health care provider for help if you need support or information about quitting drugs. General instructions Schedule regular health, dental, and eye exams. Stay current with your vaccines. Tell your health care provider if: You often feel depressed. You have ever been abused or do not feel safe at home. Summary Adopting a healthy lifestyle and getting preventive care are important in promoting health and wellness. Follow your health care provider's instructions about healthy diet, exercising, and getting tested or screened for diseases. Follow your health care provider's instructions on monitoring your cholesterol and blood pressure. This information is not intended to replace advice given to you by your health care provider. Make sure you discuss any questions you have with your health care provider. Document Revised: 11/29/2020 Document Reviewed: 11/29/2020 Elsevier Patient Education  2023 Elsevier Inc[employer]  

## 2022-07-07 ENCOUNTER — Encounter: Payer: Self-pay | Admitting: Emergency Medicine

## 2022-07-10 NOTE — Telephone Encounter (Signed)
Thank you :)

## 2022-07-12 ENCOUNTER — Other Ambulatory Visit: Payer: Self-pay

## 2022-07-12 DIAGNOSIS — D509 Iron deficiency anemia, unspecified: Secondary | ICD-10-CM

## 2022-07-12 NOTE — Progress Notes (Signed)
[name] Love,  Your repeat CBC showed a drop in your hemoglobin from a month ago, but I see you had spine surgery last month.  That is the most likely cause of the drop in your hemoglobin.  I would continue to take the iron/vit C and plan to repeat a CBC again in 4 months.  Please let us know if you think you have any blood in your stool.  Hope you have a good holiday season

## 2022-07-20 ENCOUNTER — Ambulatory Visit (AMBULATORY_SURGERY_CENTER): Payer: Medicare PPO | Admitting: *Deleted

## 2022-07-20 VITALS — Ht 70.5 in | Wt 195.0 lb

## 2022-07-20 DIAGNOSIS — M47816 Spondylosis without myelopathy or radiculopathy, lumbar region: Secondary | ICD-10-CM | POA: Diagnosis not present

## 2022-07-20 DIAGNOSIS — K2210 Ulcer of esophagus without bleeding: Secondary | ICD-10-CM

## 2022-07-20 NOTE — Progress Notes (Signed)
No egg or soy allergy known to patient  No issues known to pt with past sedation with any surgeries or procedures Patient denies ever being told they had issues or difficulty with intubation  No FH of Malignant Hyperthermia Pt is not on diet pills Pt is not on  home 02  Pt is not on blood thinners  Pt denies issues with constipation  Pt is not on dialysis Pt denies any upcoming cardiac testing Pt encouraged to use to use Singlecare or Goodrx to reduce cost  Patient's chart reviewed by Osvaldo Angst CNRA prior to previsit and patient appropriate for the [facility].  Previsit completed and red dot placed by patient's name on their procedure day (on provider's schedule).  . Pre-visit by phone Instructions sent by mail

## 2022-07-25 ENCOUNTER — Other Ambulatory Visit: Payer: Self-pay | Admitting: Interventional Cardiology

## 2022-08-03 DIAGNOSIS — M7701 Medial epicondylitis, right elbow: Secondary | ICD-10-CM | POA: Diagnosis not present

## 2022-08-03 DIAGNOSIS — M47816 Spondylosis without myelopathy or radiculopathy, lumbar region: Secondary | ICD-10-CM | POA: Diagnosis not present

## 2022-08-04 DIAGNOSIS — M545 Low back pain, unspecified: Secondary | ICD-10-CM | POA: Diagnosis not present

## 2022-08-09 ENCOUNTER — Other Ambulatory Visit: Payer: Self-pay | Admitting: Gastroenterology

## 2022-08-15 ENCOUNTER — Encounter: Payer: Self-pay | Admitting: Gastroenterology

## 2022-08-21 ENCOUNTER — Ambulatory Visit (AMBULATORY_SURGERY_CENTER): Payer: Medicare PPO | Admitting: Gastroenterology

## 2022-08-21 ENCOUNTER — Encounter: Payer: Self-pay | Admitting: Gastroenterology

## 2022-08-21 VITALS — BP 128/70 | HR 70 | Temp 96.0°F | Resp 14 | Ht 70.0 in | Wt 195.0 lb

## 2022-08-21 DIAGNOSIS — K2210 Ulcer of esophagus without bleeding: Secondary | ICD-10-CM | POA: Diagnosis not present

## 2022-08-21 DIAGNOSIS — D500 Iron deficiency anemia secondary to blood loss (chronic): Secondary | ICD-10-CM | POA: Diagnosis not present

## 2022-08-21 DIAGNOSIS — Z8711 Personal history of peptic ulcer disease: Secondary | ICD-10-CM | POA: Diagnosis not present

## 2022-08-21 MED ORDER — SODIUM CHLORIDE 0.9 % IV SOLN: 500.0000 mL | Freq: Once | INTRAVENOUS | Status: DC

## 2022-08-21 NOTE — Op Note (Signed)
[place] Patient Name: [name] Love Procedure Date: 08/21/2022 10:21 AM MRN: [id number] Endoscopist: [name] Love , MD, [id number] Age: 67 Referring MD:  Date of Birth: Jul 30, 1955 Gender: Male Account #: [id number] Procedure:                Upper GI endoscopy Indications:              Follow-up of gastric ulcers/erosions Medicines:                Monitored Anesthesia Care Procedure:                Pre-Anesthesia Assessment:                           - Prior to the procedure, a History and Physical                            was performed, and patient medications and                            allergies were reviewed. The patient's tolerance of                            previous anesthesia was also reviewed. The risks                            and benefits of the procedure and the sedation                            options and risks were discussed with the patient.                            All questions were answered, and informed consent                            was obtained. Prior Anticoagulants: The patient has                            taken no anticoagulant or antiplatelet agents. ASA                            Grade Assessment: II - A patient with mild systemic                            disease. After reviewing the risks and benefits,                            the patient was deemed in satisfactory condition to                            undergo the procedure.                           After obtaining informed consent, the endoscope was  passed under direct vision. Throughout the                            procedure, the patient's blood pressure, pulse, and                            oxygen saturations were monitored continuously. The                            GIF [id number] #[id number] was introduced through the                            mouth, with the intention of advancing to the                            stomach.  The scope was advanced to the antrum                            before the procedure was aborted. Medications were                            given. The procedure was aborted due to presence of                            food. Scope In: Scope Out: Findings:                 The examined esophagus was normal.                           A large amount of food (residue) was found in the                            gastric body.                           The exam of the stomach was otherwise normal. Complications:            No immediate complications. Estimated Blood Loss:     Estimated blood loss: none. Impression:               - The procedure was aborted due to presence of food                            in the stomach.                           - Normal esophagus.                           - A large amount of food (residue) in the stomach.                           - No specimens collected. Recommendation:           - Patient has a contact number available for  emergencies. The signs and symptoms of potential                            delayed complications were discussed with the                            patient. Return to normal activities tomorrow.                            Written discharge instructions were provided to the                            patient.                           - Resume previous diet.                           - Continue present medications.                           - Repeat upper endoscopy at appointment to be                            scheduled because of aborted procedure and                            inadequate visualization due to food in the stomach.                           - Recommend 24 hours of clear liquid diet for next                            EGD. [name], MD 08/21/2022 10:46:47 AM This report has been signed electronically.

## 2022-08-21 NOTE — Progress Notes (Signed)
[facility] History and Physical   Primary Care Physician:  [name], MD   Reason for Procedure:   Iron deficiency anemia, history of gastric ulcers  Plan:    EGD     HPI: [name] Love is a 67 y.o. male undergoing EGD to evaluate recurrent iron deficiency anemia.  He has a history of multiple superficial gastric ulcers in March which were persistent in May.  He takes Prilosec daily.  He has stopped taking aspirin.  He denies any GI symptoms.  Past Medical History:  Diagnosis Date   Anemia    Arthritis    Cancer ([place])    skin cancer on left posterior neck - excised   Cataract    Coronary artery disease    post CABG x3 in 2007   Diabetes mellitus    type 2   GERD (gastroesophageal reflux disease)    Headache    no current problem   Hearing loss    some hearing loss bilateral - no hearing aids   History of kidney stones 1990   passed stone   Hyperlipidemia    Hypertension    Obesity     Past Surgical History:  Procedure Laterality Date   CARDIAC CATHETERIZATION  EF= 45-50%   EF= 45-50% -- Three-vessel coronary artery disease with high-grade lesions in the proximal left anterior descending artery and multiple lesions in the right coronary -- Low normal ejection fraction -- Stable exertional angina -- [name], M.D. [facility]   COLONOSCOPY  09/26/2021   CORONARY ARTERY BYPASS GRAFT  02/19/2006    x3 (left internal mammary artery to LAD, left radial artery to posterior descending, saphenous vein graft to first diagonal), endoscopic vein harvest right leg -- SURGEON:  [name]. [name], M.D.   HEMI-MICRODISCECTOMY LUMBAR LAMINECTOMY LEVEL 1 Left 06/08/2022   Procedure: Micro hemi laminectomy Lumbar Five-Sacral One left;  Surgeon: Susa Day, MD;  Location: [facility];  Service: Orthopedics;  Laterality: Left;  90 mins 3 C-Bed   HERNIA REPAIR     at 67 y/o   SHOULDER ARTHROSCOPY Right 2007   tripple bypass     UPPER  GASTROINTESTINAL ENDOSCOPY  09/26/2021    Prior to Admission medications   Medication Sig Start Date End Date Taking? Authorizing Provider  acetaminophen (TYLENOL) 500 MG tablet Take 1,000 mg by mouth every 8 (eight) hours as needed for moderate pain.   Yes [provider]  ascorbic acid (VITAMIN C) 500 MG tablet Take 500 mg by mouth daily.   Yes [provider]  atorvastatin (LIPITOR) 40 MG tablet Take 1 tablet by mouth once daily 07/26/22  Yes [name], MD  empagliflozin (JARDIANCE) 25 MG TABS tablet Take 1 tablet (25 mg total) by mouth daily before breakfast. 02/06/22  Yes Philemon Kingdom, MD  ferrous sulfate 325 (65 FE) MG EC tablet Take 325 mg by mouth daily.   Yes [provider]  losartan (COZAAR) 50 MG tablet Take 1 tablet by mouth once daily 06/30/22  Yes [name], [name], MD  Magnesium Glycinate 665 MG CAPS Take 1 tablet by mouth in the morning and at bedtime.   Yes [provider]  metFORMIN (GLUCOPHAGE-XR) 500 MG 24 hr tablet TAKE 2 TABLETS BY MOUTH IN THE MORNING AND 2 AT BEDTIME 05/08/22  Yes Philemon Kingdom, MD  omeprazole (PRILOSEC) 20 MG capsule TAKE 1 CAPSULE BY MOUTH TWICE DAILY BEFORE A MEAL 08/09/22  Yes Daryel November, MD  OZEMPIC, 1 MG/DOSE, 4 MG/3ML  SOPN INJECT 1 MG SUBCUTANEOUSLY ONCE A WEEK 06/07/22  Yes Philemon Kingdom, MD  POTASSIUM GLUCONATE PO Take 650 mg by mouth 2 (two) times daily.   Yes [provider]  methocarbamol (ROBAXIN-750) 750 MG tablet Take 1 tablet (750 mg total) by mouth every 8 (eight) hours as needed for muscle spasms. 06/08/22   Susa Day, MD    Current Outpatient Medications  Medication Sig Dispense Refill   acetaminophen (TYLENOL) 500 MG tablet Take 1,000 mg by mouth every 8 (eight) hours as needed for moderate pain.     ascorbic acid (VITAMIN C) 500 MG tablet Take 500 mg by mouth daily.     atorvastatin (LIPITOR) 40 MG tablet Take 1 tablet by mouth once daily 90 tablet 2    empagliflozin (JARDIANCE) 25 MG TABS tablet Take 1 tablet (25 mg total) by mouth daily before breakfast. 90 tablet 3   ferrous sulfate 325 (65 FE) MG EC tablet Take 325 mg by mouth daily.     losartan (COZAAR) 50 MG tablet Take 1 tablet by mouth once daily 90 tablet 0   Magnesium Glycinate 665 MG CAPS Take 1 tablet by mouth in the morning and at bedtime.     metFORMIN (GLUCOPHAGE-XR) 500 MG 24 hr tablet TAKE 2 TABLETS BY MOUTH IN THE MORNING AND 2 AT BEDTIME 360 tablet 0   omeprazole (PRILOSEC) 20 MG capsule TAKE 1 CAPSULE BY MOUTH TWICE DAILY BEFORE A MEAL 60 capsule 0   OZEMPIC, 1 MG/DOSE, 4 MG/3ML SOPN INJECT 1 MG SUBCUTANEOUSLY ONCE A WEEK 9 mL 0   POTASSIUM GLUCONATE PO Take 650 mg by mouth 2 (two) times daily.     methocarbamol (ROBAXIN-750) 750 MG tablet Take 1 tablet (750 mg total) by mouth every 8 (eight) hours as needed for muscle spasms. 30 tablet 1   Current Facility-Administered Medications  Medication Dose Route Frequency Provider Last Rate Last Admin   0.9 %  sodium chloride infusion  500 mL Intravenous Once Daryel November, MD        Allergies as of 08/21/2022   (No Known Allergies)    Family History  Problem Relation Age of Onset   Heart disease Mother    Colon cancer Mother        7   Hypertension Mother    Cancer Father        type unknown   Kidney disease Maternal Aunt    Diabetes Maternal Uncle    Heart attack Maternal Grandfather 40   Colon polyps Neg Hx    Esophageal cancer Neg Hx    Rectal cancer Neg Hx    Stomach cancer Neg Hx     Social History   Socioeconomic History   Marital status: Married    Spouse name: Not on file   Number of children: 3   Years of education: Not on file   Highest education level: Not on file  Occupational History   Occupation: retired  Tobacco Use   Smoking status: Never   Smokeless tobacco: Never  Vaping Use   Vaping Use: Never used  Substance and Sexual Activity   Alcohol use: Yes    Alcohol/week: 1.0  standard drink of alcohol    Types: 1 Standard drinks or equivalent per week    Comment: 1 drink monthly   Drug use: Never   Sexual activity: Yes  Other Topics Concern   Not on file  Social History Narrative   Not on file   Social Determinants  of Health   Financial Resource Strain: Not on file  Food Insecurity: Not on file  Transportation Needs: Not on file  Physical Activity: Not on file  Stress: Not on file  Social Connections: Not on file  Intimate Partner Violence: Not on file    Review of Systems:  All other review of systems negative except as mentioned in the HPI.  Physical Exam: Vital signs BP (!) 142/69   Pulse 75   Temp (!) 96 F (35.6 C) (Temporal)   Resp 12   Ht '5\' 10"'$  (1.778 m)   Wt 195 lb (88.5 kg)   SpO2 96%   BMI 27.98 kg/m   General:   Alert,  Well-developed, well-nourished, pleasant and cooperative in NAD Airway:  Mallampati 1 Lungs:  Clear throughout to auscultation.   Heart:  Regular rate and rhythm; no murmurs, clicks, rubs,  or gallops. Abdomen:  Soft, nontender and nondistended. Normal bowel sounds.   Neuro/Psych:  Normal mood and affect. A and O x 3   Cherrise Occhipinti E. Candis Schatz, MD [facility]

## 2022-08-21 NOTE — Patient Instructions (Addendum)
Recommendation: Patient has a contact number available for                            emergencies. The signs and symptoms of potential                            delayed complications were discussed with the                            patient. Return to normal activities tomorrow.                            Written discharge instructions were provided to the                            patient.                           - Resume previous diet.                           - Continue present medications.                           - Repeat upper endoscopy at appointment to be                            scheduled because of aborted procedure and                            inadequate visualization due to food in the stomach.                           - Recommend 24 hours of clear liquid diet for next                            EGD.  YOU HAD AN ENDOSCOPIC PROCEDURE TODAY AT [facility]:   Refer to the procedure report that was given to you for any specific questions about what was found during the examination.  If the procedure report does not answer your questions, please call your gastroenterologist to clarify.  If you requested that your care partner not be given the details of your procedure findings, then the procedure report has been included in a sealed envelope for you to review at your convenience later.  YOU SHOULD EXPECT: Some feelings of bloating in the abdomen. Passage of more gas than usual.  Walking can help get rid of the air that was put into your GI tract during the procedure and reduce the bloating. If you had a lower endoscopy (such as a colonoscopy or flexible sigmoidoscopy) you may notice spotting of blood in your stool or on the toilet paper. If you underwent a bowel prep for your procedure, you may not have a normal bowel movement for a few days.  Please Note:  You might notice some irritation and congestion in your nose or some drainage.  This is from the oxygen  used during your procedure.  There is no need for concern and it should clear up in a day or so.  SYMPTOMS TO REPORT IMMEDIATELY: Following upper endoscopy (EGD)  Vomiting of blood or coffee ground material  New chest pain or pain under the shoulder blades  Painful or persistently difficult swallowing  New shortness of breath  Fever of 100F or higher  Black, tarry-looking stools  For urgent or emergent issues, a gastroenterologist can be reached at any hour by calling [phone]. Do not use MyChart messaging for urgent concerns.    DIET:  We do recommend a small meal at first, but then you may proceed to your regular diet.  Drink plenty of fluids but you should avoid alcoholic beverages for 24 hours.  ACTIVITY:  You should plan to take it easy for the rest of today and you should NOT DRIVE or use heavy machinery until tomorrow (because of the sedation medicines used during the test).    FOLLOW UP: Our staff will call the number listed on your records the next business day following your procedure.  We will call around 7:15- 8:00 am to check on you and address any questions or concerns that you may have regarding the information given to you following your procedure. If we do not reach you, we will leave a message.     If any biopsies were taken you will be contacted by phone or by letter within the next 1-3 weeks.  Please call us at [phone] if you have not heard about the biopsies in 3 weeks.   SIGNATURES/CONFIDENTIALITY: You and/or your care partner have signed paperwork which will be entered into your electronic medical record.  These signatures attest to the fact that that the information above on your After Visit Summary has been reviewed and is understood.  Full responsibility of the confidentiality of this discharge information lies with you and/or your care-partner.

## 2022-08-21 NOTE — Progress Notes (Signed)
Report to pacu rn. Vss. Care resumed by rn. 

## 2022-08-22 ENCOUNTER — Telehealth: Payer: Self-pay | Admitting: *Deleted

## 2022-08-22 ENCOUNTER — Encounter: Payer: Self-pay | Admitting: Gastroenterology

## 2022-08-22 NOTE — Telephone Encounter (Signed)
Left message on f/u call 

## 2022-08-23 ENCOUNTER — Other Ambulatory Visit: Payer: Self-pay

## 2022-08-23 ENCOUNTER — Other Ambulatory Visit: Payer: Self-pay | Admitting: Internal Medicine

## 2022-08-23 DIAGNOSIS — D509 Iron deficiency anemia, unspecified: Secondary | ICD-10-CM

## 2022-08-23 NOTE — Telephone Encounter (Signed)
Lab orders and reminder in epic. 

## 2022-08-24 ENCOUNTER — Other Ambulatory Visit: Payer: Self-pay | Admitting: Internal Medicine

## 2022-08-24 DIAGNOSIS — E1159 Type 2 diabetes mellitus with other circulatory complications: Secondary | ICD-10-CM

## 2022-08-31 NOTE — Progress Notes (Signed)
Cardiology Office Note   Date:  09/01/2022   ID:  [name], [name] [birth year], MRN [id number]  PCP:  [name], MD    No chief complaint on file.  CAD  Wt Readings from Last 3 Encounters:  09/01/22 195 lb 3.2 oz (88.5 kg)  08/21/22 195 lb (88.5 kg)  07/20/22 195 lb (88.5 kg)       History of Present Illness: [name] Love is a 67 y.o. male  who had CABG in 2007.  He had a nuclear stress test in 2012 that was ok.   Echo in 2014: Left ventricle: The cavity size was normal. There was mild   focal basal hypertrophy of the septum. Systolic function   was normal. The estimated ejection fraction was in the   range of 55% to 60%. Wall motion was normal; there were no   regional wall motion abnormalities. - Left atrium: The atrium was mildly dilated.   He does the elliptical on a fairly regular basis.  No cardiac sx.  He has to rehab his shoulders.     Got platelet rich plasma plasma injections for his hamstrings. Hamstrings improved.  Limited by back pain.  Walking limited.    Bowls once a week.  Plays golf.   No problems with the COVID vaccines.     Found to be anemic.  EGD and colonoscopy for anemia was done; ulcer noted in the stomach- started on omeprazole.  Treated with iron.  Colonoscopy was normal.   Had back surgery in 11/23.  A back nerve ablation is scheduled. 25 lb weight loss with Ozempic.    Past Medical History:  Diagnosis Date   Anemia    Arthritis    Cancer ([place])    skin cancer on left posterior neck - excised   Cataract    Coronary artery disease    post CABG x3 in 2007   Diabetes mellitus    type 2   GERD (gastroesophageal reflux disease)    Headache    no current problem   Hearing loss    some hearing loss bilateral - no hearing aids   History of kidney stones 1990   passed stone   Hyperlipidemia    Hypertension    Obesity     Past Surgical History:  Procedure Laterality Date   CARDIAC  CATHETERIZATION  EF= 45-50%   EF= 45-50% -- Three-vessel coronary artery disease with high-grade lesions in the proximal left anterior descending artery and multiple lesions in the right coronary -- Low normal ejection fraction -- Stable exertional angina -- [name], M.D. [facility]   COLONOSCOPY  09/26/2021   CORONARY ARTERY BYPASS GRAFT  02/19/2006    x3 (left internal mammary artery to LAD, left radial artery to posterior descending, saphenous vein graft to first diagonal), endoscopic vein harvest right leg -- SURGEON:  [name]. [name], M.D.   HEMI-MICRODISCECTOMY LUMBAR LAMINECTOMY LEVEL 1 Left 06/08/2022   Procedure: Micro hemi laminectomy Lumbar Five-Sacral One left;  Surgeon: Susa Day, MD;  Location: [facility];  Service: Orthopedics;  Laterality: Left;  90 mins 3 C-Bed   HERNIA REPAIR     at 67 y/o   SHOULDER ARTHROSCOPY Right 2007   tripple bypass     UPPER GASTROINTESTINAL ENDOSCOPY  09/26/2021     Current Outpatient Medications  Medication Sig Dispense Refill   acetaminophen (TYLENOL) 500 MG tablet Take 1,000 mg by mouth every 8 (eight) hours as needed for moderate pain.  ascorbic acid (VITAMIN C) 500 MG tablet Take 500 mg by mouth daily.     atorvastatin (LIPITOR) 40 MG tablet Take 1 tablet by mouth once daily 90 tablet 2   empagliflozin (JARDIANCE) 25 MG TABS tablet Take 1 tablet (25 mg total) by mouth daily before breakfast. 90 tablet 3   ferrous sulfate 325 (65 FE) MG EC tablet Take 325 mg by mouth daily.     losartan (COZAAR) 50 MG tablet Take 1 tablet by mouth once daily 90 tablet 0   Magnesium Glycinate 665 MG CAPS Take 1 tablet by mouth in the morning and at bedtime.     metFORMIN (GLUCOPHAGE-XR) 500 MG 24 hr tablet TAKE 2 TABLETS BY MOUTH ONCE DAILY IN THE MORNING AND 2 AT BEDTIME 360 tablet 0   methocarbamol (ROBAXIN-750) 750 MG tablet Take 1 tablet (750 mg total) by mouth every 8 (eight) hours as needed for muscle spasms. 30 tablet 1   omeprazole  (PRILOSEC) 20 MG capsule TAKE 1 CAPSULE BY MOUTH TWICE DAILY BEFORE A MEAL 60 capsule 0   OZEMPIC, 1 MG/DOSE, 4 MG/3ML SOPN INJECT 1 MG INTO THE SKIN ONCE A WEEK 9 mL 0   POTASSIUM GLUCONATE PO Take 650 mg by mouth 2 (two) times daily.     No current facility-administered medications for this visit.    Allergies:   Patient has no known allergies.    Social History:  The patient  reports that he has never smoked. He has never used smokeless tobacco. He reports current alcohol use of about 1.0 standard drink of alcohol per week. He reports that he does not use drugs.   Family History:  The patient's family history includes Cancer in his father; Colon cancer in his mother; Diabetes in his maternal uncle; Heart attack (age of onset: 77) in his maternal grandfather; Heart disease in his mother; Hypertension in his mother; Kidney disease in his maternal aunt.    ROS:  Please see the history of present illness.   Otherwise, review of systems are positive for back pain.   All other systems are reviewed and negative.    PHYSICAL EXAM: VS:  BP (!) 144/62   Pulse 76   Ht 5' 10.5" (1.791 m)   Wt 195 lb 3.2 oz (88.5 kg)   SpO2 97%   BMI 27.61 kg/m  , BMI Body mass index is 27.61 kg/m. GEN: Well nourished, well developed, in no acute distress HEENT: normal Neck: no JVD, carotid bruits, or masses Cardiac: RRR; no murmurs, rubs, or gallops,no edema  Respiratory:  clear to auscultation bilaterally, normal work of breathing GI: soft, nontender, nondistended, + BS MS: no deformity or atrophy Skin: warm and dry, no rash Neuro:  Strength and sensation are intact Psych: euthymic mood, full affect   EKG:   The ekg ordered today demonstrates NSR, nonspecific ST changes; no change since 2023 ECG   Recent Labs: 09/19/2021: TSH 1.67 07/06/2022: ALT 15; BUN 10; Creatinine, Ser 1.00; Hemoglobin 12.6; Platelets 208.0; Potassium 3.7; Sodium 143   Lipid Panel    Component Value Date/Time   CHOL 77  07/06/2022 1535   CHOL 91 (L) 06/24/2020 0930   TRIG 314.0 (H) 07/06/2022 1535   HDL 30.50 (L) 07/06/2022 1535   HDL 29 (L) 06/24/2020 0930   CHOLHDL 3 07/06/2022 1535   VLDL 62.8 (H) 07/06/2022 1535   LDLCALC 25 07/04/2021 1154   LDLCALC 27 06/24/2020 0930   LDLDIRECT 21.0 07/06/2022 1535     Other  studies Reviewed: Additional studies/ records that were reviewed today with results demonstrating: .   ASSESSMENT AND PLAN:  CAD: s/p CABG.  No angina.  Continue aggressive secondary prevention.  Hyperlipidemia: 2023 triglycerides 314 LDL 21 HDL 30 total cholesterol 77 but these were nonfasting labs Abnormal ECG: Hypertension: mildly increased today.  Normal at endoscopy and in Dec 2023 with Dr. Mitchel Honour.   Diabetes: Hemoglobin A1c 6.7 in November 2023.  Whole food,Plant-based diet.  High-fiber diet. avoid processed foods    Current medicines are reviewed at length with the patient today.  The patient concerns regarding his medicines were addressed.  The following changes have been made:  No change  Labs/ tests ordered today include:  No orders of the defined types were placed in this encounter.   Recommend 150 minutes/week of aerobic exercise Low fat, low carb, high fiber diet recommended  Disposition:   FU in 1 year   Signed, Larae Grooms, MD  09/01/2022 11:40 AM    [facility] [address], [place], [place]  [address] Phone: [phone]; Fax: [phone]

## 2022-09-01 ENCOUNTER — Ambulatory Visit: Payer: Medicare PPO | Attending: Interventional Cardiology | Admitting: Interventional Cardiology

## 2022-09-01 ENCOUNTER — Encounter: Payer: Self-pay | Admitting: Interventional Cardiology

## 2022-09-01 VITALS — BP 128/56 | HR 76 | Ht 70.5 in | Wt 195.2 lb

## 2022-09-01 DIAGNOSIS — I25118 Atherosclerotic heart disease of native coronary artery with other forms of angina pectoris: Secondary | ICD-10-CM | POA: Diagnosis not present

## 2022-09-01 DIAGNOSIS — E782 Mixed hyperlipidemia: Secondary | ICD-10-CM | POA: Diagnosis not present

## 2022-09-01 DIAGNOSIS — R9431 Abnormal electrocardiogram [ECG] [EKG]: Secondary | ICD-10-CM

## 2022-09-01 DIAGNOSIS — I10 Essential (primary) hypertension: Secondary | ICD-10-CM

## 2022-09-01 DIAGNOSIS — E1165 Type 2 diabetes mellitus with hyperglycemia: Secondary | ICD-10-CM

## 2022-09-01 NOTE — Patient Instructions (Signed)
Medication Instructions:  Your physician recommends that you continue on your current medications as directed. Please refer to the Current Medication list given to you today.  *If you need a refill on your cardiac medications before your next appointment, please call your pharmacy*   Lab Work: none If you have labs (blood work) drawn today and your tests are completely normal, you will receive your results only by: [place] (if you have MyChart) OR A paper copy in the mail If you have any lab test that is abnormal or we need to change your treatment, we will call you to review the results.   Testing/Procedures: none   Follow-Up: At [facility], you and your health needs are our priority.  As part of our continuing mission to provide you with exceptional heart care, we have created designated Provider Care Teams.  These Care Teams include your primary Cardiologist (physician) and Advanced Practice Providers (APPs -  Physician Assistants and Nurse Practitioners) who all work together to provide you with the care you need, when you need it.  We recommend signing up for the patient portal called "MyChart".  Sign up information is provided on this After Visit Summary.  MyChart is used to connect with patients for Virtual Visits (Telemedicine).  Patients are able to view lab/test results, encounter notes, upcoming appointments, etc.  Non-urgent messages can be sent to your provider as well.   To learn more about what you can do with MyChart, go to [email or web address].    Your next appointment:   12 month(s)  Provider:   Larae Grooms, MD     Other Instructions  High-Fiber Eating Plan Fiber, also called dietary fiber, is a type of carbohydrate. It is found foods such as fruits, vegetables, whole grains, and beans. A high-fiber diet can have many health benefits. Your health care provider may recommend a high-fiber diet to help: Prevent constipation. Fiber can make  your bowel movements more regular. Lower your cholesterol. Relieve the following conditions: Inflammation of veins in the anus (hemorrhoids). Inflammation of specific areas of the digestive tract (uncomplicated diverticulosis). A problem of the large intestine, also called the colon, that sometimes causes pain and diarrhea (irritable bowel syndrome, or IBS). Prevent overeating as part of a weight-loss plan. Prevent heart disease, type 2 diabetes, and certain cancers. What are tips for following this plan? Reading food labels  Check the nutrition facts label on food products for the amount of dietary fiber. Choose foods that have 5 grams of fiber or more per serving. The goals for recommended daily fiber intake include: Men (age 72 or younger): 34-38 g. Men (over age 45): 28-34 g. Women (age 59 or younger): 25-28 g. Women (over age 89): 22-25 g. Your daily fiber goal is _____________ g. Shopping Choose whole fruits and vegetables instead of processed forms, such as apple juice or applesauce. Choose a wide variety of high-fiber foods such as avocados, lentils, oats, and kidney beans. Read the nutrition facts label of the foods you choose. Be aware of foods with added fiber. These foods often have high sugar and sodium amounts per serving. Cooking Use whole-grain flour for baking and cooking. Cook with brown rice instead of white rice. Meal planning Start the day with a breakfast that is high in fiber, such as a cereal that contains 5 g of fiber or more per serving. Eat breads and cereals that are made with whole-grain flour instead of refined flour or white flour. Eat brown rice, bulgur wheat,  or millet instead of white rice. Use beans in place of meat in soups, salads, and pasta dishes. Be sure that half of the grains you eat each day are whole grains. General information You can get the recommended daily intake of dietary fiber by: Eating a variety of fruits, vegetables, grains,  nuts, and beans. Taking a fiber supplement if you are not able to take in enough fiber in your diet. It is better to get fiber through food than from a supplement. Gradually increase how much fiber you consume. If you increase your intake of dietary fiber too quickly, you may have bloating, cramping, or gas. Drink plenty of water to help you digest fiber. Choose high-fiber snacks, such as berries, raw vegetables, nuts, and popcorn. What foods should I eat? Fruits Berries. Pears. Apples. Oranges. Avocado. Prunes and raisins. Dried figs. Vegetables Sweet potatoes. Spinach. Kale. Artichokes. Cabbage. Broccoli. Cauliflower. Green peas. Carrots. Squash. Grains Whole-grain breads. Multigrain cereal. Oats and oatmeal. Brown rice. Barley. Bulgur wheat. [place]. Quinoa. Bran muffins. Popcorn. Rye wafer crackers. Meats and other proteins Navy beans, kidney beans, and pinto beans. Soybeans. Split peas. Lentils. Nuts and seeds. Dairy Fiber-fortified yogurt. Beverages Fiber-fortified soy milk. Fiber-fortified orange juice. Other foods Fiber bars. The items listed above may not be a complete list of recommended foods and beverages. Contact a dietitian for more information. What foods should I avoid? Fruits Fruit juice. Cooked, strained fruit. Vegetables Fried potatoes. Canned vegetables. Well-cooked vegetables. Grains White bread. Pasta made with refined flour. White rice. Meats and other proteins Fatty cuts of meat. Fried chicken or fried fish. Dairy Milk. Yogurt. Cream cheese. Sour cream. Fats and oils Butters. Beverages Soft drinks. Other foods Cakes and pastries. The items listed above may not be a complete list of foods and beverages to avoid. Talk with your dietitian about what choices are best for you. Summary Fiber is a type of carbohydrate. It is found in foods such as fruits, vegetables, whole grains, and beans. A high-fiber diet has many benefits. It can help to prevent  constipation, lower blood cholesterol, aid weight loss, and reduce your risk of heart disease, diabetes, and certain cancers. Increase your intake of fiber gradually. Increasing fiber too quickly may cause cramping, bloating, and gas. Drink plenty of water while you increase the amount of fiber you consume. The best sources of fiber include whole fruits and vegetables, whole grains, nuts, seeds, and beans. This information is not intended to replace advice given to you by your health care provider. Make sure you discuss any questions you have with your health care provider. Document Revised: 11/13/2019 Document Reviewed: 11/13/2019 Elsevier Patient Education  [place].

## 2022-09-04 NOTE — Addendum Note (Signed)
Addended by: Janan Halter F on: 09/04/2022 07:44 PM   Modules accepted: Orders

## 2022-09-06 ENCOUNTER — Other Ambulatory Visit: Payer: Self-pay | Admitting: Gastroenterology

## 2022-09-10 ENCOUNTER — Encounter: Payer: Self-pay | Admitting: Internal Medicine

## 2022-09-12 ENCOUNTER — Encounter: Payer: Self-pay | Admitting: Interventional Cardiology

## 2022-09-22 ENCOUNTER — Other Ambulatory Visit (INDEPENDENT_AMBULATORY_CARE_PROVIDER_SITE_OTHER): Payer: Medicare PPO

## 2022-09-22 ENCOUNTER — Other Ambulatory Visit: Payer: Self-pay | Admitting: Emergency Medicine

## 2022-09-22 DIAGNOSIS — D509 Iron deficiency anemia, unspecified: Secondary | ICD-10-CM

## 2022-09-22 DIAGNOSIS — I10 Essential (primary) hypertension: Secondary | ICD-10-CM

## 2022-09-22 LAB — CBC WITH DIFFERENTIAL/PLATELET
Basophils Absolute: 0 10*3/uL (ref 0.0–0.1)
Basophils Relative: 0.7 % (ref 0.0–3.0)
Eosinophils Absolute: 0.1 10*3/uL (ref 0.0–0.7)
Eosinophils Relative: 1.9 % (ref 0.0–5.0)
HCT: 44 % (ref 39.0–52.0)
Hemoglobin: 14.6 g/dL (ref 13.0–17.0)
Lymphocytes Relative: 18.5 % (ref 12.0–46.0)
Lymphs Abs: 0.9 10*3/uL (ref 0.7–4.0)
MCHC: 33.2 g/dL (ref 30.0–36.0)
MCV: 83.5 fl (ref 78.0–100.0)
Monocytes Absolute: 0.4 10*3/uL (ref 0.1–1.0)
Monocytes Relative: 8.3 % (ref 3.0–12.0)
Neutro Abs: 3.6 10*3/uL (ref 1.4–7.7)
Neutrophils Relative %: 70.6 % (ref 43.0–77.0)
Platelets: 172 10*3/uL (ref 150.0–400.0)
RBC: 5.27 Mil/uL (ref 4.22–5.81)
RDW: 14.7 % (ref 11.5–15.5)
WBC: 5.1 10*3/uL (ref 4.0–10.5)

## 2022-09-22 LAB — IBC + FERRITIN
Ferritin: 15.5 ng/mL — ABNORMAL LOW (ref 22.0–322.0)
Iron: 49 ug/dL (ref 42–165)
Saturation Ratios: 9 % — ABNORMAL LOW (ref 20.0–50.0)
TIBC: 541.8 ug/dL — ABNORMAL HIGH (ref 250.0–450.0)
Transferrin: 387 mg/dL — ABNORMAL HIGH (ref 212.0–360.0)

## 2022-09-25 DIAGNOSIS — E1159 Type 2 diabetes mellitus with other circulatory complications: Secondary | ICD-10-CM | POA: Diagnosis not present

## 2022-09-26 DIAGNOSIS — M170 Bilateral primary osteoarthritis of knee: Secondary | ICD-10-CM | POA: Diagnosis not present

## 2022-09-26 DIAGNOSIS — M25561 Pain in right knee: Secondary | ICD-10-CM | POA: Diagnosis not present

## 2022-09-26 DIAGNOSIS — M25562 Pain in left knee: Secondary | ICD-10-CM | POA: Diagnosis not present

## 2022-09-27 ENCOUNTER — Other Ambulatory Visit: Payer: Self-pay

## 2022-09-27 DIAGNOSIS — D509 Iron deficiency anemia, unspecified: Secondary | ICD-10-CM

## 2022-09-27 NOTE — Progress Notes (Signed)
[name] Love,  Your hemoglobin was normal and improved from 2 months ago.  Your iron levels continue to be low.  Are you still taking the oral iron supplement?

## 2022-09-29 DIAGNOSIS — M47816 Spondylosis without myelopathy or radiculopathy, lumbar region: Secondary | ICD-10-CM | POA: Diagnosis not present

## 2022-09-29 DIAGNOSIS — M47896 Other spondylosis, lumbar region: Secondary | ICD-10-CM | POA: Diagnosis not present

## 2022-10-10 ENCOUNTER — Encounter: Payer: Medicare PPO | Admitting: Gastroenterology

## 2022-10-19 ENCOUNTER — Ambulatory Visit: Payer: Medicare PPO | Admitting: Internal Medicine

## 2022-10-19 ENCOUNTER — Encounter: Payer: Self-pay | Admitting: Internal Medicine

## 2022-10-19 VITALS — BP 140/70 | HR 72 | Ht 70.0 in | Wt 202.0 lb

## 2022-10-19 DIAGNOSIS — Z6832 Body mass index (BMI) 32.0-32.9, adult: Secondary | ICD-10-CM | POA: Diagnosis not present

## 2022-10-19 DIAGNOSIS — E1159 Type 2 diabetes mellitus with other circulatory complications: Secondary | ICD-10-CM

## 2022-10-19 DIAGNOSIS — E669 Obesity, unspecified: Secondary | ICD-10-CM

## 2022-10-19 DIAGNOSIS — E785 Hyperlipidemia, unspecified: Secondary | ICD-10-CM | POA: Diagnosis not present

## 2022-10-19 DIAGNOSIS — E1165 Type 2 diabetes mellitus with hyperglycemia: Secondary | ICD-10-CM

## 2022-10-19 LAB — POCT GLYCOSYLATED HEMOGLOBIN (HGB A1C): Hemoglobin A1C: 6.8 % — AB (ref 4.0–5.6)

## 2022-10-19 MED ORDER — OZEMPIC (2 MG/DOSE) 8 MG/3ML ~~LOC~~ SOPN: 2.0000 mg | PEN_INJECTOR | SUBCUTANEOUS | 3 refills | Status: DC

## 2022-10-19 NOTE — Progress Notes (Signed)
Patient ID: [name] Love, male   DOB: January 17, 1956, 67 y.o.   MRN: [id number]   HPI: [name] Love is a 67 y.o.-year-old male, initially referred by his PCP, Dr. Mitchel Honour, returning for follow-up for DM2, dx in ~2008, non-insulin-dependent, uncontrolled, with long term complications (CAD - s/p CABG in 2007, CKD, PN, DR).  Last visit 4 months ago.  Interim history: No increased urination, blurry vision, nausea, chest pain.  He previously had steroid injections for back pain, then, before last visit, he had 06/08/2022 spinal stenosis surgery. He was off his diabetic meds for the surgery and was less mobile so sugars were higher.   He had to have a nerve ablation >> did not work very well.  Reviewing HbA1c levels: Lab Results  Component Value Date   HGBA1C 6.8 Repeated and verified X2. (H) 07/06/2022   HGBA1C 6.7 (A) 06/19/2022   HGBA1C 6.6 (H) 06/01/2022   HGBA1C 6.8 (A) 02/06/2022   HGBA1C 7.1 (A) 09/19/2021   HGBA1C 7.1 (A) 05/16/2021   HGBA1C 7.3 (A) 01/03/2021   HGBA1C 6.4 (A) 09/14/2020   HGBA1C 6.9 (A) 05/12/2020   HGBA1C 6.9 (A) 12/02/2019   HGBA1C 6.9 (A) 08/04/2019   HGBA1C 7.3 (H) 06/10/2019   HGBA1C 6.8 (A) 04/02/2019   HGBA1C 7.0 (A) 11/05/2018   HGBA1C 6.5 (A) 07/10/2018  He got a steroid inj in shoulder >> 05/10/2017.  Pt is on a regimen of: - Metformin 1000 mg 2x a day, with meals >> ER formulation -improved diarrhea - Farxiga 10 mg >> Jardiance 25 before b'fast - Trulicity 1.5 mg weekly-started 05/2017 >> 3 mg weekly >> Ozempic 1 mg weekly We stopped glipizide in 11/2019.  He checks his sugars more than 4 times a day with his freestyle libre CGM - [facility]:  Previously:  Previously:   Lowest sugar was 66 (Libre) >> ... 80 >> 90s; it is unclear at which level he has hypoglycemia awareness. Highest sugar was 298 >> 200s >> 200s.  Pt's meals are: - Breakfast: bagel, OJ >> cereals >> occas. Cereals, or eggs  >> stopped OJ  - Lunch: soup  or sandwich or leftovers - Dinner: meat, veggies - Snacks: not daily  -+ Mild CKD, last BUN/creatinine:  Lab Results  Component Value Date   BUN 10 07/06/2022   BUN 9 06/01/2022   CREATININE 1.00 07/06/2022   CREATININE 0.91 06/01/2022  On losartan 50.  -+ HL; last set of lipids: Lab Results  Component Value Date   CHOL 77 07/06/2022   HDL 30.50 (L) 07/06/2022   LDLCALC 25 07/04/2021   LDLDIRECT 21.0 07/06/2022   TRIG 314.0 (H) 07/06/2022   CHOLHDL 3 07/06/2022  On Lipitor, omega-3 fatty acids 3000 mg 2x a day.  - last eye exam was 01/31/2022: + DR.  Dr. Alois Cliche.   -+ Numbness and tingling in his feet.  Last foot exam 09/19/2021.  On ASA 81.  He has a history of a slightly elevated TSH, which normalized at last visit: Lab Results  Component Value Date   TSH 1.67 09/19/2021   TSH 5.170 (H) 06/24/2020   TSH 2.918 05/22/2013   TSH 1.843 09/12/2012   ROS: + See HPI  I reviewed pt's medications, allergies, PMH, social hx, family hx, and changes were documented in the history of present illness. Otherwise, unchanged from my initial visit note.  Past Medical History:  Diagnosis Date   Anemia    Arthritis    Cancer ([place])    skin cancer  on left posterior neck - excised   Cataract    Coronary artery disease    post CABG x3 in 2007   Diabetes mellitus    type 2   GERD (gastroesophageal reflux disease)    Headache    no current problem   Hearing loss    some hearing loss bilateral - no hearing aids   History of kidney stones 1990   passed stone   Hyperlipidemia    Hypertension    Obesity    Past Surgical History:  Procedure Laterality Date   CARDIAC CATHETERIZATION  EF= 45-50%   EF= 45-50% -- Three-vessel coronary artery disease with high-grade lesions in the proximal left anterior descending artery and multiple lesions in the right coronary -- Low normal ejection fraction -- Stable exertional angina -- [name], M.D. [facility]   COLONOSCOPY  09/26/2021    CORONARY ARTERY BYPASS GRAFT  02/19/2006    x3 (left internal mammary artery to LAD, left radial artery to posterior descending, saphenous vein graft to first diagonal), endoscopic vein harvest right leg -- SURGEON:  [name]. [name], M.D.   HEMI-MICRODISCECTOMY LUMBAR LAMINECTOMY LEVEL 1 Left 06/08/2022   Procedure: Micro hemi laminectomy Lumbar Five-Sacral One left;  Surgeon: Susa Day, MD;  Location: [facility];  Service: Orthopedics;  Laterality: Left;  90 mins 3 C-Bed   HERNIA REPAIR     at 67 y/o   SHOULDER ARTHROSCOPY Right 2007   tripple bypass     UPPER GASTROINTESTINAL ENDOSCOPY  09/26/2021   Social History   Socioeconomic History   Marital status: Married    Spouse name: Not on file   Number of children: 3  Social Needs  Occupational History   unemployed  Tobacco Use   Smoking status: Never Smoker   Smokeless tobacco: Never Used  Substance and Sexual Activity   Alcohol use: Yes    Alcohol/week:     Types: 1-2 per mo   Drug use: No      Other Topics      Social History Narrative      Current Outpatient Medications on File Prior to Visit  Medication Sig Dispense Refill   acetaminophen (TYLENOL) 500 MG tablet Take 1,000 mg by mouth every 8 (eight) hours as needed for moderate pain.     ascorbic acid (VITAMIN C) 500 MG tablet Take 500 mg by mouth daily.     atorvastatin (LIPITOR) 40 MG tablet Take 1 tablet by mouth once daily 90 tablet 2   empagliflozin (JARDIANCE) 25 MG TABS tablet Take 1 tablet (25 mg total) by mouth daily before breakfast. 90 tablet 3   ferrous sulfate 325 (65 FE) MG EC tablet Take 325 mg by mouth daily.     losartan (COZAAR) 50 MG tablet Take 1 tablet by mouth once daily 90 tablet 0   Magnesium Glycinate 665 MG CAPS Take 1 tablet by mouth in the morning and at bedtime.     metFORMIN (GLUCOPHAGE-XR) 500 MG 24 hr tablet TAKE 2 TABLETS BY MOUTH ONCE DAILY IN THE MORNING AND 2 AT BEDTIME 360 tablet 0   methocarbamol (ROBAXIN-750) 750 MG tablet  Take 1 tablet (750 mg total) by mouth every 8 (eight) hours as needed for muscle spasms. 30 tablet 1   omeprazole (PRILOSEC) 20 MG capsule TAKE 1 CAPSULE BY MOUTH TWICE DAILY BEFORE A MEAL 60 capsule 2   OZEMPIC, 1 MG/DOSE, 4 MG/3ML SOPN INJECT 1 MG INTO THE SKIN ONCE A WEEK 9 mL 0  POTASSIUM GLUCONATE PO Take 650 mg by mouth 2 (two) times daily.     No current facility-administered medications on file prior to visit.   No Known Allergies Family History  Problem Relation Age of Onset   Heart disease Mother    Colon cancer Mother        46   Hypertension Mother    Cancer Father        type unknown   Kidney disease Maternal Aunt    Diabetes Maternal Uncle    Heart attack Maternal Grandfather 72   Colon polyps Neg Hx    Esophageal cancer Neg Hx    Rectal cancer Neg Hx    Stomach cancer Neg Hx    PE: BP (!) 140/70   Pulse 72   Ht 5\' 10"  (1.778 m)   Wt 202 lb (91.6 kg)   SpO2 96%   BMI 28.98 kg/m  Wt Readings from Last 3 Encounters:  10/19/22 202 lb (91.6 kg)  09/01/22 195 lb 3.2 oz (88.5 kg)  08/21/22 195 lb (88.5 kg)   Constitutional: overweight, in NAD Eyes: EOMI, no exophthalmos ENT: no thyromegaly, no cervical lymphadenopathy Cardiovascular: RRR, No MRG Respiratory: CTA B Musculoskeletal: no deformities Skin: no rashes Neurological: no tremor with outstretched hands Diabetic Foot Exam - Simple   Simple Foot Form Diabetic Foot exam was performed with the following findings: Yes 10/19/2022  2:38 PM  Visual Inspection No deformities, no ulcerations, no other skin breakdown bilaterally: Yes Sensation Testing See comments: Yes Pulse Check See comments: Yes Comments Slightly decreased pedal pulses. Patches of decreased sensation B feet. Onychodystrophy all 10 toenails.    ASSESSMENT: 1. DM2, non-insulin-dependent, uncontrolled, without complications - CAD - s/p CABG in 2007 - CKD - PN - DR  2. HL  3. Obesity class I  PLAN:  1. Patient with  longstanding, uncontrolled, type 2 diabetes, on metformin ER, SGLT2 inhibitor and GLP-1 receptor agonist, with fair control.  At last visit, HbA1c was slightly higher, at 6.7% but he had another HbA1c obtained 06/2022 and this was  higher, at 6.8%.  Sugars are better controlled in the past when he stopped eating cereals for breakfast and we again discussed about stopping this at last visit but we did not change the regimen especially as he was more sedentary after his surgery and planning to increase activity. CGM interpretation: -At today's visit, we reviewed his CGM downloads: It appears that 70% of values are in target range (goal >70%), while 30% are higher than 180 (goal <25%), and 0% are lower than 70 (goal <4%).  The calculated average blood sugar is 167.  The projected HbA1c for the next 3 months ([facility]MI) is 7.3%. -Reviewing the CGM trends, sugars are not running overnight, but they increase even before eating in the morning and then increase more significantly after lunch and less significantly after dinner.  No lows.  At today's visit, we discussed about possibly adding liquid diet before breakfast, but another option would be to increase Ozempic.  Will try to increase Ozempic for now.  I advised him to let me know if he has any intolerance to the higher dose or has problems obtaining this, in which case, we may need to use a low-dose of glipizide ER when he wakes up.  Will continue the rest of the regimen for now. - I suggested to:  Patient Instructions  Please continue:  - Metformin ER 1000 mg 2x a day, with meals - Jardiance 25 mg before  b'fast  Please increase: - Ozempic 2 mg weekly   Please return in 4 months.  - we checked his HbA1c: 6.8% (stable) - advised to check sugars at different times of the day - 4x a day, rotating check times - advised for yearly eye exams >> he is UTD - he has significant onychodystrophy and decreased pulses in his legs.  He also has some trophic changes in  his feet.  I recommended to see podiatry. - return to clinic in 4 months  2.  Hyperlipidemia -Reviewed latest lipid panel from 06/2022: HDL low, triglycerides elevated, LDL at goal: Lab Results  Component Value Date   CHOL 77 07/06/2022   HDL 30.50 (L) 07/06/2022   LDLCALC 25 07/04/2021   LDLDIRECT 21.0 07/06/2022   TRIG 314.0 (H) 07/06/2022   CHOLHDL 3 07/06/2022  -He continues on Lipitor 40 mg daily, omega-3 fatty acids  3.  Obesity class I -continue SGLT 2 inhibitor and GLP-1 receptor agonist which should also help with weight loss -He lost 3 pounds before last visit and 13 pounds before the previous 2 visits - gained 7 lbs since then -Will try to increase Ozempic at today's visit  Philemon Kingdom, MD PhD [facility]

## 2022-10-19 NOTE — Patient Instructions (Addendum)
Please continue:  - Metformin ER 1000 mg 2x a day, with meals - Jardiance 25 mg before b'fast  Please increase: - Ozempic 2 mg weekly   Please return in 4 months.

## 2022-10-19 NOTE — Addendum Note (Signed)
Addended by: Elta Guadeloupe on: 10/19/2022 03:00 PM   Modules accepted: Orders

## 2022-10-20 DIAGNOSIS — M545 Low back pain, unspecified: Secondary | ICD-10-CM | POA: Diagnosis not present

## 2022-10-23 DIAGNOSIS — M47816 Spondylosis without myelopathy or radiculopathy, lumbar region: Secondary | ICD-10-CM | POA: Diagnosis not present

## 2022-10-23 DIAGNOSIS — M4316 Spondylolisthesis, lumbar region: Secondary | ICD-10-CM | POA: Diagnosis not present

## 2022-10-26 DIAGNOSIS — M170 Bilateral primary osteoarthritis of knee: Secondary | ICD-10-CM | POA: Diagnosis not present

## 2022-11-02 DIAGNOSIS — M170 Bilateral primary osteoarthritis of knee: Secondary | ICD-10-CM | POA: Diagnosis not present

## 2022-11-09 DIAGNOSIS — M170 Bilateral primary osteoarthritis of knee: Secondary | ICD-10-CM | POA: Diagnosis not present

## 2022-11-24 ENCOUNTER — Encounter: Payer: Self-pay | Admitting: Internal Medicine

## 2022-11-24 ENCOUNTER — Other Ambulatory Visit: Payer: Self-pay | Admitting: Internal Medicine

## 2022-11-24 MED ORDER — METFORMIN HCL 1000 MG PO TABS: 1000.0000 mg | ORAL_TABLET | Freq: Two times a day (BID) | ORAL | 3 refills | Status: DC

## 2022-11-27 ENCOUNTER — Other Ambulatory Visit (INDEPENDENT_AMBULATORY_CARE_PROVIDER_SITE_OTHER): Payer: Medicare PPO

## 2022-11-27 DIAGNOSIS — D509 Iron deficiency anemia, unspecified: Secondary | ICD-10-CM | POA: Diagnosis not present

## 2022-11-27 LAB — CBC WITH DIFFERENTIAL/PLATELET
Basophils Absolute: 0 10*3/uL (ref 0.0–0.1)
Basophils Relative: 0.6 % (ref 0.0–3.0)
Eosinophils Absolute: 0.1 10*3/uL (ref 0.0–0.7)
Eosinophils Relative: 2.2 % (ref 0.0–5.0)
HCT: 44.5 % (ref 39.0–52.0)
Hemoglobin: 14.9 g/dL (ref 13.0–17.0)
Lymphocytes Relative: 23.3 % (ref 12.0–46.0)
Lymphs Abs: 1.1 10*3/uL (ref 0.7–4.0)
MCHC: 33.4 g/dL (ref 30.0–36.0)
MCV: 81.9 fl (ref 78.0–100.0)
Monocytes Absolute: 0.4 10*3/uL (ref 0.1–1.0)
Monocytes Relative: 7.6 % (ref 3.0–12.0)
Neutro Abs: 3.2 10*3/uL (ref 1.4–7.7)
Neutrophils Relative %: 66.3 % (ref 43.0–77.0)
Platelets: 173 10*3/uL (ref 150.0–400.0)
RBC: 5.43 Mil/uL (ref 4.22–5.81)
RDW: 15.3 % (ref 11.5–15.5)
WBC: 4.9 10*3/uL (ref 4.0–10.5)

## 2022-11-27 LAB — IBC + FERRITIN
Ferritin: 38.2 ng/mL (ref 22.0–322.0)
Iron: 67 ug/dL (ref 42–165)
Saturation Ratios: 14.6 % — ABNORMAL LOW (ref 20.0–50.0)
TIBC: 459.2 ug/dL — ABNORMAL HIGH (ref 250.0–450.0)
Transferrin: 328 mg/dL (ref 212.0–360.0)

## 2022-11-29 ENCOUNTER — Other Ambulatory Visit: Payer: Self-pay

## 2022-11-29 ENCOUNTER — Encounter: Payer: Self-pay | Admitting: Gastroenterology

## 2022-11-29 DIAGNOSIS — D509 Iron deficiency anemia, unspecified: Secondary | ICD-10-CM

## 2022-11-29 NOTE — Progress Notes (Signed)
[name] Love,  Your hemoglobin continues to be normal and your iron levels showed improvement but still show mild deficiency.  I think I would continue to take the iron for another 3 months, and I anticipate your iron stores would be replenished by that time.    Let's recheck CBC and iron panel in 3 months and then will plan to stop iron if iron stores have normalized.

## 2022-11-30 DIAGNOSIS — L820 Inflamed seborrheic keratosis: Secondary | ICD-10-CM | POA: Diagnosis not present

## 2022-11-30 DIAGNOSIS — L298 Other pruritus: Secondary | ICD-10-CM | POA: Diagnosis not present

## 2022-11-30 DIAGNOSIS — D485 Neoplasm of uncertain behavior of skin: Secondary | ICD-10-CM | POA: Diagnosis not present

## 2022-11-30 DIAGNOSIS — Z789 Other specified health status: Secondary | ICD-10-CM | POA: Diagnosis not present

## 2022-11-30 DIAGNOSIS — L821 Other seborrheic keratosis: Secondary | ICD-10-CM | POA: Diagnosis not present

## 2022-11-30 DIAGNOSIS — C44712 Basal cell carcinoma of skin of right lower limb, including hip: Secondary | ICD-10-CM | POA: Diagnosis not present

## 2022-11-30 DIAGNOSIS — L538 Other specified erythematous conditions: Secondary | ICD-10-CM | POA: Diagnosis not present

## 2022-11-30 DIAGNOSIS — L814 Other melanin hyperpigmentation: Secondary | ICD-10-CM | POA: Diagnosis not present

## 2022-12-12 DIAGNOSIS — L298 Other pruritus: Secondary | ICD-10-CM | POA: Diagnosis not present

## 2022-12-12 DIAGNOSIS — C44712 Basal cell carcinoma of skin of right lower limb, including hip: Secondary | ICD-10-CM | POA: Diagnosis not present

## 2022-12-12 DIAGNOSIS — L538 Other specified erythematous conditions: Secondary | ICD-10-CM | POA: Diagnosis not present

## 2022-12-12 DIAGNOSIS — Z789 Other specified health status: Secondary | ICD-10-CM | POA: Diagnosis not present

## 2022-12-12 DIAGNOSIS — L820 Inflamed seborrheic keratosis: Secondary | ICD-10-CM | POA: Diagnosis not present

## 2022-12-18 ENCOUNTER — Other Ambulatory Visit: Payer: Self-pay | Admitting: Emergency Medicine

## 2022-12-18 DIAGNOSIS — I10 Essential (primary) hypertension: Secondary | ICD-10-CM

## 2022-12-28 ENCOUNTER — Other Ambulatory Visit: Payer: Self-pay | Admitting: *Deleted

## 2023-01-04 ENCOUNTER — Telehealth: Payer: Self-pay

## 2023-01-04 ENCOUNTER — Ambulatory Visit: Payer: Medicare PPO

## 2023-01-04 NOTE — Telephone Encounter (Signed)
Unsuccessful attempt to reach patient on preferred number listed in notes for scheduled AWV. Left message on voicemail okay to reschedule. 

## 2023-01-05 ENCOUNTER — Encounter: Payer: Self-pay | Admitting: Internal Medicine

## 2023-01-09 ENCOUNTER — Other Ambulatory Visit: Payer: Self-pay

## 2023-01-17 ENCOUNTER — Encounter: Payer: Self-pay | Admitting: Endocrinology

## 2023-01-26 DIAGNOSIS — E1159 Type 2 diabetes mellitus with other circulatory complications: Secondary | ICD-10-CM | POA: Diagnosis not present

## 2023-01-28 ENCOUNTER — Other Ambulatory Visit: Payer: Self-pay | Admitting: Gastroenterology

## 2023-01-29 NOTE — Telephone Encounter (Signed)
Please advise 

## 2023-02-02 DIAGNOSIS — E113293 Type 2 diabetes mellitus with mild nonproliferative diabetic retinopathy without macular edema, bilateral: Secondary | ICD-10-CM | POA: Diagnosis not present

## 2023-02-05 ENCOUNTER — Other Ambulatory Visit: Payer: Self-pay

## 2023-02-05 MED ORDER — OMEPRAZOLE 20 MG PO CPDR: 20.0000 mg | DELAYED_RELEASE_CAPSULE | Freq: Every day | ORAL | 2 refills | Status: DC

## 2023-02-09 NOTE — Progress Notes (Signed)
Order(s) created erroneously. Erroneous order ID: [id number]  Order moved by: Ian Malkin  Order move date/time: 02/09/2023 10:46 AM  Source Patient: [id number]  Source Contact: 01/17/2023  Destination Patient: [id number]  Destination Contact: 10/08/2012

## 2023-02-13 ENCOUNTER — Other Ambulatory Visit: Payer: Self-pay

## 2023-02-13 MED ORDER — OMEPRAZOLE 20 MG PO CPDR: 20.0000 mg | DELAYED_RELEASE_CAPSULE | Freq: Every day | ORAL | 2 refills | Status: DC

## 2023-02-13 NOTE — Telephone Encounter (Signed)
Changed Omeprazole 20 mg to once daily #90, 1 RF

## 2023-02-26 ENCOUNTER — Encounter: Payer: Self-pay | Admitting: Emergency Medicine

## 2023-02-26 ENCOUNTER — Other Ambulatory Visit: Payer: Self-pay | Admitting: Emergency Medicine

## 2023-02-26 DIAGNOSIS — U071 COVID-19: Secondary | ICD-10-CM

## 2023-02-26 MED ORDER — NIRMATRELVIR/RITONAVIR (PAXLOVID)TABLET
3.0000 | ORAL_TABLET | Freq: Two times a day (BID) | ORAL | 0 refills | Status: AC
Start: 2023-02-26 — End: 2023-03-03

## 2023-02-26 NOTE — Telephone Encounter (Signed)
Pt tested positive for covid yesterday and is requesting paxlovid

## 2023-03-01 ENCOUNTER — Other Ambulatory Visit: Payer: Self-pay

## 2023-03-04 ENCOUNTER — Other Ambulatory Visit: Payer: Self-pay | Admitting: Internal Medicine

## 2023-03-04 ENCOUNTER — Other Ambulatory Visit: Payer: Self-pay | Admitting: Emergency Medicine

## 2023-03-04 DIAGNOSIS — I10 Essential (primary) hypertension: Secondary | ICD-10-CM

## 2023-03-09 ENCOUNTER — Encounter: Payer: Self-pay | Admitting: Emergency Medicine

## 2023-03-09 ENCOUNTER — Other Ambulatory Visit (INDEPENDENT_AMBULATORY_CARE_PROVIDER_SITE_OTHER): Payer: Medicare PPO

## 2023-03-09 ENCOUNTER — Encounter: Payer: Self-pay | Admitting: Internal Medicine

## 2023-03-09 ENCOUNTER — Ambulatory Visit: Payer: Medicare PPO | Admitting: Internal Medicine

## 2023-03-09 VITALS — BP 126/70 | HR 79 | Ht 70.0 in | Wt 193.0 lb

## 2023-03-09 DIAGNOSIS — D509 Iron deficiency anemia, unspecified: Secondary | ICD-10-CM | POA: Diagnosis not present

## 2023-03-09 DIAGNOSIS — Z7984 Long term (current) use of oral hypoglycemic drugs: Secondary | ICD-10-CM

## 2023-03-09 DIAGNOSIS — E119 Type 2 diabetes mellitus without complications: Secondary | ICD-10-CM

## 2023-03-09 DIAGNOSIS — E785 Hyperlipidemia, unspecified: Secondary | ICD-10-CM

## 2023-03-09 DIAGNOSIS — E1159 Type 2 diabetes mellitus with other circulatory complications: Secondary | ICD-10-CM | POA: Diagnosis not present

## 2023-03-09 DIAGNOSIS — Z6832 Body mass index (BMI) 32.0-32.9, adult: Secondary | ICD-10-CM

## 2023-03-09 DIAGNOSIS — Z7985 Long-term (current) use of injectable non-insulin antidiabetic drugs: Secondary | ICD-10-CM

## 2023-03-09 DIAGNOSIS — E669 Obesity, unspecified: Secondary | ICD-10-CM

## 2023-03-09 LAB — CBC WITH DIFFERENTIAL/PLATELET
Basophils Absolute: 0 10*3/uL (ref 0.0–0.1)
Basophils Relative: 0.7 % (ref 0.0–3.0)
Eosinophils Absolute: 0.2 10*3/uL (ref 0.0–0.7)
Eosinophils Relative: 3.2 % (ref 0.0–5.0)
HCT: 45.2 % (ref 39.0–52.0)
Hemoglobin: 14.8 g/dL (ref 13.0–17.0)
Lymphocytes Relative: 22.3 % (ref 12.0–46.0)
Lymphs Abs: 1.3 10*3/uL (ref 0.7–4.0)
MCHC: 32.8 g/dL (ref 30.0–36.0)
MCV: 85.9 fl (ref 78.0–100.0)
Monocytes Absolute: 0.4 10*3/uL (ref 0.1–1.0)
Monocytes Relative: 7.5 % (ref 3.0–12.0)
Neutro Abs: 3.9 10*3/uL (ref 1.4–7.7)
Neutrophils Relative %: 66.3 % (ref 43.0–77.0)
Platelets: 192 10*3/uL (ref 150.0–400.0)
RBC: 5.26 Mil/uL (ref 4.22–5.81)
RDW: 14.7 % (ref 11.5–15.5)
WBC: 5.9 10*3/uL (ref 4.0–10.5)

## 2023-03-09 LAB — HEMOGLOBIN A1C: Hemoglobin A1C: 6.4

## 2023-03-09 LAB — IBC + FERRITIN
Ferritin: 76 ng/mL (ref 22.0–322.0)
Iron: 114 ug/dL (ref 42–165)
Saturation Ratios: 24.5 % (ref 20.0–50.0)
TIBC: 466.2 ug/dL — ABNORMAL HIGH (ref 250.0–450.0)
Transferrin: 333 mg/dL (ref 212.0–360.0)

## 2023-03-09 MED ORDER — EMPAGLIFLOZIN 25 MG PO TABS: 25.0000 mg | ORAL_TABLET | Freq: Every day | ORAL | 3 refills | Status: DC

## 2023-03-09 NOTE — Patient Instructions (Addendum)
Please continue:  - Metformin ER 1000 mg 2x a day, with meals - Jardiance 25 mg before b'fast - Ozempic 2 mg weekly   Please return in 4-6 months.

## 2023-03-09 NOTE — Progress Notes (Signed)
Patient ID: [name] Love, male   DOB: 1955-12-30, 67 y.o.   MRN: [id number]   HPI: [name] Love is a 67 y.o.-year-old male, initially referred by his PCP, Dr. Alvy Bimler, returning for follow-up for DM2, dx in ~2008, non-insulin-dependent, uncontrolled, with long term complications (CAD - s/p CABG in 2007, CKD, PN, DR).  Last visit 5 months ago.  Interim history: No increased urination, blurry vision, nausea, chest pain.  He still has back pain. Previously had steroid injections for this, then, had 06/08/2022 spinal stenosis surgery. He then had to have a nerve ablation >> did not work very well. He had Covid19 02/26/2023, right after he returned from a 3-week excursion/cruise to [place], [place] and [place].  Reviewing HbA1c levels: Lab Results  Component Value Date   HGBA1C 6.8 (A) 10/19/2022   HGBA1C 6.8 Repeated and verified X2. (H) 07/06/2022   HGBA1C 6.7 (A) 06/19/2022   HGBA1C 6.6 (H) 06/01/2022   HGBA1C 6.8 (A) 02/06/2022   HGBA1C 7.1 (A) 09/19/2021   HGBA1C 7.1 (A) 05/16/2021   HGBA1C 7.3 (A) 01/03/2021   HGBA1C 6.4 (A) 09/14/2020   HGBA1C 6.9 (A) 05/12/2020   HGBA1C 6.9 (A) 12/02/2019   HGBA1C 6.9 (A) 08/04/2019   HGBA1C 7.3 (H) 06/10/2019   HGBA1C 6.8 (A) 04/02/2019   HGBA1C 7.0 (A) 11/05/2018  He got a steroid inj in shoulder >> 05/10/2017.  Pt is on a regimen of: - Metformin 1000 mg 2x a day, with meals >> ER formulation -improved diarrhea - Farxiga 10 mg >> Jardiance 25 before b'fast - Trulicity 1.5 mg weekly-started 05/2017 >> 3 mg weekly >> Ozempic 1 >> 2 mg weekly We stopped glipizide in 11/2019.  He checks his sugars more than 4 times a day with his freestyle libre CGM - [facility]:   Previously:  Previously:   Lowest sugar was 66 (Libre) >> ... 80 >> 90s >> 90; it is unclear at which level he has hypoglycemia awareness. Highest sugar was 298 >> 200s >> 200s >> 200s.  Pt's meals are: - Breakfast: bagel, OJ >> cereals >> occas.  Cereals, or eggs  >> stopped OJ  - Lunch: soup or sandwich or leftovers - Dinner: meat, veggies - Snacks: not daily  -+ Mild CKD, last BUN/creatinine:  Lab Results  Component Value Date   BUN 10 07/06/2022   BUN 9 06/01/2022   CREATININE 1.00 07/06/2022   CREATININE 0.91 06/01/2022  On losartan 50.  -+ HL; last set of lipids: Lab Results  Component Value Date   CHOL 77 07/06/2022   HDL 30.50 (L) 07/06/2022   LDLCALC 25 07/04/2021   LDLDIRECT 21.0 07/06/2022   TRIG 314.0 (H) 07/06/2022   CHOLHDL 3 07/06/2022  On Lipitor, omega-3 fatty acids 3000 mg 2x a day.  - last eye exam was 01/2023: + DR.  Dr. London Sheer.   -+ Numbness and tingling in his feet.  Last foot exam 10/19/2022.  On ASA 81.  He has a history of a slightly elevated TSH, which normalized at last visit: Lab Results  Component Value Date   TSH 1.67 09/19/2021   TSH 5.170 (H) 06/24/2020   TSH 2.918 05/22/2013   TSH 1.843 09/12/2012   ROS: + See HPI  I reviewed pt's medications, allergies, PMH, social hx, family hx, and changes were documented in the history of present illness. Otherwise, unchanged from my initial visit note.  Past Medical History:  Diagnosis Date   Anemia    Arthritis    Cancer (HCC)  skin cancer on left posterior neck - excised   Cataract    Coronary artery disease    post CABG x3 in 2007   Diabetes mellitus    type 2   GERD (gastroesophageal reflux disease)    Headache    no current problem   Hearing loss    some hearing loss bilateral - no hearing aids   History of kidney stones 1990   passed stone   Hyperlipidemia    Hypertension    Obesity    Past Surgical History:  Procedure Laterality Date   CARDIAC CATHETERIZATION  EF= 45-50%   EF= 45-50% -- Three-vessel coronary artery disease with high-grade lesions in the proximal left anterior descending artery and multiple lesions in the right coronary -- Low normal ejection fraction -- Stable exertional angina -- Arvilla Meres, M.D. [facility]   COLONOSCOPY  09/26/2021   CORONARY ARTERY BYPASS GRAFT  02/19/2006    x3 (left internal mammary artery to LAD, left radial artery to posterior descending, saphenous vein graft to first diagonal), endoscopic vein harvest right leg -- SURGEON:  Salvatore Decent. Dorris Fetch, M.D.   HEMI-MICRODISCECTOMY LUMBAR LAMINECTOMY LEVEL 1 Left 06/08/2022   Procedure: Micro hemi laminectomy Lumbar Five-Sacral One left;  Surgeon: Jene Every, MD;  Location: [facility] OR;  Service: Orthopedics;  Laterality: Left;  90 mins 3 C-Bed   HERNIA REPAIR     at 67 y/o   SHOULDER ARTHROSCOPY Right 2007   tripple bypass     UPPER GASTROINTESTINAL ENDOSCOPY  09/26/2021   Social History   Socioeconomic History   Marital status: Married    Spouse name: Not on file   Number of children: 3  Social Needs  Occupational History   unemployed  Tobacco Use   Smoking status: Never Smoker   Smokeless tobacco: Never Used  Substance and Sexual Activity   Alcohol use: Yes    Alcohol/week:     Types: 1-2 per mo   Drug use: No      Other Topics      Social History Narrative      Current Outpatient Medications on File Prior to Visit  Medication Sig Dispense Refill   acetaminophen (TYLENOL) 500 MG tablet Take 1,000 mg by mouth every 8 (eight) hours as needed for moderate pain.     ascorbic acid (VITAMIN C) 500 MG tablet Take 500 mg by mouth daily.     atorvastatin (LIPITOR) 40 MG tablet Take 1 tablet by mouth once daily 90 tablet 2   ferrous sulfate 325 (65 FE) MG EC tablet Take 325 mg by mouth daily.     JARDIANCE 25 MG TABS tablet TAKE 1 TABLET BY MOUTH ONCE DAILY BEFORE BREAKFAST 90 tablet 0   losartan (COZAAR) 50 MG tablet Take 1 tablet by mouth once daily 90 tablet 0   Magnesium Glycinate 665 MG CAPS Take 1 tablet by mouth in the morning and at bedtime.     metFORMIN (GLUCOPHAGE) 1000 MG tablet Take 1 tablet (1,000 mg total) by mouth 2 (two) times daily with a meal. 180 tablet 3   methocarbamol  (ROBAXIN-750) 750 MG tablet Take 1 tablet (750 mg total) by mouth every 8 (eight) hours as needed for muscle spasms. 30 tablet 1   omeprazole (PRILOSEC) 20 MG capsule Take 1 capsule (20 mg total) by mouth daily. 90 capsule 0   omeprazole (PRILOSEC) 20 MG capsule Take 1 capsule (20 mg total) by mouth daily. 60 capsule 2   Semaglutide, 2 MG/DOSE, (OZEMPIC,  2 MG/DOSE,) 8 MG/3ML SOPN Inject 2 mg into the skin once a week. 9 mL 3   No current facility-administered medications on file prior to visit.   No Known Allergies Family History  Problem Relation Age of Onset   Heart disease Mother    Colon cancer Mother        18   Hypertension Mother    Cancer Father        type unknown   Kidney disease Maternal Aunt    Diabetes Maternal Uncle    Heart attack Maternal Grandfather 28   Colon polyps Neg Hx    Esophageal cancer Neg Hx    Rectal cancer Neg Hx    Stomach cancer Neg Hx    PE: BP 126/70   Pulse 79   Ht 5\' 10"  (1.778 m)   Wt 193 lb (87.5 kg)   SpO2 96%   BMI 27.69 kg/m  Wt Readings from Last 3 Encounters:  03/09/23 193 lb (87.5 kg)  10/19/22 202 lb (91.6 kg)  09/01/22 195 lb 3.2 oz (88.5 kg)   Constitutional: overweight, in NAD Eyes: EOMI, no exophthalmos ENT: no thyromegaly, no cervical lymphadenopathy Cardiovascular: RRR, No MRG Respiratory: CTA B Musculoskeletal: no deformities Skin: no rashes Neurological: no tremor with outstretched hands  ASSESSMENT: 1. DM2, non-insulin-dependent, uncontrolled, without complications - CAD - s/p CABG in 2007 - CKD - PN - DR  2. HL  3. Obesity class I  PLAN:  1. Patient with Longstanding, uncontrolled, type 2 diabetes, on metformin ER, SGLT2 inhibitor and GLP-1 receptor agonist, with fair control.  At last visit, HbA1c was stable, at 6.8%, but reviewing the CGM download, the projected HbA1c from the previous 2 weeks was higher, at 7.3%.  Sugars were increasing even before eating in the morning and then more significantly after  lunch and less significantly after dinner.  We increased his Ozempic dose at that time.  We discussed that if this was not helping, we could use a low-dose glipizide ER when waking up.  We continued the rest of the regimen. CGM interpretation: -At today's visit, we reviewed his CGM downloads: It appears that 85% of values are in target range (goal >70%), while 15% are higher than 180 (goal <25%), and 0% are lower than 70 (goal <4%).  The calculated average blood sugar is 145.  The projected HbA1c for the next 3 months (GMI) is 6.8%. -Reviewing the CGM trends, sugars appear to be better control (despite his recent vacation), with the vast majority of the blood sugars at goal, and only an occasional slightly higher blood sugar especially after dinner.  It appears that the higher dose of Ozempic is working well for him.  He is not having side effects from it.  For now, we can continue the current regimen. - I suggested to:  Patient Instructions  Please continue:  - Metformin ER 1000 mg 2x a day, with meals - Jardiance 25 mg before b'fast - Ozempic 2 mg weekly   Please return in 4-6 months.  - we checked his HbA1c: 6.4% (improved) - advised to check sugars at different times of the day - 4x a day, rotating check times - advised for yearly eye exams >> he is UTD - he has significant onychodystrophy and decreased pulses in his legs.  He also has some trophic changes in his feet.  I recommended to see podiatry.  He did not see them yet.  I again recommended [facility] at today's visit. -  return to clinic in 4-6 months  2.  Hyperlipidemia -Latest lipid panel from 06/2022 was reviewed: LDL at goal, HDL low, triglycerides elevated: Lab Results  Component Value Date   CHOL 77 07/06/2022   HDL 30.50 (L) 07/06/2022   LDLCALC 25 07/04/2021   LDLDIRECT 21.0 07/06/2022   TRIG 314.0 (H) 07/06/2022   CHOLHDL 3 07/06/2022  -He continues with Lipitor 40 mg daily, omega-3 fatty acids  3.  Obesity  class I -continue SGLT 2 inhibitor and GLP-1 receptor agonist which should also help with weight loss -Before last visit, he gained 7 pounds.  We increased the Ozempic dose at that time. -She lost 9 pounds since last visit, despite being in vacation for 3 weeks recently  Carlus Pavlov, MD PhD [facility]

## 2023-03-13 ENCOUNTER — Other Ambulatory Visit: Payer: Self-pay

## 2023-03-13 DIAGNOSIS — D509 Iron deficiency anemia, unspecified: Secondary | ICD-10-CM

## 2023-03-13 NOTE — Progress Notes (Signed)
Mardelle Matte,  Your iron stores have continued to improve, and are essentially normal.  Your ferritin is well above the lower limit of normal now.  I think it is reasonable to stop the iron supplement. If you wanted to continue it, that would be okay too. Let's repeat CBC and iron panel in 6 months.

## 2023-03-14 ENCOUNTER — Encounter: Payer: Self-pay | Admitting: Interventional Cardiology

## 2023-03-14 ENCOUNTER — Encounter: Payer: Self-pay | Admitting: Gastroenterology

## 2023-03-15 MED ORDER — ASPIRIN 81 MG PO TBEC: 81.0000 mg | DELAYED_RELEASE_TABLET | Freq: Every day | ORAL | Status: AC

## 2023-03-22 ENCOUNTER — Encounter: Payer: Self-pay | Admitting: Internal Medicine

## 2023-03-22 LAB — HM DIABETES EYE EXAM

## 2023-03-23 ENCOUNTER — Encounter: Payer: Self-pay | Admitting: Gastroenterology

## 2023-04-18 DIAGNOSIS — M170 Bilateral primary osteoarthritis of knee: Secondary | ICD-10-CM | POA: Diagnosis not present

## 2023-04-18 DIAGNOSIS — M25562 Pain in left knee: Secondary | ICD-10-CM | POA: Diagnosis not present

## 2023-04-18 DIAGNOSIS — M1712 Unilateral primary osteoarthritis, left knee: Secondary | ICD-10-CM | POA: Diagnosis not present

## 2023-04-26 DIAGNOSIS — E1159 Type 2 diabetes mellitus with other circulatory complications: Secondary | ICD-10-CM | POA: Diagnosis not present

## 2023-05-10 ENCOUNTER — Other Ambulatory Visit: Payer: Self-pay | Admitting: Interventional Cardiology

## 2023-05-15 DIAGNOSIS — M170 Bilateral primary osteoarthritis of knee: Secondary | ICD-10-CM | POA: Diagnosis not present

## 2023-05-22 DIAGNOSIS — M170 Bilateral primary osteoarthritis of knee: Secondary | ICD-10-CM | POA: Diagnosis not present

## 2023-05-29 ENCOUNTER — Encounter: Payer: Self-pay | Admitting: Emergency Medicine

## 2023-05-29 ENCOUNTER — Ambulatory Visit: Payer: Medicare PPO

## 2023-05-29 ENCOUNTER — Ambulatory Visit: Payer: Medicare PPO | Admitting: Emergency Medicine

## 2023-05-29 VITALS — BP 138/64 | HR 83 | Temp 97.8°F | Ht 70.0 in | Wt 197.0 lb

## 2023-05-29 DIAGNOSIS — Z7985 Long-term (current) use of injectable non-insulin antidiabetic drugs: Secondary | ICD-10-CM | POA: Diagnosis not present

## 2023-05-29 DIAGNOSIS — Z125 Encounter for screening for malignant neoplasm of prostate: Secondary | ICD-10-CM | POA: Diagnosis not present

## 2023-05-29 DIAGNOSIS — E1169 Type 2 diabetes mellitus with other specified complication: Secondary | ICD-10-CM

## 2023-05-29 DIAGNOSIS — R051 Acute cough: Secondary | ICD-10-CM

## 2023-05-29 DIAGNOSIS — E1159 Type 2 diabetes mellitus with other circulatory complications: Secondary | ICD-10-CM

## 2023-05-29 DIAGNOSIS — J069 Acute upper respiratory infection, unspecified: Secondary | ICD-10-CM

## 2023-05-29 DIAGNOSIS — E785 Hyperlipidemia, unspecified: Secondary | ICD-10-CM

## 2023-05-29 DIAGNOSIS — M170 Bilateral primary osteoarthritis of knee: Secondary | ICD-10-CM | POA: Diagnosis not present

## 2023-05-29 DIAGNOSIS — I152 Hypertension secondary to endocrine disorders: Secondary | ICD-10-CM | POA: Diagnosis not present

## 2023-05-29 LAB — LIPID PANEL
Cholesterol: 86 mg/dL (ref 0–200)
HDL: 36.2 mg/dL — ABNORMAL LOW (ref >39.00)
LDL Cholesterol: 3 mg/dL (ref 0–99)
NonHDL: 50.23
Total CHOL/HDL Ratio: 2
Triglycerides: 234 mg/dL — ABNORMAL HIGH (ref 0.0–149.0)
VLDL: 46.8 mg/dL — ABNORMAL HIGH (ref 0.0–40.0)

## 2023-05-29 LAB — CBC WITH DIFFERENTIAL/PLATELET
Basophils Absolute: 0.1 10*3/uL (ref 0.0–0.1)
Basophils Relative: 0.7 % (ref 0.0–3.0)
Eosinophils Absolute: 0.2 10*3/uL (ref 0.0–0.7)
Eosinophils Relative: 2 % (ref 0.0–5.0)
HCT: 44.9 % (ref 39.0–52.0)
Hemoglobin: 14.7 g/dL (ref 13.0–17.0)
Lymphocytes Relative: 10.1 % — ABNORMAL LOW (ref 12.0–46.0)
Lymphs Abs: 0.9 10*3/uL (ref 0.7–4.0)
MCHC: 32.7 g/dL (ref 30.0–36.0)
MCV: 85.7 fL (ref 78.0–100.0)
Monocytes Absolute: 0.4 10*3/uL (ref 0.1–1.0)
Monocytes Relative: 4.8 % (ref 3.0–12.0)
Neutro Abs: 7.8 10*3/uL — ABNORMAL HIGH (ref 1.4–7.7)
Neutrophils Relative %: 82.4 % — ABNORMAL HIGH (ref 43.0–77.0)
Platelets: 218 10*3/uL (ref 150.0–400.0)
RBC: 5.24 Mil/uL (ref 4.22–5.81)
RDW: 14.2 % (ref 11.5–15.5)
WBC: 9.4 10*3/uL (ref 4.0–10.5)

## 2023-05-29 LAB — COMPREHENSIVE METABOLIC PANEL
ALT: 12 U/L (ref 0–53)
AST: 10 U/L (ref 0–37)
Albumin: 4.3 g/dL (ref 3.5–5.2)
Alkaline Phosphatase: 49 U/L (ref 39–117)
BUN: 9 mg/dL (ref 6–23)
CO2: 30 meq/L (ref 19–32)
Calcium: 9.1 mg/dL (ref 8.4–10.5)
Chloride: 101 meq/L (ref 96–112)
Creatinine, Ser: 0.85 mg/dL (ref 0.40–1.50)
GFR: 90.31 mL/min (ref >60.00)
Glucose, Bld: 185 mg/dL — ABNORMAL HIGH (ref 70–99)
Potassium: 3.9 meq/L (ref 3.5–5.1)
Sodium: 141 meq/L (ref 135–145)
Total Bilirubin: 0.6 mg/dL (ref 0.2–1.2)
Total Protein: 6.9 g/dL (ref 6.0–8.3)

## 2023-05-29 LAB — HEMOGLOBIN A1C: Hgb A1c MFr Bld: 6.9 % — ABNORMAL HIGH (ref 4.6–6.5)

## 2023-05-29 LAB — PSA: PSA: 0.96 ng/mL (ref 0.10–4.00)

## 2023-05-29 MED ORDER — HYDROCODONE BIT-HOMATROP MBR 5-1.5 MG/5ML PO SOLN
5.0000 mL | Freq: Every evening | ORAL | 0 refills | Status: DC | PRN
Start: 2023-05-29 — End: 2023-06-05

## 2023-05-29 MED ORDER — BENZONATATE 200 MG PO CAPS: 200.0000 mg | ORAL_CAPSULE | Freq: Two times a day (BID) | ORAL | 0 refills | Status: DC | PRN

## 2023-05-29 NOTE — Patient Instructions (Signed)

## 2023-05-29 NOTE — Assessment & Plan Note (Signed)
Stable chronic conditions Continue Jardiance, metformin, and Ozempic as detailed above Continue atorvastatin 40 mg daily

## 2023-05-29 NOTE — Assessment & Plan Note (Signed)
Clinically stable and running its course.  No signs of secondary bacterial infection.  No signs or symptoms of pneumonia. Chest x-ray done today.  Report reviewed. Symptom management discussed. Advised to contact the office if no better or worse during the next several days Advised to rest and stay well-hydrated

## 2023-05-29 NOTE — Assessment & Plan Note (Signed)
BP Readings from Last 3 Encounters:  05/29/23 138/64  03/09/23 126/70  10/19/22 (!) 140/70  Well-controlled hypertension Continue losartan 50 mg daily Lab Results  Component Value Date   HGBA1C 6.8 (A) 10/19/2022  Well-controlled diabetes Continue weekly Ozempic 2 mg along with daily metformin 1000 mg twice a day and Jardiance 25 mg daily Has follow-up appointment with me in 1 month We will further assess then

## 2023-05-29 NOTE — Assessment & Plan Note (Signed)
Cough management discussed Recommend to continue over-the-counter Mucinex DM and cough drops Also start Tessalon 200 mg 3 times a day and Hycodan syrup at bedtime

## 2023-05-29 NOTE — Progress Notes (Signed)
[name] Love 67 y.o.   Chief Complaint  Patient presents with   Cough    Patient had Covid in early August. Off and on coughing since then     HISTORY OF PRESENT ILLNESS: This is a 67 y.o. male A1A complaining of cough and congestion that started several days ago. Had COVID infection last August.  Has had lingering cough since No other associated symptoms at this time.  Denies difficulty breathing or chest pain. No other complaints or medical concerns today.  Cough Pertinent negatives include no chest pain, chills, fever, headaches, hemoptysis, rash, sore throat, shortness of breath or wheezing.     Prior to Admission medications   Medication Sig Start Date End Date Taking? Authorizing Provider  acetaminophen (TYLENOL) 500 MG tablet Take 1,000 mg by mouth every 8 (eight) hours as needed for moderate pain.    [provider]  ascorbic acid (VITAMIN C) 500 MG tablet Take 500 mg by mouth daily.    [provider]  aspirin EC 81 MG tablet Take 1 tablet (81 mg total) by mouth daily. Swallow whole. 03/15/23   Corky Crafts, MD  atorvastatin (LIPITOR) 40 MG tablet Take 1 tablet by mouth once daily 05/10/23   Alver Sorrow, NP  empagliflozin (JARDIANCE) 25 MG TABS tablet Take 1 tablet (25 mg total) by mouth daily before breakfast. 03/09/23   Carlus Pavlov, MD  ferrous sulfate 325 (65 FE) MG EC tablet Take 325 mg by mouth daily.    [provider]  losartan (COZAAR) 50 MG tablet Take 1 tablet by mouth once daily 03/05/23   Georgina Quint, MD  Magnesium Glycinate 665 MG CAPS Take 1 tablet by mouth in the morning and at bedtime.    [provider]  metFORMIN (GLUCOPHAGE) 1000 MG tablet Take 1 tablet (1,000 mg total) by mouth 2 (two) times daily with a meal. 11/24/22   Carlus Pavlov, MD  methocarbamol (ROBAXIN-750) 750 MG tablet Take 1 tablet (750 mg total) by mouth every 8 (eight) hours as needed for muscle spasms. 06/08/22    Jene Every, MD  omeprazole (PRILOSEC) 20 MG capsule Take 1 capsule (20 mg total) by mouth daily. 02/13/23   Arnaldo Natal, NP  Semaglutide, 2 MG/DOSE, (OZEMPIC, 2 MG/DOSE,) 8 MG/3ML SOPN Inject 2 mg into the skin once a week. 10/19/22   Carlus Pavlov, MD    No Known Allergies  Patient Active Problem List   Diagnosis Date Noted   Spinal stenosis at L4-L5 level 06/08/2022   Lumbar spondylosis 05/21/2022   Skin lesion 04/17/2022   Lumbar radiculopathy 08/05/2021   Pain of left hip joint 07/07/2021   Degeneration of thoracic intervertebral disc 06/29/2020   Pain in thoracic spine 06/29/2020   Tendinitis of hip 10/09/2017   Decreased calculated GFR 08/08/2017   Bilateral adhesive capsulitis of shoulders 07/26/2017   Erectile dysfunction 09/15/2013   Nonspecific abnormal electrocardiogram (ECG) (EKG) 06/10/2013   Poorly controlled type 2 diabetes mellitus with circulatory disorder (HCC) 09/12/2012   Coronary artery disease 07/14/2011   History of coronary artery bypass surgery 07/14/2011   Hyperlipidemia 02/23/2009   Hypertension associated with diabetes (HCC) 02/23/2009    Past Medical History:  Diagnosis Date   Anemia    Arthritis    Cancer (HCC)    skin cancer on left posterior neck - excised   Cataract    Coronary artery disease    post CABG x3 in 2007   Diabetes mellitus    type  2   GERD (gastroesophageal reflux disease)    Headache    no current problem   Hearing loss    some hearing loss bilateral - no hearing aids   History of kidney stones 1990   passed stone   Hyperlipidemia    Hypertension    Obesity     Past Surgical History:  Procedure Laterality Date   CARDIAC CATHETERIZATION  EF= 45-50%   EF= 45-50% -- Three-vessel coronary artery disease with high-grade lesions in the proximal left anterior descending artery and multiple lesions in the right coronary -- Low normal ejection fraction -- Stable exertional angina -- Arvilla Meres, M.D.  [facility]   COLONOSCOPY  09/26/2021   CORONARY ARTERY BYPASS GRAFT  02/19/2006    x3 (left internal mammary artery to LAD, left radial artery to posterior descending, saphenous vein graft to first diagonal), endoscopic vein harvest right leg -- SURGEON:  Salvatore Decent. Dorris Fetch, M.D.   HEMI-MICRODISCECTOMY LUMBAR LAMINECTOMY LEVEL 1 Left 06/08/2022   Procedure: Micro hemi laminectomy Lumbar Five-Sacral One left;  Surgeon: Jene Every, MD;  Location: [facility] OR;  Service: Orthopedics;  Laterality: Left;  90 mins 3 C-Bed   HERNIA REPAIR     at 67 y/o   SHOULDER ARTHROSCOPY Right 2007   tripple bypass     UPPER GASTROINTESTINAL ENDOSCOPY  09/26/2021    Social History   Socioeconomic History   Marital status: Married    Spouse name: Not on file   Number of children: 3   Years of education: Not on file   Highest education level: Bachelor's degree (e.g., BA, AB, BS)  Occupational History   Occupation: retired  Tobacco Use   Smoking status: Never   Smokeless tobacco: Never  Vaping Use   Vaping status: Never Used  Substance and Sexual Activity   Alcohol use: Yes    Alcohol/week: 1.0 standard drink of alcohol    Types: 1 Standard drinks or equivalent per week    Comment: 1 drink monthly   Drug use: Never   Sexual activity: Yes  Other Topics Concern   Not on file  Social History Narrative   Not on file   Social Determinants of Health   Financial Resource Strain: Low Risk  (05/28/2023)   Overall Financial Resource Strain (CARDIA)    Difficulty of Paying Living Expenses: Not hard at all  Food Insecurity: No Food Insecurity (05/28/2023)   Hunger Vital Sign    Worried About Running Out of Food in the Last Year: Never true    Ran Out of Food in the Last Year: Never true  Transportation Needs: No Transportation Needs (05/28/2023)   PRAPARE - Administrator, Civil Service (Medical): No    Lack of Transportation (Non-Medical): No  Physical Activity: Unknown (05/28/2023)   Exercise  Vital Sign    Days of Exercise per Week: 2 days    Minutes of Exercise per Session: Patient declined  Stress: No Stress Concern Present (05/28/2023)   [employer] of Occupational Health - Occupational Stress Questionnaire    Feeling of Stress : Not at all  Social Connections: Moderately Integrated (05/28/2023)   Social Connection and Isolation Panel [NHANES]    Frequency of Communication with Friends and Family: Three times a week    Frequency of Social Gatherings with Friends and Family: Twice a week    Attends Religious Services: Never    Database administrator or Organizations: Yes    Attends Engineer, structural: More than  4 times per year    Marital Status: Married  Catering manager Violence: Not on file    Family History  Problem Relation Age of Onset   Heart disease Mother    Colon cancer Mother        79   Hypertension Mother    Cancer Father        type unknown   Kidney disease Maternal Aunt    Diabetes Maternal Uncle    Heart attack Maternal Grandfather 79   Colon polyps Neg Hx    Esophageal cancer Neg Hx    Rectal cancer Neg Hx    Stomach cancer Neg Hx      Review of Systems  Constitutional: Negative.  Negative for chills and fever.  HENT:  Positive for congestion. Negative for sore throat.   Respiratory:  Positive for cough. Negative for hemoptysis, sputum production, shortness of breath and wheezing.   Cardiovascular: Negative.  Negative for chest pain and palpitations.  Gastrointestinal:  Negative for abdominal pain, diarrhea, nausea and vomiting.  Genitourinary: Negative.  Negative for dysuria and hematuria.  Skin: Negative.  Negative for rash.  Neurological: Negative.  Negative for dizziness and headaches.  All other systems reviewed and are negative.   Today's Vitals   05/29/23 1324  BP: 138/64  Pulse: 83  Temp: 97.8 F (36.6 C)  TempSrc: Oral  SpO2: 98%  Weight: 197 lb (89.4 kg)  Height: 5\' 10"  (1.778 m)   Body mass index is  28.27 kg/m.   Physical Exam Vitals reviewed.  Constitutional:      Appearance: Normal appearance.  HENT:     Head: Normocephalic.     Mouth/Throat:     Mouth: Mucous membranes are moist.     Pharynx: Oropharynx is clear.  Eyes:     Extraocular Movements: Extraocular movements intact.     Pupils: Pupils are equal, round, and reactive to light.  Cardiovascular:     Rate and Rhythm: Normal rate and regular rhythm.     Pulses: Normal pulses.     Heart sounds: Normal heart sounds.  Pulmonary:     Effort: Pulmonary effort is normal.     Breath sounds: Normal breath sounds.  Musculoskeletal:     Cervical back: No tenderness.  Lymphadenopathy:     Cervical: No cervical adenopathy.  Skin:    General: Skin is warm and dry.     Capillary Refill: Capillary refill takes less than 2 seconds.  Neurological:     General: No focal deficit present.     Mental Status: He is alert and oriented to person, place, and time.  Psychiatric:        Mood and Affect: Mood normal.        Behavior: Behavior normal.      ASSESSMENT & PLAN: A total of 33 minutes was spent with the patient and counseling/coordination of care regarding preparing for this visit, review of most recent office visit notes, review of multiple chronic medical conditions under management, review of all medications, diagnosis of upper respiratory viral infection and management, prognosis, cough management, documentation, and need for follow-up if no better or worse during the next several days.  Problem List Items Addressed This Visit       Cardiovascular and Mediastinum   Hypertension associated with diabetes (HCC)    BP Readings from Last 3 Encounters:  05/29/23 138/64  03/09/23 126/70  10/19/22 (!) 140/70  Well-controlled hypertension Continue losartan 50 mg daily Lab Results  Component Value  Date   HGBA1C 6.8 (A) 10/19/2022  Well-controlled diabetes Continue weekly Ozempic 2 mg along with daily metformin 1000 mg  twice a day and Jardiance 25 mg daily Has follow-up appointment with me in 1 month We will further assess then        Relevant Orders   CBC with Differential/Platelet   Comprehensive metabolic panel   Hemoglobin A1c   Lipid panel     Respiratory   Viral upper respiratory tract infection - Primary    Clinically stable and running its course.  No signs of secondary bacterial infection.  No signs or symptoms of pneumonia. Chest x-ray done today.  Report reviewed. Symptom management discussed. Advised to contact the office if no better or worse during the next several days Advised to rest and stay well-hydrated      Relevant Medications   benzonatate (TESSALON) 200 MG capsule   HYDROcodone bit-homatropine (HYCODAN) 5-1.5 MG/5ML syrup   Other Relevant Orders   DG Chest 2 View     Endocrine   Dyslipidemia associated with type 2 diabetes mellitus (HCC)    Stable chronic conditions Continue Jardiance, metformin, and Ozempic as detailed above Continue atorvastatin 40 mg daily      Relevant Orders   CBC with Differential/Platelet   Comprehensive metabolic panel   Hemoglobin A1c   Lipid panel     Other   Acute cough    Cough management discussed Recommend to continue over-the-counter Mucinex DM and cough drops Also start Tessalon 200 mg 3 times a day and Hycodan syrup at bedtime      Relevant Medications   benzonatate (TESSALON) 200 MG capsule   HYDROcodone bit-homatropine (HYCODAN) 5-1.5 MG/5ML syrup   Other Relevant Orders   DG Chest 2 View   Other Visit Diagnoses     Screening for prostate cancer       Relevant Orders   PSA      Patient Instructions  Cough, Adult A cough helps to clear your throat and lungs. It may be a sign of an illness or another condition. A short-term (acute) cough may last 2-3 weeks. A long-term (chronic) cough may last 8 or more weeks. Many things can cause a cough. They include: Illnesses such as: An infection in your throat or  lungs. Asthma or other heart or lung problems. Gastroesophageal reflux. This is when acid comes back up from your stomach. Breathing in things that bother (irritate) your lungs. Allergies. Postnasal drip. This is when mucus runs down the back of your throat. Smoking. Some medicines. Follow these instructions at home: Medicines Take over-the-counter and prescription medicines only as told by your doctor. Talk with your doctor before you take cough medicine (cough suppressants). Eating and drinking Do not drink alcohol. Do not drink caffeine. Drink enough fluid to keep your pee (urine) pale yellow. Lifestyle Stay away from cigarette smoke. Do not smoke or use any products that contain nicotine or tobacco. If you need help quitting, ask your doctor. Stay away from things that make you cough. These may include perfume, candles, cleaning products, or campfire smoke. General instructions  Watch for any changes to your cough. Tell your doctor about them. Always cover your mouth when you cough. If the air is dry in your home, use a cool mist vaporizer or humidifier. If your cough is worse at night, try using extra pillows to raise your head up higher while you sleep. Rest as needed. Contact a doctor if: You have new symptoms. Your symptoms get  worse. You cough up pus. You have a fever that does not go away. Your cough does not get better after 2-3 weeks. Cough medicine does not help, and you are not sleeping well. You have pain that gets worse or is not helped with medicine. You are losing weight and do not know why. You have night sweats. Get help right away if: You cough up blood. You have trouble breathing. Your heart is beating very fast. These symptoms may be an emergency. Get help right away. Call 911. Do not wait to see if the symptoms will go away. Do not drive yourself to the hospital. This information is not intended to replace advice given to you by your health care  provider. Make sure you discuss any questions you have with your health care provider. Document Revised: 03/10/2022 Document Reviewed: 03/10/2022 Elsevier Patient Education  2024 Elsevier Inc.    Edwina Barth, MD [facility]

## 2023-06-03 ENCOUNTER — Encounter: Payer: Self-pay | Admitting: Emergency Medicine

## 2023-06-04 ENCOUNTER — Other Ambulatory Visit: Payer: Self-pay | Admitting: Emergency Medicine

## 2023-06-04 DIAGNOSIS — Z85828 Personal history of other malignant neoplasm of skin: Secondary | ICD-10-CM | POA: Diagnosis not present

## 2023-06-04 DIAGNOSIS — L570 Actinic keratosis: Secondary | ICD-10-CM | POA: Diagnosis not present

## 2023-06-04 DIAGNOSIS — L821 Other seborrheic keratosis: Secondary | ICD-10-CM | POA: Diagnosis not present

## 2023-06-04 DIAGNOSIS — L814 Other melanin hyperpigmentation: Secondary | ICD-10-CM | POA: Diagnosis not present

## 2023-06-04 DIAGNOSIS — I10 Essential (primary) hypertension: Secondary | ICD-10-CM

## 2023-06-04 DIAGNOSIS — D225 Melanocytic nevi of trunk: Secondary | ICD-10-CM | POA: Diagnosis not present

## 2023-06-04 DIAGNOSIS — Z08 Encounter for follow-up examination after completed treatment for malignant neoplasm: Secondary | ICD-10-CM | POA: Diagnosis not present

## 2023-06-05 ENCOUNTER — Other Ambulatory Visit: Payer: Self-pay | Admitting: Emergency Medicine

## 2023-06-05 DIAGNOSIS — J069 Acute upper respiratory infection, unspecified: Secondary | ICD-10-CM

## 2023-06-05 DIAGNOSIS — R051 Acute cough: Secondary | ICD-10-CM

## 2023-06-05 MED ORDER — HYDROCODONE BIT-HOMATROP MBR 5-1.5 MG/5ML PO SOLN: 5.0000 mL | Freq: Every evening | ORAL | 0 refills | Status: DC | PRN

## 2023-06-05 NOTE — Telephone Encounter (Signed)
New prescription sent to pharmacy of record today.  One-time only.  Thanks.

## 2023-06-06 DIAGNOSIS — M25521 Pain in right elbow: Secondary | ICD-10-CM | POA: Diagnosis not present

## 2023-06-06 DIAGNOSIS — S56911A Strain of unspecified muscles, fascia and tendons at forearm level, right arm, initial encounter: Secondary | ICD-10-CM | POA: Diagnosis not present

## 2023-06-18 ENCOUNTER — Encounter: Payer: Self-pay | Admitting: Emergency Medicine

## 2023-06-18 NOTE — Telephone Encounter (Signed)
Needs to let it continue running its course.  Treat the symptoms.  Thanks.

## 2023-06-19 ENCOUNTER — Ambulatory Visit (INDEPENDENT_AMBULATORY_CARE_PROVIDER_SITE_OTHER): Payer: Medicare PPO | Admitting: Emergency Medicine

## 2023-06-19 ENCOUNTER — Encounter: Payer: Self-pay | Admitting: Emergency Medicine

## 2023-06-19 VITALS — BP 110/58 | HR 89 | Temp 97.9°F | Ht 70.0 in | Wt 194.4 lb

## 2023-06-19 DIAGNOSIS — J22 Unspecified acute lower respiratory infection: Secondary | ICD-10-CM | POA: Insufficient documentation

## 2023-06-19 DIAGNOSIS — R051 Acute cough: Secondary | ICD-10-CM | POA: Diagnosis not present

## 2023-06-19 MED ORDER — ALBUTEROL SULFATE HFA 108 (90 BASE) MCG/ACT IN AERS: 2.0000 | INHALATION_SPRAY | Freq: Two times a day (BID) | RESPIRATORY_TRACT | 0 refills | Status: DC

## 2023-06-19 MED ORDER — AZITHROMYCIN 250 MG PO TABS: ORAL_TABLET | ORAL | 0 refills | Status: DC

## 2023-06-19 MED ORDER — HYDROCODONE BIT-HOMATROP MBR 5-1.5 MG/5ML PO SOLN: 5.0000 mL | Freq: Every evening | ORAL | 0 refills | Status: DC | PRN

## 2023-06-19 MED ORDER — BENZONATATE 200 MG PO CAPS: 200.0000 mg | ORAL_CAPSULE | Freq: Two times a day (BID) | ORAL | 0 refills | Status: DC | PRN

## 2023-06-19 NOTE — Patient Instructions (Signed)
Acute Bronchitis, Adult  Acute bronchitis is when air tubes in the lungs (bronchi) suddenly get swollen. The condition can make it hard for you to breathe. In adults, acute bronchitis usually goes away within 2 weeks. A cough caused by bronchitis may last up to 3 weeks. Smoking, allergies, and asthma can make the condition worse. What are the causes? Germs that cause cold and flu (viruses). The most common cause of this condition is the virus that causes the common cold. Bacteria. Substances that bother (irritate) the lungs, including: Smoke from cigarettes and other types of tobacco. Dust and pollen. Fumes from chemicals, gases, or burned fuel. Indoor or outdoor air pollution. What increases the risk? A weak body's defense system. This is also called the immune system. Any condition that affects your lungs and breathing, such as asthma. What are the signs or symptoms? A cough. Coughing up clear, yellow, or green mucus. Making high-pitched whistling sounds when you breathe, most often when you breathe out (wheezing). Runny or stuffy nose. Having too much mucus in your lungs (chest congestion). Shortness of breath. Body aches. A sore throat. How is this treated? Acute bronchitis may go away over time without treatment. Your doctor may tell you to: Drink more fluids. This will help thin your mucus so it is easier to cough up. Use a device that gets medicine into your lungs (inhaler). Use a vaporizer or a humidifier. These are machines that add water to the air. This helps with coughing and poor breathing. Take a medicine that thins mucus and helps clear it from your lungs. Take a medicine that prevents or stops coughing. It is not common to take an antibiotic medicine for this condition. Follow these instructions at home:  Take over-the-counter and prescription medicines only as told by your doctor. Use an inhaler, vaporizer, or humidifier as told by your doctor. Take two teaspoons  (10 mL) of honey at bedtime. This helps lessen your coughing at night. Drink enough fluid to keep your pee (urine) pale yellow. Do not smoke or use any products that contain nicotine or tobacco. If you need help quitting, ask your doctor. Get a lot of rest. Return to your normal activities when your doctor says that it is safe. Keep all follow-up visits. How is this prevented?  Wash your hands often with soap and water for at least 20 seconds. If you cannot use soap and water, use hand sanitizer. Avoid contact with people who have cold symptoms. Try not to touch your mouth, nose, or eyes with your hands. Avoid breathing in smoke or chemical fumes. Make sure to get the flu shot every year. Contact a doctor if: Your symptoms do not get better in 2 weeks. You have trouble coughing up the mucus. Your cough keeps you awake at night. You have a fever. Get help right away if: You cough up blood. You have chest pain. You have very bad shortness of breath. You faint or keep feeling like you are going to faint. You have a very bad headache. Your fever or chills get worse. These symptoms may be an emergency. Get help right away. Call your local emergency services (911 in the [place] Do not wait to see if the symptoms will go away. Do not drive yourself to the hospital. Summary Acute bronchitis is when air tubes in the lungs (bronchi) suddenly get swollen. In adults, acute bronchitis usually goes away within 2 weeks. Drink more fluids. This will help thin your mucus so it is easier  to cough up. Take over-the-counter and prescription medicines only as told by your doctor. Contact a doctor if your symptoms do not improve after 2 weeks of treatment. This information is not intended to replace advice given to you by your health care provider. Make sure you discuss any questions you have with your health care provider. Document Revised: 11/10/2020 Document Reviewed: 11/10/2020 Elsevier Patient  Education  2024 [employer].

## 2023-06-19 NOTE — Assessment & Plan Note (Signed)
Clinically stable.  No signs of pneumonia. Recommend daily azithromycin for 5 days Advised to rest and stay well-hydrated May benefit from albuterol twice a day for 5 days

## 2023-06-19 NOTE — Assessment & Plan Note (Addendum)
Cough management discussed Recommend over-the-counter Mucinex and cough drops Start Tessalon 200 mg 3 times a day and Hycodan syrup at bedtime Advised to rest and stay well-hydrated Has intermittent wheezing.  Will benefit from albuterol twice a day for 5 days

## 2023-06-19 NOTE — Progress Notes (Signed)
[name] Love 67 y.o.   Chief Complaint  Patient presents with   Cough    Patient been sick on and off since July.  COVID in August after cruise  Constant Drainage after. Pt states when he coughs he feels like there is mucus that is stuck down in his chest. then got a flu shot and got a cold from there.. patient states being sick 6 times      HISTORY OF PRESENT ILLNESS: Acute problem visit today. This is a 67 y.o. male complaining of flulike symptoms that started 2 days ago. Recently had upper respiratory viral infection. States he has been sick with similar symptoms 6 times in the past 5 months. Mostly complaining of cough and chest tightness. No other complaints or medical concerns today Normal recent chest x-ray reviewed with patient   Cough Associated symptoms include wheezing. Pertinent negatives include no chest pain, chills, fever, headaches or rash.     Prior to Admission medications   Medication Sig Start Date End Date Taking? Authorizing Provider  acetaminophen (TYLENOL) 500 MG tablet Take 1,000 mg by mouth every 8 (eight) hours as needed for moderate pain.   Yes [provider]  ascorbic acid (VITAMIN C) 500 MG tablet Take 500 mg by mouth daily.   Yes [provider]  aspirin EC 81 MG tablet Take 1 tablet (81 mg total) by mouth daily. Swallow whole. 03/15/23  Yes Corky Crafts, MD  atorvastatin (LIPITOR) 40 MG tablet Take 1 tablet by mouth once daily 05/10/23  Yes Alver Sorrow, NP  empagliflozin (JARDIANCE) 25 MG TABS tablet Take 1 tablet (25 mg total) by mouth daily before breakfast. 03/09/23  Yes Carlus Pavlov, MD  ferrous sulfate 325 (65 FE) MG EC tablet Take 325 mg by mouth daily.   Yes [provider]  losartan (COZAAR) 50 MG tablet Take 1 tablet by mouth once daily 06/04/23  Yes Dent Plantz, Eilleen Kempf, MD  Magnesium Glycinate 665 MG CAPS Take 1 tablet by mouth in the morning and at bedtime.   Yes [provider]  metFORMIN (GLUCOPHAGE) 1000 MG tablet Take 1 tablet (1,000 mg total) by mouth 2 (two) times daily with a meal. 11/24/22  Yes Carlus Pavlov, MD  methocarbamol (ROBAXIN-750) 750 MG tablet Take 1 tablet (750 mg total) by mouth every 8 (eight) hours as needed for muscle spasms. 06/08/22  Yes Jene Every, MD  omeprazole (PRILOSEC) 20 MG capsule Take 1 capsule (20 mg total) by mouth daily. 02/13/23  Yes Kennedy-Smith, Malachi Carl, NP  Semaglutide, 2 MG/DOSE, (OZEMPIC, 2 MG/DOSE,) 8 MG/3ML SOPN Inject 2 mg into the skin once a week. 10/19/22  Yes Carlus Pavlov, MD  benzonatate (TESSALON) 200 MG capsule Take 1 capsule (200 mg total) by mouth 2 (two) times daily as needed for cough. Patient not taking: Reported on 06/19/2023 05/29/23   Georgina Quint, MD  HYDROcodone bit-homatropine [facility]) 5-1.5 MG/5ML syrup Take 5 mLs by mouth at bedtime as needed for cough. Patient not taking: Reported on 06/19/2023 06/05/23   Georgina Quint, MD    No Known Allergies  Patient Active Problem List   Diagnosis Date Noted   Spinal stenosis at L4-L5 level 06/08/2022   Lumbar spondylosis 05/21/2022   Lumbar radiculopathy 08/05/2021   Degeneration of thoracic intervertebral disc 06/29/2020   Pain in thoracic spine 06/29/2020   Decreased calculated GFR 08/08/2017   Bilateral adhesive capsulitis of shoulders 07/26/2017   Acute cough 07/25/2017   Viral upper respiratory tract  infection 07/25/2017   Erectile dysfunction 09/15/2013   Nonspecific abnormal electrocardiogram (ECG) (EKG) 06/10/2013   Poorly controlled type 2 diabetes mellitus with circulatory disorder (HCC) 09/12/2012   Coronary artery disease 07/14/2011   History of coronary artery bypass surgery 07/14/2011   Dyslipidemia associated with type 2 diabetes mellitus (HCC) 02/23/2009   Hyperlipidemia 02/23/2009   Hypertension associated with diabetes (HCC) 02/23/2009    Past Medical History:  Diagnosis Date   Anemia     Arthritis    Cancer (HCC)    skin cancer on left posterior neck - excised   Cataract    Coronary artery disease    post CABG x3 in 2007   Diabetes mellitus    type 2   GERD (gastroesophageal reflux disease)    Headache    no current problem   Hearing loss    some hearing loss bilateral - no hearing aids   History of kidney stones 1990   passed stone   Hyperlipidemia    Hypertension    Obesity     Past Surgical History:  Procedure Laterality Date   CARDIAC CATHETERIZATION  EF= 45-50%   EF= 45-50% -- Three-vessel coronary artery disease with high-grade lesions in the proximal left anterior descending artery and multiple lesions in the right coronary -- Low normal ejection fraction -- Stable exertional angina -- Arvilla Meres, M.D. [facility]   COLONOSCOPY  09/26/2021   CORONARY ARTERY BYPASS GRAFT  02/19/2006    x3 (left internal mammary artery to LAD, left radial artery to posterior descending, saphenous vein graft to first diagonal), endoscopic vein harvest right leg -- SURGEON:  Salvatore Decent. Dorris Fetch, M.D.   HEMI-MICRODISCECTOMY LUMBAR LAMINECTOMY LEVEL 1 Left 06/08/2022   Procedure: Micro hemi laminectomy Lumbar Five-Sacral One left;  Surgeon: Jene Every, MD;  Location: [facility] OR;  Service: Orthopedics;  Laterality: Left;  90 mins 3 C-Bed   HERNIA REPAIR     at 67 y/o   SHOULDER ARTHROSCOPY Right 2007   tripple bypass     UPPER GASTROINTESTINAL ENDOSCOPY  09/26/2021    Social History   Socioeconomic History   Marital status: Married    Spouse name: Not on file   Number of children: 3   Years of education: Not on file   Highest education level: Bachelor's degree (e.g., BA, AB, BS)  Occupational History   Occupation: retired  Tobacco Use   Smoking status: Never   Smokeless tobacco: Never  Vaping Use   Vaping status: Never Used  Substance and Sexual Activity   Alcohol use: Yes    Alcohol/week: 1.0 standard drink of alcohol    Types: 1 Standard drinks or equivalent  per week    Comment: 1 drink monthly   Drug use: Never   Sexual activity: Yes  Other Topics Concern   Not on file  Social History Narrative   Not on file   Social Determinants of Health   Financial Resource Strain: Low Risk  (05/28/2023)   Overall Financial Resource Strain (CARDIA)    Difficulty of Paying Living Expenses: Not hard at all  Food Insecurity: No Food Insecurity (05/28/2023)   Hunger Vital Sign    Worried About Running Out of Food in the Last Year: Never true    Ran Out of Food in the Last Year: Never true  Transportation Needs: No Transportation Needs (05/28/2023)   PRAPARE - Administrator, Civil Service (Medical): No    Lack of Transportation (Non-Medical): No  Physical Activity: Unknown (  05/28/2023)   Exercise Vital Sign    Days of Exercise per Week: 2 days    Minutes of Exercise per Session: Patient declined  Stress: No Stress Concern Present (05/28/2023)   [employer] of Occupational Health - Occupational Stress Questionnaire    Feeling of Stress : Not at all  Social Connections: Moderately Integrated (05/28/2023)   Social Connection and Isolation Panel [NHANES]    Frequency of Communication with Friends and Family: Three times a week    Frequency of Social Gatherings with Friends and Family: Twice a week    Attends Religious Services: Never    Database administrator or Organizations: Yes    Attends Engineer, structural: More than 4 times per year    Marital Status: Married  Catering manager Violence: Not on file    Family History  Problem Relation Age of Onset   Heart disease Mother    Colon cancer Mother        12   Hypertension Mother    Cancer Father        type unknown   Kidney disease Maternal Aunt    Diabetes Maternal Uncle    Heart attack Maternal Grandfather 47   Colon polyps Neg Hx    Esophageal cancer Neg Hx    Rectal cancer Neg Hx    Stomach cancer Neg Hx      Review of Systems  Constitutional: Negative.   Negative for chills and fever.  HENT:  Positive for congestion.   Respiratory:  Positive for cough and wheezing.   Cardiovascular: Negative.  Negative for chest pain and palpitations.  Gastrointestinal:  Negative for abdominal pain, diarrhea, nausea and vomiting.  Genitourinary: Negative.  Negative for dysuria and hematuria.  Skin: Negative.  Negative for rash.  Neurological: Negative.  Negative for dizziness and headaches.  All other systems reviewed and are negative.   Vitals:   06/19/23 1518  BP: (!) 110/58  Pulse: 89  Temp: 97.9 F (36.6 C)  SpO2: 96%    Physical Exam Vitals reviewed.  Constitutional:      Appearance: Normal appearance.  HENT:     Head: Normocephalic.     Mouth/Throat:     Mouth: Mucous membranes are moist.     Pharynx: Oropharynx is clear.  Eyes:     Extraocular Movements: Extraocular movements intact.     Pupils: Pupils are equal, round, and reactive to light.  Cardiovascular:     Rate and Rhythm: Normal rate and regular rhythm.     Pulses: Normal pulses.     Heart sounds: Normal heart sounds.  Pulmonary:     Effort: Pulmonary effort is normal.     Breath sounds: Normal breath sounds.  Musculoskeletal:     Cervical back: No tenderness.  Lymphadenopathy:     Cervical: No cervical adenopathy.  Skin:    General: Skin is warm and dry.     Capillary Refill: Capillary refill takes less than 2 seconds.  Neurological:     General: No focal deficit present.     Mental Status: He is alert and oriented to person, place, and time.  Psychiatric:        Mood and Affect: Mood normal.        Behavior: Behavior normal.      ASSESSMENT & PLAN: A total of 33 minutes was spent with the patient and counseling/coordination of care regarding preparing for this visit, review of most recent office visit notes, review of multiple chronic  medical conditions and their management, review of all medications, review of most recent bloodwork results and most recent  chest x-ray image and report, diagnosis of lower respiratory infection and need for antibiotics, cough management, prognosis, documentation, and need for follow up.  Problem List Items Addressed This Visit       Respiratory   Lower respiratory infection - Primary    Clinically stable.  No signs of pneumonia. Recommend daily azithromycin for 5 days Advised to rest and stay well-hydrated May benefit from albuterol twice a day for 5 days       Relevant Medications   azithromycin (ZITHROMAX) 250 MG tablet   albuterol (VENTOLIN HFA) 108 (90 Base) MCG/ACT inhaler     Other   Acute cough    Cough management discussed Recommend over-the-counter Mucinex and cough drops Start Tessalon 200 mg 3 times a day and Hycodan syrup at bedtime Advised to rest and stay well-hydrated Has intermittent wheezing.  Will benefit from albuterol twice a day for 5 days      Relevant Medications   azithromycin (ZITHROMAX) 250 MG tablet   benzonatate (TESSALON) 200 MG capsule   HYDROcodone bit-homatropine (HYCODAN) 5-1.5 MG/5ML syrup   albuterol (VENTOLIN HFA) 108 (90 Base) MCG/ACT inhaler   Patient Instructions  Acute Bronchitis, Adult  Acute bronchitis is when air tubes in the lungs (bronchi) suddenly get swollen. The condition can make it hard for you to breathe. In adults, acute bronchitis usually goes away within 2 weeks. A cough caused by bronchitis may last up to 3 weeks. Smoking, allergies, and asthma can make the condition worse. What are the causes? Germs that cause cold and flu (viruses). The most common cause of this condition is the virus that causes the common cold. Bacteria. Substances that bother (irritate) the lungs, including: Smoke from cigarettes and other types of tobacco. Dust and pollen. Fumes from chemicals, gases, or burned fuel. Indoor or outdoor air pollution. What increases the risk? A weak body's defense system. This is also called the immune system. Any condition that  affects your lungs and breathing, such as asthma. What are the signs or symptoms? A cough. Coughing up clear, yellow, or green mucus. Making high-pitched whistling sounds when you breathe, most often when you breathe out (wheezing). Runny or stuffy nose. Having too much mucus in your lungs (chest congestion). Shortness of breath. Body aches. A sore throat. How is this treated? Acute bronchitis may go away over time without treatment. Your doctor may tell you to: Drink more fluids. This will help thin your mucus so it is easier to cough up. Use a device that gets medicine into your lungs (inhaler). Use a vaporizer or a humidifier. These are machines that add water to the air. This helps with coughing and poor breathing. Take a medicine that thins mucus and helps clear it from your lungs. Take a medicine that prevents or stops coughing. It is not common to take an antibiotic medicine for this condition. Follow these instructions at home:  Take over-the-counter and prescription medicines only as told by your doctor. Use an inhaler, vaporizer, or humidifier as told by your doctor. Take two teaspoons (10 mL) of honey at bedtime. This helps lessen your coughing at night. Drink enough fluid to keep your pee (urine) pale yellow. Do not smoke or use any products that contain nicotine or tobacco. If you need help quitting, ask your doctor. Get a lot of rest. Return to your normal activities when your doctor says that it  is safe. Keep all follow-up visits. How is this prevented?  Wash your hands often with soap and water for at least 20 seconds. If you cannot use soap and water, use hand sanitizer. Avoid contact with people who have cold symptoms. Try not to touch your mouth, nose, or eyes with your hands. Avoid breathing in smoke or chemical fumes. Make sure to get the flu shot every year. Contact a doctor if: Your symptoms do not get better in 2 weeks. You have trouble coughing up the  mucus. Your cough keeps you awake at night. You have a fever. Get help right away if: You cough up blood. You have chest pain. You have very bad shortness of breath. You faint or keep feeling like you are going to faint. You have a very bad headache. Your fever or chills get worse. These symptoms may be an emergency. Get help right away. Call your local emergency services (911 in the [place] Do not wait to see if the symptoms will go away. Do not drive yourself to the hospital. Summary Acute bronchitis is when air tubes in the lungs (bronchi) suddenly get swollen. In adults, acute bronchitis usually goes away within 2 weeks. Drink more fluids. This will help thin your mucus so it is easier to cough up. Take over-the-counter and prescription medicines only as told by your doctor. Contact a doctor if your symptoms do not improve after 2 weeks of treatment. This information is not intended to replace advice given to you by your health care provider. Make sure you discuss any questions you have with your health care provider. Document Revised: 11/10/2020 Document Reviewed: 11/10/2020 Elsevier Patient Education  2024 Elsevier Inc.    Edwina Barth, MD [facility]

## 2023-07-04 ENCOUNTER — Encounter: Payer: Self-pay | Admitting: Emergency Medicine

## 2023-07-04 NOTE — Telephone Encounter (Signed)
Thank you :)

## 2023-07-09 ENCOUNTER — Ambulatory Visit (INDEPENDENT_AMBULATORY_CARE_PROVIDER_SITE_OTHER): Payer: Medicare PPO | Admitting: Emergency Medicine

## 2023-07-09 ENCOUNTER — Encounter: Payer: Self-pay | Admitting: Emergency Medicine

## 2023-07-09 VITALS — BP 124/68 | HR 82 | Temp 97.7°F | Ht 70.0 in | Wt 198.6 lb

## 2023-07-09 DIAGNOSIS — Z7984 Long term (current) use of oral hypoglycemic drugs: Secondary | ICD-10-CM

## 2023-07-09 DIAGNOSIS — R053 Chronic cough: Secondary | ICD-10-CM | POA: Diagnosis not present

## 2023-07-09 DIAGNOSIS — I152 Hypertension secondary to endocrine disorders: Secondary | ICD-10-CM | POA: Diagnosis not present

## 2023-07-09 DIAGNOSIS — Z0001 Encounter for general adult medical examination with abnormal findings: Secondary | ICD-10-CM

## 2023-07-09 DIAGNOSIS — Z7985 Long-term (current) use of injectable non-insulin antidiabetic drugs: Secondary | ICD-10-CM | POA: Diagnosis not present

## 2023-07-09 DIAGNOSIS — E1159 Type 2 diabetes mellitus with other circulatory complications: Secondary | ICD-10-CM | POA: Diagnosis not present

## 2023-07-09 DIAGNOSIS — E785 Hyperlipidemia, unspecified: Secondary | ICD-10-CM | POA: Diagnosis not present

## 2023-07-09 DIAGNOSIS — N529 Male erectile dysfunction, unspecified: Secondary | ICD-10-CM | POA: Diagnosis not present

## 2023-07-09 DIAGNOSIS — E1169 Type 2 diabetes mellitus with other specified complication: Secondary | ICD-10-CM

## 2023-07-09 LAB — MICROALBUMIN / CREATININE URINE RATIO
Creatinine,U: 46.2 mg/dL
Microalb Creat Ratio: 6.4 mg/g (ref 0.0–30.0)
Microalb, Ur: 3 mg/dL — ABNORMAL HIGH (ref 0.0–1.9)

## 2023-07-09 NOTE — Patient Instructions (Signed)
Health Maintenance, Male Adopting a healthy lifestyle and getting preventive care are important in promoting health and wellness. Ask your health care provider about: The right schedule for you to have regular tests and exams. Things you can do on your own to prevent diseases and keep yourself healthy. What should I know about diet, weight, and exercise? Eat a healthy diet  Eat a diet that includes plenty of vegetables, fruits, low-fat dairy products, and lean protein. Do not eat a lot of foods that are high in solid fats, added sugars, or sodium. Maintain a healthy weight Body mass index (BMI) is a measurement that can be used to identify possible weight problems. It estimates body fat based on height and weight. Your health care provider can help determine your BMI and help you achieve or maintain a healthy weight. Get regular exercise Get regular exercise. This is one of the most important things you can do for your health. Most adults should: Exercise for at least 150 minutes each week. The exercise should increase your heart rate and make you sweat (moderate-intensity exercise). Do strengthening exercises at least twice a week. This is in addition to the moderate-intensity exercise. Spend less time sitting. Even light physical activity can be beneficial. Watch cholesterol and blood lipids Have your blood tested for lipids and cholesterol at 67 years of age, then have this test every 5 years. You may need to have your cholesterol levels checked more often if: Your lipid or cholesterol levels are high. You are older than 67 years of age. You are at high risk for heart disease. What should I know about cancer screening? Many types of cancers can be detected early and may often be prevented. Depending on your health history and family history, you may need to have cancer screening at various ages. This may include screening for: Colorectal cancer. Prostate cancer. Skin cancer. Lung  cancer. What should I know about heart disease, diabetes, and high blood pressure? Blood pressure and heart disease High blood pressure causes heart disease and increases the risk of stroke. This is more likely to develop in people who have high blood pressure readings or are overweight. Talk with your health care provider about your target blood pressure readings. Have your blood pressure checked: Every 3-5 years if you are 18-39 years of age. Every year if you are 40 years old or older. If you are between the ages of 65 and 75 and are a current or former smoker, ask your health care provider if you should have a one-time screening for abdominal aortic aneurysm (AAA). Diabetes Have regular diabetes screenings. This checks your fasting blood sugar level. Have the screening done: Once every three years after age 45 if you are at a normal weight and have a low risk for diabetes. More often and at a younger age if you are overweight or have a high risk for diabetes. What should I know about preventing infection? Hepatitis B If you have a higher risk for hepatitis B, you should be screened for this virus. Talk with your health care provider to find out if you are at risk for hepatitis B infection. Hepatitis C Blood testing is recommended for: Everyone born from 1945 through 1965. Anyone with known risk factors for hepatitis C. Sexually transmitted infections (STIs) You should be screened each year for STIs, including gonorrhea and chlamydia, if: You are sexually active and are younger than 67 years of age. You are older than 67 years of age and your   health care provider tells you that you are at risk for this type of infection. Your sexual activity has changed since you were last screened, and you are at increased risk for chlamydia or gonorrhea. Ask your health care provider if you are at risk. Ask your health care provider about whether you are at high risk for HIV. Your health care provider  may recommend a prescription medicine to help prevent HIV infection. If you choose to take medicine to prevent HIV, you should first get tested for HIV. You should then be tested every 3 months for as long as you are taking the medicine. Follow these instructions at home: Alcohol use Do not drink alcohol if your health care provider tells you not to drink. If you drink alcohol: Limit how much you have to 0-2 drinks a day. Know how much alcohol is in your drink. In the [place] one drink equals one 12 oz bottle of beer (355 mL), one 5 oz glass of wine (148 mL), or one 1 oz glass of hard liquor (44 mL). Lifestyle Do not use any products that contain nicotine or tobacco. These products include cigarettes, chewing tobacco, and vaping devices, such as e-cigarettes. If you need help quitting, ask your health care provider. Do not use street drugs. Do not share needles. Ask your health care provider for help if you need support or information about quitting drugs. General instructions Schedule regular health, dental, and eye exams. Stay current with your vaccines. Tell your health care provider if: You often feel depressed. You have ever been abused or do not feel safe at home. Summary Adopting a healthy lifestyle and getting preventive care are important in promoting health and wellness. Follow your health care provider's instructions about healthy diet, exercising, and getting tested or screened for diseases. Follow your health care provider's instructions on monitoring your cholesterol and blood pressure. This information is not intended to replace advice given to you by your health care provider. Make sure you discuss any questions you have with your health care provider. Document Revised: 11/29/2020 Document Reviewed: 11/29/2020 Elsevier Patient Education  2024 Elsevier Inc.  

## 2023-07-09 NOTE — Assessment & Plan Note (Signed)
Well-controlled hypertension Continue losartan 50 mg daily      Lab Results  Component Value Date    HGBA1C 6.8 (A) 10/19/2022  Well-controlled diabetes Continue weekly Ozempic 2 mg along with daily metformin 1000 mg twice a day and Jardiance 25 mg daily

## 2023-07-09 NOTE — Assessment & Plan Note (Signed)
Active and affecting quality life Oral medications not working Has seen neurologist in the past.  Will follow-up with them and explore other options.

## 2023-07-09 NOTE — Progress Notes (Signed)
[name] Love 67 y.o.   Chief Complaint  Patient presents with   Annual Exam    Patient c/o of still coughing     HISTORY OF PRESENT ILLNESS: This is a 67 y.o. male here for annual physical exam Diabetic and hypertensive. Complaining of persistent cough for the past several months. Non-smoker.  No history of COPD. No other complaints or medical concerns today.  Lab Results  Component Value Date   HGBA1C 6.9 (H) 05/29/2023   Wt Readings from Last 3 Encounters:  07/09/23 198 lb 9.6 oz (90.1 kg)  06/19/23 194 lb 6.4 oz (88.2 kg)  05/29/23 197 lb (89.4 kg)   BP Readings from Last 3 Encounters:  07/09/23 124/68  06/19/23 (!) 110/58  05/29/23 138/64      HPI   Prior to Admission medications   Medication Sig Start Date End Date Taking? Authorizing Provider  acetaminophen (TYLENOL) 500 MG tablet Take 1,000 mg by mouth every 8 (eight) hours as needed for moderate pain.   Yes [provider]  albuterol (VENTOLIN HFA) 108 (90 Base) MCG/ACT inhaler Inhale 2 puffs into the lungs 2 (two) times daily. 06/19/23  Yes Melyna Huron, Eilleen Kempf, MD  ascorbic acid (VITAMIN C) 500 MG tablet Take 500 mg by mouth daily.   Yes [provider]  aspirin EC 81 MG tablet Take 1 tablet (81 mg total) by mouth daily. Swallow whole. 03/15/23  Yes Corky Crafts, MD  atorvastatin (LIPITOR) 40 MG tablet Take 1 tablet by mouth once daily 05/10/23  Yes Alver Sorrow, NP  empagliflozin (JARDIANCE) 25 MG TABS tablet Take 1 tablet (25 mg total) by mouth daily before breakfast. 03/09/23  Yes Carlus Pavlov, MD  ferrous sulfate 325 (65 FE) MG EC tablet Take 325 mg by mouth daily.   Yes [provider]  HYDROcodone bit-homatropine (HYCODAN) 5-1.5 MG/5ML syrup Take 5 mLs by mouth at bedtime as needed for cough. 06/19/23  Yes Eilene Voigt, Eilleen Kempf, MD  losartan (COZAAR) 50 MG tablet Take 1 tablet by mouth once daily 06/04/23  Yes Janisse Ghan, Eilleen Kempf, MD  Magnesium  Glycinate 665 MG CAPS Take 1 tablet by mouth in the morning and at bedtime.   Yes [provider]  metFORMIN (GLUCOPHAGE) 1000 MG tablet Take 1 tablet (1,000 mg total) by mouth 2 (two) times daily with a meal. 11/24/22  Yes Carlus Pavlov, MD  methocarbamol (ROBAXIN-750) 750 MG tablet Take 1 tablet (750 mg total) by mouth every 8 (eight) hours as needed for muscle spasms. 06/08/22  Yes Jene Every, MD  omeprazole (PRILOSEC) 20 MG capsule Take 1 capsule (20 mg total) by mouth daily. 02/13/23  Yes Kennedy-Smith, Malachi Carl, NP  Semaglutide, 2 MG/DOSE, (OZEMPIC, 2 MG/DOSE,) 8 MG/3ML SOPN Inject 2 mg into the skin once a week. 10/19/22  Yes Carlus Pavlov, MD  azithromycin (ZITHROMAX) 250 MG tablet Sig as indicated Patient not taking: Reported on 07/09/2023 06/19/23   Georgina Quint, MD  benzonatate (TESSALON) 200 MG capsule Take 1 capsule (200 mg total) by mouth 2 (two) times daily as needed for cough. Patient not taking: Reported on 07/09/2023 06/19/23   Georgina Quint, MD    No Known Allergies  Patient Active Problem List   Diagnosis Date Noted   Lower respiratory infection 06/19/2023   Spinal stenosis at L4-L5 level 06/08/2022   Lumbar spondylosis 05/21/2022   Lumbar radiculopathy 08/05/2021   Degeneration of thoracic intervertebral disc 06/29/2020   Pain in thoracic spine 06/29/2020   Decreased  calculated GFR 08/08/2017   Bilateral adhesive capsulitis of shoulders 07/26/2017   Acute cough 07/25/2017   Viral upper respiratory tract infection 07/25/2017   Erectile dysfunction 09/15/2013   Nonspecific abnormal electrocardiogram (ECG) (EKG) 06/10/2013   Poorly controlled type 2 diabetes mellitus with circulatory disorder (HCC) 09/12/2012   Coronary artery disease 07/14/2011   History of coronary artery bypass surgery 07/14/2011   Dyslipidemia associated with type 2 diabetes mellitus (HCC) 02/23/2009   Hyperlipidemia 02/23/2009   Hypertension associated with  diabetes (HCC) 02/23/2009    Past Medical History:  Diagnosis Date   Anemia    Arthritis    Cancer (HCC)    skin cancer on left posterior neck - excised   Cataract    Coronary artery disease    post CABG x3 in 2007   Diabetes mellitus    type 2   GERD (gastroesophageal reflux disease)    Headache    no current problem   Hearing loss    some hearing loss bilateral - no hearing aids   History of kidney stones 1990   passed stone   Hyperlipidemia    Hypertension    Obesity     Past Surgical History:  Procedure Laterality Date   CARDIAC CATHETERIZATION  EF= 45-50%   EF= 45-50% -- Three-vessel coronary artery disease with high-grade lesions in the proximal left anterior descending artery and multiple lesions in the right coronary -- Low normal ejection fraction -- Stable exertional angina -- Arvilla Meres, M.D. [facility]   COLONOSCOPY  09/26/2021   CORONARY ARTERY BYPASS GRAFT  02/19/2006    x3 (left internal mammary artery to LAD, left radial artery to posterior descending, saphenous vein graft to first diagonal), endoscopic vein harvest right leg -- SURGEON:  Salvatore Decent. Dorris Fetch, M.D.   HEMI-MICRODISCECTOMY LUMBAR LAMINECTOMY LEVEL 1 Left 06/08/2022   Procedure: Micro hemi laminectomy Lumbar Five-Sacral One left;  Surgeon: Jene Every, MD;  Location: [facility] OR;  Service: Orthopedics;  Laterality: Left;  90 mins 3 C-Bed   HERNIA REPAIR     at 67 y/o   SHOULDER ARTHROSCOPY Right 2007   tripple bypass     UPPER GASTROINTESTINAL ENDOSCOPY  09/26/2021    Social History   Socioeconomic History   Marital status: Married    Spouse name: Not on file   Number of children: 3   Years of education: Not on file   Highest education level: Bachelor's degree (e.g., BA, AB, BS)  Occupational History   Occupation: retired  Tobacco Use   Smoking status: Never   Smokeless tobacco: Never  Vaping Use   Vaping status: Never Used  Substance and Sexual Activity   Alcohol use: Yes     Alcohol/week: 1.0 standard drink of alcohol    Types: 1 Standard drinks or equivalent per week    Comment: 1 drink monthly   Drug use: Never   Sexual activity: Yes  Other Topics Concern   Not on file  Social History Narrative   Not on file   Social Drivers of Health   Financial Resource Strain: Low Risk  (07/07/2023)   Overall Financial Resource Strain (CARDIA)    Difficulty of Paying Living Expenses: Not hard at all  Food Insecurity: No Food Insecurity (07/07/2023)   Hunger Vital Sign    Worried About Running Out of Food in the Last Year: Never true    Ran Out of Food in the Last Year: Never true  Transportation Needs: No Transportation Needs (07/07/2023)   PRAPARE -  Administrator, Civil Service (Medical): No    Lack of Transportation (Non-Medical): No  Physical Activity: Sufficiently Active (07/07/2023)   Exercise Vital Sign    Days of Exercise per Week: 2 days    Minutes of Exercise per Session: 100 min  Stress: No Stress Concern Present (07/07/2023)   [employer] of Occupational Health - Occupational Stress Questionnaire    Feeling of Stress : Not at all  Social Connections: Unknown (07/07/2023)   Social Connection and Isolation Panel [NHANES]    Frequency of Communication with Friends and Family: Three times a week    Frequency of Social Gatherings with Friends and Family: Twice a week    Attends Religious Services: Patient declined    Database administrator or Organizations: Yes    Attends Engineer, structural: More than 4 times per year    Marital Status: Married  Catering manager Violence: Not on file    Family History  Problem Relation Age of Onset   Heart disease Mother    Colon cancer Mother        52   Hypertension Mother    Cancer Father        type unknown   Kidney disease Maternal Aunt    Diabetes Maternal Uncle    Heart attack Maternal Grandfather 89   Colon polyps Neg Hx    Esophageal cancer Neg Hx    Rectal cancer Neg  Hx    Stomach cancer Neg Hx      Review of Systems  Constitutional: Negative.  Negative for chills and fever.  HENT: Negative.  Negative for congestion and sore throat.   Respiratory: Negative.  Negative for cough and shortness of breath.   Cardiovascular: Negative.  Negative for chest pain and palpitations.  Gastrointestinal:  Negative for abdominal pain, diarrhea, nausea and vomiting.  Genitourinary: Negative.  Negative for dysuria and hematuria.  Skin: Negative.  Negative for rash.  Neurological: Negative.  Negative for dizziness and headaches.  All other systems reviewed and are negative.   Today's Vitals   07/09/23 1334  BP: 124/68  Pulse: 82  Temp: 97.7 F (36.5 C)  TempSrc: Oral  SpO2: 95%  Weight: 198 lb 9.6 oz (90.1 kg)  Height: 5\' 10"  (1.778 m)   Body mass index is 28.5 kg/m.   Physical Exam Vitals reviewed.  Constitutional:      Appearance: Normal appearance.  HENT:     Head: Normocephalic.     Right Ear: Tympanic membrane, ear canal and external ear normal.     Left Ear: Tympanic membrane, ear canal and external ear normal.     Mouth/Throat:     Mouth: Mucous membranes are moist.     Pharynx: Oropharynx is clear.  Eyes:     Extraocular Movements: Extraocular movements intact.     Conjunctiva/sclera: Conjunctivae normal.     Pupils: Pupils are equal, round, and reactive to light.  Cardiovascular:     Rate and Rhythm: Normal rate and regular rhythm.     Pulses: Normal pulses.     Heart sounds: Normal heart sounds.  Pulmonary:     Effort: Pulmonary effort is normal.     Breath sounds: Normal breath sounds.  Abdominal:     Palpations: Abdomen is soft.     Tenderness: There is no abdominal tenderness.  Musculoskeletal:     Cervical back: No tenderness.     Right lower leg: No edema.     Left lower  leg: No edema.  Lymphadenopathy:     Cervical: No cervical adenopathy.  Skin:    General: Skin is warm and dry.     Capillary Refill: Capillary refill  takes less than 2 seconds.  Neurological:     General: No focal deficit present.     Mental Status: He is alert and oriented to person, place, and time.  Psychiatric:        Mood and Affect: Mood normal.        Behavior: Behavior normal.      ASSESSMENT & PLAN: Problem List Items Addressed This Visit       Cardiovascular and Mediastinum   Hypertension associated with diabetes (HCC)   Well-controlled hypertension Continue losartan 50 mg daily      Lab Results  Component Value Date    HGBA1C 6.8 (A) 10/19/2022  Well-controlled diabetes Continue weekly Ozempic 2 mg along with daily metformin 1000 mg twice a day and Jardiance 25 mg daily      Relevant Orders   Urine Microalbumin w/creat. ratio     Endocrine   Dyslipidemia associated with type 2 diabetes mellitus (HCC)   Stable chronic conditions Continue Jardiance, metformin, and Ozempic as detailed above Continue atorvastatin 40 mg daily      Relevant Orders   Urine Microalbumin w/creat. ratio     Other   Erectile dysfunction   Active and affecting quality life Oral medications not working Has seen neurologist in the past.  Will follow-up with them and explore other options.      Persistent cough   Active and affecting quality of life. Advised to continue over-the-counter Mucinex DM Recent chest x-ray report from 05/29/2023 reviewed with patient Unremarkable findings. Recommend pulmonary evaluation and possible PFTs.      Relevant Orders   Ambulatory referral to Pulmonology   Other Visit Diagnoses       Encounter for general adult medical examination with abnormal findings    -  Primary      Modifiable risk factors discussed with patient. Anticipatory guidance according to age provided. The following topics were also discussed: Social Determinants of Health Smoking.  Non-smoker Diet and nutrition Benefits of exercise Cancer screening and review of most recent colonoscopy report Vaccinations review  and recommendations Cardiovascular risk assessment Mental health including depression and anxiety Fall and accident prevention  Patient Instructions  Health Maintenance, Male Adopting a healthy lifestyle and getting preventive care are important in promoting health and wellness. Ask your health care provider about: The right schedule for you to have regular tests and exams. Things you can do on your own to prevent diseases and keep yourself healthy. What should I know about diet, weight, and exercise? Eat a healthy diet  Eat a diet that includes plenty of vegetables, fruits, low-fat dairy products, and lean protein. Do not eat a lot of foods that are high in solid fats, added sugars, or sodium. Maintain a healthy weight Body mass index (BMI) is a measurement that can be used to identify possible weight problems. It estimates body fat based on height and weight. Your health care provider can help determine your BMI and help you achieve or maintain a healthy weight. Get regular exercise Get regular exercise. This is one of the most important things you can do for your health. Most adults should: Exercise for at least 150 minutes each week. The exercise should increase your heart rate and make you sweat (moderate-intensity exercise). Do strengthening exercises at least twice a week.  This is in addition to the moderate-intensity exercise. Spend less time sitting. Even light physical activity can be beneficial. Watch cholesterol and blood lipids Have your blood tested for lipids and cholesterol at 67 years of age, then have this test every 5 years. You may need to have your cholesterol levels checked more often if: Your lipid or cholesterol levels are high. You are older than 67 years of age. You are at high risk for heart disease. What should I know about cancer screening? Many types of cancers can be detected early and may often be prevented. Depending on your health history and family  history, you may need to have cancer screening at various ages. This may include screening for: Colorectal cancer. Prostate cancer. Skin cancer. Lung cancer. What should I know about heart disease, diabetes, and high blood pressure? Blood pressure and heart disease High blood pressure causes heart disease and increases the risk of stroke. This is more likely to develop in people who have high blood pressure readings or are overweight. Talk with your health care provider about your target blood pressure readings. Have your blood pressure checked: Every 3-5 years if you are 76-26 years of age. Every year if you are 76 years old or older. If you are between the ages of 77 and 89 and are a current or former smoker, ask your health care provider if you should have a one-time screening for abdominal aortic aneurysm (AAA). Diabetes Have regular diabetes screenings. This checks your fasting blood sugar level. Have the screening done: Once every three years after age 18 if you are at a normal weight and have a low risk for diabetes. More often and at a younger age if you are overweight or have a high risk for diabetes. What should I know about preventing infection? Hepatitis B If you have a higher risk for hepatitis B, you should be screened for this virus. Talk with your health care provider to find out if you are at risk for hepatitis B infection. Hepatitis C Blood testing is recommended for: Everyone born from [birth year] through 1965. Anyone with known risk factors for hepatitis C. Sexually transmitted infections (STIs) You should be screened each year for STIs, including gonorrhea and chlamydia, if: You are sexually active and are younger than 67 years of age. You are older than 67 years of age and your health care provider tells you that you are at risk for this type of infection. Your sexual activity has changed since you were last screened, and you are at increased risk for chlamydia or  gonorrhea. Ask your health care provider if you are at risk. Ask your health care provider about whether you are at high risk for HIV. Your health care provider may recommend a prescription medicine to help prevent HIV infection. If you choose to take medicine to prevent HIV, you should first get tested for HIV. You should then be tested every 3 months for as long as you are taking the medicine. Follow these instructions at home: Alcohol use Do not drink alcohol if your health care provider tells you not to drink. If you drink alcohol: Limit how much you have to 0-2 drinks a day. Know how much alcohol is in your drink. In the [place] one drink equals one 12 oz bottle of beer (355 mL), one 5 oz glass of wine (148 mL), or one 1 oz glass of hard liquor (44 mL). Lifestyle Do not use any products that contain nicotine or tobacco. These  products include cigarettes, chewing tobacco, and vaping devices, such as e-cigarettes. If you need help quitting, ask your health care provider. Do not use street drugs. Do not share needles. Ask your health care provider for help if you need support or information about quitting drugs. General instructions Schedule regular health, dental, and eye exams. Stay current with your vaccines. Tell your health care provider if: You often feel depressed. You have ever been abused or do not feel safe at home. Summary Adopting a healthy lifestyle and getting preventive care are important in promoting health and wellness. Follow your health care provider's instructions about healthy diet, exercising, and getting tested or screened for diseases. Follow your health care provider's instructions on monitoring your cholesterol and blood pressure. This information is not intended to replace advice given to you by your health care provider. Make sure you discuss any questions you have with your health care provider. Document Revised: 11/29/2020 Document Reviewed: 11/29/2020 Elsevier  Patient Education  2024 Elsevier Inc.     Edwina Barth, MD [facility]

## 2023-07-09 NOTE — Assessment & Plan Note (Signed)
Active and affecting quality of life. Advised to continue over-the-counter Mucinex DM Recent chest x-ray report from 05/29/2023 reviewed with patient Unremarkable findings. Recommend pulmonary evaluation and possible PFTs.

## 2023-07-09 NOTE — Assessment & Plan Note (Signed)
 Stable chronic conditions Continue Jardiance, metformin, and Ozempic as detailed above Continue atorvastatin 40 mg daily

## 2023-07-25 DIAGNOSIS — E1159 Type 2 diabetes mellitus with other circulatory complications: Secondary | ICD-10-CM | POA: Diagnosis not present

## 2023-08-28 ENCOUNTER — Encounter: Payer: Self-pay | Admitting: Cardiovascular Disease

## 2023-08-28 ENCOUNTER — Ambulatory Visit: Payer: Medicare PPO | Admitting: Cardiovascular Disease

## 2023-08-28 ENCOUNTER — Ambulatory Visit: Payer: Medicare PPO | Attending: Cardiovascular Disease | Admitting: Cardiovascular Disease

## 2023-08-28 VITALS — BP 124/64 | HR 87 | Ht 70.0 in | Wt 195.8 lb

## 2023-08-28 DIAGNOSIS — E782 Mixed hyperlipidemia: Secondary | ICD-10-CM

## 2023-08-28 DIAGNOSIS — I25118 Atherosclerotic heart disease of native coronary artery with other forms of angina pectoris: Secondary | ICD-10-CM | POA: Diagnosis not present

## 2023-08-28 DIAGNOSIS — I10 Essential (primary) hypertension: Secondary | ICD-10-CM

## 2023-08-28 DIAGNOSIS — I152 Hypertension secondary to endocrine disorders: Secondary | ICD-10-CM | POA: Diagnosis not present

## 2023-08-28 DIAGNOSIS — E1159 Type 2 diabetes mellitus with other circulatory complications: Secondary | ICD-10-CM

## 2023-08-28 NOTE — Assessment & Plan Note (Signed)
History of essential hypertension blood pressure measured today at 124/64.  He is on losartan.

## 2023-08-28 NOTE — Progress Notes (Signed)
 08/28/2023 [facility]   Jan 08, 1956  [id number]  Primary Physician [name], Emil Schanz, MD Primary Cardiologist: Dorn JINNY Lesches MD [facility] [place], [place]  HPI:  [name] Love is a 68 y.o. mildly overweight married Caucasian male father of 3 children, grandfather of 1 grandchild who is retired from doing clinical biochemist.  He is transitioning his care from Dr. Dann to myself.  His risk factors include treated hypertension, diabetes and hyperlipidemia.  He never smoked.  There is no family history for heart disease.  He is never had a heart attack or stroke.  He had coronary artery bypass grafting 02/20/2006 and has been asymptomatic from this since.  He is fairly active, plays pickle ball, bowls and plays golf and denies chest pain or shortness of breath.   Current Meds  Medication Sig   acetaminophen  (TYLENOL ) 500 MG tablet Take 1,000 mg by mouth every 8 (eight) hours as needed for moderate pain.   aspirin  EC 81 MG tablet Take 1 tablet (81 mg total) by mouth daily. Swallow whole.   atorvastatin  (LIPITOR) 40 MG tablet Take 1 tablet by mouth once daily   empagliflozin  (JARDIANCE ) 25 MG TABS tablet Take 1 tablet (25 mg total) by mouth daily before breakfast.   losartan  (COZAAR ) 50 MG tablet Take 1 tablet by mouth once daily   Magnesium  Glycinate 665 MG CAPS Take 1 tablet by mouth in the morning and at bedtime.   metFORMIN  (GLUCOPHAGE ) 1000 MG tablet Take 1 tablet (1,000 mg total) by mouth 2 (two) times daily with a meal.   omeprazole  (PRILOSEC) 20 MG capsule Take 1 capsule (20 mg total) by mouth daily.   Semaglutide , 2 MG/DOSE, (OZEMPIC , 2 MG/DOSE,) 8 MG/3ML SOPN Inject 2 mg into the skin once a week.     No Known Allergies  Social History   Socioeconomic History   Marital status: Married    Spouse name: Not on file   Number of children: 3   Years of education: Not on file   Highest education level: Bachelor's degree (e.g., BA, AB, BS)  Occupational  History   Occupation: retired  Tobacco Use   Smoking status: Never   Smokeless tobacco: Never  Vaping Use   Vaping status: Never Used  Substance and Sexual Activity   Alcohol use: Yes    Alcohol/week: 1.0 standard drink of alcohol    Types: 1 Standard drinks or equivalent per week    Comment: 1 drink monthly   Drug use: Never   Sexual activity: Yes  Other Topics Concern   Not on file  Social History Narrative   Not on file   Social Drivers of Health   Financial Resource Strain: Low Risk  (07/07/2023)   Overall Financial Resource Strain (CARDIA)    Difficulty of Paying Living Expenses: Not hard at all  Food Insecurity: No Food Insecurity (07/07/2023)   Hunger Vital Sign    Worried About Running Out of Food in the Last Year: Never true    Ran Out of Food in the Last Year: Never true  Transportation Needs: No Transportation Needs (07/07/2023)   PRAPARE - Administrator, Civil Service (Medical): No    Lack of Transportation (Non-Medical): No  Physical Activity: Sufficiently Active (07/07/2023)   Exercise Vital Sign    Days of Exercise per Week: 2 days    Minutes of Exercise per Session: 100 min  Stress: No Stress Concern Present (07/07/2023)   [employer] of Occupational Health -  Occupational Stress Questionnaire    Feeling of Stress : Not at all  Social Connections: Unknown (07/07/2023)   Social Connection and Isolation Panel [NHANES]    Frequency of Communication with Friends and Family: Three times a week    Frequency of Social Gatherings with Friends and Family: Twice a week    Attends Religious Services: Patient declined    Database Administrator or Organizations: Yes    Attends Engineer, Structural: More than 4 times per year    Marital Status: Married  Catering Manager Violence: Not on file     Review of Systems: General: negative for chills, fever, night sweats or weight changes.  Cardiovascular: negative for chest pain, dyspnea on  exertion, edema, orthopnea, palpitations, paroxysmal nocturnal dyspnea or shortness of breath Dermatological: negative for rash Respiratory: negative for cough or wheezing Urologic: negative for hematuria Abdominal: negative for nausea, vomiting, diarrhea, bright red blood per rectum, melena, or hematemesis Neurologic: negative for visual changes, syncope, or dizziness All other systems reviewed and are otherwise negative except as noted above.    Blood pressure 124/64, pulse 87, height 5' 10 (1.778 m), weight 195 lb 12.8 oz (88.8 kg), SpO2 97%.  General appearance: alert and no distress Neck: no adenopathy, no carotid bruit, no JVD, supple, symmetrical, trachea midline, and thyroid  not enlarged, symmetric, no tenderness/mass/nodules Lungs: clear to auscultation bilaterally Heart: regular rate and rhythm, S1, S2 normal, no murmur, click, rub or gallop Extremities: extremities normal, atraumatic, no cyanosis or edema Pulses: 2+ and symmetric Skin: Skin color, texture, turgor normal. No rashes or lesions Neurologic: Grossly normal  EKG EKG Interpretation Date/Time:  Tuesday August 28 2023 13:44:34 EST Ventricular Rate:  87 PR Interval:  132 QRS Duration:  86 QT Interval:  366 QTC Calculation: 440 R Axis:   37  Text Interpretation: Normal sinus rhythm Nonspecific ST and T wave abnormality When compared with ECG of 20-Feb-2006 07:19, ST depression has replaced ST elevation in Anterior leads Nonspecific T wave abnormality now evident in Lateral leads Confirmed by Court Carrier [phone]) on 08/28/2023 1:46:24 PM    ASSESSMENT AND PLAN:   Hyperlipidemia History of hyperlipidemia on statin therapy with lipid profile performed 05/29/2023 revealing total cholesterol 86, LDL of 3 and HDL 36.  He did have an elevated triglyceride level of 234.  He apparently was on Vascepa  in the past without significant benefit.  Hypertension associated with diabetes (HCC) History of essential hypertension  blood pressure measured today at 124/64.  He is on losartan .  Coronary artery disease History of CAD status post coronary artery bypass grafting 02/20/2006.  He has been asymptomatic since.     Carrier DOROTHA Court MD FACP,FACC,FAHA, [facility] 08/28/2023 1:58 PM

## 2023-08-28 NOTE — Assessment & Plan Note (Signed)
 History of hyperlipidemia on statin therapy with lipid profile performed 05/29/2023 revealing total cholesterol 86, LDL of 3 and HDL 36.  He did have an elevated triglyceride level of 234.  He apparently was on Vascepa  in the past without significant benefit.

## 2023-08-28 NOTE — Assessment & Plan Note (Signed)
History of CAD status post coronary artery bypass grafting 02/20/2006.  He has been asymptomatic since.

## 2023-08-28 NOTE — Patient Instructions (Signed)

## 2023-09-10 ENCOUNTER — Ambulatory Visit: Payer: Medicare PPO | Admitting: Internal Medicine

## 2023-09-10 ENCOUNTER — Encounter: Payer: Self-pay | Admitting: Internal Medicine

## 2023-09-10 VITALS — BP 125/77 | HR 80 | Ht 70.0 in | Wt 203.0 lb

## 2023-09-10 DIAGNOSIS — R053 Chronic cough: Secondary | ICD-10-CM | POA: Diagnosis not present

## 2023-09-10 DIAGNOSIS — J4520 Mild intermittent asthma, uncomplicated: Secondary | ICD-10-CM

## 2023-09-10 MED ORDER — BUDESONIDE-FORMOTEROL FUMARATE 80-4.5 MCG/ACT IN AERO: 2.0000 | INHALATION_SPRAY | Freq: Two times a day (BID) | RESPIRATORY_TRACT | 3 refills | Status: DC

## 2023-09-10 MED ORDER — FLUTICASONE PROPIONATE 50 MCG/ACT NA SUSP: 1.0000 | Freq: Every day | NASAL | 5 refills | Status: DC

## 2023-09-10 NOTE — Progress Notes (Signed)
[name] Love    [id number]    10/28/1955  Primary Care Physician:[name], Eilleen Kempf, MD  Referring Physician: Georgina Quint, MD [address] [place],  [place] [address] Reason for Consultation: cough Date of Consultation: 09/10/2023  Chief complaint:   Chief Complaint  Patient presents with   Consult    Pt states he's had a bad cough, wheezing since 08/24     HPI: [name] Love is a 68 y.o. man with CAD s/p CABG who presents for new patient evaluation for coughing. He denies childhood asthma but it does take him longer to recover after URIs complicated by bronchitis.   He had covid in August 2024 and has had persistent symptoms of nasal congestion, drainage.  Cough is worse in the morning, he has nasal congestion and he has to blow his nose. Cough is usually dry. Feels overall cough is improving. He does cough everyday.   He tried an albuterol inhaler but that did not improve his cough. Has tried cough syrup over the counter as well as benzonatate and codeine syrup for night time. Also had a round of antibiotics with azithromycin.   Has not tried any nasal sprays.   Denies dyspnea, Denies wheezing, chest tightness. He plays pickle ball regularly.   Social history:  Occupation: he is retired from [employer]: lived in [place] most of his life. Lives at home with spouse.  Smoking history: never smoker, no passive smoke exposure in adulthood.   Social History   Occupational History   Occupation: retired  Tobacco Use   Smoking status: Never   Smokeless tobacco: Never  Vaping Use   Vaping status: Never Used  Substance and Sexual Activity   Alcohol use: Yes    Alcohol/week: 1.0 standard drink of alcohol    Types: 1 Standard drinks or equivalent per week    Comment: 1 drink monthly   Drug use: Never   Sexual activity: Yes    Relevant family history:  Family History  Problem Relation Age of Onset   Heart  disease Mother    Colon cancer Mother        51   Hypertension Mother    Cancer Father        type unknown   Cancer - Lung Father    Heart attack Maternal Grandfather 73   Kidney disease Maternal Aunt    Diabetes Maternal Uncle    Colon polyps Neg Hx    Esophageal cancer Neg Hx    Rectal cancer Neg Hx    Stomach cancer Neg Hx     Past Medical History:  Diagnosis Date   Anemia    Arthritis    Cancer (HCC)    skin cancer on left posterior neck - excised   Cataract    Coronary artery disease    post CABG x3 in 2007   Diabetes mellitus    type 2   GERD (gastroesophageal reflux disease)    Headache    no current problem   Hearing loss    some hearing loss bilateral - no hearing aids   History of kidney stones 1990   passed stone   Hyperlipidemia    Hypertension    Obesity     Past Surgical History:  Procedure Laterality Date   CARDIAC CATHETERIZATION  EF= 45-50%   EF= 45-50% -- Three-vessel coronary artery disease with high-grade lesions in the proximal left anterior descending artery and multiple lesions in  the right coronary -- Low normal ejection fraction -- Stable exertional angina -- Arvilla Meres, M.D. [facility]   COLONOSCOPY  09/26/2021   CORONARY ARTERY BYPASS GRAFT  02/19/2006    x3 (left internal mammary artery to LAD, left radial artery to posterior descending, saphenous vein graft to first diagonal), endoscopic vein harvest right leg -- SURGEON:  Salvatore Decent. Dorris Fetch, M.D.   HEMI-MICRODISCECTOMY LUMBAR LAMINECTOMY LEVEL 1 Left 06/08/2022   Procedure: Micro hemi laminectomy Lumbar Five-Sacral One left;  Surgeon: Jene Every, MD;  Location: [facility] OR;  Service: Orthopedics;  Laterality: Left;  90 mins 3 C-Bed   HERNIA REPAIR     at 68 y/o   SHOULDER ARTHROSCOPY Right 2007   tripple bypass     UPPER GASTROINTESTINAL ENDOSCOPY  09/26/2021     Physical Exam: Blood pressure 125/77, pulse 80, height 5\' 10"  (1.778 m), weight 203 lb (92.1 kg), SpO2 97%. Gen:       No acute distress ENT:  +mild cobblestoning, no nasal polyps, mucus membranes moist Lungs:    No increased respiratory effort, symmetric chest wall excursion, clear to auscultation bilaterally, no wheezes or crackles CV:         Regular rate and rhythm; no murmurs, rubs, or gallops.  No pedal edema Abd:      + bowel sounds; soft, non-tender; no distension MSK: no acute synovitis of DIP or PIP joints, no mechanics hands.  Skin:      Warm and dry; no rashes Neuro: normal speech, no focal facial asymmetry Psych: alert and oriented x3, normal mood and affect   Data Reviewed/Medical Decision Making:  Independent interpretation of tests: Imaging:  Review of patient's chest xray Nov 2024 images revealed no acute process. The patient's images have been independently reviewed by me.    PFTs:  Labs:  Lab Results  Component Value Date   NA 141 05/29/2023   K 3.9 05/29/2023   CO2 30 05/29/2023   GLUCOSE 185 (H) 05/29/2023   BUN 9 05/29/2023   CREATININE 0.85 05/29/2023   CALCIUM 9.1 05/29/2023   GFR 90.31 05/29/2023   GFRNONAA >60 06/01/2022   Lab Results  Component Value Date   WBC 9.4 05/29/2023   HGB 14.7 05/29/2023   HCT 44.9 05/29/2023   MCV 85.7 05/29/2023   PLT 218.0 05/29/2023     Immunization status:  Immunization History  Administered Date(s) Administered   Fluad Quad(high Dose 65+) 06/26/2022, 05/15/2023   Influenza Split 04/23/2012, 05/01/2013   Influenza, Seasonal, Injecte, Preservative Fre 04/21/2014   Influenza,inj,Quad PF,6+ Mos 07/27/2016, 03/21/2017, 05/27/2018, 05/03/2019, 05/12/2020, 05/16/2021   Influenza-Unspecified 04/21/2014, 05/03/2019, 05/12/2020   PFIZER(Purple Top)SARS-COV-2 Vaccination 10/09/2019, 11/03/2019, 05/04/2020   PNEUMOCOCCAL CONJUGATE-20 07/04/2021   Pfizer Covid-19 Vaccine Bivalent Booster 66yrs & up 06/26/2022   Pneumococcal Polysaccharide-23 07/10/2018   Tdap 06/21/2017, 05/12/2020   Zoster Recombinant(Shingrix) 07/04/2021     I  reviewed prior external note(s) from pcp  I reviewed the result(s) of the labs and imaging as noted above.   I have ordered     Assessment:  Chronic cough related to: Chronic rhinitis Mild intermittent asthma  Plan/Recommendations:  Start intermittent ICS-LABA with symbicort to take when experiencing URI.   Please call our office if using this inhaler on an as needed basis is not sufficient. You might need to be seen sooner than our scheduled follow up.   For your cough related to chronic rhinitis, start flonase nasal spray with nasal saline. You can pick up nasal saline to use  prior to flonase and as needed throughout the day.   We discussed disease management and progression at length today.   Return to Care: Return in about 6 months (around 03/09/2024).  Durel Salts, MD Pulmonary and Critical Care Medicine [facility] Office:[phone]  CC: Edwina Barth [place], [name]

## 2023-09-10 NOTE — Patient Instructions (Addendum)
It was a pleasure to see you today!  Please schedule follow up scheduled with myself in 6 months.  If my schedule is not open yet, we will contact you with a reminder closer to that time. Please call [phone] if you haven't heard from [place] a month before, and always call us sooner if issues or concerns arise. You can also send [place] a message through MyChart, but but aware that this is not to be used for urgent issues and it may take up to 5-7 days to receive a reply. Please be aware that you will likely be able to view your results before I have a chance to respond to them. Please give [place] 5 business days to respond to any non-urgent results.   I am recommending starting an inhaler for your asthma. Because you do not have daily symptoms, using this inhaler on an as needed basis may be sufficient. I recommend starting to use the inhaler daily as prescribed if you have symptoms of asthma such as chest tightness, wheezing, coughing, shortness of breath. You should also start using it if you have exposure to a sick contact, worsening allergies, or any other trigger for your asthma. I recommend you keep using it even after your respiratory symptoms resolve for 3-4 days. The goal of this therapy is to prevent your symptoms from becoming a flare severe enough to require steroids like prednisone.   Please call our office if using this inhaler on an as needed basis is not sufficient. You might need to be seen sooner than our scheduled follow up.   For your cough related to chronic rhinitis, start flonase nasal spray with nasal saline. You can pick up nasal saline to use prior to flonase and as needed throughout the day.   Flonase - 1 spray on each side of your nose twice a day for first week, then 1 spray on each side.   Instructions for use: If you also use a saline nasal spray or rinse, use that first. Position the head with the chin slightly tucked. Use the right hand to spray into the left nostril and the  right hand to spray into the left nostril.   Point the bottle away from the septum of your nose (cartilage that divides the two sides of your nose).  Hold the nostril closed on the opposite side from where you will spray Spray once and gently sniff to pull the medicine into the higher parts of your nose.  Don't sniff too hard as the medicine will drain down the back of your throat instead. Repeat with a second spray on the same side if prescribed. Repeat on the other side of your nose.

## 2023-09-11 ENCOUNTER — Encounter: Payer: Self-pay | Admitting: Internal Medicine

## 2023-09-11 ENCOUNTER — Ambulatory Visit: Payer: Medicare PPO | Admitting: Internal Medicine

## 2023-09-11 VITALS — BP 130/70 | HR 80 | Ht 70.0 in | Wt 199.8 lb

## 2023-09-11 DIAGNOSIS — E1159 Type 2 diabetes mellitus with other circulatory complications: Secondary | ICD-10-CM | POA: Diagnosis not present

## 2023-09-11 DIAGNOSIS — E785 Hyperlipidemia, unspecified: Secondary | ICD-10-CM

## 2023-09-11 DIAGNOSIS — Z7984 Long term (current) use of oral hypoglycemic drugs: Secondary | ICD-10-CM | POA: Diagnosis not present

## 2023-09-11 DIAGNOSIS — Z7985 Long-term (current) use of injectable non-insulin antidiabetic drugs: Secondary | ICD-10-CM

## 2023-09-11 DIAGNOSIS — E663 Overweight: Secondary | ICD-10-CM | POA: Diagnosis not present

## 2023-09-11 LAB — POCT GLYCOSYLATED HEMOGLOBIN (HGB A1C): Hemoglobin A1C: 6.5 % — AB (ref 4.0–5.6)

## 2023-09-11 NOTE — Patient Instructions (Addendum)
Please continue:  - Metformin ER 1000 mg 2x a day, with meals - Jardiance 25 mg before b'fast - Ozempic 2 mg weekly   Try to change b'fast.  Let me know if the sugars after b'fast do not improve.  Try to go to the [facility].  Please return in 4-5 months.

## 2023-09-11 NOTE — Progress Notes (Signed)
Patient ID: [name] Love, male   DOB: 07/22/1956, 68 y.o.   MRN: [id number]   HPI: [name] Love is a 68 y.o.-year-old male, initially referred by his PCP, [name] Love, returning for follow-up for DM2, dx in ~2008, non-insulin-dependent, uncontrolled, with long term complications (CAD - s/p CABG in 2007, CKD, PN, DR).  Last visit 6 months ago.  Interim history: No increased urination, blurry vision, nausea, chest pain.  He mentions a lingering cough after his COVID-19 infection 02/2023 >> now seeing pulmonology. Cough is better now on Flonase.  Reviewing HbA1c levels: Lab Results  Component Value Date   HGBA1C 6.9 (H) 05/29/2023   HGBA1C 6.4 03/09/2023   HGBA1C 6.8 (A) 10/19/2022   HGBA1C 6.8 Repeated and verified X2. (H) 07/06/2022   HGBA1C 6.7 (A) 06/19/2022   HGBA1C 6.6 (H) 06/01/2022   HGBA1C 6.8 (A) 02/06/2022   HGBA1C 7.1 (A) 09/19/2021   HGBA1C 7.1 (A) 05/16/2021   HGBA1C 7.3 (A) 01/03/2021   HGBA1C 6.4 (A) 09/14/2020   HGBA1C 6.9 (A) 05/12/2020   HGBA1C 6.9 (A) 12/02/2019   HGBA1C 6.9 (A) 08/04/2019   HGBA1C 7.3 (H) 06/10/2019  He got a steroid inj in shoulder >> 05/10/2017.  Pt is on a regimen of: - Metformin 1000 mg 2x a day, with meals >> ER formulation -improved diarrhea - Farxiga 10 mg >> Jardiance 25 before b'fast - Trulicity 1.5 mg weekly-started 05/2017 >> 3 mg weekly >> Ozempic 1 >> 2 mg weekly We stopped glipizide in 11/2019.  He checks his sugars more than 4 times a day with his freestyle libre CGM - [facility]:  Prev.:   Previously:  Lowest sugar was 66 (Libre) >> ...  90 >> 80s; it is unclear at which level he has hypoglycemia awareness. Highest sugar was 298 >> ... 200s >> 300s.  Pt's meals are: - Breakfast: bagel, OJ >> cereals >> occas. Cereals, or eggs  >> stopped OJ  - Lunch: soup or sandwich or leftovers - Dinner: meat, veggies - Snacks: not daily  -+ Mild CKD, last BUN/creatinine:  Lab Results  Component Value Date    BUN 9 05/29/2023   BUN 10 07/06/2022   CREATININE 0.85 05/29/2023   CREATININE 1.00 07/06/2022   Lab Results  Component Value Date   MICRALBCREAT 6.4 07/09/2023  On losartan 50.  -+ HL; last set of lipids: Lab Results  Component Value Date   CHOL 86 05/29/2023   HDL 36.20 (L) 05/29/2023   LDLCALC 3 05/29/2023   LDLDIRECT 21.0 07/06/2022   TRIG 234.0 (H) 05/29/2023   CHOLHDL 2 05/29/2023  On Lipitor, omega-3 fatty acids 3000 mg 2x a day.  - last eye exam was 03/22/2023: + DR.  Dr. London Love.   -+ Numbness and tingling in his feet.  Last foot exam 10/19/2022.  On ASA 81.  He has a history of a slightly elevated TSH, which normalized at last visit: Lab Results  Component Value Date   TSH 1.67 09/19/2021   TSH 5.170 (H) 06/24/2020   TSH 2.918 05/22/2013   TSH 1.843 09/12/2012   He had Covid19 02/26/2023, right after he returned from a 3-week excursion/cruise to [place], [place] and [place]. He still has back pain. Previously had steroid injections for this, then spinal stenosis surgery, then a nerve ablation >> did not work very well.  ROS: + See HPI  I reviewed pt's medications, allergies, PMH, social hx, family hx, and changes were documented in the history of present illness. Otherwise, unchanged  from my initial visit note.  Past Medical History:  Diagnosis Date   Anemia    Arthritis    Cancer (HCC)    skin cancer on left posterior neck - excised   Cataract    Coronary artery disease    post CABG x3 in 2007   Diabetes mellitus    type 2   GERD (gastroesophageal reflux disease)    Headache    no current problem   Hearing loss    some hearing loss bilateral - no hearing aids   History of kidney stones 1990   passed stone   Hyperlipidemia    Hypertension    Obesity    Past Surgical History:  Procedure Laterality Date   CARDIAC CATHETERIZATION  EF= 45-50%   EF= 45-50% -- Three-vessel coronary artery disease with high-grade lesions in the proximal left  anterior descending artery and multiple lesions in the right coronary -- Low normal ejection fraction -- Stable exertional angina -- [name] Love, M.D. [facility]   COLONOSCOPY  09/26/2021   CORONARY ARTERY BYPASS GRAFT  02/19/2006    x3 (left internal mammary artery to LAD, left radial artery to posterior descending, saphenous vein graft to first diagonal), endoscopic vein harvest right leg -- SURGEON:  [name] Love. [name] Love, M.D.   HEMI-MICRODISCECTOMY LUMBAR LAMINECTOMY LEVEL 1 Left 06/08/2022   Procedure: Micro hemi laminectomy Lumbar Five-Sacral One left;  Surgeon: [name] Every, MD;  Location: [facility] OR;  Service: Orthopedics;  Laterality: Left;  90 mins 3 C-Bed   HERNIA REPAIR     at 68 y/o   SHOULDER ARTHROSCOPY Right 2007   tripple bypass     UPPER GASTROINTESTINAL ENDOSCOPY  09/26/2021   Social History   Socioeconomic History   Marital status: Married    Spouse name: Not on file   Number of children: 3  Social Needs  Occupational History   unemployed  Tobacco Use   Smoking status: Never Smoker   Smokeless tobacco: Never Used  Substance and Sexual Activity   Alcohol use: Yes    Alcohol/week:     Types: 1-2 per mo   Drug use: No      Other Topics      Social History Narrative      Current Outpatient Medications on File Prior to Visit  Medication Sig Dispense Refill   acetaminophen (TYLENOL) 500 MG tablet Take 1,000 mg by mouth Love 8 (eight) hours as needed for moderate pain.     aspirin EC 81 MG tablet Take 1 tablet (81 mg total) by mouth daily. Swallow whole.     atorvastatin (LIPITOR) 40 MG tablet Take 1 tablet by mouth once daily 90 tablet 1   budesonide-formoterol (SYMBICORT) 80-4.5 MCG/ACT inhaler Inhale 2 puffs into the lungs in the morning and at bedtime. 1 each 3   empagliflozin (JARDIANCE) 25 MG TABS tablet Take 1 tablet (25 mg total) by mouth daily before breakfast. 90 tablet 3   fluticasone (FLONASE) 50 MCG/ACT nasal spray Place 1 spray into both nostrils  daily. 16 g 5   losartan (COZAAR) 50 MG tablet Take 1 tablet by mouth once daily 90 tablet 3   Magnesium Glycinate 665 MG CAPS Take 1 tablet by mouth in the morning and at bedtime.     metFORMIN (GLUCOPHAGE) 1000 MG tablet Take 1 tablet (1,000 mg total) by mouth 2 (two) times daily with a meal. 180 tablet 3   omeprazole (PRILOSEC) 20 MG capsule Take 1 capsule (20 mg total) by mouth daily.  90 capsule 0   Semaglutide, 2 MG/DOSE, (OZEMPIC, 2 MG/DOSE,) 8 MG/3ML SOPN Inject 2 mg into the skin once a week. 9 mL 3   No current facility-administered medications on file prior to visit.   No Known Allergies Family History  Problem Relation Age of Onset   Heart disease Mother    Colon cancer Mother        87   Hypertension Mother    Cancer Father        type unknown   Cancer - Lung Father    Heart attack Maternal Grandfather 71   Kidney disease Maternal Aunt    Diabetes Maternal Uncle    Colon polyps Neg Hx    Esophageal cancer Neg Hx    Rectal cancer Neg Hx    Stomach cancer Neg Hx    PE: BP 130/70   Pulse 80   Ht 5\' 10"  (1.778 m)   Wt 199 lb 12.8 oz (90.6 kg)   SpO2 98%   BMI 28.67 kg/m  Wt Readings from Last 10 Encounters:  09/11/23 199 lb 12.8 oz (90.6 kg)  09/10/23 203 lb (92.1 kg)  08/28/23 195 lb 12.8 oz (88.8 kg)  07/09/23 198 lb 9.6 oz (90.1 kg)  06/19/23 194 lb 6.4 oz (88.2 kg)  05/29/23 197 lb (89.4 kg)  03/09/23 193 lb (87.5 kg)  10/19/22 202 lb (91.6 kg)  09/01/22 195 lb 3.2 oz (88.5 kg)  08/21/22 195 lb (88.5 kg)   Constitutional: overweight, in NAD Eyes: EOMI, no exophthalmos ENT: no thyromegaly, no cervical lymphadenopathy Cardiovascular: RRR, No MRG Respiratory: CTA B Musculoskeletal: no deformities Skin: no rashes Neurological: no tremor with outstretched hands Diabetic Foot Exam - Simple   Simple Foot Form Diabetic Foot exam was performed with the following findings: Yes 09/11/2023 11:29 AM  Visual Inspection No deformities, no ulcerations, no other  skin breakdown bilaterally: Yes Sensation Testing Intact to touch and monofilament testing bilaterally: Yes Pulse Check See comments: Yes Comments  Patches of decreased sensation B feet. Onychodystrophy all 10 toenails.     ASSESSMENT: 1. DM2, non-insulin-dependent, uncontrolled, without complications - CAD - s/p CABG in 2007 - CKD - PN - DR  2. HL  3. Overweight  PLAN:  1. Patient with longstanding, uncontrolled, type 2 diabetes, on metformin ER, SGLT2 inhibitor, and weekly GLP-1 receptor agonist, with fair control.  At last visit, HbA1c was lower, at 6.4% but he had another HbA1c obtained 3 months ago and this was higher, at 6.9%. -At last visit, sugars appears to be well-controlled, with the vast majority of the blood sugars at goal and only occasional slight hide her glycemic peaks after dinner.  We did not change his regimen. CGM interpretation: -At today's visit, we reviewed his CGM downloads: It appears that 71% of values are in target range (goal >70%), while 29% are higher than 180 (goal <25%), and 0% are lower than 70 (goal <4%).  The calculated average blood sugar is 163.  The projected HbA1c for the next 3 months (GMI) is 7.2%. -Reviewing the CGM trends, sugars appear to be well-controlled overnight, especially in the second half of the night, but they increase after breakfast and then remain elevated, fluctuating above the upper limit of the target range, 180 mg/dL.  Sugars start improving again after midnight. -At today's visit, we discussed about trying to improve breakfast.  He eats different foods for this meal, and I advised him to try to look at what he is eating and  eliminate the ones that are increasing his blood sugars so much.  I believe that if we are able to improve breakfast, the sugars may remain controlled during the rest of the day.  For now, I did not recommend a change in regimen but we discussed about possible options: Switching from Ozempic to [facility],  adding back glipizide, or mealtime insulin before breakfast. - I suggested to:  Patient Instructions  Please continue:  - Metformin ER 1000 mg 2x a day, with meals - Jardiance 25 mg before b'fast - Ozempic 2 mg weekly   Try to change b'fast.  Let me know if the sugars after b'fast do not improve.  Try to go to the [facility].  Please return in 4-5 months.  - we checked his HbA1c: 6.5% (lower) - advised to check sugars at different times of the day - 4x a day, rotating check times - advised for yearly eye exams >> he is UTD - he has significant onychodystrophy and decreased pulses in his legs.  He also has some trophic changes in his feet.  I recommended to see podiatry.  He did not see them yet.  I again recommended [facility] at today's visit. - return to clinic in 4-6 months  2.  Hyperlipidemia -Latest lipid panel showed a low LDL, slightly low HDL and elevated triglyceride level: Lab Results  Component Value Date   CHOL 86 05/29/2023   HDL 36.20 (L) 05/29/2023   LDLCALC 3 05/29/2023   LDLDIRECT 21.0 07/06/2022   TRIG 234.0 (H) 05/29/2023   CHOLHDL 2 05/29/2023  -He continues Lipitor 40 mg daily, omega-3 fatty acids  3.  Overweight -continue SGLT 2 inhibitor and GLP-1 receptor agonist which should also help with weight loss -He lost 9 pounds before last visit, previously gained 7 -He gained 6 pounds since last visit  Carlus Pavlov, MD PhD [facility] Endocrinology

## 2023-10-09 ENCOUNTER — Encounter: Payer: Self-pay | Admitting: Internal Medicine

## 2023-10-23 DIAGNOSIS — E1159 Type 2 diabetes mellitus with other circulatory complications: Secondary | ICD-10-CM | POA: Diagnosis not present

## 2023-11-04 ENCOUNTER — Other Ambulatory Visit: Payer: Self-pay | Admitting: Internal Medicine

## 2023-11-13 ENCOUNTER — Other Ambulatory Visit (INDEPENDENT_AMBULATORY_CARE_PROVIDER_SITE_OTHER)

## 2023-11-13 DIAGNOSIS — D509 Iron deficiency anemia, unspecified: Secondary | ICD-10-CM | POA: Diagnosis not present

## 2023-11-13 LAB — IBC + FERRITIN
Ferritin: 11.5 ng/mL — ABNORMAL LOW (ref 22.0–322.0)
Iron: 51 ug/dL (ref 42–165)
Saturation Ratios: 9 % — ABNORMAL LOW (ref 20.0–50.0)
TIBC: 568.4 ug/dL — ABNORMAL HIGH (ref 250.0–450.0)
Transferrin: 406 mg/dL — ABNORMAL HIGH (ref 212.0–360.0)

## 2023-11-14 ENCOUNTER — Encounter: Payer: Self-pay | Admitting: Gastroenterology

## 2023-11-14 NOTE — Progress Notes (Signed)
 [name] Love, Your iron tests again are suggestive of iron deficiency.  For some reason, the other lab test (complete blood count) was not processed.  Unfortunately, I would resume taking your iron tablet again once daily, and lets plan to recheck an iron panel and a CBC in 3 months.  Maya,  Please place an order for CBC and iron panel for 3 months from now.

## 2023-11-15 ENCOUNTER — Other Ambulatory Visit: Payer: Self-pay

## 2023-11-15 DIAGNOSIS — D509 Iron deficiency anemia, unspecified: Secondary | ICD-10-CM

## 2023-12-03 DIAGNOSIS — L821 Other seborrheic keratosis: Secondary | ICD-10-CM | POA: Diagnosis not present

## 2023-12-03 DIAGNOSIS — L570 Actinic keratosis: Secondary | ICD-10-CM | POA: Diagnosis not present

## 2023-12-03 DIAGNOSIS — D1801 Hemangioma of skin and subcutaneous tissue: Secondary | ICD-10-CM | POA: Diagnosis not present

## 2023-12-03 DIAGNOSIS — Z08 Encounter for follow-up examination after completed treatment for malignant neoplasm: Secondary | ICD-10-CM | POA: Diagnosis not present

## 2023-12-03 DIAGNOSIS — D485 Neoplasm of uncertain behavior of skin: Secondary | ICD-10-CM | POA: Diagnosis not present

## 2023-12-03 DIAGNOSIS — Z85828 Personal history of other malignant neoplasm of skin: Secondary | ICD-10-CM | POA: Diagnosis not present

## 2023-12-03 DIAGNOSIS — C44519 Basal cell carcinoma of skin of other part of trunk: Secondary | ICD-10-CM | POA: Diagnosis not present

## 2023-12-03 DIAGNOSIS — L814 Other melanin hyperpigmentation: Secondary | ICD-10-CM | POA: Diagnosis not present

## 2023-12-08 ENCOUNTER — Other Ambulatory Visit: Payer: Self-pay | Admitting: Internal Medicine

## 2023-12-09 ENCOUNTER — Encounter: Payer: Self-pay | Admitting: Internal Medicine

## 2023-12-10 MED ORDER — METFORMIN HCL 1000 MG PO TABS: 1000.0000 mg | ORAL_TABLET | Freq: Two times a day (BID) | ORAL | 3 refills | Status: AC

## 2023-12-11 ENCOUNTER — Ambulatory Visit (INDEPENDENT_AMBULATORY_CARE_PROVIDER_SITE_OTHER)

## 2023-12-11 VITALS — Ht 70.0 in | Wt 199.0 lb

## 2023-12-11 DIAGNOSIS — Z0000 Encounter for general adult medical examination without abnormal findings: Secondary | ICD-10-CM

## 2023-12-11 NOTE — Progress Notes (Signed)
 Subjective:   [name] Love is a 68 y.o. who presents for a Medicare Wellness preventive visit.  As a reminder, Annual Wellness Visits don't include a physical exam, and some assessments may be limited, especially if this visit is performed virtually. We may recommend an in-person follow-up visit with your provider if needed.  Visit Complete: Virtual I connected with  Azari Ralston-Asumendi on 12/11/23 by a audio enabled telemedicine application and verified that I am speaking with the correct person using two identifiers.  Patient Location: Home  Provider Location: Home Office  I discussed the limitations of evaluation and management by telemedicine. The patient expressed understanding and agreed to proceed.  Vital Signs: Because this visit was a virtual/telehealth visit, some criteria may be missing or patient reported. Any vitals not documented were not able to be obtained and vitals that have been documented are patient reported.  VideoDeclined- This patient declined Librarian, academic. Therefore the visit was completed with audio only.  Persons Participating in Visit: Patient.  AWV Questionnaire: No: Patient Medicare AWV questionnaire was not completed prior to this visit.  Cardiac Risk Factors include: advanced age (>43men, >71 women);hypertension;male gender;diabetes mellitus;dyslipidemia     Objective:     Today's Vitals   12/11/23 1106  Weight: 199 lb (90.3 kg)  Height: 5\' 10"  (1.778 m)   Body mass index is 28.55 kg/m.     12/11/2023   11:44 AM 06/01/2022   11:23 AM  Advanced Directives  Does Patient Have a Medical Advance Directive? Yes Yes  Type of Estate agent of [place];Living will Living will;Healthcare Power of Attorney  Does patient want to make changes to medical advance directive?  No - Guardian declined  Copy of Healthcare Power of Attorney in Chart? No - copy requested No - copy requested     Current Medications (verified) Outpatient Encounter Medications as of 12/11/2023  Medication Sig   acetaminophen  (TYLENOL ) 500 MG tablet Take 1,000 mg by mouth every 8 (eight) hours as needed for moderate pain.   aspirin  EC 81 MG tablet Take 1 tablet (81 mg total) by mouth daily. Swallow whole.   atorvastatin  (LIPITOR) 40 MG tablet Take 1 tablet by mouth once daily   budesonide -formoterol  (SYMBICORT ) 80-4.5 MCG/ACT inhaler Inhale 2 puffs into the lungs in the morning and at bedtime.   empagliflozin  (JARDIANCE ) 25 MG TABS tablet Take 1 tablet (25 mg total) by mouth daily before breakfast.   fluticasone  (FLONASE ) 50 MCG/ACT nasal spray Place 1 spray into both nostrils daily.   losartan  (COZAAR ) 50 MG tablet Take 1 tablet by mouth once daily   Magnesium  Glycinate 665 MG CAPS Take 1 tablet by mouth in the morning and at bedtime.   metFORMIN  (GLUCOPHAGE ) 1000 MG tablet Take 1 tablet (1,000 mg total) by mouth 2 (two) times daily with a meal.   omeprazole  (PRILOSEC) 20 MG capsule Take 1 capsule (20 mg total) by mouth daily.   OZEMPIC , 2 MG/DOSE, 8 MG/3ML SOPN INJECT 2 MG INTO THE SKIN ONCE A WEEK   No facility-administered encounter medications on file as of 12/11/2023.    Allergies (verified) Patient has no known allergies.   History: Past Medical History:  Diagnosis Date   Anemia    Arthritis    Cancer (HCC)    skin cancer on left posterior neck - excised   Cataract    Coronary artery disease    post CABG x3 in 2007   Diabetes mellitus    type 2  GERD (gastroesophageal reflux disease)    Headache    no current problem   Hearing loss    some hearing loss bilateral - no hearing aids   History of kidney stones 1990   passed stone   Hyperlipidemia    Hypertension    Obesity    Past Surgical History:  Procedure Laterality Date   CARDIAC CATHETERIZATION  EF= 45-50%   EF= 45-50% -- Three-vessel coronary artery disease with high-grade lesions in the proximal left anterior  descending artery and multiple lesions in the right coronary -- Low normal ejection fraction -- Stable exertional angina -- Jules Oar, M.D. [facility]   COLONOSCOPY  09/26/2021   CORONARY ARTERY BYPASS GRAFT  02/19/2006    x3 (left internal mammary artery to LAD, left radial artery to posterior descending, saphenous vein graft to first diagonal), endoscopic vein harvest right leg -- SURGEON:  Milon Aloe. Luna Salinas, M.D.   HEMI-MICRODISCECTOMY LUMBAR LAMINECTOMY LEVEL 1 Left 06/08/2022   Procedure: Micro hemi laminectomy Lumbar Five-Sacral One left;  Surgeon: Orvan Blanch, MD;  Location: [facility] OR;  Service: Orthopedics;  Laterality: Left;  90 mins 3 C-Bed   HERNIA REPAIR     at 68 y/o   SHOULDER ARTHROSCOPY Right 2007   tripple bypass     UPPER GASTROINTESTINAL ENDOSCOPY  09/26/2021   Family History  Problem Relation Age of Onset   Heart disease Mother    Colon cancer Mother        87   Hypertension Mother    Cancer Father        type unknown   Cancer - Lung Father    Heart attack Maternal Grandfather 58   Kidney disease Maternal Aunt    Diabetes Maternal Uncle    Colon polyps Neg Hx    Esophageal cancer Neg Hx    Rectal cancer Neg Hx    Stomach cancer Neg Hx    Social History   Socioeconomic History   Marital status: Married    Spouse name: Nancyann Aye   Number of children: 3   Years of education: Not on file   Highest education level: Bachelor's degree (e.g., BA, AB, BS)  Occupational History   Occupation: retired  Tobacco Use   Smoking status: Never   Smokeless tobacco: Never  Vaping Use   Vaping status: Never Used  Substance and Sexual Activity   Alcohol use: Yes    Alcohol/week: 1.0 standard drink of alcohol    Types: 1 Standard drinks or equivalent per week    Comment: 1 drink monthly   Drug use: Never   Sexual activity: Yes  Other Topics Concern   Not on file  Social History Narrative   Lives with wife and 1 cat/2025   Social Drivers of Health   Financial  Resource Strain: Low Risk  (07/07/2023)   Overall Financial Resource Strain (CARDIA)    Difficulty of Paying Living Expenses: Not hard at all  Food Insecurity: No Food Insecurity (07/07/2023)   Hunger Vital Sign    Worried About Running Out of Food in the Last Year: Never true    Ran Out of Food in the Last Year: Never true  Transportation Needs: No Transportation Needs (12/11/2023)   PRAPARE - Administrator, Civil Service (Medical): No    Lack of Transportation (Non-Medical): No  Physical Activity: Sufficiently Active (12/11/2023)   Exercise Vital Sign    Days of Exercise per Week: 3 days    Minutes of Exercise per Session:  60 min  Recent Concern: Physical Activity - Insufficiently Active (12/11/2023)   Exercise Vital Sign    Days of Exercise per Week: 2 days    Minutes of Exercise per Session: 60 min  Stress: No Stress Concern Present (12/11/2023)   [employer] of Occupational Health - Occupational Stress Questionnaire    Feeling of Stress : Not at all  Social Connections: Moderately Integrated (12/11/2023)   Social Connection and Isolation Panel [NHANES]    Frequency of Communication with Friends and Family: More than three times a week    Frequency of Social Gatherings with Friends and Family: Twice a week    Attends Religious Services: Never    Database administrator or Organizations: Yes    Attends Engineer, structural: 1 to 4 times per year    Marital Status: Married    Tobacco Counseling Counseling given: Not Answered    Clinical Intake:  Pre-visit preparation completed: Yes  Pain : No/denies pain     BMI - recorded: 28.55 Nutritional Status: BMI 25 -29 Overweight Nutritional Risks: None Diabetes: Yes CBG done?: Yes (178 after breakfast-per pt) Did pt. bring in CBG monitor from home?: No  Lab Results  Component Value Date   HGBA1C 6.5 (A) 09/11/2023   HGBA1C 6.9 (H) 05/29/2023   HGBA1C 6.4 03/09/2023     How often do you need to  have someone help you when you read instructions, pamphlets, or other written materials from your doctor or pharmacy?: 1 - Never  Interpreter Needed?: No  Information entered by :: Demitrios Molyneux, RMA   Activities of Daily Living     12/11/2023   11:38 AM  In your present state of health, do you have any difficulty performing the following activities:  Hearing? 1  Comment Slight hearing difficulties -per patient  Vision? 0  Difficulty concentrating or making decisions? 0  Walking or climbing stairs? 0  Dressing or bathing? 0  Doing errands, shopping? 0  Preparing Food and eating ? N  Using the Toilet? N  In the past six months, have you accidently leaked urine? N  Do you have problems with loss of bowel control? N  Managing your Medications? N  Managing your Finances? N  Housekeeping or managing your Housekeeping? N    Patient Care Team: Elvira Hammersmith, MD as PCP - General (Internal Medicine) Lucendia Rusk, MD as PCP - Cardiology (Cardiology)  Indicate any recent Medical Services you may have received from other than [facility] providers in the past year (date may be approximate).     Assessment:    This is a routine wellness examination for [facility].  Hearing/Vision screen Hearing Screening - Comments:: Slight hearing difficulties -per patient  Vision Screening - Comments:: Denies vision issues.Lafrances Pigeon Cataract/Dr. Alto Atta    Goals Addressed   None    Depression Screen     12/11/2023   11:49 AM 07/09/2023    1:38 PM 06/19/2023    3:31 PM 05/29/2023    1:25 PM 07/06/2022    2:55 PM 04/17/2022    1:28 PM 07/04/2021   11:10 AM  PHQ 2/9 Scores  PHQ - 2 Score 0 0 0 0 0 0 0  PHQ- 9 Score 0    0 0     Fall Risk     12/11/2023   11:44 AM 09/10/2023    2:53 PM 07/09/2023    1:38 PM 06/19/2023    3:30 PM 05/29/2023    1:25 PM  Fall Risk   Falls in the past year? 0 0 0 0 0  Number falls in past yr: 0  0 0 0  Injury with Fall? 0  0 0 0  Risk for fall due to :  Impaired balance/gait  No Fall Risks No Fall Risks No Fall Risks  Follow up Falls evaluation completed  Falls evaluation completed Falls evaluation completed Falls evaluation completed    MEDICARE RISK AT HOME:  Medicare Risk at Home Any stairs in or around the home?: Yes (2 level home) If so, are there any without handrails?: Yes Home free of loose throw rugs in walkways, pet beds, electrical cords, etc?: Yes Adequate lighting in your home to reduce risk of falls?: Yes Life alert?: No Use of a cane, walker or w/c?: No Grab bars in the bathroom?: Yes Shower chair or bench in shower?: Yes Elevated toilet seat or a handicapped toilet?: Yes  TIMED UP AND GO:  Was the test performed?  No  Cognitive Function: Declined/Normal: No cognitive concerns noted by patient or family. Patient alert, oriented, able to answer questions appropriately and recall recent events. No signs of memory loss or confusion.        Immunizations Immunization History  Administered Date(s) Administered   Fluad Quad(high Dose 65+) 06/26/2022, 05/15/2023   Influenza Split 04/23/2012, 05/01/2013   Influenza, Seasonal, Injecte, Preservative Fre 04/21/2014   Influenza,inj,Quad PF,6+ Mos 07/27/2016, 03/21/2017, 05/27/2018, 05/03/2019, 05/12/2020, 05/16/2021   Influenza-Unspecified 04/21/2014, 05/03/2019, 05/12/2020   PFIZER(Purple Top)SARS-COV-2 Vaccination 10/09/2019, 11/03/2019, 05/04/2020   PNEUMOCOCCAL CONJUGATE-20 07/04/2021   Pfizer Covid-19 Vaccine Bivalent Booster 79yrs & up 06/26/2022   Pneumococcal Polysaccharide-23 07/10/2018   Tdap 06/21/2017, 05/12/2020   Zoster Recombinant(Shingrix) 07/04/2021    Screening Tests Health Maintenance  Topic Date Due   Medicare Annual Wellness (AWV)  Never done   Zoster Vaccines- Shingrix (2 of 2) 08/29/2021   COVID-19 Vaccine (5 - 2024-25 season) 03/25/2023   INFLUENZA VACCINE  02/22/2024   HEMOGLOBIN A1C  03/10/2024   OPHTHALMOLOGY EXAM  03/21/2024   Diabetic  kidney evaluation - eGFR measurement  05/28/2024   Diabetic kidney evaluation - Urine ACR  07/08/2024   FOOT EXAM  09/10/2024   DTaP/Tdap/Td (3 - Td or Tdap) 05/12/2030   Colonoscopy  09/27/2031   Pneumonia Vaccine 35+ Years old  Completed   Hepatitis C Screening  Completed   HPV VACCINES  Aged Out   Meningococcal B Vaccine  Aged Out   Fecal DNA (Cologuard)  Discontinued    Health Maintenance  Health Maintenance Due  Topic Date Due   Medicare Annual Wellness (AWV)  Never done   Zoster Vaccines- Shingrix (2 of 2) 08/29/2021   COVID-19 Vaccine (5 - 2024-25 season) 03/25/2023   Health Maintenance Items Addressed: See Nurse Notes  Additional Screening:  Vision Screening: Recommended annual ophthalmology exams for early detection of glaucoma and other disorders of the eye.  Dental Screening: Recommended annual dental exams for proper oral hygiene  Community Resource Referral / Chronic Care Management: CRR required this visit?  No   CCM required this visit?  No   Plan:    I have personally reviewed and noted the following in the patient's chart:   Medical and social history Use of alcohol, tobacco or illicit drugs  Current medications and supplements including opioid prescriptions. Patient is not currently taking opioid prescriptions. Functional ability and status Nutritional status Physical activity Advanced directives List of other physicians Hospitalizations, surgeries, and ER visits in previous 12 months  Vitals Screenings to include cognitive, depression, and falls Referrals and appointments  In addition, I have reviewed and discussed with patient certain preventive protocols, quality metrics, and best practice recommendations. A written personalized care plan for preventive services as well as general preventive health recommendations were provided to patient.   Tishanna Dunford L Keoshia Steinmetz, CMA   12/11/2023   After Visit Summary: (MyChart) Due to this being a telephonic  visit, the after visit summary with patients personalized plan was offered to patient via MyChart   Notes: Please refer to Routing Comments.

## 2023-12-11 NOTE — Patient Instructions (Signed)
 [name] Love , Thank you for taking time out of your busy schedule to complete your Annual Wellness Visit with me. I enjoyed our conversation and look forward to speaking with you again next year. I, as well as your care team,  appreciate your ongoing commitment to your health goals. Please review the following plan we discussed and let me know if I can assist you in the future. Your Game plan/ To Do List    Follow up Visits: Next Medicare AWV with our clinical staff: 12/11/2024.   Have you seen your provider in the last 6 months (3 months if uncontrolled diabetes)? Yes Next Office Visit with your provider: 12/31/2023.  Clinician Recommendations:  Aim for 30 minutes of exercise or brisk walking, 6-8 glasses of water, and 5 servings of fruits and vegetables each day. Keep up the good work.        This is a list of the screening recommended for you and due dates:  Health Maintenance  Topic Date Due   Medicare Annual Wellness Visit  Never done   Zoster (Shingles) Vaccine (2 of 2) 08/29/2021   COVID-19 Vaccine (5 - 2024-25 season) 03/25/2023   Flu Shot  02/22/2024   Hemoglobin A1C  03/10/2024   Eye exam for diabetics  03/21/2024   Yearly kidney function blood test for diabetes  05/28/2024   Yearly kidney health urinalysis for diabetes  07/08/2024   Complete foot exam   09/10/2024   DTaP/Tdap/Td vaccine (3 - Td or Tdap) 05/12/2030   Colon Cancer Screening  09/27/2031   Pneumonia Vaccine  Completed   Hepatitis C Screening  Completed   HPV Vaccine  Aged Out   Meningitis B Vaccine  Aged Out   Cologuard (Stool DNA test)  Discontinued    Advanced directives: (Copy Requested) Please bring a copy of your health care power of attorney and living will to the office to be added to your chart at your convenience. You can mail to [facility] [address] [place], [place] [address] or email to [email or web address] Advance Care Planning is important because it:  [x]   Makes sure you receive the medical care that is consistent with your values, goals, and preferences  [x]  It provides guidance to your family and loved ones and reduces their decisional burden about whether or not they are making the right decisions based on your wishes.  Follow the link provided in your after visit summary or read over the paperwork we have mailed to you to help you started getting your Advance Directives in place. If you need assistance in completing these, please reach out to us  so that we can help you!  See attachments for Preventive Care and Fall Prevention Tips.

## 2023-12-26 DIAGNOSIS — C44519 Basal cell carcinoma of skin of other part of trunk: Secondary | ICD-10-CM | POA: Diagnosis not present

## 2023-12-31 ENCOUNTER — Ambulatory Visit: Payer: Medicare PPO | Admitting: Emergency Medicine

## 2023-12-31 ENCOUNTER — Encounter: Payer: Self-pay | Admitting: Emergency Medicine

## 2023-12-31 VITALS — BP 114/64 | HR 79 | Temp 98.1°F | Ht 70.0 in | Wt 197.0 lb

## 2023-12-31 DIAGNOSIS — E1169 Type 2 diabetes mellitus with other specified complication: Secondary | ICD-10-CM | POA: Diagnosis not present

## 2023-12-31 DIAGNOSIS — I152 Hypertension secondary to endocrine disorders: Secondary | ICD-10-CM | POA: Diagnosis not present

## 2023-12-31 DIAGNOSIS — Z7985 Long-term (current) use of injectable non-insulin antidiabetic drugs: Secondary | ICD-10-CM | POA: Diagnosis not present

## 2023-12-31 DIAGNOSIS — E1159 Type 2 diabetes mellitus with other circulatory complications: Secondary | ICD-10-CM | POA: Diagnosis not present

## 2023-12-31 DIAGNOSIS — E785 Hyperlipidemia, unspecified: Secondary | ICD-10-CM | POA: Diagnosis not present

## 2023-12-31 LAB — CBC WITH DIFFERENTIAL/PLATELET
Basophils Absolute: 0 10*3/uL (ref 0.0–0.1)
Basophils Relative: 0.8 % (ref 0.0–3.0)
Eosinophils Absolute: 0.3 10*3/uL (ref 0.0–0.7)
Eosinophils Relative: 6 % — ABNORMAL HIGH (ref 0.0–5.0)
HCT: 44 % (ref 39.0–52.0)
Hemoglobin: 14.6 g/dL (ref 13.0–17.0)
Lymphocytes Relative: 24 % (ref 12.0–46.0)
Lymphs Abs: 1.4 10*3/uL (ref 0.7–4.0)
MCHC: 33.2 g/dL (ref 30.0–36.0)
MCV: 79.8 fl (ref 78.0–100.0)
Monocytes Absolute: 0.4 10*3/uL (ref 0.1–1.0)
Monocytes Relative: 7.4 % (ref 3.0–12.0)
Neutro Abs: 3.5 10*3/uL (ref 1.4–7.7)
Neutrophils Relative %: 61.8 % (ref 43.0–77.0)
Platelets: 176 10*3/uL (ref 150.0–400.0)
RBC: 5.51 Mil/uL (ref 4.22–5.81)
RDW: 16.7 % — ABNORMAL HIGH (ref 11.5–15.5)
WBC: 5.7 10*3/uL (ref 4.0–10.5)

## 2023-12-31 LAB — COMPREHENSIVE METABOLIC PANEL WITH GFR
ALT: 24 U/L (ref 0–53)
AST: 18 U/L (ref 0–37)
Albumin: 4.7 g/dL (ref 3.5–5.2)
Alkaline Phosphatase: 50 U/L (ref 39–117)
BUN: 16 mg/dL (ref 6–23)
CO2: 25 meq/L (ref 19–32)
Calcium: 9.5 mg/dL (ref 8.4–10.5)
Chloride: 101 meq/L (ref 96–112)
Creatinine, Ser: 1.03 mg/dL (ref 0.40–1.50)
GFR: 75.19 mL/min (ref >60.00)
Glucose, Bld: 183 mg/dL — ABNORMAL HIGH (ref 70–99)
Potassium: 3.7 meq/L (ref 3.5–5.1)
Sodium: 139 meq/L (ref 135–145)
Total Bilirubin: 0.6 mg/dL (ref 0.2–1.2)
Total Protein: 7.7 g/dL (ref 6.0–8.3)

## 2023-12-31 LAB — LIPID PANEL
Cholesterol: 77 mg/dL (ref 0–200)
HDL: 32.9 mg/dL — ABNORMAL LOW (ref >39.00)
LDL Cholesterol: 2 mg/dL (ref 0–99)
NonHDL: 44.57
Total CHOL/HDL Ratio: 2
Triglycerides: 212 mg/dL — ABNORMAL HIGH (ref 0.0–149.0)
VLDL: 42.4 mg/dL — ABNORMAL HIGH (ref 0.0–40.0)

## 2023-12-31 LAB — MICROALBUMIN / CREATININE URINE RATIO
Creatinine,U: 61.2 mg/dL
Microalb Creat Ratio: 40.4 mg/g — ABNORMAL HIGH (ref 0.0–30.0)
Microalb, Ur: 2.5 mg/dL — ABNORMAL HIGH (ref 0.0–1.9)

## 2023-12-31 LAB — HEMOGLOBIN A1C: Hgb A1c MFr Bld: 7.1 % — ABNORMAL HIGH (ref 4.6–6.5)

## 2023-12-31 NOTE — Assessment & Plan Note (Signed)
 Stable chronic conditions Continue Jardiance, metformin, and Ozempic as detailed above Continue atorvastatin 40 mg daily

## 2023-12-31 NOTE — Patient Instructions (Signed)
 Health Maintenance After Age 68 After age 4, you are at a higher risk for certain long-term diseases and infections as well as injuries from falls. Falls are a major cause of broken bones and head injuries in people who are older than age 47. Getting regular preventive care can help to keep you healthy and well. Preventive care includes getting regular testing and making lifestyle changes as recommended by your health care provider. Talk with your health care provider about: Which screenings and tests you should have. A screening is a test that checks for a disease when you have no symptoms. A diet and exercise plan that is right for you. What should I know about screenings and tests to prevent falls? Screening and testing are the best ways to find a health problem early. Early diagnosis and treatment give you the best chance of managing medical conditions that are common after age 37. Certain conditions and lifestyle choices may make you more likely to have a fall. Your health care provider may recommend: Regular vision checks. Poor vision and conditions such as cataracts can make you more likely to have a fall. If you wear glasses, make sure to get your prescription updated if your vision changes. Medicine review. Work with your health care provider to regularly review all of the medicines you are taking, including over-the-counter medicines. Ask your health care provider about any side effects that may make you more likely to have a fall. Tell your health care provider if any medicines that you take make you feel dizzy or sleepy. Strength and balance checks. Your health care provider may recommend certain tests to check your strength and balance while standing, walking, or changing positions. Foot health exam. Foot pain and numbness, as well as not wearing proper footwear, can make you more likely to have a fall. Screenings, including: Osteoporosis screening. Osteoporosis is a condition that causes  the bones to get weaker and break more easily. Blood pressure screening. Blood pressure changes and medicines to control blood pressure can make you feel dizzy. Depression screening. You may be more likely to have a fall if you have a fear of falling, feel depressed, or feel unable to do activities that you used to do. Alcohol use screening. Using too much alcohol can affect your balance and may make you more likely to have a fall. Follow these instructions at home: Lifestyle Do not drink alcohol if: Your health care provider tells you not to drink. If you drink alcohol: Limit how much you have to: 0-1 drink a day for women. 0-2 drinks a day for men. Know how much alcohol is in your drink. In the [place] one drink equals one 12 oz bottle of beer (355 mL), one 5 oz glass of wine (148 mL), or one 1 oz glass of hard liquor (44 mL). Do not use any products that contain nicotine or tobacco. These products include cigarettes, chewing tobacco, and vaping devices, such as e-cigarettes. If you need help quitting, ask your health care provider. Activity  Follow a regular exercise program to stay fit. This will help you maintain your balance. Ask your health care provider what types of exercise are appropriate for you. If you need a cane or walker, use it as recommended by your health care provider. Wear supportive shoes that have nonskid soles. Safety  Remove any tripping hazards, such as rugs, cords, and clutter. Install safety equipment such as grab bars in bathrooms and safety rails on stairs. Keep rooms and walkways  well-lit. General instructions Talk with your health care provider about your risks for falling. Tell your health care provider if: You fall. Be sure to tell your health care provider about all falls, even ones that seem minor. You feel dizzy, tiredness (fatigue), or off-balance. Take over-the-counter and prescription medicines only as told by your health care provider. These include  supplements. Eat a healthy diet and maintain a healthy weight. A healthy diet includes low-fat dairy products, low-fat (lean) meats, and fiber from whole grains, beans, and lots of fruits and vegetables. Stay current with your vaccines. Schedule regular health, dental, and eye exams. Summary Having a healthy lifestyle and getting preventive care can help to protect your health and wellness after age 11. Screening and testing are the best way to find a health problem early and help you avoid having a fall. Early diagnosis and treatment give you the best chance for managing medical conditions that are more common for people who are older than age 28. Falls are a major cause of broken bones and head injuries in people who are older than age 48. Take precautions to prevent a fall at home. Work with your health care provider to learn what changes you can make to improve your health and wellness and to prevent falls. This information is not intended to replace advice given to you by your health care provider. Make sure you discuss any questions you have with your health care provider. Document Revised: 11/29/2020 Document Reviewed: 11/29/2020 Elsevier Patient Education  2024 [employer].

## 2023-12-31 NOTE — Progress Notes (Signed)
 [name] Love 68 y.o.   Chief Complaint  Patient presents with   Follow-up    6 month f/u. No other concerns     HISTORY OF PRESENT ILLNESS: This is a 67 y.o. male here for 23-month follow-up of chronic medical conditions including diabetes, hypertension, dyslipidemia Overall doing well.  Has no complaints or medical concerns today. Lab Results  Component Value Date   HGBA1C 6.5 (A) 09/11/2023     HPI   Prior to Admission medications   Medication Sig Start Date End Date Taking? Authorizing Provider  acetaminophen  (TYLENOL ) 500 MG tablet Take 1,000 mg by mouth every 8 (eight) hours as needed for moderate pain.   Yes [provider]  aspirin  EC 81 MG tablet Take 1 tablet (81 mg total) by mouth daily. Swallow whole. 03/15/23  Yes Lucendia Rusk, MD  atorvastatin  (LIPITOR) 40 MG tablet Take 1 tablet by mouth once daily 05/10/23  Yes Walker, Caitlin S, NP  budesonide -formoterol  (SYMBICORT ) 80-4.5 MCG/ACT inhaler Inhale 2 puffs into the lungs in the morning and at bedtime. 09/10/23  Yes Aleck Hurdle, MD  empagliflozin  (JARDIANCE ) 25 MG TABS tablet Take 1 tablet (25 mg total) by mouth daily before breakfast. 03/09/23  Yes Emilie Harden, MD  fluticasone  (FLONASE ) 50 MCG/ACT nasal spray Place 1 spray into both nostrils daily. 09/10/23  Yes Aleck Hurdle, MD  losartan  (COZAAR ) 50 MG tablet Take 1 tablet by mouth once daily 06/04/23  Yes Catrena Vari, Isidro Margo, MD  Magnesium  Glycinate 665 MG CAPS Take 1 tablet by mouth in the morning and at bedtime.   Yes [provider]  metFORMIN  (GLUCOPHAGE ) 1000 MG tablet Take 1 tablet (1,000 mg total) by mouth 2 (two) times daily with a meal. 12/10/23  Yes Emilie Harden, MD  Omega-3 Fatty Acids (FISH OIL ) 1000 MG CAPS Take 1,000 mg by mouth in the morning and at bedtime.   Yes [provider]  omeprazole  (PRILOSEC) 20 MG capsule Take 1 capsule (20 mg total) by mouth daily. 02/13/23  Yes Tory Freiberg, NP  OZEMPIC , 2 MG/DOSE, 8 MG/3ML SOPN INJECT 2 MG INTO THE SKIN ONCE A WEEK 11/05/23  Yes Emilie Harden, MD    No Known Allergies  Patient Active Problem List   Diagnosis Date Noted   Spinal stenosis at L4-L5 level 06/08/2022   Lumbar spondylosis 05/21/2022   Lumbar radiculopathy 08/05/2021   Degeneration of thoracic intervertebral disc 06/29/2020   Decreased calculated GFR 08/08/2017   Bilateral adhesive capsulitis of shoulders 07/26/2017   Erectile dysfunction 09/15/2013   Nonspecific abnormal electrocardiogram (ECG) (EKG) 06/10/2013   Poorly controlled type 2 diabetes mellitus with circulatory disorder (HCC) 09/12/2012   Coronary artery disease 07/14/2011   History of coronary artery bypass surgery 07/14/2011   Dyslipidemia associated with type 2 diabetes mellitus (HCC) 02/23/2009   Hyperlipidemia 02/23/2009   Hypertension associated with diabetes (HCC) 02/23/2009    Past Medical History:  Diagnosis Date   Anemia    Arthritis    Cancer (HCC)    skin cancer on left posterior neck - excised   Cataract    Coronary artery disease    post CABG x3 in 2007   Diabetes mellitus    type 2   GERD (gastroesophageal reflux disease)    Headache    no current problem   Hearing loss    some hearing loss bilateral - no hearing aids   History of kidney stones 1990   passed stone   Hyperlipidemia  Hypertension    Obesity     Past Surgical History:  Procedure Laterality Date   CARDIAC CATHETERIZATION  EF= 45-50%   EF= 45-50% -- Three-vessel coronary artery disease with high-grade lesions in the proximal left anterior descending artery and multiple lesions in the right coronary -- Low normal ejection fraction -- Stable exertional angina -- Jules Oar, M.D. [facility]   COLONOSCOPY  09/26/2021   CORONARY ARTERY BYPASS GRAFT  02/19/2006    x3 (left internal mammary artery to LAD, left radial artery to posterior descending, saphenous vein graft to first diagonal),  endoscopic vein harvest right leg -- SURGEON:  Milon Aloe. Luna Salinas, M.D.   HEMI-MICRODISCECTOMY LUMBAR LAMINECTOMY LEVEL 1 Left 06/08/2022   Procedure: Micro hemi laminectomy Lumbar Five-Sacral One left;  Surgeon: Orvan Blanch, MD;  Location: [facility] OR;  Service: Orthopedics;  Laterality: Left;  90 mins 3 C-Bed   HERNIA REPAIR     at 68 y/o   SHOULDER ARTHROSCOPY Right 2007   tripple bypass     UPPER GASTROINTESTINAL ENDOSCOPY  09/26/2021    Social History   Socioeconomic History   Marital status: Married    Spouse name: Nancyann Aye   Number of children: 3   Years of education: Not on file   Highest education level: Bachelor's degree (e.g., BA, AB, BS)  Occupational History   Occupation: retired  Tobacco Use   Smoking status: Never   Smokeless tobacco: Never  Vaping Use   Vaping status: Never Used  Substance and Sexual Activity   Alcohol use: Yes    Alcohol/week: 1.0 standard drink of alcohol    Types: 1 Standard drinks or equivalent per week    Comment: 1 drink monthly   Drug use: Never   Sexual activity: Yes  Other Topics Concern   Not on file  Social History Narrative   Lives with wife and 1 cat/2025   Social Drivers of Health   Financial Resource Strain: Low Risk  (12/11/2023)   Overall Financial Resource Strain (CARDIA)    Difficulty of Paying Living Expenses: Not hard at all  Food Insecurity: No Food Insecurity (12/11/2023)   Hunger Vital Sign    Worried About Running Out of Food in the Last Year: Never true    Ran Out of Food in the Last Year: Never true  Transportation Needs: No Transportation Needs (12/11/2023)   PRAPARE - Administrator, Civil Service (Medical): No    Lack of Transportation (Non-Medical): No  Physical Activity: Sufficiently Active (12/11/2023)   Exercise Vital Sign    Days of Exercise per Week: 3 days    Minutes of Exercise per Session: 60 min  Recent Concern: Physical Activity - Insufficiently Active (12/11/2023)   Exercise Vital Sign     Days of Exercise per Week: 2 days    Minutes of Exercise per Session: 60 min  Stress: No Stress Concern Present (12/11/2023)   [employer] of Occupational Health - Occupational Stress Questionnaire    Feeling of Stress : Not at all  Social Connections: Moderately Integrated (12/11/2023)   Social Connection and Isolation Panel [NHANES]    Frequency of Communication with Friends and Family: More than three times a week    Frequency of Social Gatherings with Friends and Family: Twice a week    Attends Religious Services: Never    Database administrator or Organizations: Yes    Attends Banker Meetings: 1 to 4 times per year    Marital Status: Married  Intimate Partner Violence: Not At Risk (12/11/2023)   Humiliation, Afraid, Rape, and Kick questionnaire    Fear of Current or Ex-Partner: No    Emotionally Abused: No    Physically Abused: No    Sexually Abused: No    Family History  Problem Relation Age of Onset   Heart disease Mother    Colon cancer Mother        28   Hypertension Mother    Cancer Father        type unknown   Cancer - Lung Father    Heart attack Maternal Grandfather 66   Kidney disease Maternal Aunt    Diabetes Maternal Uncle    Colon polyps Neg Hx    Esophageal cancer Neg Hx    Rectal cancer Neg Hx    Stomach cancer Neg Hx      Review of Systems  Constitutional: Negative.  Negative for chills and fever.  HENT: Negative.  Negative for congestion and sore throat.   Respiratory: Negative.  Negative for cough and shortness of breath.   Cardiovascular: Negative.  Negative for chest pain and palpitations.  Gastrointestinal:  Negative for abdominal pain, diarrhea, nausea and vomiting.  Genitourinary: Negative.  Negative for hematuria.  Skin: Negative.  Negative for rash.  Neurological: Negative.  Negative for dizziness and headaches.    Vitals:   12/31/23 1115  BP: 114/64  Pulse: 79  Temp: 98.1 F (36.7 C)  SpO2: 96%    Physical  Exam Vitals reviewed.  Constitutional:      Appearance: Normal appearance.  HENT:     Head: Normocephalic.  Eyes:     Extraocular Movements: Extraocular movements intact.     Pupils: Pupils are equal, round, and reactive to light.  Cardiovascular:     Rate and Rhythm: Normal rate and regular rhythm.     Pulses: Normal pulses.     Heart sounds: Normal heart sounds.  Pulmonary:     Effort: Pulmonary effort is normal.     Breath sounds: Normal breath sounds.  Musculoskeletal:     Cervical back: No tenderness.  Lymphadenopathy:     Cervical: No cervical adenopathy.  Skin:    General: Skin is warm and dry.  Neurological:     Mental Status: He is alert and oriented to person, place, and time.  Psychiatric:        Mood and Affect: Mood normal.        Behavior: Behavior normal.      ASSESSMENT & PLAN: A total of 42 minutes was spent with the patient and counseling/coordination of care regarding preparing for this visit, review of most recent office visit notes, review of multiple chronic medical conditions and their management, review of all medications, review of most recent bloodwork results, review of health maintenance items, education on nutrition, prognosis, documentation, and need for follow up.   Problem List Items Addressed This Visit       Cardiovascular and Mediastinum   Hypertension associated with diabetes (HCC)   BP Readings from Last 3 Encounters:  12/31/23 114/64  09/11/23 130/70  09/10/23 125/77  Well-controlled hypertension Continue losartan  50 mg daily Lab Results  Component Value Date   HGBA1C 6.5 (A) 09/11/2023  A1c repeated today.  Diabetes seems to be well-controlled. Continues Ozempic  at 2 mg weekly along with daily metformin  1000 mg twice a day and daily Jardiance  25 mg Cardiovascular risks associated with hypertension and diabetes discussed Diet and nutrition discussed Blood work and urine done  today Follow-up in 6 months        Relevant  Orders   Comprehensive metabolic panel with GFR   CBC with Differential/Platelet   Hemoglobin A1c   Lipid panel   Microalbumin / creatinine urine ratio     Endocrine   Dyslipidemia associated with type 2 diabetes mellitus (HCC) - Primary   Stable chronic conditions Continue Jardiance , metformin , and Ozempic  as detailed above Continue atorvastatin  40 mg daily      Relevant Orders   Comprehensive metabolic panel with GFR   CBC with Differential/Platelet   Hemoglobin A1c   Lipid panel   Microalbumin / creatinine urine ratio   Patient Instructions  Health Maintenance After Age 69 After age 35, you are at a higher risk for certain long-term diseases and infections as well as injuries from falls. Falls are a major cause of broken bones and head injuries in people who are older than age 68. Getting regular preventive care can help to keep you healthy and well. Preventive care includes getting regular testing and making lifestyle changes as recommended by your health care provider. Talk with your health care provider about: Which screenings and tests you should have. A screening is a test that checks for a disease when you have no symptoms. A diet and exercise plan that is right for you. What should I know about screenings and tests to prevent falls? Screening and testing are the best ways to find a health problem early. Early diagnosis and treatment give you the best chance of managing medical conditions that are common after age 44. Certain conditions and lifestyle choices may make you more likely to have a fall. Your health care provider may recommend: Regular vision checks. Poor vision and conditions such as cataracts can make you more likely to have a fall. If you wear glasses, make sure to get your prescription updated if your vision changes. Medicine review. Work with your health care provider to regularly review all of the medicines you are taking, including over-the-counter medicines.  Ask your health care provider about any side effects that may make you more likely to have a fall. Tell your health care provider if any medicines that you take make you feel dizzy or sleepy. Strength and balance checks. Your health care provider may recommend certain tests to check your strength and balance while standing, walking, or changing positions. Foot health exam. Foot pain and numbness, as well as not wearing proper footwear, can make you more likely to have a fall. Screenings, including: Osteoporosis screening. Osteoporosis is a condition that causes the bones to get weaker and break more easily. Blood pressure screening. Blood pressure changes and medicines to control blood pressure can make you feel dizzy. Depression screening. You may be more likely to have a fall if you have a fear of falling, feel depressed, or feel unable to do activities that you used to do. Alcohol use screening. Using too much alcohol can affect your balance and may make you more likely to have a fall. Follow these instructions at home: Lifestyle Do not drink alcohol if: Your health care provider tells you not to drink. If you drink alcohol: Limit how much you have to: 0-1 drink a day for women. 0-2 drinks a day for men. Know how much alcohol is in your drink. In the [place] one drink equals one 12 oz bottle of beer (355 mL), one 5 oz glass of wine (148 mL), or one 1 oz glass of hard liquor (44  mL). Do not use any products that contain nicotine or tobacco. These products include cigarettes, chewing tobacco, and vaping devices, such as e-cigarettes. If you need help quitting, ask your health care provider. Activity  Follow a regular exercise program to stay fit. This will help you maintain your balance. Ask your health care provider what types of exercise are appropriate for you. If you need a cane or walker, use it as recommended by your health care provider. Wear supportive shoes that have nonskid  soles. Safety  Remove any tripping hazards, such as rugs, cords, and clutter. Install safety equipment such as grab bars in bathrooms and safety rails on stairs. Keep rooms and walkways well-lit. General instructions Talk with your health care provider about your risks for falling. Tell your health care provider if: You fall. Be sure to tell your health care provider about all falls, even ones that seem minor. You feel dizzy, tiredness (fatigue), or off-balance. Take over-the-counter and prescription medicines only as told by your health care provider. These include supplements. Eat a healthy diet and maintain a healthy weight. A healthy diet includes low-fat dairy products, low-fat (lean) meats, and fiber from whole grains, beans, and lots of fruits and vegetables. Stay current with your vaccines. Schedule regular health, dental, and eye exams. Summary Having a healthy lifestyle and getting preventive care can help to protect your health and wellness after age 43. Screening and testing are the best way to find a health problem early and help you avoid having a fall. Early diagnosis and treatment give you the best chance for managing medical conditions that are more common for people who are older than age 64. Falls are a major cause of broken bones and head injuries in people who are older than age 46. Take precautions to prevent a fall at home. Work with your health care provider to learn what changes you can make to improve your health and wellness and to prevent falls. This information is not intended to replace advice given to you by your health care provider. Make sure you discuss any questions you have with your health care provider. Document Revised: 11/29/2020 Document Reviewed: 11/29/2020 Elsevier Patient Education  2024 Elsevier Inc.      Maryagnes Small, MD [facility]

## 2023-12-31 NOTE — Assessment & Plan Note (Signed)
 BP Readings from Last 3 Encounters:  12/31/23 114/64  09/11/23 130/70  09/10/23 125/77  Well-controlled hypertension Continue losartan  50 mg daily Lab Results  Component Value Date   HGBA1C 6.5 (A) 09/11/2023  A1c repeated today.  Diabetes seems to be well-controlled. Continues Ozempic  at 2 mg weekly along with daily metformin  1000 mg twice a day and daily Jardiance  25 mg Cardiovascular risks associated with hypertension and diabetes discussed Diet and nutrition discussed Blood work and urine done today Follow-up in 6 months

## 2024-01-01 ENCOUNTER — Ambulatory Visit: Payer: Self-pay | Admitting: Emergency Medicine

## 2024-01-17 DIAGNOSIS — I739 Peripheral vascular disease, unspecified: Secondary | ICD-10-CM | POA: Diagnosis not present

## 2024-01-17 DIAGNOSIS — M19071 Primary osteoarthritis, right ankle and foot: Secondary | ICD-10-CM | POA: Diagnosis not present

## 2024-01-17 DIAGNOSIS — E1142 Type 2 diabetes mellitus with diabetic polyneuropathy: Secondary | ICD-10-CM | POA: Diagnosis not present

## 2024-01-17 DIAGNOSIS — B351 Tinea unguium: Secondary | ICD-10-CM | POA: Diagnosis not present

## 2024-01-17 DIAGNOSIS — G629 Polyneuropathy, unspecified: Secondary | ICD-10-CM | POA: Diagnosis not present

## 2024-01-17 DIAGNOSIS — M19072 Primary osteoarthritis, left ankle and foot: Secondary | ICD-10-CM | POA: Diagnosis not present

## 2024-01-17 DIAGNOSIS — B353 Tinea pedis: Secondary | ICD-10-CM | POA: Diagnosis not present

## 2024-01-20 ENCOUNTER — Encounter: Payer: Self-pay | Admitting: Internal Medicine

## 2024-01-21 ENCOUNTER — Encounter: Payer: Self-pay | Admitting: Internal Medicine

## 2024-01-21 ENCOUNTER — Ambulatory Visit: Payer: Medicare PPO | Admitting: Internal Medicine

## 2024-01-21 VITALS — BP 112/70 | HR 81 | Ht 70.0 in | Wt 197.6 lb

## 2024-01-21 DIAGNOSIS — E785 Hyperlipidemia, unspecified: Secondary | ICD-10-CM | POA: Diagnosis not present

## 2024-01-21 DIAGNOSIS — E1159 Type 2 diabetes mellitus with other circulatory complications: Secondary | ICD-10-CM

## 2024-01-21 DIAGNOSIS — Z7985 Long-term (current) use of injectable non-insulin antidiabetic drugs: Secondary | ICD-10-CM | POA: Diagnosis not present

## 2024-01-21 DIAGNOSIS — E663 Overweight: Secondary | ICD-10-CM

## 2024-01-21 DIAGNOSIS — Z7984 Long term (current) use of oral hypoglycemic drugs: Secondary | ICD-10-CM | POA: Diagnosis not present

## 2024-01-21 MED ORDER — OZEMPIC (2 MG/DOSE) 8 MG/3ML ~~LOC~~ SOPN: 2.0000 mg | PEN_INJECTOR | SUBCUTANEOUS | 2 refills | Status: AC

## 2024-01-21 MED ORDER — EMPAGLIFLOZIN 25 MG PO TABS: 25.0000 mg | ORAL_TABLET | Freq: Every day | ORAL | 3 refills | Status: AC

## 2024-01-21 NOTE — Progress Notes (Addendum)
 Patient ID: [name] Love, male   DOB: [birth year], 68 y.o.   MRN: [id number]   HPI: [name] Love is a 68 y.o.-year-old male, initially referred by his PCP, Dr. Purcell, returning for follow-up for DM2, dx in ~2008, non-insulin -dependent, uncontrolled, with long term complications (CAD - s/p CABG in 2007, CKD, PN, DR).  Last visit 4.5 months ago.  Interim history: No increased urination, blurry vision, nausea, chest pain.  He has several local trips planned for this summer.  Reviewing HbA1c levels: Lab Results  Component Value Date   HGBA1C 7.1 (H) 12/31/2023   HGBA1C 6.5 (A) 09/11/2023   HGBA1C 6.9 (H) 05/29/2023   HGBA1C 6.4 03/09/2023   HGBA1C 6.8 (A) 10/19/2022   HGBA1C 6.8 Repeated and verified X2. (H) 07/06/2022   HGBA1C 6.7 (A) 06/19/2022   HGBA1C 6.6 (H) 06/01/2022   HGBA1C 6.8 (A) 02/06/2022   HGBA1C 7.1 (A) 09/19/2021   HGBA1C 7.1 (A) 05/16/2021   HGBA1C 7.3 (A) 01/03/2021   HGBA1C 6.4 (A) 09/14/2020   HGBA1C 6.9 (A) 05/12/2020   HGBA1C 6.9 (A) 12/02/2019  He got a steroid inj in shoulder >> 05/10/2017.  Pt is on a regimen of: - Metformin  1000 mg 2x a day, with meals >> ER formulation -improved diarrhea >> 1000 mg 2x a day - Farxiga  10 mg >> Jardiance  25 before b'fast - Trulicity  1.5 mg weekly-started 05/2017 >> 3 mg weekly >> Ozempic  1 >> 2 mg weekly We stopped glipizide  in 11/2019.  He checks his sugars more than 4 times a day with his freestyle libre CGM - [employer]- we reviewed the traces on his phone and he sent the screenshots to me:    Prev.:  Prev.:   Lowest sugar was 66 (Libre) >> ...  90 >> 80s; it is unclear at which level he has hypoglycemia awareness. Highest sugar was 298 >> ... 200s >> 300s.  Pt's meals are: - Breakfast: bagel, OJ >> cereals >> occas. Cereals, or eggs  >> stopped OJ  - Lunch: soup or sandwich or leftovers - Dinner: meat, veggies - Snacks: not daily  -+ Mild CKD, last BUN/creatinine:  Lab Results   Component Value Date   BUN 16 12/31/2023   BUN 9 05/29/2023   CREATININE 1.03 12/31/2023   CREATININE 0.85 05/29/2023   Lab Results  Component Value Date   MICRALBCREAT 40.4 (H) 12/31/2023  On losartan  50.  -+ HL; last set of lipids: Lab Results  Component Value Date   CHOL 77 12/31/2023   HDL 32.90 (L) 12/31/2023   LDLCALC 2 12/31/2023   LDLDIRECT 21.0 07/06/2022   TRIG 212.0 (H) 12/31/2023   CHOLHDL 2 12/31/2023  On Lipitor, omega-3 fatty acids 3000 mg 2x a day.  - last eye exam was 03/22/2023: + DR.  Dr. Maude Bring.   -+ Numbness and tingling in his feet.  Last foot exam 01/17/2024 by Dr. Zan:   On ASA 81.  He has a history of a slightly elevated TSH, which normalized at last visit: Lab Results  Component Value Date   TSH 1.67 09/19/2021   TSH 5.170 (H) 06/24/2020   TSH 2.918 05/22/2013   TSH 1.843 09/12/2012   He had Covid19 02/26/2023, right after he returned from a 3-week excursion/cruise to [place], [place] and [place]. He still has back pain. Previously had steroid injections for this, then spinal stenosis surgery, then a nerve ablation >> did not work very well.  ROS: + See HPI  I reviewed pt's medications, allergies, PMH,  social hx, family hx, and changes were documented in the history of present illness. Otherwise, unchanged from my initial visit note.  Past Medical History:  Diagnosis Date   Anemia    Arthritis    Cancer (HCC)    skin cancer on left posterior neck - excised   Cataract    Coronary artery disease    post CABG x3 in 2007   Diabetes mellitus    type 2   GERD (gastroesophageal reflux disease)    Headache    no current problem   Hearing loss    some hearing loss bilateral - no hearing aids   History of kidney stones 1990   passed stone   Hyperlipidemia    Hypertension    Obesity    Past Surgical History:  Procedure Laterality Date   CARDIAC CATHETERIZATION  EF= 45-50%   EF= 45-50% -- Three-vessel coronary artery disease  with high-grade lesions in the proximal left anterior descending artery and multiple lesions in the right coronary -- Low normal ejection fraction -- Stable exertional angina -- Toribio Fuel, M.D. [facility]   COLONOSCOPY  09/26/2021   CORONARY ARTERY BYPASS GRAFT  02/19/2006    x3 (left internal mammary artery to LAD, left radial artery to posterior descending, saphenous vein graft to first diagonal), endoscopic vein harvest right leg -- SURGEON:  Elspeth BROCKS. Kerrin, M.D.   HEMI-MICRODISCECTOMY LUMBAR LAMINECTOMY LEVEL 1 Left 06/08/2022   Procedure: Micro hemi laminectomy Lumbar Five-Sacral One left;  Surgeon: Duwayne Purchase, MD;  Location: [facility] OR;  Service: Orthopedics;  Laterality: Left;  90 mins 3 C-Bed   HERNIA REPAIR     at 68 y/o   SHOULDER ARTHROSCOPY Right 2007   tripple bypass     UPPER GASTROINTESTINAL ENDOSCOPY  09/26/2021   Social History   Socioeconomic History   Marital status: Married    Spouse name: Not on file   Number of children: 3  Social Needs  Occupational History   unemployed  Tobacco Use   Smoking status: Never Smoker   Smokeless tobacco: Never Used  Substance and Sexual Activity   Alcohol use: Yes    Alcohol/week:     Types: 1-2 per mo   Drug use: No      Other Topics      Social History Narrative      Current Outpatient Medications on File Prior to Visit  Medication Sig Dispense Refill   acetaminophen  (TYLENOL ) 500 MG tablet Take 1,000 mg by mouth every 8 (eight) hours as needed for moderate pain.     aspirin  EC 81 MG tablet Take 1 tablet (81 mg total) by mouth daily. Swallow whole.     atorvastatin  (LIPITOR) 40 MG tablet Take 1 tablet by mouth once daily 90 tablet 1   budesonide -formoterol  (SYMBICORT ) 80-4.5 MCG/ACT inhaler Inhale 2 puffs into the lungs in the morning and at bedtime. 1 each 3   empagliflozin  (JARDIANCE ) 25 MG TABS tablet Take 1 tablet (25 mg total) by mouth daily before breakfast. 90 tablet 3   fluticasone  (FLONASE ) 50 MCG/ACT  nasal spray Place 1 spray into both nostrils daily. 16 g 5   losartan  (COZAAR ) 50 MG tablet Take 1 tablet by mouth once daily 90 tablet 3   Magnesium  Glycinate 665 MG CAPS Take 1 tablet by mouth in the morning and at bedtime.     metFORMIN  (GLUCOPHAGE ) 1000 MG tablet Take 1 tablet (1,000 mg total) by mouth 2 (two) times daily with a meal. 180 tablet 3  Omega-3 Fatty Acids (FISH OIL ) 1000 MG CAPS Take 1,000 mg by mouth in the morning and at bedtime.     omeprazole  (PRILOSEC) 20 MG capsule Take 1 capsule (20 mg total) by mouth daily. 90 capsule 0   OZEMPIC , 2 MG/DOSE, 8 MG/3ML SOPN INJECT 2 MG INTO THE SKIN ONCE A WEEK 9 mL 0   No current facility-administered medications on file prior to visit.   No Known Allergies Family History  Problem Relation Age of Onset   Heart disease Mother    Colon cancer Mother        15   Hypertension Mother    Cancer Father        type unknown   Cancer - Lung Father    Heart attack Maternal Grandfather 70   Kidney disease Maternal Aunt    Diabetes Maternal Uncle    Colon polyps Neg Hx    Esophageal cancer Neg Hx    Rectal cancer Neg Hx    Stomach cancer Neg Hx    PE: BP 112/70   Pulse 81   Ht 5' 10 (1.778 m)   Wt 197 lb 9.6 oz (89.6 kg)   SpO2 98%   BMI 28.35 kg/m  Wt Readings from Last 10 Encounters:  01/21/24 197 lb 9.6 oz (89.6 kg)  12/31/23 197 lb (89.4 kg)  12/11/23 199 lb (90.3 kg)  09/11/23 199 lb 12.8 oz (90.6 kg)  09/10/23 203 lb (92.1 kg)  08/28/23 195 lb 12.8 oz (88.8 kg)  07/09/23 198 lb 9.6 oz (90.1 kg)  06/19/23 194 lb 6.4 oz (88.2 kg)  05/29/23 197 lb (89.4 kg)  03/09/23 193 lb (87.5 kg)   Constitutional: overweight, in NAD Eyes: EOMI, no exophthalmos ENT: no thyromegaly, no cervical lymphadenopathy Cardiovascular: RRR, No MRG Respiratory: CTA B Musculoskeletal: no deformities Skin: no rashes Neurological: no tremor with outstretched hands  ASSESSMENT: 1. DM2, non-insulin -dependent, uncontrolled, without  complications - CAD - s/p CABG in 2007 - CKD - PN - DR  2. HL  3. Overweight  PLAN:  1. Patient with longstanding, fairly well-controlled, type 2 diabetes, on oral antidiabetic regimen with metformin  and SGLT2 inhibitor along with weekly GLP-1 receptor agonist,, with an HbA1c 6.5% at last visit, improved, but the more recent HbA1c obtained at the beginning of this month, which was 7.1%, elevated.  At last visit, sugars appears to be well-controlled overnight especially in the second half of the night but they were increasing after breakfast and they remained elevated, fluctuating above the upper limit of the target range of 180 mg/dL and improving again after midnight.  We discussed about trying to improve breakfast.  He was eating different foods for this meal and I advised him to try to look at what he was eating and eliminate the foods that were increasing his blood sugars.  I did not recommend a change in regimen at that time but we did discuss about possible options: Switching from Ozempic  to Mounjaro, adding back glipizide , or mealtime insulin  before breakfast. CGM interpretation: -At today's visit, we reviewed his CGM downloads: It appears that 82% of values are in target range (goal >70%), while 18%  are higher than 180 (goal <25%), and 0% are lower than 70 (goal <4%).  The projected HbA1c for the next 3 months (GMI) is 6.9%. -Reviewing the CGM trends, sugars appear to be increasing less after breakfast compared to before, and they are more fluctuating later in the day, but mostly within the target range.  This is despite the fact that he was traveling recently.  For now, I did not recommend a change in regimen.  I refilled his Jardiance . - I suggested to:  Patient Instructions  Please continue:  - Metformin  ER 1000 mg 2x a day, with meals - Jardiance  25 mg before b'fast - Ozempic  2 mg weekly   Please return in 4-6 months.  - advised to check sugars at different times of the day - 4x  a day, rotating check times - advised for yearly eye exams >> he is UTD - he has significant onychodystrophy and decreased pulses in his legs.  He also has some trophic changes in his feet.  I recommended to see podiatry.  He did not see them yet.  I again recommended [facility] at today's visit. - I we will see him back in 4 to 6 months  2.  Hyperlipidemia - Latest lipid panel showed a low LDL, slightly low HDL, and triglycerides elevated: Lab Results  Component Value Date   CHOL 77 12/31/2023   HDL 32.90 (L) 12/31/2023   LDLCALC 2 12/31/2023   LDLDIRECT 21.0 07/06/2022   TRIG 212.0 (H) 12/31/2023   CHOLHDL 2 12/31/2023  -He continues Lipitor 40 mg daily and omega-3 fatty acids-no side effects  3.  Overweight - continue SGLT 2 inhibitor and GLP-1 receptor agonist which should also help with weight loss - He gained 6 pounds before last visit and lost 2 pounds since last visit  Lela Fendt, MD PhD [facility] Endocrin[facility]

## 2024-01-21 NOTE — Addendum Note (Signed)
 Addended by: TRIXIE FILE on: 01/21/2024 01:56 PM   Modules accepted: Level of Service

## 2024-01-21 NOTE — Patient Instructions (Addendum)
 Please continue:  - Metformin  1000 mg 2x a day, with meals - Jardiance  25 mg before b'fast - Ozempic  2 mg weekly   Please return in 4-6 months.

## 2024-01-25 DIAGNOSIS — B351 Tinea unguium: Secondary | ICD-10-CM | POA: Diagnosis not present

## 2024-02-04 ENCOUNTER — Other Ambulatory Visit: Payer: Self-pay | Admitting: Family

## 2024-02-04 DIAGNOSIS — M545 Low back pain, unspecified: Secondary | ICD-10-CM | POA: Diagnosis not present

## 2024-02-06 DIAGNOSIS — L603 Nail dystrophy: Secondary | ICD-10-CM | POA: Diagnosis not present

## 2024-02-07 DIAGNOSIS — E113293 Type 2 diabetes mellitus with mild nonproliferative diabetic retinopathy without macular edema, bilateral: Secondary | ICD-10-CM | POA: Diagnosis not present

## 2024-02-07 LAB — HM DIABETES EYE EXAM

## 2024-02-13 ENCOUNTER — Telehealth: Payer: Self-pay | Admitting: Pharmacist

## 2024-02-13 NOTE — Progress Notes (Signed)
 Pharmacy Quality Measure Review  This patient is appearing on a report for being at risk of failing the adherence measure for cholesterol (statin) medications this calendar year.   Medication: Atorvastatin  40 mg Last fill date: 02/04/2024 for 90 day supply  Insurance report was not up to date. No action needed at this time.    Darrelyn Drum, PharmD, BCPS, CPP Clinical Pharmacist Practitioner [facility] [phone]

## 2024-02-28 DIAGNOSIS — E1142 Type 2 diabetes mellitus with diabetic polyneuropathy: Secondary | ICD-10-CM | POA: Diagnosis not present

## 2024-02-28 DIAGNOSIS — M19071 Primary osteoarthritis, right ankle and foot: Secondary | ICD-10-CM | POA: Diagnosis not present

## 2024-02-28 DIAGNOSIS — G629 Polyneuropathy, unspecified: Secondary | ICD-10-CM | POA: Diagnosis not present

## 2024-02-28 DIAGNOSIS — I739 Peripheral vascular disease, unspecified: Secondary | ICD-10-CM | POA: Diagnosis not present

## 2024-02-28 DIAGNOSIS — B351 Tinea unguium: Secondary | ICD-10-CM | POA: Diagnosis not present

## 2024-02-28 DIAGNOSIS — B353 Tinea pedis: Secondary | ICD-10-CM | POA: Diagnosis not present

## 2024-02-28 DIAGNOSIS — M19072 Primary osteoarthritis, left ankle and foot: Secondary | ICD-10-CM | POA: Diagnosis not present

## 2024-03-07 DIAGNOSIS — B351 Tinea unguium: Secondary | ICD-10-CM | POA: Diagnosis not present

## 2024-03-07 DIAGNOSIS — M19072 Primary osteoarthritis, left ankle and foot: Secondary | ICD-10-CM | POA: Diagnosis not present

## 2024-03-07 DIAGNOSIS — G629 Polyneuropathy, unspecified: Secondary | ICD-10-CM | POA: Diagnosis not present

## 2024-03-07 DIAGNOSIS — I739 Peripheral vascular disease, unspecified: Secondary | ICD-10-CM | POA: Diagnosis not present

## 2024-03-07 DIAGNOSIS — M19071 Primary osteoarthritis, right ankle and foot: Secondary | ICD-10-CM | POA: Diagnosis not present

## 2024-03-07 DIAGNOSIS — B353 Tinea pedis: Secondary | ICD-10-CM | POA: Diagnosis not present

## 2024-03-07 DIAGNOSIS — E1142 Type 2 diabetes mellitus with diabetic polyneuropathy: Secondary | ICD-10-CM | POA: Diagnosis not present

## 2024-03-10 ENCOUNTER — Ambulatory Visit: Admitting: Internal Medicine

## 2024-03-10 ENCOUNTER — Encounter: Payer: Self-pay | Admitting: Internal Medicine

## 2024-03-10 VITALS — BP 124/64 | HR 80 | Temp 97.8°F | Ht 70.0 in | Wt 196.0 lb

## 2024-03-10 DIAGNOSIS — J310 Chronic rhinitis: Secondary | ICD-10-CM | POA: Diagnosis not present

## 2024-03-10 DIAGNOSIS — J4520 Mild intermittent asthma, uncomplicated: Secondary | ICD-10-CM | POA: Diagnosis not present

## 2024-03-10 DIAGNOSIS — R053 Chronic cough: Secondary | ICD-10-CM

## 2024-03-10 DIAGNOSIS — R0982 Postnasal drip: Secondary | ICD-10-CM

## 2024-03-10 MED ORDER — BUDESONIDE-FORMOTEROL FUMARATE 80-4.5 MCG/ACT IN AERO
2.0000 | INHALATION_SPRAY | Freq: Two times a day (BID) | RESPIRATORY_TRACT | 3 refills | Status: AC | PRN
Start: 2024-03-10 — End: ?

## 2024-03-10 MED ORDER — FLUTICASONE PROPIONATE 50 MCG/ACT NA SUSP: 1.0000 | Freq: Every day | NASAL | 11 refills | Status: AC

## 2024-03-10 NOTE — Patient Instructions (Signed)
 It was a pleasure to see you today!  Please schedule follow up with myself in 1 year.  If my schedule is not open yet, we will contact you with a reminder closer to that time. Please call [phone] if you haven't heard from us  a month before, and always call us  sooner if issues or concerns arise. You can also send us  a message through MyChart, but but aware that this is not to be used for urgent issues and it may take up to 5-7 days to receive a reply. Please be aware that you will likely be able to view your results before I have a chance to respond to them. Please give us  5 business days to respond to any non-urgent results.   Continue the flonase  for your post nasal drainage.   Continue symbicort  2 puffs twice a day - as needed.   Because you do not have daily symptoms, using this inhaler on an as needed basis may be sufficient. I recommend starting to use the inhaler daily as prescribed if you have symptoms of asthma such as chest tightness, wheezing, coughing, shortness of breath. You should also start using it if you have exposure to a sick contact, worsening allergies, or any other trigger for your asthma. I recommend you keep using it even after your respiratory symptoms resolve for 3-4 days. The goal of this therapy is to prevent your symptoms from becoming a flare severe enough to require steroids like prednisone.   Please call our office if using this inhaler on an as needed basis is not sufficient. You might need to be seen sooner than our scheduled follow up.

## 2024-03-10 NOTE — Progress Notes (Signed)
 [name] Love    [id number]    05-25-1956  Primary Care Physician:[name], Emil Schanz, MD Date of Appointment: 03/10/2024 Established Patient Visit  Chief complaint:   Chief Complaint  Patient presents with   Cough    Cough is better.     HPI: [name] Love is a 68 y.o. man with chronic cough related to rhinitis and mild intermittent asthma.  Interval Updates: Here for follow up after starting flonase , intermittent ICS-LABA with symbicort .  Feels the flonase  nasal spray is helping and using as needed.  No interval flares of asthma, no URI.  I have reviewed the patient's family social and past medical history and updated as appropriate.   Past Medical History:  Diagnosis Date   Anemia    Arthritis    Cancer (HCC)    skin cancer on left posterior neck - excised   Cataract    Coronary artery disease    post CABG x3 in 2007   Diabetes mellitus    type 2   GERD (gastroesophageal reflux disease)    Headache    no current problem   Hearing loss    some hearing loss bilateral - no hearing aids   History of kidney stones 1990   passed stone   Hyperlipidemia    Hypertension    Obesity     Past Surgical History:  Procedure Laterality Date   CARDIAC CATHETERIZATION  EF= 45-50%   EF= 45-50% -- Three-vessel coronary artery disease with high-grade lesions in the proximal left anterior descending artery and multiple lesions in the right coronary -- Low normal ejection fraction -- Stable exertional angina -- Toribio Fuel, M.D. [facility]   COLONOSCOPY  09/26/2021   CORONARY ARTERY BYPASS GRAFT  02/19/2006    x3 (left internal mammary artery to LAD, left radial artery to posterior descending, saphenous vein graft to first diagonal), endoscopic vein harvest right leg -- SURGEON:  Elspeth BROCKS. Kerrin, M.D.   HEMI-MICRODISCECTOMY LUMBAR LAMINECTOMY LEVEL 1 Left 06/08/2022   Procedure: Micro hemi laminectomy Lumbar Five-Sacral One left;  Surgeon:  Duwayne Purchase, MD;  Location: [facility] OR;  Service: Orthopedics;  Laterality: Left;  90 mins 3 C-Bed   HERNIA REPAIR     at 68 y/o   SHOULDER ARTHROSCOPY Right 2007   tripple bypass     UPPER GASTROINTESTINAL ENDOSCOPY  09/26/2021    Family History  Problem Relation Age of Onset   Heart disease Mother    Colon cancer Mother        76   Hypertension Mother    Cancer Father        type unknown   Cancer - Lung Father    Heart attack Maternal Grandfather 81   Kidney disease Maternal Aunt    Diabetes Maternal Uncle    Colon polyps Neg Hx    Esophageal cancer Neg Hx    Rectal cancer Neg Hx    Stomach cancer Neg Hx     Social History   Occupational History   Occupation: retired  Tobacco Use   Smoking status: Never   Smokeless tobacco: Never  Vaping Use   Vaping status: Never Used  Substance and Sexual Activity   Alcohol use: Yes    Alcohol/week: 1.0 standard drink of alcohol    Types: 1 Standard drinks or equivalent per week    Comment: 1 drink monthly   Drug use: Never   Sexual activity: Yes     Physical Exam: Blood  pressure 124/64, pulse 80, temperature 97.8 F (36.6 C), temperature source Oral, height 5' 10 (1.778 m), weight 196 lb (88.9 kg), SpO2 96%.  Gen:      No acute distress ENT:  no nasal polyps, mucus membranes moist Lungs:    No increased respiratory effort, symmetric chest wall excursion, clear to auscultation bilaterally, no wheezes or crackles CV:         Regular rate and rhythm; no murmurs, rubs, or gallops.  No pedal edema   Data Reviewed: Imaging: I have personally reviewed the chest xray Nov 2024  PFTs:   Labs: Lab Results  Component Value Date   NA 139 12/31/2023   K 3.7 12/31/2023   CO2 25 12/31/2023   GLUCOSE 183 (H) 12/31/2023   BUN 16 12/31/2023   CREATININE 1.03 12/31/2023   CALCIUM  9.5 12/31/2023   GFR 75.19 12/31/2023   GFRNONAA >60 06/01/2022   Lab Results  Component Value Date   WBC 5.7 12/31/2023   HGB 14.6 12/31/2023    HCT 44.0 12/31/2023   MCV 79.8 12/31/2023   PLT 176.0 12/31/2023    Immunization status: Immunization History  Administered Date(s) Administered   Fluad Quad(high Dose 65+) 06/26/2022, 05/15/2023   Influenza Split 04/23/2012, 05/01/2013   Influenza, Seasonal, Injecte, Preservative Fre 04/21/2014   Influenza,inj,Quad PF,6+ Mos 07/27/2016, 03/21/2017, 05/27/2018, 05/03/2019, 05/12/2020, 05/16/2021   Influenza-Unspecified 04/21/2014, 05/03/2019, 05/12/2020   PFIZER(Purple Top)SARS-COV-2 Vaccination 10/09/2019, 11/03/2019, 05/04/2020   PNEUMOCOCCAL CONJUGATE-20 07/04/2021   Pfizer Covid-19 Vaccine Bivalent Booster 77yrs & up 06/26/2022   Pneumococcal Polysaccharide-23 07/10/2018   Tdap 06/21/2017, 05/12/2020   Zoster Recombinant(Shingrix) 07/04/2021    External Records Personally Reviewed: internal medicine  Assessment:  Chronic cough related to: Chronic rhinitis Mild intermittent asthma   Plan/Recommendations: Continue the flonase  for your post nasal drainage.   Continue symbicort  2 puffs twice a day - as needed.   Return to Care: Return in about 1 year (around 03/10/2025).   Verdon Gore, MD Pulmonary and Critical Care Medicine [facility] Office:[phone]

## 2024-04-20 DIAGNOSIS — E1159 Type 2 diabetes mellitus with other circulatory complications: Secondary | ICD-10-CM | POA: Diagnosis not present

## 2024-04-23 ENCOUNTER — Other Ambulatory Visit: Payer: Self-pay | Admitting: Gastroenterology

## 2024-04-24 ENCOUNTER — Other Ambulatory Visit: Payer: Self-pay | Admitting: Gastroenterology

## 2024-04-25 ENCOUNTER — Telehealth: Payer: Self-pay | Admitting: Gastroenterology

## 2024-04-25 NOTE — Telephone Encounter (Signed)
 Inbound call from patient stating pharmacy needs new authorization for medication omeprazole . Patient would like to know if he has to continue medication or if he can stop since pharmacy requesting new authorization  Requesting a call back  Please advise  Thank you

## 2024-04-25 NOTE — Telephone Encounter (Signed)
 Left a detailed message explaining to patient that Dr. Stacia does want him to continue omeprazole  due to his history of gastric ulcers. Also, informed him on voicemail that it was not authorized at the pharmacy because he is due for a follow up visit with Dr. Stacia and I will send refills to his pharmacy when he schedules an appt.

## 2024-04-29 ENCOUNTER — Encounter: Payer: Self-pay | Admitting: Gastroenterology

## 2024-04-29 MED ORDER — OMEPRAZOLE 20 MG PO CPDR: 20.0000 mg | DELAYED_RELEASE_CAPSULE | Freq: Every day | ORAL | 0 refills | Status: DC

## 2024-04-30 DIAGNOSIS — M25552 Pain in left hip: Secondary | ICD-10-CM | POA: Diagnosis not present

## 2024-05-21 DIAGNOSIS — M4316 Spondylolisthesis, lumbar region: Secondary | ICD-10-CM | POA: Diagnosis not present

## 2024-05-21 DIAGNOSIS — M7062 Trochanteric bursitis, left hip: Secondary | ICD-10-CM | POA: Diagnosis not present

## 2024-05-21 DIAGNOSIS — M4186 Other forms of scoliosis, lumbar region: Secondary | ICD-10-CM | POA: Diagnosis not present

## 2024-05-21 DIAGNOSIS — M545 Low back pain, unspecified: Secondary | ICD-10-CM | POA: Diagnosis not present

## 2024-05-21 DIAGNOSIS — M25552 Pain in left hip: Secondary | ICD-10-CM | POA: Diagnosis not present

## 2024-05-29 DIAGNOSIS — M47816 Spondylosis without myelopathy or radiculopathy, lumbar region: Secondary | ICD-10-CM | POA: Diagnosis not present

## 2024-06-04 ENCOUNTER — Encounter: Payer: Self-pay | Admitting: Pharmacist

## 2024-06-04 NOTE — Progress Notes (Signed)
 Pharmacy Quality Measure Review  This patient is appearing on a report for being at risk of failing the adherence measure for cholesterol (statin) medications this calendar year.   Medication: atorvastatin  Last fill date: 05/01/24 for 90 day supply  Insurance report was not up to date. No action needed at this time.   Darrelyn Drum, PharmD, BCPS, CPP Clinical Pharmacist Practitioner [facility] [phone]

## 2024-06-05 DIAGNOSIS — M19071 Primary osteoarthritis, right ankle and foot: Secondary | ICD-10-CM | POA: Diagnosis not present

## 2024-06-05 DIAGNOSIS — L821 Other seborrheic keratosis: Secondary | ICD-10-CM | POA: Diagnosis not present

## 2024-06-05 DIAGNOSIS — M19072 Primary osteoarthritis, left ankle and foot: Secondary | ICD-10-CM | POA: Diagnosis not present

## 2024-06-05 DIAGNOSIS — Z85828 Personal history of other malignant neoplasm of skin: Secondary | ICD-10-CM | POA: Diagnosis not present

## 2024-06-05 DIAGNOSIS — D1801 Hemangioma of skin and subcutaneous tissue: Secondary | ICD-10-CM | POA: Diagnosis not present

## 2024-06-05 DIAGNOSIS — Z08 Encounter for follow-up examination after completed treatment for malignant neoplasm: Secondary | ICD-10-CM | POA: Diagnosis not present

## 2024-06-05 DIAGNOSIS — L814 Other melanin hyperpigmentation: Secondary | ICD-10-CM | POA: Diagnosis not present

## 2024-06-05 DIAGNOSIS — B351 Tinea unguium: Secondary | ICD-10-CM | POA: Diagnosis not present

## 2024-06-05 DIAGNOSIS — G629 Polyneuropathy, unspecified: Secondary | ICD-10-CM | POA: Diagnosis not present

## 2024-06-05 DIAGNOSIS — E1142 Type 2 diabetes mellitus with diabetic polyneuropathy: Secondary | ICD-10-CM | POA: Diagnosis not present

## 2024-06-05 DIAGNOSIS — I739 Peripheral vascular disease, unspecified: Secondary | ICD-10-CM | POA: Diagnosis not present

## 2024-06-05 DIAGNOSIS — B353 Tinea pedis: Secondary | ICD-10-CM | POA: Diagnosis not present

## 2024-06-23 ENCOUNTER — Other Ambulatory Visit

## 2024-06-23 ENCOUNTER — Encounter: Payer: Self-pay | Admitting: Internal Medicine

## 2024-06-23 ENCOUNTER — Ambulatory Visit: Admitting: Internal Medicine

## 2024-06-23 VITALS — BP 120/64 | HR 78 | Ht 70.0 in | Wt 195.4 lb

## 2024-06-23 DIAGNOSIS — Z7985 Long-term (current) use of injectable non-insulin antidiabetic drugs: Secondary | ICD-10-CM

## 2024-06-23 DIAGNOSIS — Z7984 Long term (current) use of oral hypoglycemic drugs: Secondary | ICD-10-CM | POA: Diagnosis not present

## 2024-06-23 DIAGNOSIS — E663 Overweight: Secondary | ICD-10-CM

## 2024-06-23 DIAGNOSIS — E1159 Type 2 diabetes mellitus with other circulatory complications: Secondary | ICD-10-CM

## 2024-06-23 DIAGNOSIS — E785 Hyperlipidemia, unspecified: Secondary | ICD-10-CM

## 2024-06-23 LAB — POCT GLYCOSYLATED HEMOGLOBIN (HGB A1C): Hemoglobin A1C: 6.8 % — AB (ref 4.0–5.6)

## 2024-06-23 MED ORDER — GLIPIZIDE ER 2.5 MG PO TB24: 2.5000 mg | ORAL_TABLET | Freq: Every day | ORAL | 3 refills | Status: AC

## 2024-06-23 NOTE — Patient Instructions (Addendum)
 Please continue:  - Metformin  ER 1000 mg 2x a day, with meals - Jardiance  25 mg before b'fast - Ozempic  2 mg weekly   Please start: - Glipizide  ER 2.5 mg daily before the first meal of the day  Please return in 4 months.

## 2024-06-23 NOTE — Progress Notes (Signed)
 Patient ID: [name] Love, male   DOB: [birth year], 68 y.o.   MRN: [id number]   HPI: [name] Love is a 68 y.o.-year-old male, initially referred by his PCP, Dr. Purcell, returning for follow-up for DM2, dx in ~2008, non-insulin -dependent, uncontrolled, with long term complications (CAD - s/p CABG in 2007, CKD, PN, DR).  Last visit 6 months ago.  Interim history: No increased urination, blurry vision, nausea, chest pain.    Reviewing HbA1c levels: Lab Results  Component Value Date   HGBA1C 7.1 (H) 12/31/2023   HGBA1C 6.5 (A) 09/11/2023   HGBA1C 6.9 (H) 05/29/2023   HGBA1C 6.4 03/09/2023   HGBA1C 6.8 (A) 10/19/2022   HGBA1C 6.8 Repeated and verified X2. (H) 07/06/2022   HGBA1C 6.7 (A) 06/19/2022   HGBA1C 6.6 (H) 06/01/2022   HGBA1C 6.8 (A) 02/06/2022   HGBA1C 7.1 (A) 09/19/2021   HGBA1C 7.1 (A) 05/16/2021   HGBA1C 7.3 (A) 01/03/2021   HGBA1C 6.4 (A) 09/14/2020   HGBA1C 6.9 (A) 05/12/2020   HGBA1C 6.9 (A) 12/02/2019  He got a steroid inj in shoulder >> 05/10/2017.  Pt is on a regimen of: - Metformin  1000 mg 2x a day, with meals >> ER formulation -improved diarrhea >> 1000 mg 2x a day - Farxiga  10 mg >> Jardiance  25 before b'fast - Trulicity  1.5 mg weekly-started 05/2017 >> 3 mg weekly >> Ozempic  1 >> 2 mg weekly We stopped glipizide  in 11/2019.  He checks his sugars more than 4 times a day with his freestyle libre CGM - [facility]:  Previously:    Prev.:  Lowest sugar was 66 (Libre) >> ...  90 >> 80s; it is unclear at which level he has hypoglycemia awareness. Highest sugar was 298 >> ... 200s >> 300s.  Pt's meals are: - Breakfast: bagel, OJ >> cereals >> occas. Cereals, or eggs  >> stopped OJ  - Lunch: soup or sandwich or leftovers - Dinner: meat, veggies - Snacks: not daily  -+ Mild CKD, last BUN/creatinine:  Lab Results  Component Value Date   BUN 16 12/31/2023   BUN 9 05/29/2023   CREATININE 1.03 12/31/2023   CREATININE 0.85 05/29/2023    Lab Results  Component Value Date   MICRALBCREAT 40.4 (H) 12/31/2023  On losartan  50.  -+ HL; last set of lipids: Lab Results  Component Value Date   CHOL 77 12/31/2023   HDL 32.90 (L) 12/31/2023   LDLCALC 2 12/31/2023   LDLDIRECT 21.0 07/06/2022   TRIG 212.0 (H) 12/31/2023   CHOLHDL 2 12/31/2023  On Lipitor, omega-3 fatty acids 3000 mg 2x a day.  - last eye exam was 02/07/2024: + DR.  Dr. Maude Bring.   -+ Numbness and tingling in his feet.  Last foot exam 06/05/2024 by Dr. Zan:   On ASA 81.  He has a history of a slightly elevated TSH, which normalized: Lab Results  Component Value Date   TSH 1.67 09/19/2021   TSH 5.170 (H) 06/24/2020   TSH 2.918 05/22/2013   TSH 1.843 09/12/2012   He had Covid19 02/26/2023, right after he returned from a 3-week excursion/cruise to [place], [place] and [place]. He still has back pain. Previously had steroid injections for this, then spinal stenosis surgery, then a nerve ablation >> did not work very well.  ROS: + See HPI  I reviewed pt's medications, allergies, PMH, social hx, family hx, and changes were documented in the history of present illness. Otherwise, unchanged from my initial visit note.  Past Medical History:  Diagnosis Date   Anemia    Arthritis    Cancer (HCC)    skin cancer on left posterior neck - excised   Cataract    Coronary artery disease    post CABG x3 in 2007   Diabetes mellitus    type 2   GERD (gastroesophageal reflux disease)    Headache    no current problem   Hearing loss    some hearing loss bilateral - no hearing aids   History of kidney stones 1990   passed stone   Hyperlipidemia    Hypertension    Obesity    Past Surgical History:  Procedure Laterality Date   CARDIAC CATHETERIZATION  EF= 45-50%   EF= 45-50% -- Three-vessel coronary artery disease with high-grade lesions in the proximal left anterior descending artery and multiple lesions in the right coronary -- Low normal ejection  fraction -- Stable exertional angina -- Toribio Fuel, M.D. [facility]   COLONOSCOPY  09/26/2021   CORONARY ARTERY BYPASS GRAFT  02/19/2006    x3 (left internal mammary artery to LAD, left radial artery to posterior descending, saphenous vein graft to first diagonal), endoscopic vein harvest right leg -- SURGEON:  Elspeth BROCKS. Kerrin, M.D.   HEMI-MICRODISCECTOMY LUMBAR LAMINECTOMY LEVEL 1 Left 06/08/2022   Procedure: Micro hemi laminectomy Lumbar Five-Sacral One left;  Surgeon: Duwayne Purchase, MD;  Location: [facility] OR;  Service: Orthopedics;  Laterality: Left;  90 mins 3 C-Bed   HERNIA REPAIR     at 68 y/o   SHOULDER ARTHROSCOPY Right 2007   tripple bypass     UPPER GASTROINTESTINAL ENDOSCOPY  09/26/2021   Social History   Socioeconomic History   Marital status: Married    Spouse name: Not on file   Number of children: 3  Social Needs  Occupational History   unemployed  Tobacco Use   Smoking status: Never Smoker   Smokeless tobacco: Never Used  Substance and Sexual Activity   Alcohol use: Yes    Alcohol/week:     Types: 1-2 per mo   Drug use: No      Other Topics      Social History Narrative      Current Outpatient Medications on File Prior to Visit  Medication Sig Dispense Refill   acetaminophen  (TYLENOL ) 500 MG tablet Take 1,000 mg by mouth every 8 (eight) hours as needed for moderate pain.     aspirin  EC 81 MG tablet Take 1 tablet (81 mg total) by mouth daily. Swallow whole.     atorvastatin  (LIPITOR) 40 MG tablet Take 1 tablet by mouth once daily 90 tablet 3   budesonide -formoterol  (SYMBICORT ) 80-4.5 MCG/ACT inhaler Inhale 2 puffs into the lungs 2 (two) times daily as needed (for asthma). 1 each 3   empagliflozin  (JARDIANCE ) 25 MG TABS tablet Take 1 tablet (25 mg total) by mouth daily before breakfast. 90 tablet 3   fluticasone  (FLONASE ) 50 MCG/ACT nasal spray Place 1 spray into both nostrils daily. 16 g 11   losartan  (COZAAR ) 50 MG tablet Take 1 tablet by mouth once daily  90 tablet 3   Magnesium  Glycinate 665 MG CAPS Take 1 tablet by mouth in the morning and at bedtime.     metFORMIN  (GLUCOPHAGE ) 1000 MG tablet Take 1 tablet (1,000 mg total) by mouth 2 (two) times daily with a meal. 180 tablet 3   Omega-3 Fatty Acids (FISH OIL ) 1000 MG CAPS Take 1,000 mg by mouth in the morning and at bedtime.  omeprazole  (PRILOSEC) 20 MG capsule Take 1 capsule (20 mg total) by mouth daily. 90 capsule 0   Semaglutide , 2 MG/DOSE, (OZEMPIC , 2 MG/DOSE,) 8 MG/3ML SOPN Inject 2 mg into the skin once a week. 9 mL 2   No current facility-administered medications on file prior to visit.   No Known Allergies Family History  Problem Relation Age of Onset   Heart disease Mother    Colon cancer Mother        36   Hypertension Mother    Cancer Father        type unknown   Cancer - Lung Father    Heart attack Maternal Grandfather 36   Kidney disease Maternal Aunt    Diabetes Maternal Uncle    Colon polyps Neg Hx    Esophageal cancer Neg Hx    Rectal cancer Neg Hx    Stomach cancer Neg Hx    PE: BP 120/64   Pulse 78   Ht 5' 10 (1.778 m)   Wt 195 lb 6.4 oz (88.6 kg)   SpO2 96%   BMI 28.04 kg/m  Wt Readings from Last 10 Encounters:  06/23/24 195 lb 6.4 oz (88.6 kg)  03/10/24 196 lb (88.9 kg)  01/21/24 197 lb 9.6 oz (89.6 kg)  12/31/23 197 lb (89.4 kg)  12/11/23 199 lb (90.3 kg)  09/11/23 199 lb 12.8 oz (90.6 kg)  09/10/23 203 lb (92.1 kg)  08/28/23 195 lb 12.8 oz (88.8 kg)  07/09/23 198 lb 9.6 oz (90.1 kg)  06/19/23 194 lb 6.4 oz (88.2 kg)   Constitutional: overweight, in NAD Eyes: EOMI, no exophthalmos ENT: no thyromegaly, no cervical lymphadenopathy Cardiovascular: RRR, No MRG Respiratory: CTA B Musculoskeletal: no deformities Skin: no rashes Neurological: no tremor with outstretched hands  ASSESSMENT: 1. DM2, non-insulin -dependent, uncontrolled, without complications - CAD - s/p CABG in 2007 - CKD - PN - DR  2. HL  3. Overweight  PLAN:  1.  Patient with longstanding, fairly well-controlled type 2 diabetes, on oral antidiabetic regimen with metformin  and SGLT2 inhibitor along with weekly GLP-1 receptor agonist, with a higher HbA1c at last visit, 7.1%, increased from 6.5%.  Sugars were increasing less after breakfast compared to the previous visit, but they were more fluctuating later in the day, with still most of the values within the target range, possibly due to traveling.  We did not change his regimen at that time.  CGM interpretation: -At today's visit, we reviewed his CGM downloads: It appears that 74% of values are in target range (goal >70%), while 26% are higher than 180 (goal <25%), and 0% are lower than 70 (goal <4%).  The calculated average blood sugar is 159.  The projected HbA1c for the next 3 months (GMI) is 7.1%. -Reviewing the CGM trends, sugars appear to be improved overnight and they are increasing after waking up, sometimes even after his shower.  They remain elevated after each meal.  We discussed about adding a low-dose glipizide  extended release before the first meal of the day, and otherwise continue the same regimen.  I am hoping we can stop this after the holidays. - I suggested to:  Patient Instructions  Please continue:  - Metformin  ER 1000 mg 2x a day, with meals - Jardiance  25 mg before b'fast - Ozempic  2 mg weekly   Please start: - Glipizide  ER 2.5 mg daily before the first meal of the day  Please return in 4 months.  - we checked his HbA1c: 6.8% (lower) -  advised to check sugars at different times of the day - 4x a day, rotating check times - advised for yearly eye exams >> he is UTD -He has significant onychodystrophy and decreased pulses in his legs.  Also, some atrophic changes of the skin in his feet.  At last visit, I recommended to see podiatry and he saw Dr. Zan. He continues to follow-up with her, last visit in 05/2024. Previously. -He had a slightly elevated ACR at last check.  Will  recheck this today. - return to clinic in 4 months  2.  Hyperlipidemia - Latest lipid panel reviewed: LDL at goal, high triglycerides and low HDL: Lab Results  Component Value Date   CHOL 77 12/31/2023   HDL 32.90 (L) 12/31/2023   LDLCALC 2 12/31/2023   LDLDIRECT 21.0 07/06/2022   TRIG 212.0 (H) 12/31/2023   CHOLHDL 2 12/31/2023  - On Lipitor 40 mg daily and omega-3 fatty acids, without side effects  3.  Overweight - continue SGLT 2 inhibitor and GLP-1 receptor agonist which should also help with weight loss - He gained 6 pounds before last visit and lost 2 pounds since last visit  Lela Fendt, MD PhD [facility]

## 2024-06-23 NOTE — Addendum Note (Signed)
 Addended by: CLEOTILDE ROLIN RAMAN on: 06/23/2024 12:02 PM   Modules accepted: Orders

## 2024-06-24 ENCOUNTER — Ambulatory Visit: Payer: Self-pay | Admitting: Internal Medicine

## 2024-06-24 ENCOUNTER — Encounter: Payer: Self-pay | Admitting: Gastroenterology

## 2024-06-24 ENCOUNTER — Other Ambulatory Visit (INDEPENDENT_AMBULATORY_CARE_PROVIDER_SITE_OTHER)

## 2024-06-24 ENCOUNTER — Ambulatory Visit: Admitting: Gastroenterology

## 2024-06-24 VITALS — BP 134/60 | HR 79 | Ht 70.5 in | Wt 195.4 lb

## 2024-06-24 DIAGNOSIS — D509 Iron deficiency anemia, unspecified: Secondary | ICD-10-CM

## 2024-06-24 DIAGNOSIS — Z8711 Personal history of peptic ulcer disease: Secondary | ICD-10-CM

## 2024-06-24 DIAGNOSIS — K529 Noninfective gastroenteritis and colitis, unspecified: Secondary | ICD-10-CM

## 2024-06-24 LAB — IBC + FERRITIN
Ferritin: 32 ng/mL (ref 22.0–322.0)
Iron: 78 ug/dL (ref 42–165)
Saturation Ratios: 15.1 % — ABNORMAL LOW (ref 20.0–50.0)
TIBC: 515.2 ug/dL — ABNORMAL HIGH (ref 250.0–450.0)
Transferrin: 368 mg/dL — ABNORMAL HIGH (ref 212.0–360.0)

## 2024-06-24 LAB — MICROALBUMIN / CREATININE URINE RATIO
Creatinine, Urine: 83 mg/dL (ref 20–320)
Microalb Creat Ratio: 52 mg/g{creat} — ABNORMAL HIGH (ref <30)
Microalb, Ur: 4.3 mg/dL

## 2024-06-24 NOTE — Patient Instructions (Addendum)
 Your provider has requested that you go to the basement level for lab work before leaving today. Press B on the elevator. The lab is located at the first door on the left as you exit the elevator.  Please purchase the following medications over the counter and take as directed: Metamucil.  Please discuss stopping losartan  with your primary care doctor at your next visit.    _______________________________________________________  If your blood pressure at your visit was 140/90 or greater, please contact your primary care physician to follow up on this.  _______________________________________________________  If you are age 66 or older, your body mass index should be between 23-30. Your Body mass index is 27.64 kg/m. If this is out of the aforementioned range listed, please consider follow up with your Primary Care Provider.  If you are age 65 or younger, your body mass index should be between 19-25. Your Body mass index is 27.64 kg/m. If this is out of the aformentioned range listed, please consider follow up with your Primary Care Provider.   ________________________________________________________  The [facility] providers would like to encourage you to use MYCHART to communicate with providers for non-urgent requests or questions.  Due to long hold times on the telephone, sending your provider a message by [facility] may be a faster and more efficient way to get a response.  Please allow 48 business hours for a response.  Please remember that this is for non-urgent requests.  _______________________________________________________  [facility] is using a team-based approach to care.  Your team is made up of your doctor and two to three APPS. Our APPS (Nurse Practitioners and Physician Assistants) work with your physician to ensure care continuity for you. They are fully qualified to address your health concerns and develop a treatment plan. They communicate directly with your  gastroenterologist to care for you. Seeing the Advanced Practice Practitioners on your physician's team can help you by facilitating care more promptly, often allowing for earlier appointments, access to diagnostic testing, procedures, and other specialty referrals.

## 2024-06-24 NOTE — Progress Notes (Signed)
 Discussed the use of AI scribe software for clinical note transcription with the patient, who gave verbal consent to proceed.  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  He does wish to discuss chronic diarrhea today. He experiences chronic diarrhea characterized by frequent loose stools that are never solid. This issue has persisted for a long time and has worsened with age. He typically has one bowel movement every morning, but sometimes experiences two or three more throughout the day. Occasionally, he experiences urgency, requiring him to 'run to get there.' He denies abdominal pain.  No blood in the stool.  No problems with constipation. He uses loperamide, a non-name brand anti-diarrheal, to manage his symptoms when they become excessive.   He is currently taking omeprazole  for a history of gastritis and ulcers.. He also takes aspirin  due to his history of coronary artery disease status post CABG.   He has a history of low iron levels, which he attributes to blood donation, but he has stopped donating blood as it seemed to correlate with low iron levels.  He has a history of gastritis and small ulcers, which have previously contributed to iron deficiency. His iron levels were low during his last check in April.       EGD March 2023 - The examined portions of the nasopharynx, oropharynx and larynx were normal.  - Normal esophagus.  - Non-bleeding gastric ulcers with no stigmata of bleeding. Biopsied. Suspect these are NSAID-related. This is a likely source of iron deficiency anemia.  - Erythematous mucosa in the gastric fundus and antrum. Biopsied.  - Normal examined duodenum.  Colonoscopy March 2023 - Patchy mild erythematous mucosa in the distal  transverse colon. Biopsied.  - The examined portion of the ileum was normal.  - The distal rectum and anal verge are normal on retroflexion view.   EGD May 2023 - The examined portions of the nasopharynx, oropharynx and larynx were normal.  - Normal esophagus.  - Erosive gastropathy with no bleeding and no stigmata of recent bleeding. Biopsied.  - Normal examined duodenum.  EGD Jan 2024 - The procedure was aborted due to presence of food in the stomach.  - Normal esophagus.  - A large amount of food (residue) in the stomach.  - No specimens collected.  Component Ref Range & Units (hover) 5 mo ago (12/31/23) 1 yr ago (05/29/23) 1 yr ago (03/09/23) 1 yr ago (11/27/22) 1 yr ago (09/22/22) 1 yr ago (07/06/22) 2 yr ago (06/01/22)  WBC 5.7 9.4 5.9 4.9 5.1 5.6 5.3  RBC 5.51 5.24 5.26 5.43 5.27 4.46 5.24 R  Hemoglobin 14.6 14.7 14.8 14.9 14.6 12.6 Low  15.2  HCT 44.0 44.9 45.2 44.5 44.0 37.9 Low  45.2  MCV 79.8 85.7 85.9 81.9 83.5 84.9 86.3 R  MCHC 33.2 32.7 32.8 33.4 33.2 33.2 33.6  RDW 16.7 High  14.2 14.7 15.3 14.7 14.6 13.5  Platelets 176.0 218.0 192.0 173.0 172.0 208.0 188 R  Neutrophils Relative % 61.8 82.4 High  66.3 66.3 70.6 65.8   Lymphocytes Relative 24.0 10.1 Low  22.3 23.3 18.5 24.3   Monocytes Relative 7.4 4.8 7.5 7.6 8.3 7.7   Eosinophils Relative 6.0 High  2.0 3.2 2.2 1.9 1.5   Basophils Relative 0.8 0.7 0.7 0.6 0.7 0.7  Neutro Abs 3.5 7.8 High  3.9 3.2 3.6 3.7   Lymphs Abs 1.4 0.9 1.3 1.1 0.9 1.4   Monocytes Absolute 0.4 0.4 0.4 0.4 0.4 0.4   Eosinophils Absolute 0.3 0.2 0.2 0.1 0.1 0.1   Basophils Absolute 0.0 0.1 0.0 0.0 0.0 0.0    Component Ref Range & Units (hover) 7 mo ago (11/13/23) 1 yr ago (03/09/23) 1 yr ago (11/27/22) 1 yr ago (09/22/22) 2 yr ago (04/17/22) 2 yr ago (01/10/22) 2 yr ago (07/06/21)  Iron 51 114 67 49 66 67 19 Low   Transferrin 406.0 High  333.0 328.0 387.0 High  339.0 382.0 High  407.0 High   Saturation Ratios 9.0 Low  24.5 14.6 Low  9.0 Low   13.9 Low  12.5 Low  3.3 Low   Ferritin 11.5 Low  76.0 38.2 15.5 Low  30.7 24.9 4.1 Low   TIBC 568.4 High  466.2 High  459.2 High  541.8 High  474.6 High  534.8 High  569.8 High     HPI 2023 [name] Love is a very pleasant 68 year old male with a history of diabetes and coronary artery disease s/p CABG who is referred to us  by Mr. Emil Schaumann for further evaluation of anemia.  He was found to have a hgb of 10.6 with an MCV of 67 on Dec 12, which was down from Hgb 13.3 with MCV 78 in Nov 2020.  A subsequent iron panel was consistent with severe iron deficiency, with a ferritin 4.1, iron sat 3.3%, serum iron 19, TIBC 570.  He denies ever being told he has had low blood or iron in the past.  He does donate blood on occasion, with the last occasion about 4 months ago.  He tried to donate last month but was denied because of low iron.  He says this is the first time he has ever been denied blood transfusion.   He denies any history of overt GI blood loss such as melena or hematochezia.  He does report chronic loose stools, usually 1-3 bowel movements/day.  Stools are not typically solid.  No abdominal pain.  No constipation.    He denies any chronic upper GI symptoms such as frequent heartburn, acid regurgitation, dysphagia, nausea/vomiting, dyspepsia or early satiety.  He has lost about 20 lbs in the past year, which he attributes to starting Trulicity    He does report feeling tired more than usual and some possible shortness of breath with exertion.  He had a CABG in 2007 and denies any heart problems or symptoms since.  He takes a baby aspirin  but no other blood thinners/antiplatelet agents.    Past Medical History:  Diagnosis Date   Anemia    Arthritis    Cancer (HCC)    skin cancer on left posterior neck - excised   Cataract    Coronary artery disease    post CABG x3 in 2007   Diabetes mellitus    type 2   GERD (gastroesophageal reflux disease)    Headache    no  current problem   Hearing loss    some hearing loss bilateral - no hearing aids   History of kidney stones 1990   passed stone   Hyperlipidemia    Hypertension    Obesity      Past Surgical History:  Procedure Laterality Date   CARDIAC CATHETERIZATION  EF= 45-50%   EF= 45-50% -- Three-vessel coronary artery disease with high-grade lesions in the proximal  left anterior descending artery and multiple lesions in the right coronary -- Low normal ejection fraction -- Stable exertional angina -- Toribio Fuel, M.D. [facility]   COLONOSCOPY  09/26/2021   CORONARY ARTERY BYPASS GRAFT  02/19/2006    x3 (left internal mammary artery to LAD, left radial artery to posterior descending, saphenous vein graft to first diagonal), endoscopic vein harvest right leg -- SURGEON:  Elspeth BROCKS. Kerrin, M.D.   HEMI-MICRODISCECTOMY LUMBAR LAMINECTOMY LEVEL 1 Left 06/08/2022   Procedure: Micro hemi laminectomy Lumbar Five-Sacral One left;  Surgeon: Duwayne Purchase, MD;  Location: [facility] OR;  Service: Orthopedics;  Laterality: Left;  90 mins 3 C-Bed   HERNIA REPAIR     at 68 y/o   SHOULDER ARTHROSCOPY Right 2007   tripple bypass     UPPER GASTROINTESTINAL ENDOSCOPY  09/26/2021   Family History  Problem Relation Age of Onset   Heart disease Mother    Colon cancer Mother        38   Hypertension Mother    Cancer Father        type unknown   Cancer - Lung Father    Heart attack Maternal Grandfather 3   Kidney disease Maternal Aunt    Diabetes Maternal Uncle    Colon polyps Neg Hx    Esophageal cancer Neg Hx    Rectal cancer Neg Hx    Stomach cancer Neg Hx    Social History   Tobacco Use   Smoking status: Never   Smokeless tobacco: Never  Vaping Use   Vaping status: Never Used  Substance Use Topics   Alcohol use: Yes    Alcohol/week: 1.0 standard drink of alcohol    Types: 1 Standard drinks or equivalent per week    Comment: 1 drink monthly   Drug use: Never   Current Outpatient Medications   Medication Sig Dispense Refill   acetaminophen  (TYLENOL ) 500 MG tablet Take 1,000 mg by mouth every 8 (eight) hours as needed for moderate pain.     aspirin  EC 81 MG tablet Take 1 tablet (81 mg total) by mouth daily. Swallow whole.     atorvastatin  (LIPITOR) 40 MG tablet Take 1 tablet by mouth once daily 90 tablet 3   budesonide -formoterol  (SYMBICORT ) 80-4.5 MCG/ACT inhaler Inhale 2 puffs into the lungs 2 (two) times daily as needed (for asthma). 1 each 3   empagliflozin  (JARDIANCE ) 25 MG TABS tablet Take 1 tablet (25 mg total) by mouth daily before breakfast. 90 tablet 3   fluticasone  (FLONASE ) 50 MCG/ACT nasal spray Place 1 spray into both nostrils daily. 16 g 11   glipiZIDE  (GLUCOTROL  XL) 2.5 MG 24 hr tablet Take 1 tablet (2.5 mg total) by mouth daily with breakfast. 90 tablet 3   losartan  (COZAAR ) 50 MG tablet Take 1 tablet by mouth once daily 90 tablet 3   Magnesium  Glycinate 665 MG CAPS Take 1 tablet by mouth in the morning and at bedtime.     metFORMIN  (GLUCOPHAGE ) 1000 MG tablet Take 1 tablet (1,000 mg total) by mouth 2 (two) times daily with a meal. 180 tablet 3   Omega-3 Fatty Acids (FISH OIL ) 1000 MG CAPS Take 1,000 mg by mouth in the morning and at bedtime.     omeprazole  (PRILOSEC) 20 MG capsule Take 1 capsule (20 mg total) by mouth daily. 90 capsule 0   Semaglutide , 2 MG/DOSE, (OZEMPIC , 2 MG/DOSE,) 8 MG/3ML SOPN Inject 2 mg into the skin once a week. 9 mL 2   No  current facility-administered medications for this visit.   No Known Allergies   Review of Systems: All systems reviewed and negative except where noted in HPI.    No results found.  Physical Exam: BP 134/60   Pulse 79   Ht 5' 10.5 (1.791 m)   Wt 195 lb 6.4 oz (88.6 kg)   BMI 27.64 kg/m  Constitutional: Pleasant,well-developed, Caucasian male in no acute distress. HEENT: Normocephalic and atraumatic. Conjunctivae are normal. No scleral icterus. Neurological: Alert and oriented to person place and  time. Skin: Skin is warm and dry. No rashes noted. Psychiatric: Normal mood and affect. Behavior is normal.  CBC    Component Value Date/Time   WBC 5.7 12/31/2023 1143   RBC 5.51 12/31/2023 1143   HGB 14.6 12/31/2023 1143   HGB 13.3 06/10/2019 1143   HCT 44.0 12/31/2023 1143   HCT 41.8 06/10/2019 1143   PLT 176.0 12/31/2023 1143   PLT 205 06/10/2019 1143   MCV 79.8 12/31/2023 1143   MCV 78 (L) 06/10/2019 1143   MCH 29.0 06/01/2022 1136   MCHC 33.2 12/31/2023 1143   RDW 16.7 (H) 12/31/2023 1143   RDW 15.0 06/10/2019 1143   LYMPHSABS 1.4 12/31/2023 1143   LYMPHSABS 1.3 06/10/2019 1143   MONOABS 0.4 12/31/2023 1143   EOSABS 0.3 12/31/2023 1143   EOSABS 0.1 06/10/2019 1143   BASOSABS 0.0 12/31/2023 1143   BASOSABS 0.0 06/10/2019 1143    CMP     Component Value Date/Time   NA 139 12/31/2023 1143   NA 138 06/24/2020 0930   K 3.7 12/31/2023 1143   CL 101 12/31/2023 1143   CO2 25 12/31/2023 1143   GLUCOSE 183 (H) 12/31/2023 1143   BUN 16 12/31/2023 1143   BUN 16 06/24/2020 0930   CREATININE 1.03 12/31/2023 1143   CREATININE 1.48 (H) 05/29/2017 1422   CALCIUM  9.5 12/31/2023 1143   PROT 7.7 12/31/2023 1143   PROT 7.1 06/24/2020 0930   ALBUMIN 4.7 12/31/2023 1143   ALBUMIN 4.7 06/24/2020 0930   AST 18 12/31/2023 1143   ALT 24 12/31/2023 1143   ALKPHOS 50 12/31/2023 1143   BILITOT 0.6 12/31/2023 1143   BILITOT 0.5 06/24/2020 0930   GFRNONAA >60 06/01/2022 1136   GFRNONAA 51 (L) 05/29/2017 1422   GFRAA 87 06/24/2020 0930   GFRAA 59 (L) 05/29/2017 1422       Latest Ref Rng & Units 12/31/2023   11:43 AM 05/29/2023    1:40 PM 03/09/2023   12:40 PM  CBC EXTENDED  WBC 4.0 - 10.5 K/uL 5.7  9.4  5.9   RBC 4.22 - 5.81 Mil/uL 5.51  5.24  5.26   Hemoglobin 13.0 - 17.0 g/dL 85.3  85.2  85.1   HCT 39.0 - 52.0 % 44.0  44.9  45.2   Platelets 150.0 - 400.0 K/uL 176.0  218.0  192.0   NEUT# 1.4 - 7.7 K/uL 3.5  7.8  3.9   Lymph# 0.7 - 4.0 K/uL 1.4  0.9  1.3       ASSESSMENT  AND PLAN:  68 year old male with longstanding loose stools, typically only 1-3 bowel movements per day.  No other concerning symptoms such as abdominal pain, blood in the stool or weight loss.  High degree of suspicion for medication side effect causing his loose stool consistency.  He is on multiple medications that have been associated with diarrhea or medication induced microscopic colitis.  Losartan  has been associated with chronic diarrhea, and may be able to  be easily replaced with another antihypertensive.  I would recommend he discuss this with his PCP at his upcoming visit next month.  If losartan  is not able to be stopped or substituted, will consider holding metformin  for a while to see if this helps.  I do think that Metamucil or other fiber supplement would help regardless to improve his stool consistency.  We may also consider adding a bile acid binder if unable to identify a culprit medication causing his diarrhea.  Will check for celiac serologies today.  Chronic diarrhea Likely medication-related, particularly losartan  and metformin .  Differential includes celiac disease and microscopic colitis, although mild nature of symptoms makes these less likely. Aspirin  and omeprazole  may also be associated with microscopic colitis.  - Start Metamucil daily. - Discuss with primary care about switching losartan  to another medication or stopping altogether to see if diarrhea improves. - If losartan  not switched, continue Metamucil for one month and reassess. - Consider cholestyramine if Metamucil ineffective. - Ordered celiac serologies (TTG and IgA).  Iron deficiency anemia Likely secondary to gastritis and gastric ulcers, exacerbated by blood donation. Most recent hgb normal, but iron stores low. - Ordered iron panel. - Continue omeprazole . - Celiac serologies as above.  History of gastritis and gastric ulcers Managed with omeprazole . No significant reflux symptoms. Omeprazole  continued to  reduce bleeding risk. - Continue omeprazole  as long as he is on aspirin .  Recording duration: 12 minutes     Rosabel Sermeno E. Stacia, MD [facility]   [name], [place], [name]

## 2024-06-25 LAB — TISSUE TRANSGLUTAMINASE, IGA: (tTG) Ab, IgA: <1 U/mL

## 2024-06-25 LAB — IGA: Immunoglobulin A: 168 mg/dL (ref 70–320)

## 2024-06-26 ENCOUNTER — Ambulatory Visit: Payer: Self-pay | Admitting: Gastroenterology

## 2024-07-01 ENCOUNTER — Ambulatory Visit: Admitting: Emergency Medicine

## 2024-07-01 ENCOUNTER — Ambulatory Visit: Payer: Self-pay | Admitting: Emergency Medicine

## 2024-07-01 VITALS — BP 118/80 | HR 81 | Temp 97.7°F | Ht 70.5 in | Wt 200.0 lb

## 2024-07-01 DIAGNOSIS — Z130 Encounter for screening for diseases of the blood and blood-forming organs and certain disorders involving the immune mechanism: Secondary | ICD-10-CM

## 2024-07-01 DIAGNOSIS — E1159 Type 2 diabetes mellitus with other circulatory complications: Secondary | ICD-10-CM

## 2024-07-01 DIAGNOSIS — I25118 Atherosclerotic heart disease of native coronary artery with other forms of angina pectoris: Secondary | ICD-10-CM

## 2024-07-01 DIAGNOSIS — I10 Essential (primary) hypertension: Secondary | ICD-10-CM

## 2024-07-01 DIAGNOSIS — Z125 Encounter for screening for malignant neoplasm of prostate: Secondary | ICD-10-CM

## 2024-07-01 DIAGNOSIS — E1169 Type 2 diabetes mellitus with other specified complication: Secondary | ICD-10-CM

## 2024-07-01 DIAGNOSIS — Z0001 Encounter for general adult medical examination with abnormal findings: Secondary | ICD-10-CM

## 2024-07-01 LAB — CBC WITH DIFFERENTIAL/PLATELET
Basophils Absolute: 0.1 K/uL (ref 0.0–0.1)
Basophils Relative: 1.2 % (ref 0.0–3.0)
Eosinophils Absolute: 0.3 K/uL (ref 0.0–0.7)
Eosinophils Relative: 4.3 % (ref 0.0–5.0)
HCT: 43 % (ref 39.0–52.0)
Hemoglobin: 14.3 g/dL (ref 13.0–17.0)
Lymphocytes Relative: 18.8 % (ref 12.0–46.0)
Lymphs Abs: 1.3 K/uL (ref 0.7–4.0)
MCHC: 33.2 g/dL (ref 30.0–36.0)
MCV: 84.9 fl (ref 78.0–100.0)
Monocytes Absolute: 0.5 K/uL (ref 0.1–1.0)
Monocytes Relative: 7.8 % (ref 3.0–12.0)
Neutro Abs: 4.6 K/uL (ref 1.4–7.7)
Neutrophils Relative %: 67.9 % (ref 43.0–77.0)
Platelets: 182 K/uL (ref 150.0–400.0)
RBC: 5.07 Mil/uL (ref 4.22–5.81)
RDW: 14.4 % (ref 11.5–15.5)
WBC: 6.8 K/uL (ref 4.0–10.5)

## 2024-07-01 LAB — COMPREHENSIVE METABOLIC PANEL WITH GFR
ALT: 21 U/L (ref 0–53)
AST: 17 U/L (ref 0–37)
Albumin: 4.4 g/dL (ref 3.5–5.2)
Alkaline Phosphatase: 43 U/L (ref 39–117)
BUN: 10 mg/dL (ref 6–23)
CO2: 31 meq/L (ref 19–32)
Calcium: 9.4 mg/dL (ref 8.4–10.5)
Chloride: 103 meq/L (ref 96–112)
Creatinine, Ser: 0.83 mg/dL (ref 0.40–1.50)
GFR: 90.27 mL/min (ref >60.00)
Glucose, Bld: 102 mg/dL — ABNORMAL HIGH (ref 70–99)
Potassium: 3.9 meq/L (ref 3.5–5.1)
Sodium: 143 meq/L (ref 135–145)
Total Bilirubin: 0.6 mg/dL (ref 0.2–1.2)
Total Protein: 6.5 g/dL (ref 6.0–8.3)

## 2024-07-01 LAB — LIPID PANEL
Cholesterol: 79 mg/dL (ref 0–200)
HDL: 40.8 mg/dL (ref >39.00)
LDL Cholesterol: 6 mg/dL (ref 0–99)
NonHDL: 37.7
Total CHOL/HDL Ratio: 2
Triglycerides: 161 mg/dL — ABNORMAL HIGH (ref 0.0–149.0)
VLDL: 32.2 mg/dL (ref 0.0–40.0)

## 2024-07-01 LAB — PSA: PSA: 0.81 ng/mL (ref 0.10–4.00)

## 2024-07-01 MED ORDER — VALSARTAN 160 MG PO TABS: 160.0000 mg | ORAL_TABLET | Freq: Every day | ORAL | 3 refills | Status: AC

## 2024-07-01 NOTE — Assessment & Plan Note (Signed)
 History of CAD status post coronary artery bypass grafting 02/20/2006.  He has been asymptomatic since.

## 2024-07-01 NOTE — Assessment & Plan Note (Signed)
 BP Readings from Last 3 Encounters:  07/01/24 118/80  06/24/24 134/60  06/23/24 120/64  Advised by gastroenterologist to change losartan  due to chronic diarrhea Chronic diarrhea most likely secondary to diabetic medications However recommend trial of valsartan  instead New prescription for valsartan  160 mg sent to pharmacy of record today Prefer to stay with ARB due to renal protection Well-controlled diabetes with last hemoglobin A1c 6.8 Please continue:  - Metformin  ER 1000 mg 2x a day, with meals - Jardiance  25 mg before b'fast - Ozempic  2 mg weekly  Was also started on low-dose glipizide  2.5 mg daily

## 2024-07-01 NOTE — Assessment & Plan Note (Signed)
 Stable chronic conditions Continue Jardiance , metformin , and Ozempic  as detailed above Continue atorvastatin  40 mg daily Cardiovascular risks associated with diabetes and dyslipidemia discussed Diet and nutrition discussed

## 2024-07-01 NOTE — Patient Instructions (Signed)
 Health Maintenance, Male  Adopting a healthy lifestyle and getting preventive care are important in promoting health and wellness. Ask your health care provider about:  The right schedule for you to have regular tests and exams.  Things you can do on your own to prevent diseases and keep yourself healthy.  What should I know about diet, weight, and exercise?  Eat a healthy diet    Eat a diet that includes plenty of vegetables, fruits, low-fat dairy products, and lean protein.  Do not eat a lot of foods that are high in solid fats, added sugars, or sodium.  Maintain a healthy weight  Body mass index (BMI) is a measurement that can be used to identify possible weight problems. It estimates body fat based on height and weight. Your health care provider can help determine your BMI and help you achieve or maintain a healthy weight.  Get regular exercise  Get regular exercise. This is one of the most important things you can do for your health. Most adults should:  Exercise for at least 150 minutes each week. The exercise should increase your heart rate and make you sweat (moderate-intensity exercise).  Do strengthening exercises at least twice a week. This is in addition to the moderate-intensity exercise.  Spend less time sitting. Even light physical activity can be beneficial.  Watch cholesterol and blood lipids  Have your blood tested for lipids and cholesterol at 68 years of age, then have this test every 5 years.  You may need to have your cholesterol levels checked more often if:  Your lipid or cholesterol levels are high.  You are older than 68 years of age.  You are at high risk for heart disease.  What should I know about cancer screening?  Many types of cancers can be detected early and may often be prevented. Depending on your health history and family history, you may need to have cancer screening at various ages. This may include screening for:  Colorectal cancer.  Prostate cancer.  Skin cancer.  Lung  cancer.  What should I know about heart disease, diabetes, and high blood pressure?  Blood pressure and heart disease  High blood pressure causes heart disease and increases the risk of stroke. This is more likely to develop in people who have high blood pressure readings or are overweight.  Talk with your health care provider about your target blood pressure readings.  Have your blood pressure checked:  Every 3-5 years if you are 24-52 years of age.  Every year if you are 3 years old or older.  If you are between the ages of 60 and 72 and are a current or former smoker, ask your health care provider if you should have a one-time screening for abdominal aortic aneurysm (AAA).  Diabetes  Have regular diabetes screenings. This checks your fasting blood sugar level. Have the screening done:  Once every three years after age 66 if you are at a normal weight and have a low risk for diabetes.  More often and at a younger age if you are overweight or have a high risk for diabetes.  What should I know about preventing infection?  Hepatitis B  If you have a higher risk for hepatitis B, you should be screened for this virus. Talk with your health care provider to find out if you are at risk for hepatitis B infection.  Hepatitis C  Blood testing is recommended for:  Everyone born from [birth year] through 1965.  Anyone  with known risk factors for hepatitis C.  Sexually transmitted infections (STIs)  You should be screened each year for STIs, including gonorrhea and chlamydia, if:  You are sexually active and are younger than 68 years of age.  You are older than 68 years of age and your health care provider tells you that you are at risk for this type of infection.  Your sexual activity has changed since you were last screened, and you are at increased risk for chlamydia or gonorrhea. Ask your health care provider if you are at risk.  Ask your health care provider about whether you are at high risk for HIV. Your health care provider  may recommend a prescription medicine to help prevent HIV infection. If you choose to take medicine to prevent HIV, you should first get tested for HIV. You should then be tested every 3 months for as long as you are taking the medicine.  Follow these instructions at home:  Alcohol use  Do not drink alcohol if your health care provider tells you not to drink.  If you drink alcohol:  Limit how much you have to 0-2 drinks a day.  Know how much alcohol is in your drink. In the [place] one drink equals one 12 oz bottle of beer (355 mL), one 5 oz glass of wine (148 mL), or one 1 oz glass of hard liquor (44 mL).  Lifestyle  Do not use any products that contain nicotine or tobacco. These products include cigarettes, chewing tobacco, and vaping devices, such as e-cigarettes. If you need help quitting, ask your health care provider.  Do not use street drugs.  Do not share needles.  Ask your health care provider for help if you need support or information about quitting drugs.  General instructions  Schedule regular health, dental, and eye exams.  Stay current with your vaccines.  Tell your health care provider if:  You often feel depressed.  You have ever been abused or do not feel safe at home.  Summary  Adopting a healthy lifestyle and getting preventive care are important in promoting health and wellness.  Follow your health care provider's instructions about healthy diet, exercising, and getting tested or screened for diseases.  Follow your health care provider's instructions on monitoring your cholesterol and blood pressure.  This information is not intended to replace advice given to you by your health care provider. Make sure you discuss any questions you have with your health care provider.  Document Revised: 11/29/2020 Document Reviewed: 11/29/2020  Elsevier Patient Education  2024 [employer].

## 2024-07-01 NOTE — Progress Notes (Signed)
 [name] Love 68 y.o.   Chief Complaint  Patient presents with   Annual Exam    HISTORY OF PRESENT ILLNESS: This is a 68 y.o. male here for annual exam and follow-up of multiple chronic medical conditions Still having some erectile dysfunction issues No other complaints or medical concerns today Has history of diabetes.  Sees endocrinologist on a regular basis Last office visit assessment and plan as follows: ASSESSMENT: 1. DM2, non-insulin -dependent, uncontrolled, without complications - CAD - s/p CABG in 2007 - CKD - PN - DR   2. HL   3. Overweight   PLAN:  1. Patient with longstanding, fairly well-controlled type 2 diabetes, on oral antidiabetic regimen with metformin  and SGLT2 inhibitor along with weekly GLP-1 receptor agonist, with a higher HbA1c at last visit, 7.1%, increased from 6.5%.  Sugars were increasing less after breakfast compared to the previous visit, but they were more fluctuating later in the day, with still most of the values within the target range, possibly due to traveling.  We did not change his regimen at that time.  CGM interpretation: -At today's visit, we reviewed his CGM downloads: It appears that 74% of values are in target range (goal >70%), while 26% are higher than 180 (goal <25%), and 0% are lower than 70 (goal <4%).  The calculated average blood sugar is 159.  The projected HbA1c for the next 3 months (GMI) is 7.1%. -Reviewing the CGM trends, sugars appear to be improved overnight and they are increasing after waking up, sometimes even after his shower.  They remain elevated after each meal.  We discussed about adding a low-dose glipizide  extended release before the first meal of the day, and otherwise continue the same regimen.  I am hoping we can stop this after the holidays. - I suggested to:  Patient Instructions  Please continue:  - Metformin  ER 1000 mg 2x a day, with meals - Jardiance  25 mg before b'fast - Ozempic  2 mg weekly     Please start: - Glipizide  ER 2.5 mg daily before the first meal of the day   Please return in 4 months.   - we checked his HbA1c: 6.8% (lower) - advised to check sugars at different times of the day - 4x a day, rotating check times - advised for yearly eye exams >> he is UTD -He has significant onychodystrophy and decreased pulses in his legs.  Also, some atrophic changes of the skin in his feet.  At last visit, I recommended to see podiatry and he saw Dr. Zan. He continues to follow-up with her, last visit in 05/2024. Previously. -He had a slightly elevated ACR at last check.  Will recheck this today. - return to clinic in 4 months   2.  Hyperlipidemia - Latest lipid panel reviewed: LDL at goal, high triglycerides and low HDL: Recent Labs       Lab Results  Component Value Date    CHOL 77 12/31/2023    HDL 32.90 (L) 12/31/2023    LDLCALC 2 12/31/2023    LDLDIRECT 21.0 07/06/2022    TRIG 212.0 (H) 12/31/2023    CHOLHDL 2 12/31/2023    - On Lipitor 40 mg daily and omega-3 fatty acids, without side effects   3.  Overweight - continue SGLT 2 inhibitor and GLP-1 receptor agonist which should also help with weight loss - He gained 6 pounds before last visit and lost 2 pounds since last visit     Component     Latest  Ref Rng 06/23/2024  Hemoglobin A1C     4.0 - 5.6 % 6.8 !   Microalb, Ur     mg/dL 4.3   MICROALB/CREAT RATIO     <30 mg/g creat 52 (H)   Creatinine, Urine     20 - 320 mg/dL 83   Urine ACR slightly higher than before.  He is on Jardiance  and losartan .  I would like to repeat this at next visit.  If it continues to trend up, may need an increase in losartan  dose.  For now, due to the lower diastolic blood pressure, I would not recommend this.   Lela Fendt, MD PhD [facility] Endocrinology Lab Results  Component Value Date   HGBA1C 6.8 (A) 06/23/2024     HPI   Prior to Admission medications   Medication Sig Start Date End Date Taking? Authorizing  Provider  acetaminophen  (TYLENOL ) 500 MG tablet Take 1,000 mg by mouth every 8 (eight) hours as needed for moderate pain.   Yes [provider]  aspirin  EC 81 MG tablet Take 1 tablet (81 mg total) by mouth daily. Swallow whole. 03/15/23  Yes Dann Candyce RAMAN, MD  atorvastatin  (LIPITOR) 40 MG tablet Take 1 tablet by mouth once daily 02/04/24  Yes Walker, Caitlin S, NP  budesonide -formoterol  (SYMBICORT ) 80-4.5 MCG/ACT inhaler Inhale 2 puffs into the lungs 2 (two) times daily as needed (for asthma). 03/10/24  Yes Meade Verdon RAMAN, MD  empagliflozin  (JARDIANCE ) 25 MG TABS tablet Take 1 tablet (25 mg total) by mouth daily before breakfast. 01/21/24  Yes Fendt Lela, MD  fluticasone  (FLONASE ) 50 MCG/ACT nasal spray Place 1 spray into both nostrils daily. 03/10/24  Yes Meade Verdon RAMAN, MD  glipiZIDE  (GLUCOTROL  XL) 2.5 MG 24 hr tablet Take 1 tablet (2.5 mg total) by mouth daily with breakfast. 06/23/24  Yes Fendt Lela, MD  losartan  (COZAAR ) 50 MG tablet Take 1 tablet by mouth once daily 06/04/23  Yes Lorianna Spadaccini, Emil Schanz, MD  Magnesium  Glycinate 665 MG CAPS Take 1 tablet by mouth in the morning and at bedtime.   Yes [provider]  metFORMIN  (GLUCOPHAGE ) 1000 MG tablet Take 1 tablet (1,000 mg total) by mouth 2 (two) times daily with a meal. 12/10/23  Yes Fendt Lela, MD  Omega-3 Fatty Acids (FISH OIL ) 1000 MG CAPS Take 1,000 mg by mouth in the morning and at bedtime.   Yes [provider]  omeprazole  (PRILOSEC) 20 MG capsule Take 1 capsule (20 mg total) by mouth daily. 04/29/24  Yes Stacia Glendia BRAVO, MD  Semaglutide , 2 MG/DOSE, (OZEMPIC , 2 MG/DOSE,) 8 MG/3ML SOPN Inject 2 mg into the skin once a week. 01/21/24  Yes Fendt Lela, MD    No Known Allergies  Patient Active Problem List   Diagnosis Date Noted   Spinal stenosis at L4-L5 level 06/08/2022   Lumbar spondylosis 05/21/2022   Lumbar radiculopathy 08/05/2021   Degeneration of thoracic  intervertebral disc 06/29/2020   Decreased calculated GFR 08/08/2017   Erectile dysfunction 09/15/2013   Nonspecific abnormal electrocardiogram (ECG) (EKG) 06/10/2013   Poorly controlled type 2 diabetes mellitus with circulatory disorder (HCC) 09/12/2012   Coronary artery disease 07/14/2011   History of coronary artery bypass surgery 07/14/2011   Dyslipidemia associated with type 2 diabetes mellitus (HCC) 02/23/2009   Hypertension associated with diabetes (HCC) 02/23/2009    Past Medical History:  Diagnosis Date   Anemia    Arthritis    Cancer (HCC)    skin cancer on left posterior neck -  excised   Cataract    Coronary artery disease    post CABG x3 in 2007   Diabetes mellitus    type 2   GERD (gastroesophageal reflux disease)    Headache    no current problem   Hearing loss    some hearing loss bilateral - no hearing aids   History of kidney stones 1990   passed stone   Hyperlipidemia    Hypertension    Obesity     Past Surgical History:  Procedure Laterality Date   CARDIAC CATHETERIZATION  EF= 45-50%   EF= 45-50% -- Three-vessel coronary artery disease with high-grade lesions in the proximal left anterior descending artery and multiple lesions in the right coronary -- Low normal ejection fraction -- Stable exertional angina -- Toribio Fuel, M.D. [facility]   COLONOSCOPY  09/26/2021   CORONARY ARTERY BYPASS GRAFT  02/19/2006    x3 (left internal mammary artery to LAD, left radial artery to posterior descending, saphenous vein graft to first diagonal), endoscopic vein harvest right leg -- SURGEON:  Elspeth BROCKS. Kerrin, M.D.   HEMI-MICRODISCECTOMY LUMBAR LAMINECTOMY LEVEL 1 Left 06/08/2022   Procedure: Micro hemi laminectomy Lumbar Five-Sacral One left;  Surgeon: Duwayne Purchase, MD;  Location: [facility] OR;  Service: Orthopedics;  Laterality: Left;  90 mins 3 C-Bed   HERNIA REPAIR     at 68 y/o   SHOULDER ARTHROSCOPY Right 2007   tripple bypass     UPPER GASTROINTESTINAL  ENDOSCOPY  09/26/2021    Social History   Socioeconomic History   Marital status: Married    Spouse name: Idell   Number of children: 3   Years of education: Not on file   Highest education level: Bachelor's degree (e.g., BA, AB, BS)  Occupational History   Occupation: retired  Tobacco Use   Smoking status: Never   Smokeless tobacco: Never  Vaping Use   Vaping status: Never Used  Substance and Sexual Activity   Alcohol use: Yes    Alcohol/week: 1.0 standard drink of alcohol    Types: 1 Standard drinks or equivalent per week    Comment: 1 drink monthly   Drug use: Never   Sexual activity: Yes  Other Topics Concern   Not on file  Social History Narrative   Lives with wife and 1 cat/2025   Social Drivers of Health   Financial Resource Strain: Low Risk  (07/01/2024)   Overall Financial Resource Strain (CARDIA)    Difficulty of Paying Living Expenses: Not hard at all  Food Insecurity: No Food Insecurity (07/01/2024)   Hunger Vital Sign    Worried About Running Out of Food in the Last Year: Never true    Ran Out of Food in the Last Year: Never true  Transportation Needs: No Transportation Needs (07/01/2024)   PRAPARE - Administrator, Civil Service (Medical): No    Lack of Transportation (Non-Medical): No  Physical Activity: Insufficiently Active (07/01/2024)   Exercise Vital Sign    Days of Exercise per Week: 2 days    Minutes of Exercise per Session: 30 min  Stress: No Stress Concern Present (07/01/2024)   [employer] of Occupational Health - Occupational Stress Questionnaire    Feeling of Stress: Not at all  Social Connections: Socially Integrated (07/01/2024)   Social Connection and Isolation Panel    Frequency of Communication with Friends and Family: Three times a week    Frequency of Social Gatherings with Friends and Family: Twice a week  Attends Religious Services: 1 to 4 times per year    Active Member of Clubs or Organizations: Yes    Attends  Banker Meetings: More than 4 times per year    Marital Status: Married  Catering Manager Violence: Not At Risk (12/11/2023)   Humiliation, Afraid, Rape, and Kick questionnaire    Fear of Current or Ex-Partner: No    Emotionally Abused: No    Physically Abused: No    Sexually Abused: No    Family History  Problem Relation Age of Onset   Heart disease Mother    Colon cancer Mother        63   Hypertension Mother    Cancer Father        type unknown   Cancer - Lung Father    Heart attack Maternal Grandfather 65   Kidney disease Maternal Aunt    Diabetes Maternal Uncle    Colon polyps Neg Hx    Esophageal cancer Neg Hx    Rectal cancer Neg Hx    Stomach cancer Neg Hx      Review of Systems  Constitutional: Negative.  Negative for chills and fever.  HENT: Negative.  Negative for congestion and sore throat.   Respiratory: Negative.  Negative for cough and shortness of breath.   Cardiovascular: Negative.  Negative for chest pain and palpitations.  Gastrointestinal:  Positive for diarrhea.  Genitourinary: Negative.  Negative for dysuria and urgency.       Erectile dysfunction.  Has seen urologist in the past  Musculoskeletal:  Positive for back pain.  Skin: Negative.  Negative for rash.  Neurological: Negative.  Negative for dizziness and headaches.  All other systems reviewed and are negative.   Vitals:   07/01/24 1053  BP: 118/80  Pulse: 81  Temp: 97.7 F (36.5 C)  SpO2: 97%    Physical Exam Vitals reviewed.  Constitutional:      Appearance: Normal appearance.  HENT:     Head: Normocephalic.     Right Ear: Tympanic membrane, ear canal and external ear normal.     Left Ear: Tympanic membrane, ear canal and external ear normal.     Mouth/Throat:     Mouth: Mucous membranes are moist.     Pharynx: Oropharynx is clear.  Eyes:     Extraocular Movements: Extraocular movements intact.     Conjunctiva/sclera: Conjunctivae normal.     Pupils: Pupils are  equal, round, and reactive to light.  Cardiovascular:     Rate and Rhythm: Normal rate and regular rhythm.     Pulses: Normal pulses.     Heart sounds: Normal heart sounds.  Pulmonary:     Effort: Pulmonary effort is normal.     Breath sounds: Normal breath sounds.  Abdominal:     Palpations: Abdomen is soft.     Tenderness: There is no abdominal tenderness.  Musculoskeletal:     Cervical back: No tenderness.     Right lower leg: No edema.     Left lower leg: No edema.  Lymphadenopathy:     Cervical: No cervical adenopathy.  Skin:    General: Skin is warm and dry.     Capillary Refill: Capillary refill takes less than 2 seconds.  Neurological:     General: No focal deficit present.     Mental Status: He is alert and oriented to person, place, and time.  Psychiatric:        Mood and Affect: Mood normal.  Behavior: Behavior normal.      ASSESSMENT & PLAN: Problem List Items Addressed This Visit       Cardiovascular and Mediastinum   Hypertension associated with diabetes (HCC)   BP Readings from Last 3 Encounters:  07/01/24 118/80  06/24/24 134/60  06/23/24 120/64  Advised by gastroenterologist to change losartan  due to chronic diarrhea Chronic diarrhea most likely secondary to diabetic medications However recommend trial of valsartan  instead New prescription for valsartan  160 mg sent to pharmacy of record today Prefer to stay with ARB due to renal protection Well-controlled diabetes with last hemoglobin A1c 6.8 Please continue:  - Metformin  ER 1000 mg 2x a day, with meals - Jardiance  25 mg before b'fast - Ozempic  2 mg weekly  Was also started on low-dose glipizide  2.5 mg daily        Relevant Medications   valsartan  (DIOVAN ) 160 MG tablet   Other Relevant Orders   Comprehensive metabolic panel with GFR   CBC with Differential/Platelet   Lipid panel   Coronary artery disease   History of CAD status post coronary artery bypass grafting 02/20/2006. He has  been asymptomatic since.       Relevant Medications   valsartan  (DIOVAN ) 160 MG tablet     Endocrine   Dyslipidemia associated with type 2 diabetes mellitus (HCC)   Stable chronic conditions Continue Jardiance , metformin , and Ozempic  as detailed above Continue atorvastatin  40 mg daily Cardiovascular risks associated with diabetes and dyslipidemia discussed Diet and nutrition discussed      Relevant Medications   valsartan  (DIOVAN ) 160 MG tablet   Other Relevant Orders   Comprehensive metabolic panel with GFR   Lipid panel   Other Visit Diagnoses       Encounter for general adult medical examination with abnormal findings    -  Primary   Relevant Orders   Comprehensive metabolic panel with GFR   CBC with Differential/Platelet   Lipid panel   PSA     Essential hypertension       Relevant Medications   valsartan  (DIOVAN ) 160 MG tablet     Screening for deficiency anemia       Relevant Orders   CBC with Differential/Platelet     Screening for endocrine, metabolic and immunity disorder       Relevant Orders   Comprehensive metabolic panel with GFR     Screening for prostate cancer       Relevant Orders   PSA      Modifiable risk factors discussed with patient. Anticipatory guidance according to age provided. The following topics were also discussed: Social Determinants of Health Smoking.  Non-smoker Diet and nutrition Benefits of exercise Cancer screening and review of most recent colonoscopy report from 2023 Vaccinations review and recommendations Cardiovascular risk assessment and need for blood work Review of multiple chronic medical conditions under management Review of all medications Mental health including depression and anxiety Fall and accident prevention  Patient Instructions  Health Maintenance, Male Adopting a healthy lifestyle and getting preventive care are important in promoting health and wellness. Ask your health care provider about: The right  schedule for you to have regular tests and exams. Things you can do on your own to prevent diseases and keep yourself healthy. What should I know about diet, weight, and exercise? Eat a healthy diet  Eat a diet that includes plenty of vegetables, fruits, low-fat dairy products, and lean protein. Do not eat a lot of foods that are high in solid fats,  added sugars, or sodium. Maintain a healthy weight Body mass index (BMI) is a measurement that can be used to identify possible weight problems. It estimates body fat based on height and weight. Your health care provider can help determine your BMI and help you achieve or maintain a healthy weight. Get regular exercise Get regular exercise. This is one of the most important things you can do for your health. Most adults should: Exercise for at least 150 minutes each week. The exercise should increase your heart rate and make you sweat (moderate-intensity exercise). Do strengthening exercises at least twice a week. This is in addition to the moderate-intensity exercise. Spend less time sitting. Even light physical activity can be beneficial. Watch cholesterol and blood lipids Have your blood tested for lipids and cholesterol at 68 years of age, then have this test every 5 years. You may need to have your cholesterol levels checked more often if: Your lipid or cholesterol levels are high. You are older than 68 years of age. You are at high risk for heart disease. What should I know about cancer screening? Many types of cancers can be detected early and may often be prevented. Depending on your health history and family history, you may need to have cancer screening at various ages. This may include screening for: Colorectal cancer. Prostate cancer. Skin cancer. Lung cancer. What should I know about heart disease, diabetes, and high blood pressure? Blood pressure and heart disease High blood pressure causes heart disease and increases the risk of  stroke. This is more likely to develop in people who have high blood pressure readings or are overweight. Talk with your health care provider about your target blood pressure readings. Have your blood pressure checked: Every 3-5 years if you are 53-51 years of age. Every year if you are 64 years old or older. If you are between the ages of 20 and 49 and are a current or former smoker, ask your health care provider if you should have a one-time screening for abdominal aortic aneurysm (AAA). Diabetes Have regular diabetes screenings. This checks your fasting blood sugar level. Have the screening done: Once every three years after age 47 if you are at a normal weight and have a low risk for diabetes. More often and at a younger age if you are overweight or have a high risk for diabetes. What should I know about preventing infection? Hepatitis B If you have a higher risk for hepatitis B, you should be screened for this virus. Talk with your health care provider to find out if you are at risk for hepatitis B infection. Hepatitis C Blood testing is recommended for: Everyone born from [birth year] through 1965. Anyone with known risk factors for hepatitis C. Sexually transmitted infections (STIs) You should be screened each year for STIs, including gonorrhea and chlamydia, if: You are sexually active and are younger than 68 years of age. You are older than 68 years of age and your health care provider tells you that you are at risk for this type of infection. Your sexual activity has changed since you were last screened, and you are at increased risk for chlamydia or gonorrhea. Ask your health care provider if you are at risk. Ask your health care provider about whether you are at high risk for HIV. Your health care provider may recommend a prescription medicine to help prevent HIV infection. If you choose to take medicine to prevent HIV, you should first get tested for HIV. You should  then be tested every 3  months for as long as you are taking the medicine. Follow these instructions at home: Alcohol use Do not drink alcohol if your health care provider tells you not to drink. If you drink alcohol: Limit how much you have to 0-2 drinks a day. Know how much alcohol is in your drink. In the [place] one drink equals one 12 oz bottle of beer (355 mL), one 5 oz glass of wine (148 mL), or one 1 oz glass of hard liquor (44 mL). Lifestyle Do not use any products that contain nicotine or tobacco. These products include cigarettes, chewing tobacco, and vaping devices, such as e-cigarettes. If you need help quitting, ask your health care provider. Do not use street drugs. Do not share needles. Ask your health care provider for help if you need support or information about quitting drugs. General instructions Schedule regular health, dental, and eye exams. Stay current with your vaccines. Tell your health care provider if: You often feel depressed. You have ever been abused or do not feel safe at home. Summary Adopting a healthy lifestyle and getting preventive care are important in promoting health and wellness. Follow your health care provider's instructions about healthy diet, exercising, and getting tested or screened for diseases. Follow your health care provider's instructions on monitoring your cholesterol and blood pressure. This information is not intended to replace advice given to you by your health care provider. Make sure you discuss any questions you have with your health care provider. Document Revised: 11/29/2020 Document Reviewed: 11/29/2020 Elsevier Patient Education  2024 [employer]       Emil Schaumann, MD [facility]

## 2024-07-24 ENCOUNTER — Other Ambulatory Visit: Payer: Self-pay | Admitting: Gastroenterology

## 2024-09-01 ENCOUNTER — Ambulatory Visit: Admitting: Cardiovascular Disease

## 2024-10-30 ENCOUNTER — Ambulatory Visit: Admitting: Internal Medicine

## 2024-12-11 ENCOUNTER — Ambulatory Visit
